# Patient Record
Sex: Male | Born: 1940
Health system: Southern US, Community
[De-identification: ages and names within clinical notes are randomized; demographics above are authoritative.]

## PROBLEM LIST (undated history)

## (undated) DIAGNOSIS — R7989 Other specified abnormal findings of blood chemistry: Secondary | ICD-10-CM

## (undated) DIAGNOSIS — T7840XA Allergy, unspecified, initial encounter: Secondary | ICD-10-CM

## (undated) DIAGNOSIS — E538 Deficiency of other specified B group vitamins: Secondary | ICD-10-CM

## (undated) DIAGNOSIS — B9681 Helicobacter pylori [H. pylori] as the cause of diseases classified elsewhere: Secondary | ICD-10-CM

## (undated) DIAGNOSIS — I482 Chronic atrial fibrillation, unspecified: Secondary | ICD-10-CM

## (undated) DIAGNOSIS — M4800 Spinal stenosis, site unspecified: Secondary | ICD-10-CM

## (undated) DIAGNOSIS — K5790 Diverticulosis of intestine, part unspecified, without perforation or abscess without bleeding: Secondary | ICD-10-CM

## (undated) DIAGNOSIS — I2119 ST elevation (STEMI) myocardial infarction involving other coronary artery of inferior wall: Secondary | ICD-10-CM

## (undated) DIAGNOSIS — R29898 Other symptoms and signs involving the musculoskeletal system: Secondary | ICD-10-CM

## (undated) DIAGNOSIS — N419 Inflammatory disease of prostate, unspecified: Secondary | ICD-10-CM

## (undated) DIAGNOSIS — B029 Zoster without complications: Secondary | ICD-10-CM

## (undated) DIAGNOSIS — K2970 Gastritis, unspecified, without bleeding: Secondary | ICD-10-CM

## (undated) DIAGNOSIS — M109 Gout, unspecified: Secondary | ICD-10-CM

## (undated) DIAGNOSIS — E039 Hypothyroidism, unspecified: Secondary | ICD-10-CM

## (undated) DIAGNOSIS — N529 Male erectile dysfunction, unspecified: Secondary | ICD-10-CM

## (undated) DIAGNOSIS — Z860100 Personal history of colon polyps, unspecified: Secondary | ICD-10-CM

## (undated) DIAGNOSIS — E559 Vitamin D deficiency, unspecified: Secondary | ICD-10-CM

## (undated) DIAGNOSIS — J449 Chronic obstructive pulmonary disease, unspecified: Secondary | ICD-10-CM

## (undated) DIAGNOSIS — H9190 Unspecified hearing loss, unspecified ear: Secondary | ICD-10-CM

## (undated) DIAGNOSIS — I10 Essential (primary) hypertension: Secondary | ICD-10-CM

## (undated) DIAGNOSIS — N200 Calculus of kidney: Secondary | ICD-10-CM

## (undated) DIAGNOSIS — E119 Type 2 diabetes mellitus without complications: Secondary | ICD-10-CM

## (undated) DIAGNOSIS — F419 Anxiety disorder, unspecified: Secondary | ICD-10-CM

## (undated) DIAGNOSIS — K5190 Ulcerative colitis, unspecified, without complications: Secondary | ICD-10-CM

## (undated) DIAGNOSIS — J45909 Unspecified asthma, uncomplicated: Secondary | ICD-10-CM

## (undated) DIAGNOSIS — E785 Hyperlipidemia, unspecified: Secondary | ICD-10-CM

## (undated) DIAGNOSIS — K219 Gastro-esophageal reflux disease without esophagitis: Secondary | ICD-10-CM

## (undated) DIAGNOSIS — Z951 Presence of aortocoronary bypass graft: Secondary | ICD-10-CM

## (undated) DIAGNOSIS — J8410 Pulmonary fibrosis, unspecified: Secondary | ICD-10-CM

## (undated) DIAGNOSIS — M5416 Radiculopathy, lumbar region: Secondary | ICD-10-CM

## (undated) DIAGNOSIS — I2510 Atherosclerotic heart disease of native coronary artery without angina pectoris: Secondary | ICD-10-CM

## (undated) DIAGNOSIS — IMO0002 Reserved for concepts with insufficient information to code with codable children: Secondary | ICD-10-CM

## (undated) DIAGNOSIS — R945 Abnormal results of liver function studies: Secondary | ICD-10-CM

## (undated) DIAGNOSIS — D509 Iron deficiency anemia, unspecified: Secondary | ICD-10-CM

## (undated) DIAGNOSIS — J42 Unspecified chronic bronchitis: Secondary | ICD-10-CM

## (undated) DIAGNOSIS — K2270 Barrett's esophagus without dysplasia: Secondary | ICD-10-CM

## (undated) DIAGNOSIS — Z9861 Coronary angioplasty status: Secondary | ICD-10-CM

## (undated) HISTORY — DX: Unspecified hearing loss, unspecified ear: H91.90

## (undated) HISTORY — DX: Hypothyroidism, unspecified: E03.9

## (undated) HISTORY — DX: Diverticulosis of intestine, part unspecified, without perforation or abscess without bleeding: K57.90

## (undated) HISTORY — DX: Pulmonary fibrosis, unspecified: J84.10

## (undated) HISTORY — DX: Barrett's esophagus without dysplasia: K22.70

## (undated) HISTORY — DX: Hyperlipidemia, unspecified: E78.5

## (undated) HISTORY — DX: Zoster without complications: B02.9

## (undated) HISTORY — DX: Coronary angioplasty status: Z98.61

## (undated) HISTORY — PX: BLEPHAROPLASTY: SUR158

## (undated) HISTORY — DX: Gout, unspecified: M10.9

## (undated) HISTORY — DX: Atherosclerotic heart disease of native coronary artery without angina pectoris: I25.10

## (undated) HISTORY — DX: Calculus of kidney: N20.0

## (undated) HISTORY — DX: Unspecified asthma, uncomplicated: J45.909

## (undated) HISTORY — DX: Other symptoms and signs involving the musculoskeletal system: R29.898

## (undated) HISTORY — DX: Personal history of colon polyps, unspecified: Z86.0100

## (undated) HISTORY — DX: Reserved for concepts with insufficient information to code with codable children: IMO0002

## (undated) HISTORY — DX: Radiculopathy, lumbar region: M54.16

## (undated) HISTORY — DX: Ulcerative colitis, unspecified, without complications: K51.90

## (undated) HISTORY — DX: Essential (primary) hypertension: I10

## (undated) HISTORY — PX: COLONOSCOPY: SHX174

## (undated) HISTORY — DX: Iron deficiency anemia, unspecified: D50.9

## (undated) HISTORY — DX: Other specified abnormal findings of blood chemistry: R79.89

## (undated) HISTORY — DX: Anxiety disorder, unspecified: F41.9

## (undated) HISTORY — DX: ST elevation (STEMI) myocardial infarction involving other coronary artery of inferior wall: I21.19

## (undated) HISTORY — DX: Chronic atrial fibrillation, unspecified: I48.20

## (undated) HISTORY — DX: Abnormal results of liver function studies: R94.5

## (undated) HISTORY — PX: CHOLECYSTECTOMY: SHX55

## (undated) HISTORY — DX: Vitamin D deficiency, unspecified: E55.9

## (undated) HISTORY — PX: FLEXIBLE SIGMOIDOSCOPY: SHX1649

## (undated) HISTORY — DX: Male erectile dysfunction, unspecified: N52.9

## (undated) HISTORY — DX: Spinal stenosis, site unspecified: M48.00

## (undated) HISTORY — PX: ESOPHAGOGASTRODUODENOSCOPY: SHX1529

## (undated) HISTORY — DX: Helicobacter pylori (H. pylori) as the cause of diseases classified elsewhere: B96.81

## (undated) HISTORY — DX: Personal history of colonic polyps: Z86.010

## (undated) HISTORY — DX: Gastro-esophageal reflux disease without esophagitis: K21.9

## (undated) HISTORY — DX: Presence of aortocoronary bypass graft: Z95.1

## (undated) HISTORY — DX: Allergy, unspecified, initial encounter: T78.40XA

## (undated) HISTORY — DX: Type 2 diabetes mellitus without complications: E11.9

## (undated) HISTORY — DX: Deficiency of other specified B group vitamins: E53.8

## (undated) HISTORY — DX: Chronic obstructive pulmonary disease, unspecified: J44.9

## (undated) HISTORY — DX: Inflammatory disease of prostate, unspecified: N41.9

## (undated) HISTORY — DX: Unspecified chronic bronchitis: J42

## (undated) HISTORY — DX: Gastritis, unspecified, without bleeding: K29.70

---

## 1985-07-20 DIAGNOSIS — I2510 Atherosclerotic heart disease of native coronary artery without angina pectoris: Secondary | ICD-10-CM

## 1985-07-20 DIAGNOSIS — Z9861 Coronary angioplasty status: Secondary | ICD-10-CM

## 1985-07-20 HISTORY — DX: Atherosclerotic heart disease of native coronary artery without angina pectoris: Z98.61

## 1985-07-20 HISTORY — DX: Atherosclerotic heart disease of native coronary artery without angina pectoris: I25.10

## 1985-10-30 DIAGNOSIS — I2119 ST elevation (STEMI) myocardial infarction involving other coronary artery of inferior wall: Secondary | ICD-10-CM

## 1995-07-21 DIAGNOSIS — Z951 Presence of aortocoronary bypass graft: Secondary | ICD-10-CM

## 1995-07-21 HISTORY — DX: Presence of aortocoronary bypass graft: Z95.1

## 1995-11-18 HISTORY — PX: CORONARY ARTERY BYPASS GRAFT: SHX141

## 1995-11-18 HISTORY — PX: CARDIAC CATHETERIZATION: SHX172

## 1996-02-16 ENCOUNTER — Encounter: Payer: Self-pay | Admitting: Gastroenterology

## 2000-04-28 ENCOUNTER — Encounter (INDEPENDENT_AMBULATORY_CARE_PROVIDER_SITE_OTHER): Payer: Self-pay | Admitting: Specialist

## 2000-04-28 ENCOUNTER — Other Ambulatory Visit: Admission: RE | Admit: 2000-04-28 | Discharge: 2000-04-28 | Payer: Self-pay | Admitting: Gastroenterology

## 2001-01-12 ENCOUNTER — Encounter: Payer: Self-pay | Admitting: Cardiology

## 2001-01-12 ENCOUNTER — Ambulatory Visit (HOSPITAL_COMMUNITY): Admission: RE | Admit: 2001-01-12 | Discharge: 2001-01-12 | Payer: Self-pay | Admitting: Cardiology

## 2003-06-01 ENCOUNTER — Encounter: Admission: RE | Admit: 2003-06-01 | Discharge: 2003-06-01 | Payer: Self-pay | Admitting: Family Medicine

## 2003-07-03 ENCOUNTER — Encounter: Payer: Self-pay | Admitting: Cardiology

## 2003-07-03 HISTORY — PX: NM MYOCAR PERF EJECTION FRACTION: HXRAD630

## 2003-07-21 HISTORY — PX: NM MYOVIEW LTD: HXRAD82

## 2003-08-13 ENCOUNTER — Encounter: Admission: RE | Admit: 2003-08-13 | Discharge: 2003-08-13 | Payer: Self-pay | Admitting: Cardiology

## 2003-08-17 ENCOUNTER — Encounter: Payer: Self-pay | Admitting: Cardiology

## 2003-08-17 ENCOUNTER — Ambulatory Visit (HOSPITAL_COMMUNITY): Admission: RE | Admit: 2003-08-17 | Discharge: 2003-08-17 | Payer: Self-pay | Admitting: Cardiology

## 2003-08-17 HISTORY — PX: CARDIAC CATHETERIZATION: SHX172

## 2004-06-26 ENCOUNTER — Ambulatory Visit: Payer: Self-pay | Admitting: Gastroenterology

## 2004-10-16 ENCOUNTER — Ambulatory Visit (HOSPITAL_COMMUNITY): Admission: RE | Admit: 2004-10-16 | Discharge: 2004-10-16 | Payer: Self-pay | Admitting: Ophthalmology

## 2004-12-23 ENCOUNTER — Ambulatory Visit (HOSPITAL_COMMUNITY): Admission: RE | Admit: 2004-12-23 | Discharge: 2004-12-23 | Payer: Self-pay | Admitting: Ophthalmology

## 2005-08-04 ENCOUNTER — Ambulatory Visit: Payer: Self-pay | Admitting: Gastroenterology

## 2005-08-12 ENCOUNTER — Ambulatory Visit: Payer: Self-pay | Admitting: Gastroenterology

## 2005-08-12 ENCOUNTER — Encounter (INDEPENDENT_AMBULATORY_CARE_PROVIDER_SITE_OTHER): Payer: Self-pay | Admitting: *Deleted

## 2007-02-24 ENCOUNTER — Ambulatory Visit: Payer: Self-pay | Admitting: Gastroenterology

## 2007-07-21 DIAGNOSIS — B029 Zoster without complications: Secondary | ICD-10-CM

## 2007-07-21 HISTORY — DX: Zoster without complications: B02.9

## 2007-10-31 DIAGNOSIS — D509 Iron deficiency anemia, unspecified: Secondary | ICD-10-CM

## 2007-10-31 DIAGNOSIS — E118 Type 2 diabetes mellitus with unspecified complications: Secondary | ICD-10-CM | POA: Insufficient documentation

## 2007-10-31 DIAGNOSIS — I10 Essential (primary) hypertension: Secondary | ICD-10-CM | POA: Insufficient documentation

## 2007-10-31 DIAGNOSIS — M109 Gout, unspecified: Secondary | ICD-10-CM

## 2007-10-31 DIAGNOSIS — K219 Gastro-esophageal reflux disease without esophagitis: Secondary | ICD-10-CM

## 2007-10-31 DIAGNOSIS — E119 Type 2 diabetes mellitus without complications: Secondary | ICD-10-CM | POA: Insufficient documentation

## 2008-02-28 ENCOUNTER — Emergency Department (HOSPITAL_COMMUNITY): Admission: EM | Admit: 2008-02-28 | Discharge: 2008-02-28 | Payer: Self-pay | Admitting: Emergency Medicine

## 2008-03-09 ENCOUNTER — Ambulatory Visit: Payer: Self-pay | Admitting: Gastroenterology

## 2008-10-23 ENCOUNTER — Ambulatory Visit (HOSPITAL_COMMUNITY): Admission: RE | Admit: 2008-10-23 | Discharge: 2008-10-23 | Payer: Self-pay | Admitting: Family Medicine

## 2008-12-03 ENCOUNTER — Encounter: Payer: Self-pay | Admitting: Cardiology

## 2009-01-25 ENCOUNTER — Ambulatory Visit: Payer: Self-pay | Admitting: Gastroenterology

## 2009-01-29 LAB — CONVERTED CEMR LAB
ALT: 26 units/L (ref 0–53)
AST: 29 units/L (ref 0–37)
Albumin: 4.3 g/dL (ref 3.5–5.2)
Alkaline Phosphatase: 45 units/L (ref 39–117)
Basophils Absolute: 0 10*3/uL (ref 0.0–0.1)
Basophils Relative: 0.6 % (ref 0.0–3.0)
Bilirubin, Direct: 0.1 mg/dL (ref 0.0–0.3)
Eosinophils Absolute: 0.1 10*3/uL (ref 0.0–0.7)
Eosinophils Relative: 0.8 % (ref 0.0–5.0)
Ferritin: 82.5 ng/mL (ref 22.0–322.0)
Folate: 15.5 ng/mL
HCT: 44.3 % (ref 39.0–52.0)
Hemoglobin: 15.4 g/dL (ref 13.0–17.0)
Iron: 154 ug/dL (ref 42–165)
Lymphocytes Relative: 26 % (ref 12.0–46.0)
Lymphs Abs: 2 10*3/uL (ref 0.7–4.0)
MCHC: 34.8 g/dL (ref 30.0–36.0)
MCV: 88.3 fL (ref 78.0–100.0)
Monocytes Absolute: 0.6 10*3/uL (ref 0.1–1.0)
Monocytes Relative: 8.3 % (ref 3.0–12.0)
Neutro Abs: 5 10*3/uL (ref 1.4–7.7)
Neutrophils Relative %: 64.3 % (ref 43.0–77.0)
Platelets: 187 10*3/uL (ref 150.0–400.0)
RBC: 5.02 M/uL (ref 4.22–5.81)
RDW: 14.2 % (ref 11.5–14.6)
Saturation Ratios: 46.1 % (ref 20.0–50.0)
Sed Rate: 5 mm/hr (ref 0–22)
Total Bilirubin: 1.1 mg/dL (ref 0.3–1.2)
Total Protein: 7.3 g/dL (ref 6.0–8.3)
Transferrin: 238.5 mg/dL (ref 212.0–360.0)
Vitamin B-12: 190 pg/mL — ABNORMAL LOW (ref 211–911)
WBC: 7.7 10*3/uL (ref 4.5–10.5)

## 2009-01-30 ENCOUNTER — Ambulatory Visit: Payer: Self-pay | Admitting: Gastroenterology

## 2009-01-30 DIAGNOSIS — E538 Deficiency of other specified B group vitamins: Secondary | ICD-10-CM

## 2009-02-01 ENCOUNTER — Telehealth: Payer: Self-pay | Admitting: Gastroenterology

## 2009-02-04 ENCOUNTER — Telehealth: Payer: Self-pay | Admitting: Gastroenterology

## 2009-02-07 ENCOUNTER — Ambulatory Visit: Payer: Self-pay | Admitting: Gastroenterology

## 2009-02-14 ENCOUNTER — Ambulatory Visit: Payer: Self-pay | Admitting: Gastroenterology

## 2009-02-27 ENCOUNTER — Encounter: Payer: Self-pay | Admitting: Gastroenterology

## 2009-02-27 ENCOUNTER — Ambulatory Visit: Payer: Self-pay | Admitting: Gastroenterology

## 2009-03-01 ENCOUNTER — Encounter: Payer: Self-pay | Admitting: Gastroenterology

## 2009-03-18 ENCOUNTER — Ambulatory Visit: Payer: Self-pay | Admitting: Gastroenterology

## 2009-04-18 ENCOUNTER — Ambulatory Visit: Payer: Self-pay | Admitting: Gastroenterology

## 2009-05-16 ENCOUNTER — Ambulatory Visit: Payer: Self-pay | Admitting: Gastroenterology

## 2009-06-17 ENCOUNTER — Ambulatory Visit: Payer: Self-pay | Admitting: Gastroenterology

## 2009-07-17 ENCOUNTER — Ambulatory Visit: Payer: Self-pay | Admitting: Gastroenterology

## 2009-07-20 DIAGNOSIS — B9681 Helicobacter pylori [H. pylori] as the cause of diseases classified elsewhere: Secondary | ICD-10-CM

## 2009-07-20 HISTORY — DX: Helicobacter pylori (H. pylori) as the cause of diseases classified elsewhere: B96.81

## 2009-08-19 ENCOUNTER — Ambulatory Visit: Payer: Self-pay | Admitting: Gastroenterology

## 2009-09-16 ENCOUNTER — Ambulatory Visit: Payer: Self-pay | Admitting: Gastroenterology

## 2009-10-14 ENCOUNTER — Ambulatory Visit: Payer: Self-pay | Admitting: Gastroenterology

## 2009-11-14 ENCOUNTER — Ambulatory Visit: Payer: Self-pay | Admitting: Gastroenterology

## 2009-12-09 ENCOUNTER — Ambulatory Visit: Payer: Self-pay | Admitting: Gastroenterology

## 2009-12-23 ENCOUNTER — Encounter: Payer: Self-pay | Admitting: Cardiology

## 2009-12-23 HISTORY — PX: OTHER SURGICAL HISTORY: SHX169

## 2009-12-24 ENCOUNTER — Encounter: Payer: Self-pay | Admitting: Gastroenterology

## 2009-12-27 ENCOUNTER — Encounter: Payer: Self-pay | Admitting: Gastroenterology

## 2010-01-09 ENCOUNTER — Ambulatory Visit: Payer: Self-pay | Admitting: Gastroenterology

## 2010-02-06 ENCOUNTER — Ambulatory Visit: Payer: Self-pay | Admitting: Gastroenterology

## 2010-02-17 ENCOUNTER — Telehealth: Payer: Self-pay | Admitting: Gastroenterology

## 2010-02-18 ENCOUNTER — Encounter: Payer: Self-pay | Admitting: Gastroenterology

## 2010-03-07 ENCOUNTER — Ambulatory Visit: Payer: Self-pay | Admitting: Gastroenterology

## 2010-08-07 ENCOUNTER — Encounter: Payer: Self-pay | Admitting: Gastroenterology

## 2010-08-21 NOTE — Assessment & Plan Note (Signed)
Summary: MONTHLY B12 SHOT...LSW.  Nurse Visit   Allergies: No Known Drug Allergies  Medication Administration  Injection # 1:    Medication: Vit B12 1000 mcg    Diagnosis: VITAMIN B12 DEFICIENCY (ICD-266.2)    Route: IM    Site: R deltoid    Exp Date: 12/12    Lot #: [id number]    Mfr: American Regent    Patient tolerated injection without complications    Given by: Hortense Ramal CMA (AAMA) (October 14, 2009 1:32 PM)  Orders Added: 1)  Vit B12 1000 mcg [J3420]

## 2010-08-21 NOTE — Assessment & Plan Note (Signed)
Summary: MONTHLY B12 SHOT...LSW.  Nurse Visit   Allergies: No Known Drug Allergies  Medication Administration  Injection # 1:    Medication: Vit B12 1000 mcg    Diagnosis: VITAMIN B12 DEFICIENCY (ICD-266.2)    Route: IM    Site: R deltoid    Exp Date: 2/13    Lot #: [id number]    Mfr: American Regent    Patient tolerated injection without complications    Given by: Lamona Curl CMA (AAMA) (Dec 09, 2009 1:22 PM)  Orders Added: 1)  Vit B12 1000 mcg [J3420]   Medication Administration  Injection # 1:    Medication: Vit B12 1000 mcg    Diagnosis: VITAMIN B12 DEFICIENCY (ICD-266.2)    Route: IM    Site: R deltoid    Exp Date: 2/13    Lot #: [id number]    Mfr: American Regent    Patient tolerated injection without complications    Given by: Lamona Curl CMA (AAMA) (Dec 09, 2009 1:22 PM)  Orders Added: 1)  Vit B12 1000 mcg [J3420]

## 2010-08-21 NOTE — Assessment & Plan Note (Signed)
Summary: MONTHLY B12 SHOT...LSW.  Nurse Visit   Allergies: No Known Drug Allergies  Medication Administration  Injection # 1:    Medication: Vit B12 1000 mcg    Diagnosis: VITAMIN B12 DEFICIENCY (ICD-266.2)    Route: IM    Site: R deltoid    Exp Date: 11/12    Lot #: [id number]    Mfr: American Regent    Patient tolerated injection without complications    Given by: Hortense Ramal CMA (AAMA) (September 16, 2009 1:25 PM)  Orders Added: 1)  Vit B12 1000 mcg [J3420]

## 2010-08-21 NOTE — Assessment & Plan Note (Signed)
Summary: MONTHLY B12 SHOT...LSW.  Nurse Visit   Allergies: No Known Drug Allergies  Medication Administration  Injection # 1:    Medication: Vit B12 1000 mcg    Diagnosis: VITAMIN B12 DEFICIENCY (ICD-266.2)    Route: IM    Site: R deltoid    Exp Date: 10/12    Lot #: [id number]    Mfr: American Regent    Patient tolerated injection without complications    Given by: Awilda Bill CMA Deborra Medina) (August 19, 2009 1:23 PM)  Orders Added: 1)  Vit B12 1000 mcg [Y7062]

## 2010-08-21 NOTE — Assessment & Plan Note (Signed)
Summary: MONTHLY B12 SHOT...LSW.  Nurse Visit   Allergies: No Known Drug Allergies  Medication Administration  Injection # 1:    Medication: Vit B12 1000 mcg    Diagnosis: VITAMIN B12 DEFICIENCY (ICD-266.2)    Route: IM    Site: L deltoid    Exp Date: 08/2011    Lot #: [id number]    Mfr: American Regent    Patient tolerated injection without complications    Given by: Marlon Pel CMA ([place]) (February 06, 2010 10:01 AM)  Orders Added: 1)  Vit B12 1000 mcg [E7035]

## 2010-08-21 NOTE — Assessment & Plan Note (Signed)
Summary: MONTHLY B12 SHOT...LSW.  Nurse Visit   Allergies: No Known Drug Allergies  Medication Administration  Injection # 1:    Medication: Vit B12 1000 mcg    Diagnosis: VITAMIN B12 DEFICIENCY (ICD-266.2)    Route: IM    Site: L deltoid    Exp Date: 08/2011    Lot #: [id number]    Mfr: American Regent    Patient tolerated injection without complications    Given by: Merri Ray CMA Duncan Dull) (November 14, 2009 1:38 PM)  Orders Added: 1)  Vit B12 1000 mcg [J3420]

## 2010-08-21 NOTE — Letter (Signed)
Summary: Colonoscopy Date Change Letter  [facility]  [address]   [place], [place] [address]   Phone: [phone]  Fax: [phone]      August 07, 2010 MRN: [id number]   [place], [place]  [address]   Dear Mr. CALVEY,   Previously you were recommended to have a repeat colonoscopy around this time. Your chart was recently reviewed by Dr.Patterson of [facility]. Follow up colonoscopy is now recommended in August 2013. This revised recommendation is based on current, nationally recognized guidelines for colorectal cancer screening and polyp surveillance. These guidelines are endorsed by the [place] Task Force on Colorectal Cancer as well as numerous other major medical organizations.  Please understand that our recommendation assumes that you do not have any new symptoms such as bleeding, a change in bowel habits, anemia, or significant abdominal discomfort. If you do have any concerning GI symptoms or want to discuss the guideline recommendations, please call to arrange an office visit at your earliest convenience. Otherwise we will keep you in our reminder system and contact you 1-2 months prior to the date listed above to schedule your next colonoscopy.  Thank you,   [facility] [phone]

## 2010-08-21 NOTE — Assessment & Plan Note (Signed)
Summary: 1 Yr follow up/ B12 shot/ dfs    History of Present Illness Primary GI MD: Verl Blalock MD [address] Primary Cristofher Livecchi: Ledon Snare, MD Chief Complaint: f/u Ulcerative coliis, denies any sx and pt is due for b-12 injection.  History of Present Illness:   70 year old Caucasian male with chronic laboratory values he is in remission on oral Asacol 800 mg b.i.d. He also has been on 6-MP 50 mg alternating with 25 mg every other day, and been in remission for greater than 5 years. He currently denies any gastrointestinal difficulties. Last colonoscopy was in January 2007 was unremarkable including multiple biopsies for dysplasia. His blood counts have been stable and he is followed closely by Dr. Derinda Late. He also takes omeprazole 40 mg a day for chronic GERD. He denies upper gastrointestinal or hepatobiliary symptoms.   GI Review of Systems      Denies abdominal pain, acid reflux, belching, bloating, chest pain, dysphagia with liquids, dysphagia with solids, heartburn, loss of appetite, nausea, vomiting, vomiting blood, weight loss, and  weight gain.        Denies anal fissure, black tarry stools, change in bowel habit, constipation, diarrhea, diverticulosis, fecal incontinence, heme positive stool, hemorrhoids, irritable bowel syndrome, jaundice, light color stool, liver problems, rectal bleeding, and  rectal pain.    Current Medications (verified): 1)  Mercaptopurine 50 Mg  Tabs (Mercaptopurine) .... Take One Tablet By Mouth Every Other Day Alternating With 1/2 Tabs Every Other Day. 2)  Lipitor 10 Mg  Tabs (Atorvastatin Calcium) .... One By Mouth Once Daily 3)  Adult Aspirin Ec Low Strength 81 Mg  Tbec (Aspirin) .... One By Mouth Once Daily 4)  Metformin Hcl 500 Mg  Tabs (Metformin Hcl) .... Four By Mouth Once Daily 5)  Glipizide-Metformin Hcl 2.5-250 Mg  Tabs (Glipizide-Metformin Hcl) .... One By Mouth Once Daily 6)  Lisinopril 20 Mg  Tabs (Lisinopril) .... 1/2 Tablet By  Mouth Once Daily 7)  Amiodarone Hcl 200 Mg  Tabs (Amiodarone Hcl) .... 1/2 Tablet By Mouth Once Daily 8)  Warfarin Sodium 5 Mg  Tabs (Warfarin Sodium) .... One By Mouth Once Daily 9)  Asacol 400 Mg  Tbec (Mesalamine) .... Take Two By Mouth Two Times A Day 10)  Omeprazole 40 Mg Cpdr (Omeprazole) .... Take One By Mouth Once Daily  Allergies (verified): No Known Drug Allergies  Past History:  Past medical, surgical, family and social histories (including risk factors) reviewed for relevance to current acute and chronic problems.  Past Medical History: Reviewed history from 10/31/2007 and no changes required. Current Problems:  GOUT (ICD-274.9) Hx of MYOCARDIAL INFARCTION (ICD-410.90) ANEMIA, IRON DEFICIENCY (ICD-280.9) HYPERLIPIDEMIA (ICD-272.4) ACID REFLUX DISEASE (ICD-530.81) HYPERTENSION (ICD-401.9) DIABETES MELLITUS (ICD-250.00) ULCERATIVE COLITIS (ICD-556.9)  Past Surgical History: Reviewed history from 10/31/2007 and no changes required. CABG 11/1995 Cholecystectomy  Family History: Reviewed history from 03/09/2008 and no changes required. No FH of Colon Cancer:  Social History: Reviewed history from 03/09/2008 and no changes required. Occupation: Retired Patient is a former smoker. -stopped 1986 Alcohol Use - no Illicit Drug Use - no Patient gets regular exercise.  Review of Systems  The patient denies allergy/sinus, anemia, anxiety-new, arthritis/joint pain, back pain, blood in urine, breast changes/lumps, change in vision, confusion, cough, coughing up blood, depression-new, fainting, fatigue, fever, headaches-new, hearing problems, heart murmur, heart rhythm changes, itching, menstrual pain, muscle pains/cramps, night sweats, nosebleeds, pregnancy symptoms, shortness of breath, skin rash, sleeping problems, sore throat, swelling of feet/legs, swollen lymph glands, thirst -  excessive , urination - excessive , urination changes/pain, urine leakage, vision changes, and  voice change.    Vital Signs:  Patient profile:   70 year old male Height:      72 inches Weight:      217.25 pounds BMI:     29.57 Pulse rate:   70 / minute Pulse rhythm:   regular BP sitting:   118 / 66  (right arm) Cuff size:   regular  Vitals Entered By: Marlon Pel CMA Deborra Medina) (March 07, 2010 10:52 AM)  Physical Exam  General:  Well developed, well nourished, no acute distress.healthy appearing.   Head:  Normocephalic and atraumatic. Eyes:  PERRLA, no icterus.exam deferred to patient's ophthalmologist.   Lungs:  Clear throughout to auscultation. Heart:  Regular rate and rhythm; no murmurs, rubs,  or bruits. Abdomen:  Soft, nontender and nondistended. No masses, hepatosplenomegaly or hernias noted. Normal bowel sounds. Neurologic:  Alert and  oriented x4;  grossly normal neurologically. Psych:  Alert and cooperative. Normal mood and affect.   Impression & Recommendations:  Problem # 1:  ULCERATIVE COLITIS (ICD-556.9) Assessment Improved At This Time I will discontinue his 6-MP since she has been in remission for greater than 5 years. We will continue his oral Asacol for prophylactic purposes. His colonoscopy is due in 2 years time. He is to call if he has a relapse of his inflammatory bowel disease. Otherwise he is to continue his excellent medical care with Dr. Derinda Late.  Problem # 2:  ANEMIA, IRON DEFICIENCY (ICD-280.9) Assessment: Improved  Problem # 3:  VITAMIN B12 DEFICIENCY (ICD-266.2) Assessment: Improved  Problem # 4:  ACID REFLUX DISEASE (ICD-530.81) Assessment: Improved Continue omeprazole 40 mg a day for chronic GERD prevention.  Patient Instructions: 1)  Stop 6MP.  Cont other medications. 2)  The medication list was reviewed and reconciled.  All changed / newly prescribed medications were explained.  A complete medication list was provided to the patient / caregiver. 3)  Copy sent to : Dr. Derinda Late.  Appended Document: 1 Yr follow up/  B12 shot/ dfs He needs followup colonoscopy in January of next year.  Appended Document: 1 Yr follow up/ B12 shot/ dfs    Clinical Lists Changes  Medications: Removed medication of OMEPRAZOLE 40 MG CPDR (OMEPRAZOLE) take one by mouth once daily Added new medication of CYANOCOBALAMIN 1000 MCG/ML INJ SOLN (CYANOCOBALAMIN) 1 cc IM monnthly - Signed Added new medication of MONOJECT SYRINGE 21G X 1" 3 ML MISC (SYRINGE/NEEDLE (DISP)) Inject 1cc B12 Im monthly - Signed Rx of CYANOCOBALAMIN 1000 MCG/ML INJ SOLN (CYANOCOBALAMIN) 1 cc IM monnthly;  #12 x 3;  Signed;  Entered by: Alberteen Spindle RN;  Authorized by: Sable Feil MD [facility];  Method used: [facility] [id number] [address], [place], [place], [place]  [address], Ph: [phone], Fax: [phone] Rx of MONOJECT SYRINGE 21G X 1" 3 ML MISC (SYRINGE/NEEDLE (DISP)) Inject 1cc B12 Im monthly;  #12 x 3;  Signed;  Entered by: Alberteen Spindle RN;  Authorized by: Sable Feil MD [facility];  Method used: Electronically to [id number]*, [address] [place], [place], [place]  [address], Ph: [phone], Fax: [phone] Orders: Added new Service order of Vit B12 1000 mcg (J3420) - Signed    Prescriptions: MONOJECT SYRINGE 21G X 1" 3 ML MISC (SYRINGE/NEEDLE (DISP)) Inject 1cc B12 Im monthly  #12 x 3   Entered by:   Alberteen Spindle  RN   Authorized by:   Sable Feil MD [facility]   Signed by:   Alberteen Spindle RN on 03/07/2010   Method used:   [facility]        [facility] [phone][facility] (retail)       [address]       [place], [place]  [address]       Ph: [phone]       Fax: [phone]   RxID:   [id number] CYANOCOBALAMIN 1000 MCG/ML INJ SOLN (CYANOCOBALAMIN) 1 cc IM monnthly  #12 x 3   Entered by:   Alberteen Spindle RN   Authorized by:   Sable Feil MD [facility]   Signed by:   Alberteen Spindle RN on 03/07/2010   Method used:   [facility]        [facility] [phone][facility] (retail)       [address]       [place],   [address]       Ph: [phone]       Fax: [phone]   RxID:   [id number]     Medication Administration  Injection # 1:    Medication: Vit B12 1000 mcg    Diagnosis: VITAMIN B12 DEFICIENCY (ICD-266.2)    Route: IM    Site: L deltoid    Exp Date: 6/13    Lot #: [id number]    Mfr: American Regent    Patient tolerated injection without complications    Given by: Alberteen Spindle RN (March 07, 2010 12:04 PM)  Orders Added: 1)  Vit B12 1000 mcg [B1660]

## 2010-08-21 NOTE — Assessment & Plan Note (Signed)
Summary: MONTHLY B12 SHOT...LSW.  Nurse Visit   Medication Administration  Injection # 1:    Medication: Vit B12 1000 mcg    Diagnosis: VITAMIN B12 DEFICIENCY (ICD-266.2)    Route: IM    Site: L deltoid    Exp Date: 08/2011    Lot #: [id number]    Mfr: American Regent    Comments: pt will return in one month for next injection    Patient tolerated injection without complications    Given by: Abelino Derrick CMA Deborra Medina) (January 09, 2010 1:25 PM)  Orders Added: 1)  Vit B12 1000 mcg [[id number]]

## 2010-08-21 NOTE — Progress Notes (Signed)
Summary: REfill    Phone Note Call from Patient Call back at [facility] Phone [phone]   Call For: Dr Jarold Motto Reason for Call: Refill Medication Summary of Call: Needs a refill on Asacol & Mercapaturine. Has about 10days worht of medicine. Uses mail order pharmacy called - [facility]  Initial call taken by: Leanor Kail [facility],  February 17, 2010 10:06 AM  Follow-up for Phone Call        Refills sent to [facility] as requested.  OV scheduled. Follow-up by: Ashok Cordia RN,  February 17, 2010 10:30 AM    Prescriptions: ASACOL 400 MG  TBEC (MESALAMINE) take two by mouth two times a day  #360 x 3   Entered by:   Ashok Cordia RN   Authorized by:   Mardella Layman MD [facility]   Signed by:   Ashok Cordia RN on 02/17/2010   Method used:   Electronically to        [facility]* (mail-order)       [address]       [place], [place]  [address]       Ph: [phone]       Fax: [phone]   RxID:   [id number] MERCAPTOPURINE 50 MG  TABS (MERCAPTOPURINE) take one tablet by mouth every other day alternating with 1/2 tabs every other day.  #69 x 3   Entered by:   Ashok Cordia RN   Authorized by:   Mardella Layman MD [facility]   Signed by:   Ashok Cordia RN on 02/17/2010   Method used:   Electronically to        [facility]* (mail-order)       [address]       [place], [place]  [address]       Ph: [phone]       Fax: [phone]   RxID:   [id number]

## 2010-10-26 LAB — GLUCOSE, CAPILLARY
Glucose-Capillary: 122 mg/dL — ABNORMAL HIGH (ref 70–99)
Glucose-Capillary: 136 mg/dL — ABNORMAL HIGH (ref 70–99)

## 2010-10-29 LAB — BLOOD GAS, ARTERIAL
Drawn by: 12206
FIO2: 0.21 %
Patient temperature: 98.6
pCO2 arterial: 33.1 mmHg — ABNORMAL LOW (ref 35.0–45.0)
pH, Arterial: 7.415 (ref 7.350–7.450)

## 2010-12-02 NOTE — Assessment & Plan Note (Signed)
[name] NOTE   [name], [name]                  MRN:          [id number]  DATE:02/24/2007                            DOB:          1940/07/23    Timothy Mclean has no complaints and comes in for a check up with his chronic  ulcerative colitis. He is having regular bowel movements without melena  or hematochezia. He denies any significant cardiopulmonary or general  medical problems since he was last seen. He had colonoscopy in January  of 2007 with dysplasia screening all of which were negative.   He is well controlled on 6MP 50 mg alternating with 25 mg every other  day. A variety of other medications for his diabetes and hypertension  which is managed by Dr. Sandi Mariscal. He says Dr. Sandi Mariscal does blood work  every 6 months and I will request this blood work from Dr. Sandi Mariscal. I  am reluctant to prescribe immunosuppressants without regular laboratory  evaluation.   He weighs 233 pounds and blood pressure is 122/82. Pulse was 68 and  regular.  I could not appreciate stigmata of chronic liver disease.  CHEST: Clear and he was in a regular rhythm without murmurs, gallops, or  rubs.  There was no hepatosplenomegaly, abdominal masses, or tenderness. Bowel  sounds were normal.  Peripheral extremities were unremarkable.  Mental status was normal.   As before, Pat seems to be in remission on 6MP and he also takes  Asacol 800 mg twice a day.   RECOMMENDATIONS:  1. We will ask Dr. Sandi Mariscal to send [place] laboratory data, and continuing      doing every 6 months CBC and liver profiles.  2. Drugs refilled today.  3. Colonoscopy follow up in January.     Loralee Pacas. Sharlett Iles, MD, Quentin Ore, FAGA  Electronically Signed    DRP/MedQ  DD: 02/24/2007  DT: 02/24/2007  Job #: [id number]   cc:   Derinda Late, M.D.

## 2010-12-05 NOTE — Cardiovascular Report (Signed)
NAME:  Timothy Mclean, Timothy Mclean                          ACCOUNT NO.:  [id number]   MEDICAL RECORD NO.:  [id number]                   PATIENT TYPE:  OIB   LOCATION:  2899                                 FACILITY:  [facility]   PHYSICIAN:  Jeanella Craze. Little, M.D.              DATE OF BIRTH:  February 09, 1941   DATE OF PROCEDURE:  08/17/2003  DATE OF DISCHARGE:  08/17/2003                              CARDIAC CATHETERIZATION   INDICATIONS FOR PROCEDURE:  Timothy Mclean is a 70 year old male who had bypass  surgery in 1997. As part of a routine screening he had a Cardiolite study  performed on July 03, 2003, which showed a 50% ejection fraction but  mild to moderate lateral  ischemia. Because of this he is brought  in for  outpatient cardiac catheterization with graft  visualization.   DESCRIPTION OF PROCEDURE:  The patient was prepped and draped in the usual  sterile fashion exposing the right groin. Following local anesthetic with 1%  Xylocaine, the Seldinger technique was employed and a 5 [place] introducer  sheath was placed into the right femoral artery. Left and right coronary  arteriography graft  visualization using the right coronary catheter,  ventriculography and a distal aortogram were performed.   COMPLICATIONS:  None   EQUIPMENT:  A 5 French Judkins configuration catheters. A 5 cm curved left  Judkins catheter was used to cannulate the left main. The internal mammary  artery and  the bypass grafts were all cannulated with  a 5 French, 4 cm  curved Judkins catheter.   RESULTS:  1. Hemodynamic monitoring:  Central aortic pressure is 158/85, left     ventricular pressure is 164/11 with no significant gradient noted at the     time of pullback.  2. Ventriculography:  In the RAO projection using 25 mL at 12 mL per second     revealed normal left ventricular systolic function with an ejection     fraction of 50% to 55%. The end diastolic pressure was normal at 13.  3. Distal aortogram:  A distal  aortogram was performed because there was     hangup of the J-wire as it passed through the bifurcation of the aorta.     Then 30 mL of contrast at 15 mL were injected just above the level of the     renal arteries. The renal arteries showed no evidence of renal artery     stenosis. There was mild irregularities of the terminal aorta above the     bifurcation and there was mild calcification at the bifurcation, but no     occlusive plaques were noted. The iliacs were widely patent.  4. Coronary arteriography:     A. Left main normal.     B. Left anterior descending artery:  The LAD was 100% occluded after the        1st diagonal. The 1st diagonal was widely patent and  the proximal        portion of the LAD which led to the 1st diagonal was also widely        patent. There was a septal perforator that came off the proximal        portion of the LAD which had an 80% area of proximal narrowing. This        vessel is not amenable to any type intervention.     C. Circumflex:  The circumflex itself was patent, but all the OM vessels        were occluded.     D. Right coronary artery:  The right coronary artery was 100% occluded        proximally.   GRAFTS:  1. Saphenous vein to the posterior descending artery:  The graft  was widely     patent. The PDA was widely patent and there was retrograde filling of the     PDA into the posterolateral vessel. This was all free of disease.  2. Saphenous vein graft to the obtuse marginal:  The graft was widely     patent. The OM itself was small  and free of disease.  3. Internal mammary artery to the left anterior descending artery:  The     internal mammary artery was widely patent. The LAD from the midportion     down around the apex of the heart  was widely patent.   CONCLUSION:  1. Widely patent bypass grafts.  2. Normal left ventricular systolic function at 72% to 55%.  3. Minimal irregularities in the terminal aorta.   DISCUSSION:  It appears  that the stress test was a false positive study.  This patient clearly does not have significant occlusive disease, and  with  widely patent grafts medical therapy will be continued.                                               Jeanella Craze. Little, M.D.    ABL/MEDQ  D:  08/17/2003  T:  08/18/2003  Job:  [id number]   cc:   Derinda Late, M.D.  [place]  [place] [address]  Fax: [phone]   Cath Lab

## 2010-12-05 NOTE — Op Note (Signed)
NAMEEFFREY, DAVIDOW                ACCOUNT NO.:  [id number]   MEDICAL RECORD NO.:  [id number]          PATIENT TYPE:  OIB   LOCATION:  2873                         FACILITY:  [facility]   PHYSICIAN:  Robert L. Katy Fitch, M.D.  DATE OF BIRTH:  November 04, 1940   DATE OF PROCEDURE:  12/23/2004  DATE OF DISCHARGE:                                 OPERATIVE REPORT   MINOR SURGERY ROOM.   INDICATIONS AND JUSTIFICATIONS FOR PROCEDURE:  Mr. Baranek has been followed  in my office and was seen after having had his right ptosis repaired.  He  was seen through the referral of Dr. Derinda Late.  When seen initially,  he had a moderately severe ptosis bilaterally, with the right worse than the  left, and good levator function.  The margin reflex distance was 0 mm on the  right and about 1 mm or so on the left.  Pressure was 14 in the right eye  and 16 in the left, and his vision was best corrected to 20/25 in the right  and 20/30- in the left.  The pupil's motility, conjunctiva, cornea, anterior  chamber, and dilated fundus exams were negative, and there was no diabetic  retinopathy.  At the slit lamp, he did have some nuclear cataracts.  The  eyelids have been described, and photographs were taken to document this.  Visual fields were performed with the lids were not taped and this shows an  enormous loss of the superior field caused by the ptosis.  He did nicely  with his right ptosis repair and now comes back to have the left ptosis  repaired.  The reason for this is to open up his visual fields so he can see  better.  Medically, he should be stable for this.   JUSTIFICATION FOR PERFORMING PROCEDURE IN OUTPATIENT SETTING:  Routine.   JUSTIFICATION FOR A PROLONGED STAY:  None.   PREOPERATIVE DIAGNOSIS:  Moderately severe ptosis of the left eye.   POSTOPERATIVE DIAGNOSIS:  Moderately severe ptosis of the left eye.   OPERATION PERFORMED:  Left ptosis repair.   SURGEON:  Robert L. Groat, MD.   ANESTHESIA:  1% Xylocaine with epinephrine.   PROCEDURE:  The patient arrived in the minor surgery room and was prepped  and draped.  A frontal nerve block with 1% Xylocaine and epinephrine was  given, and the skin of the upper lid was also infiltrated somewhat.  The  eyelid was then everted and Cook hemostats were used to clamp about 3 mm of  the superior tarsal rim along with some conjunctiva and muscle tissue.  A  stab incision was made temporally and a running 6-0 chromic gut suture was  run from a temporal to a nasal direction.  The clamps were then removed, and  the tissue that had been clamped was excised.  The suture was then run back  to the starting position and was tied so that the knot was buried.  Polysporin ointment was used, and the eye was patched.  The patient then  left the minor surgery room, having done nicely.  FOLLOWUP CARE:  The patient is to come in, if necessary, if the eye is too  scratchy.  He is to be seen otherwise in about 10 days.      RLG/MEDQ  D:  12/23/2004  T:  12/23/2004  Job:  [id number]

## 2010-12-05 NOTE — Op Note (Signed)
Timothy Mclean, Timothy Mclean                ACCOUNT NO.:  [id number]   MEDICAL RECORD NO.:  [id number]          PATIENT TYPE:  OIB   LOCATION:  2899                         FACILITY:  [facility]   PHYSICIAN:  Robert L. Katy Fitch, M.D.  DATE OF BIRTH:  August 22, 1940   DATE OF PROCEDURE:  10/16/2004  DATE OF DISCHARGE:                                 OPERATIVE REPORT   INDICATIONS AND JUSTIFICATIONS FOR THE PROCEDURE:  [name] is a 70-year-  old gentleman seen through the referral of Derinda Late, M.D., with  moderately severe bilateral ptosis.  He has been diabetic since 1996 and had  bypass surgery in 1997.  He reports that he has had eyelids drooping for  many years and that really nothing is different, except the eyelids have  been drooping worse over the last three or four years.  Examination shows  the pressure is 14 in the right and 16 in the left.  Visual field testing  shows the superior field is reduced based on the upper eyelid level.  He can  be refracted to 20/25 in the right eye and 20/30 in the left and has early  cataracts.  Pupils, mobility, conjunctival, cornea, anterior chamber and  dilated fundus exam does show no diabetic retinopathy.  At slit lamp, he has  moderately early cataracts.  His eyelids do show that the margin reflex  distance is basically 0 in the right and 1-0 in the left.  The plan is to  perform a right ptosis repair since he has symptoms and can see better when  he is holding his eyelids up.  This has been confirmed with photographs and  visual field testing.  Actually when the eyelids are taped compared to when  they are not taped, there is approximately 40-50 more degrees of superior  field.   JUSTIFICATION FOR PERFORMING THE PROCEDURE IN AN OUTPATIENT SETTING:  Routine.   JUSTIFICATION FOR OVERNIGHT STAY:  None.   PREOPERATIVE DIAGNOSIS:  Severe ptosis on the right.   POSTOPERATIVE DIAGNOSIS:  Severe ptosis on the right.   OPERATION PERFORMED:  Ptosis  repair of the right upper eyelid.   SURGEON:  Robert L. Katy Fitch, M.D.   ANESTHESIA:  1% Xylocaine with epinephrine.   DESCRIPTION OF PROCEDURE:  The patient arrived in the minor surgery room at  [facility] and his right eye was prepped and draped in the routine  fashion.  A frontal nerve block with 1% Xylocaine was given.  The upper  eyelid was everted and the tarsal margin was clamped with two curved  hemostats.  A stab incision was made temporally and a 6-0 chromic gut suture  was run behind the hemostats nasally.  The hemostats were removed and the  tissue that had been clamped was excised.  The suture was run back to the  starting point and tied so that it was buried.  Polysporin ointment was  used.  The patient then left the minor room with the eye patch, having done  well.   FOLLOWUP CARE:  The patient is to remove the patch today if  the eye becomes  too scratchy.  Otherwise he is to remove the patch tomorrow and will use  Polysporin ointment twice daily and warm compresses twice daily.  He is to  be seen in my office in about two weeks to be sure he is doing okay.      RLG/MEDQ  D:  10/16/2004  T:  10/16/2004  Job:  [id number]

## 2011-01-02 ENCOUNTER — Other Ambulatory Visit: Payer: Self-pay | Admitting: Family Medicine

## 2011-01-09 ENCOUNTER — Ambulatory Visit
Admission: RE | Admit: 2011-01-09 | Discharge: 2011-01-09 | Disposition: A | Discharge status: Home / Self Care | Payer: Medicare Other | Source: Ambulatory Visit | Attending: Cardiology | Admitting: Cardiology

## 2011-01-09 ENCOUNTER — Other Ambulatory Visit: Payer: Self-pay | Admitting: Cardiology

## 2011-01-09 DIAGNOSIS — I4891 Unspecified atrial fibrillation: Secondary | ICD-10-CM

## 2011-01-09 DIAGNOSIS — Z01811 Encounter for preprocedural respiratory examination: Secondary | ICD-10-CM

## 2011-01-15 ENCOUNTER — Ambulatory Visit (HOSPITAL_COMMUNITY)
Admission: RE | Admit: 2011-01-15 | Discharge: 2011-01-15 | Disposition: A | Discharge status: Home / Self Care | Payer: Medicare Other | Source: Ambulatory Visit | Attending: Cardiology | Admitting: Cardiology

## 2011-01-15 DIAGNOSIS — Z01810 Encounter for preprocedural cardiovascular examination: Secondary | ICD-10-CM | POA: Insufficient documentation

## 2011-01-15 DIAGNOSIS — Z5320 Procedure and treatment not carried out because of patient's decision for unspecified reasons: Secondary | ICD-10-CM | POA: Insufficient documentation

## 2011-01-15 DIAGNOSIS — I4891 Unspecified atrial fibrillation: Secondary | ICD-10-CM | POA: Insufficient documentation

## 2011-04-17 LAB — PROTIME-INR
INR: 2.1 — ABNORMAL HIGH
Prothrombin Time: 24.7 — ABNORMAL HIGH

## 2011-06-03 ENCOUNTER — Encounter: Payer: Self-pay | Admitting: Cardiology

## 2011-08-03 ENCOUNTER — Other Ambulatory Visit: Payer: Self-pay | Admitting: Family Medicine

## 2011-08-03 ENCOUNTER — Ambulatory Visit
Admission: RE | Admit: 2011-08-03 | Discharge: 2011-08-03 | Disposition: A | Discharge status: Home / Self Care | Payer: Medicare Other | Source: Ambulatory Visit | Attending: Family Medicine | Admitting: Family Medicine

## 2011-08-03 DIAGNOSIS — M79604 Pain in right leg: Secondary | ICD-10-CM

## 2011-08-03 DIAGNOSIS — M545 Low back pain: Secondary | ICD-10-CM

## 2011-08-03 DIAGNOSIS — R531 Weakness: Secondary | ICD-10-CM

## 2011-08-07 ENCOUNTER — Other Ambulatory Visit: Payer: Self-pay | Admitting: Family Medicine

## 2011-08-07 DIAGNOSIS — M79609 Pain in unspecified limb: Secondary | ICD-10-CM

## 2011-08-13 ENCOUNTER — Ambulatory Visit
Admission: RE | Admit: 2011-08-13 | Discharge: 2011-08-13 | Disposition: A | Discharge status: Home / Self Care | Payer: Medicare Other | Source: Ambulatory Visit | Attending: Family Medicine | Admitting: Family Medicine

## 2011-08-13 DIAGNOSIS — M79609 Pain in unspecified limb: Secondary | ICD-10-CM

## 2011-08-20 ENCOUNTER — Other Ambulatory Visit: Payer: Self-pay | Admitting: Family Medicine

## 2011-08-20 DIAGNOSIS — IMO0002 Reserved for concepts with insufficient information to code with codable children: Secondary | ICD-10-CM

## 2011-08-20 DIAGNOSIS — M4800 Spinal stenosis, site unspecified: Secondary | ICD-10-CM

## 2011-08-24 ENCOUNTER — Other Ambulatory Visit: Payer: Self-pay | Admitting: Gastroenterology

## 2011-08-24 MED ORDER — MESALAMINE 400 MG PO TBEC: 800.0000 mg | DELAYED_RELEASE_TABLET | Freq: Two times a day (BID) | ORAL | Status: DC

## 2011-08-24 NOTE — Telephone Encounter (Signed)
One month supply sent to pts pharmacy until office visit. And ocne he comes to ov we will sent a year supply to mail order, wife advised.

## 2011-08-28 ENCOUNTER — Ambulatory Visit
Admission: RE | Admit: 2011-08-28 | Discharge: 2011-08-28 | Disposition: A | Discharge status: Home / Self Care | Payer: Medicare Other | Source: Ambulatory Visit | Attending: Family Medicine | Admitting: Family Medicine

## 2011-08-28 DIAGNOSIS — M4800 Spinal stenosis, site unspecified: Secondary | ICD-10-CM

## 2011-08-28 DIAGNOSIS — IMO0002 Reserved for concepts with insufficient information to code with codable children: Secondary | ICD-10-CM

## 2011-09-09 ENCOUNTER — Encounter: Payer: Self-pay | Admitting: *Deleted

## 2011-09-10 ENCOUNTER — Ambulatory Visit (INDEPENDENT_AMBULATORY_CARE_PROVIDER_SITE_OTHER): Payer: Medicare Other | Admitting: Gastroenterology

## 2011-09-10 ENCOUNTER — Encounter: Payer: Self-pay | Admitting: Gastroenterology

## 2011-09-10 DIAGNOSIS — K219 Gastro-esophageal reflux disease without esophagitis: Secondary | ICD-10-CM

## 2011-09-10 DIAGNOSIS — Z860100 Personal history of colon polyps, unspecified: Secondary | ICD-10-CM | POA: Insufficient documentation

## 2011-09-10 DIAGNOSIS — K5190 Ulcerative colitis, unspecified, without complications: Secondary | ICD-10-CM | POA: Insufficient documentation

## 2011-09-10 DIAGNOSIS — Z7901 Long term (current) use of anticoagulants: Secondary | ICD-10-CM | POA: Insufficient documentation

## 2011-09-10 MED ORDER — OMEPRAZOLE 40 MG PO CPDR: 40.0000 mg | DELAYED_RELEASE_CAPSULE | Freq: Every day | ORAL | Status: DC

## 2011-09-10 MED ORDER — MESALAMINE 400 MG PO TBEC: 800.0000 mg | DELAYED_RELEASE_TABLET | Freq: Two times a day (BID) | ORAL | Status: DC

## 2011-09-10 MED ORDER — PEG-KCL-NACL-NASULF-NA ASC-C 100 G PO SOLR: 1.0000 | Freq: Once | ORAL | Status: DC

## 2011-09-10 NOTE — Progress Notes (Signed)
This is a very pleasant 71 year old Caucasian male that I have followed for 30 years for his ulcerative colitis. For many years he was treated with 6 mercaptopurine, and he has now been off of this medicine for one year and is on Asacol 800 mg twice a day. Last colonoscopy with dysplasia screening was 2-1/2 years ago. He also has chronic GERD managed with daily Prilosec 40 mg. Last endoscopy was over 10 years ago. He currently is asymptomatic in terms of chest pain, dysphagia, and he denies hepatobiliary or systemic complaints, has regular bowel movements without melena or hematochezia. He does have atherosclerosis and cardiovascular disease, and is on multiple medications including Coumadin 5 mg a day. He also has adult onset diabetes and is on Glucophage 500 mg once a day and Metaglip 2.5-250 daily. Also for his BPH he uses Proscar. He is chronically on Coumadin therapy.  Current Medications, Allergies, Past Medical History, Past Surgical History, Family History and Social History were reviewed in [employer] record.  Pertinent Review of Systems Negative... occasional ectopic beats but no history of atrial fibrillation or CVAs. He is followed closely by Dr. Derinda Late. Patient denies any current angina, atypical chest pain, symptoms of CHF.  No Known Allergies Outpatient Prescriptions Prior to Visit  Medication Sig Dispense Refill  . amiodarone (PACERONE) 200 MG tablet Take 100 mg by mouth daily.      [name] aspirin 81 MG tablet Take 81 mg by mouth daily.      [name] atorvastatin (LIPITOR) 20 MG tablet Take 20 mg by mouth daily.      [name] glipizide-metformin (METAGLIP) 2.5-250 MG per tablet Take 1 tablet by mouth daily.      [name] lisinopril (PRINIVIL,ZESTRIL) 20 MG tablet Take 10 mg by mouth daily.      . mesalamine (ASACOL) 400 MG EC tablet Take 2 tablets (800 mg total) by mouth 2 (two) times daily.  120 tablet  0  . metFORMIN (GLUCOPHAGE) 500 MG tablet Take four tablet by mouth once a  day      . omeprazole (PRILOSEC) 40 MG capsule Take 40 mg by mouth daily.      [name] warfarin (COUMADIN) 5 MG tablet Take 5 mg by mouth daily.       Past Medical History  Diagnosis Date  . Gout, unspecified   . Acute myocardial infarction, unspecified site, episode of care unspecified   . Iron deficiency anemia, unspecified   . Other and unspecified hyperlipidemia   . Esophageal reflux   . Unspecified essential hypertension   . Type II or unspecified type diabetes mellitus without mention of complication, not stated as uncontrolled   . Ulcerative colitis, unspecified   . Other B-complex deficiencies   . Personal history of colonic polyps    Past Surgical History  Procedure Date  . Coronary artery bypass graft 11/1995  . Cholecystectomy    History   Social History  . Marital Status: Married    Spouse Name: N/A    Number of Children: 1  . Years of Education: N/A   Occupational History  . retired    Social History Main Topics  . Smoking status: Former Smoker -- 1.0 packs/day for 32 years    Types: Cigarettes    Quit date: 08/27/1984  . Smokeless tobacco: Never Used  . Alcohol Use: No  . Drug Use: No  . Sexually Active: None   Other Topics Concern  . None   Social History Narrative  . None  No family history on file.     Physical Exam: Healthy-appearing male appearing younger than his stated age. Blood pressure today 116/60 and pulse was 80 and regular. PMI is 28.7 8, I cannot appreciate stigmata of chronic liver disease. Chest is clear and he appears to be in a regular rhythm without murmurs gallops or rubs. There is no organomegaly, abdominal masses or focal tenderness. Bowel sounds are normal. Mental status is normal and peripheral extremities show no edema, phlebitis, or swollen joints.    Assessment and Plan: Chronic ulcerative colitis in remission on oral aminosialyicate  therapy at standard doses. He has been off of 6-MP for one year, and I scheduled a  colonoscopy to reassess his disease, and to do dysplasia screening because of the chronicity of his colitis making him high risk for colon cancer. At the time of his colonoscopy with propofol sedation, I also will perform endoscopy to exclude Barrett's mucosa for his chronic acid reflux symptoms. I have renewed his Prilosec and Asacol today. Discontinue all other medications as per primary care. I will not stop his Coumadin for these procedures which are low risk for bleeding. No diagnosis found.

## 2011-09-10 NOTE — Patient Instructions (Signed)
Your procedure has been scheduled for 10/07/2011, please follow the seperate instructions.  Your Asacol and Prilosec have been sent to your mail order and your Moviprep have been sent to your local pharmacy. We will advise you about holding your Coumadin once we hear back from Dr. Rex Kras.

## 2011-09-14 ENCOUNTER — Encounter: Payer: Self-pay | Admitting: *Deleted

## 2011-09-30 ENCOUNTER — Telehealth: Payer: Self-pay | Admitting: *Deleted

## 2011-09-30 NOTE — Telephone Encounter (Signed)
Pt aware he is to hold coumadin 4 days before and asa 5 days before and restart after procedure. Patient verbalized understanding.

## 2011-09-30 NOTE — Telephone Encounter (Signed)
Message copied by Sheral Flow on Wed Sep 30, 2011  8:23 AM ------      Message from: Bernita Buffy D      Created: Mon Sep 14, 2011  4:04 PM       Check on coag letter to Dr Delrae Alfred little

## 2011-10-07 ENCOUNTER — Encounter: Payer: Self-pay | Admitting: Gastroenterology

## 2011-10-07 ENCOUNTER — Ambulatory Visit (AMBULATORY_SURGERY_CENTER): Payer: Medicare Other | Admitting: Gastroenterology

## 2011-10-07 VITALS — BP 136/66 | HR 87 | Temp 97.7°F | Resp 17 | Ht 72.0 in | Wt 212.0 lb

## 2011-10-07 DIAGNOSIS — K2270 Barrett's esophagus without dysplasia: Secondary | ICD-10-CM

## 2011-10-07 DIAGNOSIS — Z86010 Personal history of colonic polyps: Secondary | ICD-10-CM

## 2011-10-07 DIAGNOSIS — D126 Benign neoplasm of colon, unspecified: Secondary | ICD-10-CM

## 2011-10-07 DIAGNOSIS — K219 Gastro-esophageal reflux disease without esophagitis: Secondary | ICD-10-CM

## 2011-10-07 DIAGNOSIS — D509 Iron deficiency anemia, unspecified: Secondary | ICD-10-CM

## 2011-10-07 DIAGNOSIS — K5190 Ulcerative colitis, unspecified, without complications: Secondary | ICD-10-CM

## 2011-10-07 LAB — GLUCOSE, CAPILLARY: Glucose-Capillary: 139 mg/dL — ABNORMAL HIGH (ref 70–99)

## 2011-10-07 MED ORDER — SODIUM CHLORIDE 0.9 % IV SOLN: 500.0000 mL | INTRAVENOUS | Status: DC

## 2011-10-07 NOTE — Op Note (Signed)
[facility] [address]. [place], [place]  [address]  COLONOSCOPY PROCEDURE REPORT  PATIENT:  Amine, Adelson  MR#:  [id number] BIRTHDATE:  1941/03/25, 70 yrs. old  GENDER:  male ENDOSCOPIST:  Loralee Pacas. Sharlett Iles, MD, [facility] REF. BY: PROCEDURE DATE:  10/07/2011 PROCEDURE:  Colonoscopy with biopsy ASA CLASS:  Class II INDICATIONS:  DYSPLASIA SCREENING FOR CHRONIC IBD. MEDICATIONS:   propofol (Diprivan) 200 mg IV  DESCRIPTION OF PROCEDURE:   After the risks and benefits and of the procedure were explained, informed consent was obtained. Digital rectal exam was performed and revealed no abnormalities. The LB PCF-H180AL [id number] endoscope was introduced through the anus and advanced to the cecum, which was identified by both the appendix and ileocecal valve.  The quality of the prep was good, using MoviPrep.  The instrument was then slowly withdrawn as the colon was fully examined. <<PROCEDUREIMAGES>>  FINDINGS:  A sessile polyp was found at the hepatic flexure. FLAT 5 MM POLYP COLD SNARE REMOVED  This was otherwise a normal examination of the colon. SCARRED SIGMOID COLON MUCOSA BIOPSIED.RIGHT AND LEFT COLON RANDOM BIOPSIES DONE.   Retroflexed views in the rectum revealed no abnormalities.    The scope was then withdrawn from the patient and the procedure completed.  COMPLICATIONS:  None ENDOSCOPIC IMPRESSION: 1) Sessile polyp at the hepatic flexure 2) Otherwise normal examination 1.R/O DYSPLASIA 2.R/O ADENOMA 3.IBD IN REMISSION RECOMMENDATIONS: 1) Await pathology results 2) Repeat Colonoscopy in 3 years. CONTINUE CURRENT MEDS.  REPEAT EXAM:  No  ______________________________ Loralee Pacas. Sharlett Iles, MD, [facility]  CC:  Derinda Late, MD  n. Lorrin MaisMarland Kitchen   Loralee Pacas. Ryon Layton at 10/07/2011 11:35 AM  Mordecai Rasmussen, [phone]

## 2011-10-07 NOTE — Progress Notes (Signed)
Patient did not experience any of the following events: a burn prior to discharge; a fall within the facility; wrong site/side/patient/procedure/implant event; or a hospital transfer or hospital admission upon discharge from the facility. (G8907) Patient did not have preoperative order for IV antibiotic SSI prophylaxis. (G8918)  

## 2011-10-07 NOTE — Patient Instructions (Signed)
YOU HAD AN ENDOSCOPIC PROCEDURE TODAY AT THE [facility]: Refer to the procedure report that was given to you for any specific questions about what was found during the examination.  If the procedure report does not answer your questions, please call your gastroenterologist to clarify.  If you requested that your care partner not be given the details of your procedure findings, then the procedure report has been included in a sealed envelope for you to review at your convenience later.  YOU SHOULD EXPECT: Some feelings of bloating in the abdomen. Passage of more gas than usual.  Walking can help get rid of the air that was put into your GI tract during the procedure and reduce the bloating. If you had a lower endoscopy (such as a colonoscopy or flexible sigmoidoscopy) you may notice spotting of blood in your stool or on the toilet paper. If you underwent a bowel prep for your procedure, then you may not have a normal bowel movement for a few days.  DIET: Your first meal following the procedure should be a light meal and then it is ok to progress to your normal diet.  A half-sandwich or bowl of soup is an example of a good first meal.  Heavy or fried foods are harder to digest and may make you feel nauseous or bloated.  Likewise meals heavy in dairy and vegetables can cause extra gas to form and this can also increase the bloating.  Drink plenty of fluids but you should avoid alcoholic beverages for 24 hours.  ACTIVITY: Your care partner should take you home directly after the procedure.  You should plan to take it easy, moving slowly for the rest of the day.  You can resume normal activity the day after the procedure however you should NOT DRIVE or use heavy machinery for 24 hours (because of the sedation medicines used during the test).    SYMPTOMS TO REPORT IMMEDIATELY: A gastroenterologist can be reached at any hour.  During normal business hours, 8:30 AM to 5:00 PM Monday through Friday,  call [phone].  After hours and on weekends, please call the GI answering service at [phone] who will take a message and have the physician on call contact you.   Following lower endoscopy (colonoscopy or flexible sigmoidoscopy):  Excessive amounts of blood in the stool  Significant tenderness or worsening of abdominal pains  Swelling of the abdomen that is new, acute  Fever of 100F or higher  Following upper endoscopy (EGD)  Vomiting of blood or coffee ground material  New chest pain or pain under the shoulder blades  Painful or persistently difficult swallowing  New shortness of breath  Fever of 100F or higher  Black, tarry-looking stools  FOLLOW UP: If any biopsies were taken you will be contacted by phone or by letter within the next 1-3 weeks.  Call your gastroenterologist if you have not heard about the biopsies in 3 weeks.  Our staff will call the home number listed on your records the next business day following your procedure to check on you and address any questions or concerns that you may have at that time regarding the information given to you following your procedure. This is a courtesy call and so if there is no answer at the home number and we have not heard from you through the emergency physician on call, we will assume that you have returned to your regular daily activities without incident.  SIGNATURES/CONFIDENTIALITY: You and/or your care   partner have signed paperwork which will be entered into your electronic medical record.  These signatures attest to the fact that that the information above on your After Visit Summary has been reviewed and is understood.  Full responsibility of the confidentiality of this discharge information lies with you and/or your care-partner.  

## 2011-10-07 NOTE — Op Note (Signed)
[facility] [address]. [place], [place]  [address]  ENDOSCOPY PROCEDURE REPORT  PATIENT:  Timothy Mclean, Timothy Mclean  MR#:  [id number] BIRTHDATE:  [birth year], 14 yrs. old  GENDER:  male  ENDOSCOPIST:  Loralee Pacas. Sharlett Iles, MD, [facility] Referred by:  PROCEDURE DATE:  10/07/2011 PROCEDURE:  EGD with biopsy, [address] ASA CLASS:  Class II INDICATIONS:  GERD, Screening for Barrett's esopahgus in a GERD patient.  MEDICATIONS:   There was residual sedation effect present from prior procedure., propofol (Diprivan) 100 mg IV TOPICAL ANESTHETIC:  DESCRIPTION OF PROCEDURE:   After the risks and benefits of the procedure were explained, informed consent was obtained.  The LB GIF-H180 [id number] endoscope was introduced through the mouth and advanced to the second portion of the duodenum.  The instrument was slowly withdrawn as the mucosa was fully examined. <<PROCEDUREIMAGES>>  A hiatal hernia was found. 3 CM HH NOTED.  irregular Z-line. DISTAL ESOPHAGUS.?? BARRETT'S MUCOSA,NO EROSIONS,STRICTURE,ETC. NOTED.SEE PICTURES.  Otherwise the examination was normal. Retroflexed views revealed a hiatal hernia.    The scope was then withdrawn from the patient and the procedure completed.  COMPLICATIONS:  None  ENDOSCOPIC IMPRESSION: 1) Hiatal hernia 2) Irregular Z-line 3) Otherwise normal examination CHRONIC GERD ON PPI RX.,R/O BARRETT'S MUCOSA. RECOMMENDATIONS: 1) Await biopsy results 2) continue current medications  ______________________________ Loralee Pacas. Sharlett Iles, MD, [facility]  CC:  Derinda Late, MD  n. Lorrin MaisMarland Kitchen   Loralee Pacas. Patterson at 10/07/2011 11:40 AM  Mordecai Rasmussen, [phone]

## 2011-10-08 ENCOUNTER — Telehealth: Payer: Self-pay | Admitting: *Deleted

## 2011-10-08 NOTE — Telephone Encounter (Signed)
  Follow up Call-  Call back number 10/07/2011  Post procedure Call Back phone  # [phone]  Permission to leave phone message Yes     Patient questions:  Do you have a fever, pain , or abdominal swelling? no Pain Score  0 *  Have you tolerated food without any problems? yes  Have you been able to return to your normal activities? yes  Do you have any questions about your discharge instructions: Diet   yes Medications  no Follow up visit  no  Do you have questions or concerns about your Care? no  Actions: * If pain score is 4 or above: No action needed, pain <4.

## 2011-10-12 ENCOUNTER — Encounter: Payer: Self-pay | Admitting: Gastroenterology

## 2011-12-28 ENCOUNTER — Other Ambulatory Visit: Payer: Self-pay | Admitting: Family Medicine

## 2011-12-28 DIAGNOSIS — M549 Dorsalgia, unspecified: Secondary | ICD-10-CM

## 2012-01-12 ENCOUNTER — Ambulatory Visit
Admission: RE | Admit: 2012-01-12 | Discharge: 2012-01-12 | Disposition: A | Discharge status: Home / Self Care | Payer: Medicare Other | Source: Ambulatory Visit | Attending: Family Medicine | Admitting: Family Medicine

## 2012-01-12 DIAGNOSIS — M549 Dorsalgia, unspecified: Secondary | ICD-10-CM

## 2012-01-12 MED ORDER — METHYLPREDNISOLONE ACETATE 40 MG/ML INJ SUSP (RADIOLOG
120.0000 mg | Freq: Once | INTRAMUSCULAR | Status: AC
  Administered 2012-01-12: 120 mg via EPIDURAL

## 2012-01-12 MED ORDER — IOHEXOL 180 MG/ML  SOLN
1.0000 mL | Freq: Once | INTRAMUSCULAR | Status: AC | PRN
  Administered 2012-01-12: 1 mL via EPIDURAL

## 2012-01-12 NOTE — Discharge Instructions (Signed)
Post Procedure Spinal Discharge Instruction Sheet  1. You may resume a regular diet and any medications that you routinely take (including pain medications).  2. No driving day of procedure.  3. Light activity throughout the rest of the day.  Do not do any strenuous work, exercise, bending or lifting.  The day following the procedure, you can resume normal physical activity but you should refrain from exercising or physical therapy for at least three days thereafter.   Common Side Effects:   Headaches- take your usual medications as directed by your physician.  Increase your fluid intake.  Caffeinated beverages may be helpful.  Lie flat in bed until your headache resolves.   Restlessness or inability to sleep- you may have trouble sleeping for the next few days.  Ask your referring physician if you need any medication for sleep.   Facial flushing or redness- should subside within a few days.   Increased pain- a temporary increase in pain a day or two following your procedure is not unusual.  Take your pain medication as prescribed by your referring physician.   Leg cramps  Please contact our office at [phone] for the following symptoms:  Fever greater than 100 degrees.  Headaches unresolved with medication after 2-3 days.  Increased swelling, pain, or redness at injection site.  Thank you for visiting our office.   May resume coumadin today.

## 2012-02-01 ENCOUNTER — Other Ambulatory Visit: Payer: Self-pay | Admitting: Gastroenterology

## 2012-02-01 NOTE — Telephone Encounter (Signed)
lmom for pt to call back; I need to know what the letter said.

## 2012-02-02 NOTE — Telephone Encounter (Signed)
Pt reports he got a letter from [facility] stating they didn't have any Asacol, but his wife was able to order it today. Pt will call for further questions or problems.

## 2012-02-03 ENCOUNTER — Telehealth: Payer: Self-pay | Admitting: Gastroenterology

## 2012-02-03 NOTE — Telephone Encounter (Signed)
WHAT ??

## 2012-02-03 NOTE — Telephone Encounter (Signed)
Dr Jarold Motto,  What would you like patient to take in place of Asacol rx? He requests colozal.

## 2012-02-04 ENCOUNTER — Other Ambulatory Visit: Payer: Self-pay | Admitting: Gastroenterology

## 2012-02-04 MED ORDER — BALSALAZIDE DISODIUM 750 MG PO CAPS: ORAL_CAPSULE | ORAL | Status: DC

## 2012-02-04 NOTE — Telephone Encounter (Signed)
colazal 4 pills/day.one dose

## 2012-02-04 NOTE — Telephone Encounter (Signed)
Asacol is no longer available. Patient requests colozal. I am not sure that would be an appropriate change since that is a basalizide and Asacol is a mesalamine product. Therefore, I would like to know what medication you would like to switch him to in place of Asacol.

## 2012-02-18 ENCOUNTER — Encounter: Payer: Self-pay | Admitting: Cardiology

## 2012-02-29 ENCOUNTER — Ambulatory Visit
Admission: RE | Admit: 2012-02-29 | Discharge: 2012-02-29 | Disposition: A | Discharge status: Home / Self Care | Payer: Medicare Other | Source: Ambulatory Visit | Attending: Family Medicine | Admitting: Family Medicine

## 2012-02-29 ENCOUNTER — Other Ambulatory Visit: Payer: Self-pay | Admitting: Family Medicine

## 2012-02-29 DIAGNOSIS — M25519 Pain in unspecified shoulder: Secondary | ICD-10-CM

## 2012-07-26 ENCOUNTER — Other Ambulatory Visit: Payer: Self-pay | Admitting: *Deleted

## 2012-07-26 MED ORDER — BALSALAZIDE DISODIUM 750 MG PO CAPS: ORAL_CAPSULE | ORAL | Status: DC

## 2012-08-29 ENCOUNTER — Encounter: Payer: Self-pay | Admitting: Cardiology

## 2012-08-31 ENCOUNTER — Other Ambulatory Visit: Payer: Self-pay | Admitting: Family Medicine

## 2012-08-31 ENCOUNTER — Ambulatory Visit
Admission: RE | Admit: 2012-08-31 | Discharge: 2012-08-31 | Disposition: A | Discharge status: Home / Self Care | Payer: Medicare Other | Source: Ambulatory Visit | Attending: Family Medicine | Admitting: Family Medicine

## 2012-08-31 DIAGNOSIS — R05 Cough: Secondary | ICD-10-CM

## 2012-09-19 ENCOUNTER — Other Ambulatory Visit: Payer: Self-pay | Admitting: Family Medicine

## 2012-09-19 DIAGNOSIS — R05 Cough: Secondary | ICD-10-CM

## 2012-09-21 ENCOUNTER — Ambulatory Visit
Admission: RE | Admit: 2012-09-21 | Discharge: 2012-09-21 | Disposition: A | Discharge status: Home / Self Care | Payer: Medicare Other | Source: Ambulatory Visit | Attending: Family Medicine | Admitting: Family Medicine

## 2012-09-21 MED ORDER — IOHEXOL 300 MG/ML  SOLN
75.0000 mL | Freq: Once | INTRAMUSCULAR | Status: AC | PRN
  Administered 2012-09-21: 75 mL via INTRAVENOUS

## 2012-10-05 ENCOUNTER — Ambulatory Visit: Payer: Self-pay | Admitting: Cardiology

## 2012-10-05 DIAGNOSIS — I4891 Unspecified atrial fibrillation: Secondary | ICD-10-CM

## 2012-10-05 DIAGNOSIS — Z7901 Long term (current) use of anticoagulants: Secondary | ICD-10-CM

## 2012-10-11 ENCOUNTER — Other Ambulatory Visit: Payer: Self-pay | Admitting: Family Medicine

## 2012-10-11 DIAGNOSIS — M4800 Spinal stenosis, site unspecified: Secondary | ICD-10-CM

## 2012-10-11 DIAGNOSIS — M5030 Other cervical disc degeneration, unspecified cervical region: Secondary | ICD-10-CM

## 2012-10-11 DIAGNOSIS — M549 Dorsalgia, unspecified: Secondary | ICD-10-CM

## 2012-10-19 ENCOUNTER — Ambulatory Visit
Admission: RE | Admit: 2012-10-19 | Discharge: 2012-10-19 | Disposition: A | Discharge status: Home / Self Care | Payer: Medicare Other | Source: Ambulatory Visit | Attending: Family Medicine | Admitting: Family Medicine

## 2012-10-19 DIAGNOSIS — M4800 Spinal stenosis, site unspecified: Secondary | ICD-10-CM

## 2012-10-19 DIAGNOSIS — M5030 Other cervical disc degeneration, unspecified cervical region: Secondary | ICD-10-CM

## 2012-10-19 DIAGNOSIS — M549 Dorsalgia, unspecified: Secondary | ICD-10-CM

## 2012-10-19 MED ORDER — METHYLPREDNISOLONE ACETATE 40 MG/ML INJ SUSP (RADIOLOG
120.0000 mg | Freq: Once | INTRAMUSCULAR | Status: AC
  Administered 2012-10-19: 120 mg via EPIDURAL

## 2012-10-19 MED ORDER — IOHEXOL 180 MG/ML  SOLN
1.0000 mL | Freq: Once | INTRAMUSCULAR | Status: AC | PRN
  Administered 2012-10-19: 1 mL via EPIDURAL

## 2012-10-19 NOTE — Discharge Instructions (Signed)
Post Procedure Spinal Discharge Instruction Sheet  1. You may resume a regular diet and any medications that you routinely take (including pain medications).  2. No driving day of procedure.  3. Light activity throughout the rest of the day.  Do not do any strenuous work, exercise, bending or lifting.  The day following the procedure, you can resume normal physical activity but you should refrain from exercising or physical therapy for at least three days thereafter.   Common Side Effects:   Headaches- take your usual medications as directed by your physician.  Increase your fluid intake.  Caffeinated beverages may be helpful.  Lie flat in bed until your headache resolves.   Restlessness or inability to sleep- you may have trouble sleeping for the next few days.  Ask your referring physician if you need any medication for sleep.   Facial flushing or redness- should subside within a few days.   Increased pain- a temporary increase in pain a day or two following your procedure is not unusual.  Take your pain medication as prescribed by your referring physician.   Leg cramps  Please contact our office at [phone] for the following symptoms:  Fever greater than 100 degrees.  Headaches unresolved with medication after 2-3 days.  Increased swelling, pain, or redness at injection site.  Thank you for visiting our office.  MAY RESUME COUMADIN TODAY.

## 2012-11-29 ENCOUNTER — Encounter: Payer: Self-pay | Admitting: Gastroenterology

## 2012-11-29 ENCOUNTER — Ambulatory Visit (INDEPENDENT_AMBULATORY_CARE_PROVIDER_SITE_OTHER): Payer: Medicare Other | Admitting: Gastroenterology

## 2012-11-29 VITALS — BP 124/78 | HR 78 | Ht 72.0 in | Wt 207.6 lb

## 2012-11-29 DIAGNOSIS — K219 Gastro-esophageal reflux disease without esophagitis: Secondary | ICD-10-CM

## 2012-11-29 DIAGNOSIS — K5190 Ulcerative colitis, unspecified, without complications: Secondary | ICD-10-CM

## 2012-11-29 NOTE — Progress Notes (Signed)
This is a 72 year old Caucasian male Coumadin for atrial fibrillation.  He has chronic ulcerative colitis in remission on by mouth amino salicylates.  He apparently had an episode of possible C. difficile diarrhea, and continues with some gas and bloating and mild constipation but no melena or hematochezia.  He denies upper GI or hepatobiliary complaints.  Endoscopy and colonoscopy have been done within the last year.   Current Medications, Allergies, Past Medical History, Past Surgical History, Family History and Social History were reviewed in [employer] record.  ROS: All systems were reviewed and are negative unless otherwise stated in the HPI.          Physical Exam: Blood pressure 124/78, pulse 70 and regular, and weight 207.  He has multiple areas of ecchymosis on his extremities.  He is alert and awake in no distress.  Mental status clear.  Chest is clear he appears to be in a regular rhythm without murmurs gallops or rubs.  Abdominal exam shows no organomegaly, masses or tenderness.  Bowel sounds are normal.    Assessment and Plan: Laboratory bowel disease in remission on amino salicylates.  I've asked him to continue all his medications and have prescribed Florstar probiotic for 2 weeks.  She is on a when necessary basis as needed.  His lab work is all performed by his primary care physician Dr. Mosetta Putt.  He takes Prilosec 40 mg a day for chronic GERD.  This is last visit his statin medication and his metformin had been discontinued by primary care. No diagnosis found.

## 2012-11-29 NOTE — Patient Instructions (Addendum)
Please purchase Florastor over the counter and take one capsule by mouth once daily.  Please follow up in one year or call if not any better.

## 2012-12-16 ENCOUNTER — Other Ambulatory Visit: Payer: Self-pay

## 2012-12-16 MED ORDER — BALSALAZIDE DISODIUM 750 MG PO CAPS: ORAL_CAPSULE | ORAL | Status: DC

## 2012-12-26 ENCOUNTER — Ambulatory Visit (INDEPENDENT_AMBULATORY_CARE_PROVIDER_SITE_OTHER): Payer: Medicare Other | Admitting: Pharmacist Clinician (PhC)/ Clinical Pharmacy Specialist

## 2012-12-26 VITALS — BP 110/62 | HR 90

## 2012-12-26 DIAGNOSIS — Z7901 Long term (current) use of anticoagulants: Secondary | ICD-10-CM

## 2012-12-26 DIAGNOSIS — I4891 Unspecified atrial fibrillation: Secondary | ICD-10-CM

## 2012-12-26 LAB — POCT INR: INR: 2.4

## 2013-01-11 ENCOUNTER — Emergency Department (HOSPITAL_COMMUNITY)
Admission: EM | Admit: 2013-01-11 | Discharge: 2013-01-11 | Disposition: A | Discharge status: Home / Self Care | Payer: Worker's Compensation | Attending: Emergency Medicine | Admitting: Emergency Medicine

## 2013-01-11 ENCOUNTER — Encounter (HOSPITAL_COMMUNITY): Payer: Self-pay | Admitting: Emergency Medicine

## 2013-01-11 ENCOUNTER — Emergency Department (HOSPITAL_COMMUNITY): Payer: Worker's Compensation

## 2013-01-11 DIAGNOSIS — Z862 Personal history of diseases of the blood and blood-forming organs and certain disorders involving the immune mechanism: Secondary | ICD-10-CM | POA: Insufficient documentation

## 2013-01-11 DIAGNOSIS — Y9289 Other specified places as the place of occurrence of the external cause: Secondary | ICD-10-CM | POA: Insufficient documentation

## 2013-01-11 DIAGNOSIS — Z87448 Personal history of other diseases of urinary system: Secondary | ICD-10-CM | POA: Insufficient documentation

## 2013-01-11 DIAGNOSIS — Z7901 Long term (current) use of anticoagulants: Secondary | ICD-10-CM | POA: Insufficient documentation

## 2013-01-11 DIAGNOSIS — S1093XA Contusion of unspecified part of neck, initial encounter: Secondary | ICD-10-CM | POA: Insufficient documentation

## 2013-01-11 DIAGNOSIS — Z860100 Personal history of colon polyps, unspecified: Secondary | ICD-10-CM | POA: Insufficient documentation

## 2013-01-11 DIAGNOSIS — I252 Old myocardial infarction: Secondary | ICD-10-CM | POA: Insufficient documentation

## 2013-01-11 DIAGNOSIS — E119 Type 2 diabetes mellitus without complications: Secondary | ICD-10-CM | POA: Insufficient documentation

## 2013-01-11 DIAGNOSIS — Z951 Presence of aortocoronary bypass graft: Secondary | ICD-10-CM | POA: Insufficient documentation

## 2013-01-11 DIAGNOSIS — Z79899 Other long term (current) drug therapy: Secondary | ICD-10-CM | POA: Insufficient documentation

## 2013-01-11 DIAGNOSIS — E785 Hyperlipidemia, unspecified: Secondary | ICD-10-CM | POA: Insufficient documentation

## 2013-01-11 DIAGNOSIS — IMO0002 Reserved for concepts with insufficient information to code with codable children: Secondary | ICD-10-CM | POA: Insufficient documentation

## 2013-01-11 DIAGNOSIS — Y939 Activity, unspecified: Secondary | ICD-10-CM | POA: Insufficient documentation

## 2013-01-11 DIAGNOSIS — Z8719 Personal history of other diseases of the digestive system: Secondary | ICD-10-CM | POA: Insufficient documentation

## 2013-01-11 DIAGNOSIS — Z87891 Personal history of nicotine dependence: Secondary | ICD-10-CM | POA: Insufficient documentation

## 2013-01-11 DIAGNOSIS — Z7982 Long term (current) use of aspirin: Secondary | ICD-10-CM | POA: Insufficient documentation

## 2013-01-11 DIAGNOSIS — Z8639 Personal history of other endocrine, nutritional and metabolic disease: Secondary | ICD-10-CM | POA: Insufficient documentation

## 2013-01-11 DIAGNOSIS — S0003XA Contusion of scalp, initial encounter: Secondary | ICD-10-CM | POA: Insufficient documentation

## 2013-01-11 DIAGNOSIS — Y990 Civilian activity done for income or pay: Secondary | ICD-10-CM | POA: Insufficient documentation

## 2013-01-11 DIAGNOSIS — I10 Essential (primary) hypertension: Secondary | ICD-10-CM | POA: Insufficient documentation

## 2013-01-11 DIAGNOSIS — S50812A Abrasion of left forearm, initial encounter: Secondary | ICD-10-CM

## 2013-01-11 LAB — GLUCOSE, CAPILLARY: Glucose-Capillary: 134 mg/dL — ABNORMAL HIGH (ref 70–99)

## 2013-01-11 LAB — BASIC METABOLIC PANEL
CO2: 22 mEq/L (ref 19–32)
Calcium: 9 mg/dL (ref 8.4–10.5)
Creatinine, Ser: 1.34 mg/dL (ref 0.50–1.35)
Glucose, Bld: 115 mg/dL — ABNORMAL HIGH (ref 70–99)

## 2013-01-11 LAB — PROTIME-INR: INR: 2.03 — ABNORMAL HIGH (ref 0.00–1.49)

## 2013-01-11 LAB — CBC
HCT: 39.2 % (ref 39.0–52.0)
MCHC: 34.2 g/dL (ref 30.0–36.0)
RDW: 13.5 % (ref 11.5–15.5)

## 2013-01-11 MED ORDER — CEPHALEXIN 500 MG PO CAPS: 500.0000 mg | ORAL_CAPSULE | Freq: Three times a day (TID) | ORAL | Status: DC

## 2013-01-11 MED ORDER — SODIUM CHLORIDE 0.9 % IV BOLUS (SEPSIS)
1000.0000 mL | Freq: Once | INTRAVENOUS | Status: AC
  Administered 2013-01-11: 1000 mL via INTRAVENOUS

## 2013-01-11 NOTE — ED Notes (Signed)
Pt remains alert and oriented x's 3.  C-collar remains in place.

## 2013-01-11 NOTE — ED Notes (Signed)
EMS reports pt had refused long spine board

## 2013-01-11 NOTE — ED Notes (Signed)
Pt to x-ray and CT at this time.

## 2013-01-11 NOTE — ED Notes (Signed)
Pt requesting to remove c-collar, explained importance of c-collar until x-rayed.  Pt voices understanding.  Family remains at bedside.  Warm blanket given

## 2013-01-11 NOTE — Progress Notes (Signed)
Chaplain Note :  Chaplain responded immediately to LV2 trauma page received at 19:02.  Pt arrived via EMS and was taken to trauma bay B. Pt was awake, alert, and in good spirits. EMS reported that family had been contacted and was en route.  When family arrived, chaplain escorted them to conference room A and, when it was appropriate to do so, escorted them to pt's bedside. Chaplain provided spiritual comfort and support for pt and pt's family.  Pt and family expressed appreciation for chaplain support. Chaplain will follow up as needed.  01/11/13 1900  Clinical Encounter Type  Visited With Patient and family together  Visit Type Spiritual support;ED;Trauma  Referral From Other (Comment) (Trauma Page)  Spiritual Encounters  Spiritual Needs Emotional  Stress Factors  Patient Stress Factors Health changes  Family Stress Factors Major life changes  Verdie Shire, Chaplain [phone]

## 2013-01-11 NOTE — ED Notes (Signed)
Family at beside. Family talking to pt.

## 2013-01-11 NOTE — ED Notes (Signed)
Pt returned from CT and x-ray.  Pt remains alert and oriented x's 3.  Family at bedside.

## 2013-01-11 NOTE — ED Provider Notes (Signed)
I saw and evaluated the patient, reviewed the resident's note and I agree with the findings and plan.  Patient with large abrasion on left arm, xray negative. On coumadin, head CT negative.  Orpah Greek, MD 01/11/13 2239
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
## 2013-01-11 NOTE — ED Notes (Signed)
C-Collar removed per Dr. Bing Plume approval. Patient denies any neck pain.

## 2013-01-11 NOTE — ED Notes (Signed)
Wounds cleaned and dressing applied.

## 2013-01-13 ENCOUNTER — Emergency Department (HOSPITAL_COMMUNITY)
Admission: EM | Admit: 2013-01-13 | Discharge: 2013-01-13 | Disposition: A | Discharge status: Home / Self Care | Payer: Worker's Compensation | Attending: Emergency Medicine | Admitting: Emergency Medicine

## 2013-01-13 ENCOUNTER — Encounter (HOSPITAL_COMMUNITY): Payer: Self-pay | Admitting: Emergency Medicine

## 2013-01-13 DIAGNOSIS — Z79899 Other long term (current) drug therapy: Secondary | ICD-10-CM | POA: Insufficient documentation

## 2013-01-13 DIAGNOSIS — Y9389 Activity, other specified: Secondary | ICD-10-CM | POA: Insufficient documentation

## 2013-01-13 DIAGNOSIS — I10 Essential (primary) hypertension: Secondary | ICD-10-CM | POA: Insufficient documentation

## 2013-01-13 DIAGNOSIS — I252 Old myocardial infarction: Secondary | ICD-10-CM | POA: Insufficient documentation

## 2013-01-13 DIAGNOSIS — K219 Gastro-esophageal reflux disease without esophagitis: Secondary | ICD-10-CM | POA: Insufficient documentation

## 2013-01-13 DIAGNOSIS — Z7982 Long term (current) use of aspirin: Secondary | ICD-10-CM | POA: Insufficient documentation

## 2013-01-13 DIAGNOSIS — Z860100 Personal history of colon polyps, unspecified: Secondary | ICD-10-CM | POA: Insufficient documentation

## 2013-01-13 DIAGNOSIS — E119 Type 2 diabetes mellitus without complications: Secondary | ICD-10-CM | POA: Insufficient documentation

## 2013-01-13 DIAGNOSIS — Y9229 Other specified public building as the place of occurrence of the external cause: Secondary | ICD-10-CM | POA: Insufficient documentation

## 2013-01-13 DIAGNOSIS — IMO0002 Reserved for concepts with insufficient information to code with codable children: Secondary | ICD-10-CM | POA: Insufficient documentation

## 2013-01-13 DIAGNOSIS — Z8719 Personal history of other diseases of the digestive system: Secondary | ICD-10-CM | POA: Insufficient documentation

## 2013-01-13 DIAGNOSIS — E785 Hyperlipidemia, unspecified: Secondary | ICD-10-CM | POA: Insufficient documentation

## 2013-01-13 DIAGNOSIS — Z7901 Long term (current) use of anticoagulants: Secondary | ICD-10-CM | POA: Insufficient documentation

## 2013-01-13 DIAGNOSIS — S50812D Abrasion of left forearm, subsequent encounter: Secondary | ICD-10-CM

## 2013-01-13 DIAGNOSIS — Z8639 Personal history of other endocrine, nutritional and metabolic disease: Secondary | ICD-10-CM | POA: Insufficient documentation

## 2013-01-13 DIAGNOSIS — Z87891 Personal history of nicotine dependence: Secondary | ICD-10-CM | POA: Insufficient documentation

## 2013-01-13 DIAGNOSIS — Z951 Presence of aortocoronary bypass graft: Secondary | ICD-10-CM | POA: Insufficient documentation

## 2013-01-13 DIAGNOSIS — Z862 Personal history of diseases of the blood and blood-forming organs and certain disorders involving the immune mechanism: Secondary | ICD-10-CM | POA: Insufficient documentation

## 2013-01-13 LAB — PROTIME-INR
INR: 1.34 (ref 0.00–1.49)
Prothrombin Time: 16.3 seconds — ABNORMAL HIGH (ref 11.6–15.2)
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
## 2013-01-18 NOTE — ED Provider Notes (Signed)
History     72yM presenting for wound check. Sustained multiple wounds after fall on 6/25. Continued bleeding from L forearm abrasions. On coumadin. No numbness, tingling or loss of strength. Imaging from prior ED visit reviewed. Reassuring. Pt is on 59m ASA and also coumadin.   CSN: [id number] date & time 01/13/13  1118  First MD Initiated Contact with Patient 01/13/13 1153     Chief Complaint  Patient presents with  . Laceration   (Consider location/radiation/quality/duration/timing/severity/associated sxs/prior Treatment) HPI Past Medical History  Diagnosis Date  . Gout, unspecified   . Acute myocardial infarction, unspecified site, episode of care unspecified   . Iron deficiency anemia, unspecified   . Other and unspecified hyperlipidemia   . Esophageal reflux   . Unspecified essential hypertension   . Type II or unspecified type diabetes mellitus without mention of complication, not stated as uncontrolled   . Ulcerative colitis, unspecified   . Other B-complex deficiencies   . Personal history of colonic polyps    Past Surgical History  Procedure Laterality Date  . Coronary artery bypass graft  11/1995  . Cholecystectomy     Family History  Problem Relation Age of Onset  . Diabetes Mother    History  Substance Use Topics  . Smoking status: Former Smoker -- 1.00 packs/day for 32 years    Types: Cigarettes    Quit date: 08/27/1984  . Smokeless tobacco: Never Used  . Alcohol Use: No    Review of Systems  All systems reviewed and negative, other than as noted in HPI.   Allergies  Review of patient's allergies indicates no known allergies.  Home Medications   Current Outpatient Rx  Name  Route  Sig  Dispense  Refill  . acetaminophen (TYLENOL) 500 MG tablet   Oral   Take 1,000 mg by mouth every 6 (six) hours as needed for pain.         .Marland Kitchenamiodarone (PACERONE) 200 MG tablet   Oral   Take 200 mg by mouth daily.          . balsalazide  (COLAZAL) 750 MG capsule   Oral   Take 1,500 mg by mouth 2 (two) times daily.         . cephALEXin (KEFLEX) 500 MG capsule   Oral   Take 500 mg by mouth 3 (three) times daily. 7 day therapy course began on 01/11/2013         . finasteride (PROSCAR) 5 MG tablet   Oral   Take 5 mg by mouth daily.         . fluvastatin (LESCOL) 20 MG capsule   Oral   Take 20 mg by mouth at bedtime.         . furosemide (LASIX) 20 MG tablet   Oral   Take 20 mg by mouth daily as needed for fluid.         .Marland Kitchenlevothyroxine (SYNTHROID, LEVOTHROID) 100 MCG tablet   Oral   Take 100 mcg by mouth daily before breakfast.         . losartan (COZAAR) 25 MG tablet   Oral   Take 25 mg by mouth daily.         .Marland Kitchenoxybutynin (DITROPAN) 5 MG tablet   Oral   Take 5 mg by mouth at bedtime.          . sitaGLIPtin (JANUVIA) 50 MG tablet   Oral   Take 50 mg by mouth daily.         .[name]  Tamsulosin HCl (FLOMAX) 0.4 MG CAPS   Oral   Take 0.4 mg by mouth daily.         . verapamil (CALAN-SR) 120 MG CR tablet   Oral   Take 120 mg by mouth daily.          . vitamin D, CHOLECALCIFEROL, 400 UNITS tablet   Oral   Take 400 Units by mouth daily.         [name] aspirin 81 MG tablet   Oral   Take 81 mg by mouth daily.         . pantoprazole (PROTONIX) 40 MG tablet   Oral   Take 40 mg by mouth daily.         [name] warfarin (COUMADIN) 5 MG tablet   Oral   Take 5 mg by mouth at bedtime. Takes 43m on Mon, Wed and Fri's each week All other days takes 2.55m         BP 140/63  Pulse 78  Temp(Src) 97.7 F (36.5 C)  Resp 16  SpO2 98% Physical Exam  Nursing note and vitals reviewed. Constitutional: He appears well-developed and well-nourished. No distress.  HENT:  Head: Normocephalic and atraumatic.  Eyes: Conjunctivae are normal. Right eye exhibits no discharge. Left eye exhibits no discharge.  Neck: Neck supple.  Cardiovascular: Normal rate, regular rhythm and normal heart sounds.  Exam  reveals no gallop and no friction rub.   No murmur heard. Pulmonary/Chest: Effort normal and breath sounds normal. No respiratory distress.  Abdominal: Soft. He exhibits no distension. There is no tenderness.  Musculoskeletal: He exhibits no edema and no tenderness.  Neurological: He is alert.  Skin: Skin is warm and dry.  Extensive abrasion of varying depth to dorsal aspect of L hand extending to proximal forearm. Several areas of venous oozing. NVI. No signs of infection. Large areas of ecchymosis RUE and also face. Scattered scabbed abrasions in multiple areas. No active bleeding aside from l forearm.   Psychiatric: He has a normal mood and affect. His behavior is normal. Thought content normal.    ED Course  Procedures (including critical care time) Labs Reviewed  PROTIME-INR - Abnormal; Notable for the following:    Prothrombin Time 16.3 (*)    All other components within normal limits   No results found. 1. Forearm abrasion, left, subsequent encounter     MDM  72yM with extensive abrasion to L forearm on coumadin. INR actually normal. Pt instructed to continue coumadin as prescribed and need for repeat INR in 3-4 days. Some areas of continued oozing, particularly . Local injection with small amount of lido w/epinephrine to these areas with good hemostasis. Small laceration to proximal aspect of forearm. Closure deferred given age of wound. Should heal fine by secondary intention. Wound copiously irrigated and re-wrapped. No clinical signs of infection at this time. Spent significant time discussing continues wound care. Bandage supplies provided on DC. Pt/wife also stating that can get assistance if needs with bandage changes at fires tation. outpt FU.   StVirgel ManifoldMD 01/18/13 1056

## 2013-02-06 ENCOUNTER — Ambulatory Visit: Payer: Medicare Other | Admitting: Pharmacist Clinician (PhC)/ Clinical Pharmacy Specialist

## 2013-02-06 ENCOUNTER — Ambulatory Visit (INDEPENDENT_AMBULATORY_CARE_PROVIDER_SITE_OTHER): Payer: Medicare Other | Admitting: Pharmacist Clinician (PhC)/ Clinical Pharmacy Specialist

## 2013-02-06 VITALS — BP 98/50 | HR 72

## 2013-02-06 DIAGNOSIS — Z7901 Long term (current) use of anticoagulants: Secondary | ICD-10-CM

## 2013-02-06 DIAGNOSIS — I4891 Unspecified atrial fibrillation: Secondary | ICD-10-CM

## 2013-02-15 ENCOUNTER — Encounter: Payer: Self-pay | Admitting: Cardiology

## 2013-02-15 ENCOUNTER — Ambulatory Visit: Payer: Self-pay | Admitting: Cardiology

## 2013-02-15 ENCOUNTER — Ambulatory Visit (INDEPENDENT_AMBULATORY_CARE_PROVIDER_SITE_OTHER): Payer: Medicare Other | Admitting: Cardiology

## 2013-02-15 VITALS — BP 118/62 | HR 75 | Ht 72.0 in | Wt 207.0 lb

## 2013-02-15 DIAGNOSIS — Z9861 Coronary angioplasty status: Secondary | ICD-10-CM

## 2013-02-15 DIAGNOSIS — I480 Paroxysmal atrial fibrillation: Secondary | ICD-10-CM

## 2013-02-15 DIAGNOSIS — I2510 Atherosclerotic heart disease of native coronary artery without angina pectoris: Secondary | ICD-10-CM

## 2013-02-15 DIAGNOSIS — Z9229 Personal history of other drug therapy: Secondary | ICD-10-CM

## 2013-02-15 DIAGNOSIS — E785 Hyperlipidemia, unspecified: Secondary | ICD-10-CM

## 2013-02-15 DIAGNOSIS — Z951 Presence of aortocoronary bypass graft: Secondary | ICD-10-CM

## 2013-02-15 DIAGNOSIS — I10 Essential (primary) hypertension: Secondary | ICD-10-CM

## 2013-02-15 DIAGNOSIS — I4891 Unspecified atrial fibrillation: Secondary | ICD-10-CM

## 2013-02-15 NOTE — Patient Instructions (Addendum)
Dr Herbie Baltimore would like you to get a Pulmonary Function Test. This will take place in our office, on a Monday Follow up in 6 months with Nada Boozer, NP. Follow up with Dr. Herbie Baltimore in 1 year.

## 2013-02-16 ENCOUNTER — Encounter: Payer: Self-pay | Admitting: Cardiology

## 2013-02-16 DIAGNOSIS — E785 Hyperlipidemia, unspecified: Secondary | ICD-10-CM | POA: Insufficient documentation

## 2013-02-16 DIAGNOSIS — Z951 Presence of aortocoronary bypass graft: Secondary | ICD-10-CM | POA: Insufficient documentation

## 2013-02-16 DIAGNOSIS — E1169 Type 2 diabetes mellitus with other specified complication: Secondary | ICD-10-CM | POA: Insufficient documentation

## 2013-02-16 DIAGNOSIS — I4821 Permanent atrial fibrillation: Secondary | ICD-10-CM | POA: Insufficient documentation

## 2013-02-16 DIAGNOSIS — Z9229 Personal history of other drug therapy: Secondary | ICD-10-CM | POA: Insufficient documentation

## 2013-02-16 DIAGNOSIS — I4811 Longstanding persistent atrial fibrillation: Secondary | ICD-10-CM | POA: Insufficient documentation

## 2013-02-16 NOTE — Assessment & Plan Note (Addendum)
No symptoms of any potential recurrence of atrial fibrillation. He doesn't really feel his PVCs, but he has reportedly in the past none when he is in A. Fib. The last time he was nature fibrillation he noted worsening dyspnea and edema.  Plan: This did not tolerate A. fib in the past, will continue amiodarone since his node adverse effects as yet. On warfarin, followed here by Tommy Medal, Pharm.D.

## 2013-02-16 NOTE — Assessment & Plan Note (Signed)
He gets routine lab work done by the primary physician checking TSH and liver function tests. He also gets routine eye exams and I recommended that he let his ophthalmologist/optometrist now that he is on amiodarone. We will the remaining evaluation would be PFTs to look for any changes in DLCO.  Plan: PFTs recheck here by the [facility] staff.
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
## 2013-02-16 NOTE — Assessment & Plan Note (Signed)
Very stable, no active anginal symptoms. He is very active. On aspirin, ARB, but no beta blocker as he is on amiodarone which provided some beta-blockade effect. He is on Verapamil for additional rate control of his atrial fibrillation, but wouldn't go to normal EF this also is acceptable his antianginal medication.  Plan: Continue current regimen.

## 2013-02-16 NOTE — Assessment & Plan Note (Addendum)
For some reason he was on Crestor for a while but now switched to Lescol. His labs are being followed by his primary physician.

## 2013-02-16 NOTE — Assessment & Plan Note (Signed)
Well-controlled on current medications. No new change.

## 2013-03-13 ENCOUNTER — Encounter (INDEPENDENT_AMBULATORY_CARE_PROVIDER_SITE_OTHER): Payer: Medicare Other

## 2013-03-13 DIAGNOSIS — R062 Wheezing: Secondary | ICD-10-CM

## 2013-03-13 DIAGNOSIS — Z9229 Personal history of other drug therapy: Secondary | ICD-10-CM

## 2013-03-21 ENCOUNTER — Ambulatory Visit (INDEPENDENT_AMBULATORY_CARE_PROVIDER_SITE_OTHER): Payer: Medicare Other | Admitting: Pharmacist Clinician (PhC)/ Clinical Pharmacy Specialist

## 2013-03-21 VITALS — BP 100/56 | HR 92

## 2013-03-21 DIAGNOSIS — Z7901 Long term (current) use of anticoagulants: Secondary | ICD-10-CM

## 2013-03-21 DIAGNOSIS — I4891 Unspecified atrial fibrillation: Secondary | ICD-10-CM

## 2013-03-29 ENCOUNTER — Other Ambulatory Visit: Payer: Self-pay | Admitting: *Deleted

## 2013-03-29 MED ORDER — AMIODARONE HCL 200 MG PO TABS: 200.0000 mg | ORAL_TABLET | Freq: Every day | ORAL | Status: DC

## 2013-03-29 NOTE — Telephone Encounter (Signed)
Rx was sent to pharmacy electronically. 

## 2013-05-01 ENCOUNTER — Other Ambulatory Visit: Payer: Self-pay | Admitting: Pharmacist Clinician (PhC)/ Clinical Pharmacy Specialist

## 2013-05-01 MED ORDER — WARFARIN SODIUM 5 MG PO TABS: ORAL_TABLET | ORAL | Status: DC

## 2013-05-02 ENCOUNTER — Ambulatory Visit: Payer: Self-pay | Admitting: Pharmacist Clinician (PhC)/ Clinical Pharmacy Specialist

## 2013-05-03 ENCOUNTER — Ambulatory Visit: Payer: Self-pay | Admitting: Pharmacist Clinician (PhC)/ Clinical Pharmacy Specialist

## 2013-05-04 ENCOUNTER — Ambulatory Visit (INDEPENDENT_AMBULATORY_CARE_PROVIDER_SITE_OTHER): Payer: Medicare Other | Admitting: Pharmacist Clinician (PhC)/ Clinical Pharmacy Specialist

## 2013-05-04 VITALS — BP 104/60 | HR 72

## 2013-05-04 DIAGNOSIS — Z7901 Long term (current) use of anticoagulants: Secondary | ICD-10-CM

## 2013-05-04 DIAGNOSIS — I4891 Unspecified atrial fibrillation: Secondary | ICD-10-CM

## 2013-06-19 ENCOUNTER — Ambulatory Visit (INDEPENDENT_AMBULATORY_CARE_PROVIDER_SITE_OTHER): Payer: Medicare Other | Admitting: Pharmacist Clinician (PhC)/ Clinical Pharmacy Specialist

## 2013-06-19 VITALS — BP 138/56 | HR 84

## 2013-06-19 DIAGNOSIS — I4891 Unspecified atrial fibrillation: Secondary | ICD-10-CM

## 2013-06-19 DIAGNOSIS — Z7901 Long term (current) use of anticoagulants: Secondary | ICD-10-CM

## 2013-06-19 LAB — POCT INR: INR: 1.9

## 2013-06-26 ENCOUNTER — Other Ambulatory Visit: Payer: Self-pay | Admitting: Family Medicine

## 2013-06-26 DIAGNOSIS — M549 Dorsalgia, unspecified: Secondary | ICD-10-CM

## 2013-06-27 ENCOUNTER — Ambulatory Visit: Payer: Self-pay | Admitting: Pharmacist Clinician (PhC)/ Clinical Pharmacy Specialist

## 2013-07-04 ENCOUNTER — Ambulatory Visit (INDEPENDENT_AMBULATORY_CARE_PROVIDER_SITE_OTHER): Payer: Medicare Other | Admitting: Pharmacist Clinician (PhC)/ Clinical Pharmacy Specialist

## 2013-07-04 VITALS — BP 130/58 | HR 84

## 2013-07-04 DIAGNOSIS — Z7901 Long term (current) use of anticoagulants: Secondary | ICD-10-CM

## 2013-07-04 DIAGNOSIS — I4891 Unspecified atrial fibrillation: Secondary | ICD-10-CM

## 2013-07-04 LAB — POCT INR: INR: 1

## 2013-07-05 ENCOUNTER — Ambulatory Visit
Admission: RE | Admit: 2013-07-05 | Discharge: 2013-07-05 | Disposition: A | Discharge status: Home / Self Care | Payer: Medicare Other | Source: Ambulatory Visit | Attending: Family Medicine | Admitting: Family Medicine

## 2013-07-05 DIAGNOSIS — M549 Dorsalgia, unspecified: Secondary | ICD-10-CM

## 2013-07-05 MED ORDER — METHYLPREDNISOLONE ACETATE 40 MG/ML INJ SUSP (RADIOLOG
120.0000 mg | Freq: Once | INTRAMUSCULAR | Status: AC
  Administered 2013-07-05: 120 mg via EPIDURAL

## 2013-07-05 MED ORDER — IOHEXOL 180 MG/ML  SOLN
1.0000 mL | Freq: Once | INTRAMUSCULAR | Status: AC | PRN
  Administered 2013-07-05: 1 mL via EPIDURAL

## 2013-07-11 ENCOUNTER — Encounter: Payer: Self-pay | Admitting: Gastroenterology

## 2013-07-25 ENCOUNTER — Ambulatory Visit (INDEPENDENT_AMBULATORY_CARE_PROVIDER_SITE_OTHER): Payer: Medicare Other | Admitting: Pharmacist Clinician (PhC)/ Clinical Pharmacy Specialist

## 2013-07-25 VITALS — BP 100/56 | HR 80

## 2013-07-25 DIAGNOSIS — Z7901 Long term (current) use of anticoagulants: Secondary | ICD-10-CM

## 2013-07-25 DIAGNOSIS — I4891 Unspecified atrial fibrillation: Secondary | ICD-10-CM

## 2013-07-25 DIAGNOSIS — Z5181 Encounter for therapeutic drug level monitoring: Secondary | ICD-10-CM

## 2013-07-25 LAB — POCT INR: INR: 1.7

## 2013-07-31 ENCOUNTER — Ambulatory Visit: Payer: Self-pay | Admitting: Pharmacist Clinician (PhC)/ Clinical Pharmacy Specialist

## 2013-08-02 ENCOUNTER — Other Ambulatory Visit: Payer: Self-pay | Admitting: *Deleted

## 2013-08-02 MED ORDER — BALSALAZIDE DISODIUM 750 MG PO CAPS: 1500.0000 mg | ORAL_CAPSULE | Freq: Two times a day (BID) | ORAL | Status: DC

## 2013-08-03 ENCOUNTER — Other Ambulatory Visit: Payer: Self-pay | Admitting: Family Medicine

## 2013-08-03 DIAGNOSIS — M549 Dorsalgia, unspecified: Secondary | ICD-10-CM

## 2013-08-11 ENCOUNTER — Ambulatory Visit (INDEPENDENT_AMBULATORY_CARE_PROVIDER_SITE_OTHER): Payer: Medicare Other | Admitting: Pharmacist Clinician (PhC)/ Clinical Pharmacy Specialist

## 2013-08-11 ENCOUNTER — Ambulatory Visit
Admission: RE | Admit: 2013-08-11 | Discharge: 2013-08-11 | Disposition: A | Discharge status: Home / Self Care | Payer: Medicare Other | Source: Ambulatory Visit | Attending: Family Medicine | Admitting: Family Medicine

## 2013-08-11 VITALS — BP 112/60 | HR 76

## 2013-08-11 DIAGNOSIS — M549 Dorsalgia, unspecified: Secondary | ICD-10-CM

## 2013-08-11 DIAGNOSIS — Z7901 Long term (current) use of anticoagulants: Secondary | ICD-10-CM

## 2013-08-11 DIAGNOSIS — I4891 Unspecified atrial fibrillation: Secondary | ICD-10-CM

## 2013-08-11 LAB — POCT INR: INR: 1

## 2013-08-11 MED ORDER — IOHEXOL 180 MG/ML  SOLN
1.0000 mL | Freq: Once | INTRAMUSCULAR | Status: AC | PRN
  Administered 2013-08-11: 1 mL via EPIDURAL

## 2013-08-11 MED ORDER — METHYLPREDNISOLONE ACETATE 40 MG/ML INJ SUSP (RADIOLOG
120.0000 mg | Freq: Once | INTRAMUSCULAR | Status: AC
  Administered 2013-08-11: 120 mg via EPIDURAL

## 2013-08-22 ENCOUNTER — Ambulatory Visit: Payer: Self-pay | Admitting: Pharmacist Clinician (PhC)/ Clinical Pharmacy Specialist

## 2013-08-25 ENCOUNTER — Ambulatory Visit (INDEPENDENT_AMBULATORY_CARE_PROVIDER_SITE_OTHER): Payer: Medicare Other | Admitting: Pharmacist Clinician (PhC)/ Clinical Pharmacy Specialist

## 2013-08-25 VITALS — BP 128/60 | HR 72

## 2013-08-25 DIAGNOSIS — I4891 Unspecified atrial fibrillation: Secondary | ICD-10-CM

## 2013-08-25 DIAGNOSIS — Z7901 Long term (current) use of anticoagulants: Secondary | ICD-10-CM

## 2013-08-25 LAB — POCT INR: INR: 1.9

## 2013-09-14 ENCOUNTER — Ambulatory Visit: Payer: Medicare Other | Admitting: Pharmacist Clinician (PhC)/ Clinical Pharmacy Specialist

## 2013-09-21 ENCOUNTER — Ambulatory Visit (INDEPENDENT_AMBULATORY_CARE_PROVIDER_SITE_OTHER): Payer: Medicare Other | Admitting: Pharmacist Clinician (PhC)/ Clinical Pharmacy Specialist

## 2013-09-21 VITALS — BP 120/56 | HR 80

## 2013-09-21 DIAGNOSIS — I4891 Unspecified atrial fibrillation: Secondary | ICD-10-CM

## 2013-09-21 DIAGNOSIS — Z7901 Long term (current) use of anticoagulants: Secondary | ICD-10-CM

## 2013-09-21 LAB — POCT INR: INR: 2.1

## 2013-10-20 ENCOUNTER — Ambulatory Visit: Payer: Medicare Other | Admitting: Pharmacist Clinician (PhC)/ Clinical Pharmacy Specialist

## 2013-10-23 ENCOUNTER — Ambulatory Visit (INDEPENDENT_AMBULATORY_CARE_PROVIDER_SITE_OTHER): Payer: Medicare Other | Admitting: Pharmacist Clinician (PhC)/ Clinical Pharmacy Specialist

## 2013-10-23 DIAGNOSIS — I4891 Unspecified atrial fibrillation: Secondary | ICD-10-CM

## 2013-10-23 DIAGNOSIS — Z7901 Long term (current) use of anticoagulants: Secondary | ICD-10-CM

## 2013-10-23 LAB — POCT INR: INR: 3.7

## 2013-10-30 ENCOUNTER — Other Ambulatory Visit: Payer: Self-pay | Admitting: Family Medicine

## 2013-10-30 DIAGNOSIS — M79606 Pain in leg, unspecified: Secondary | ICD-10-CM

## 2013-10-30 DIAGNOSIS — M549 Dorsalgia, unspecified: Secondary | ICD-10-CM

## 2013-11-13 ENCOUNTER — Ambulatory Visit
Admission: RE | Admit: 2013-11-13 | Discharge: 2013-11-13 | Disposition: A | Discharge status: Home / Self Care | Payer: Medicare Other | Source: Ambulatory Visit | Attending: Family Medicine | Admitting: Family Medicine

## 2013-11-13 ENCOUNTER — Other Ambulatory Visit: Payer: Medicare Other

## 2013-11-13 ENCOUNTER — Ambulatory Visit (INDEPENDENT_AMBULATORY_CARE_PROVIDER_SITE_OTHER): Payer: Medicare Other | Admitting: Pharmacist Clinician (PhC)/ Clinical Pharmacy Specialist

## 2013-11-13 DIAGNOSIS — M79606 Pain in leg, unspecified: Secondary | ICD-10-CM

## 2013-11-13 DIAGNOSIS — Z7901 Long term (current) use of anticoagulants: Secondary | ICD-10-CM

## 2013-11-13 DIAGNOSIS — I4891 Unspecified atrial fibrillation: Secondary | ICD-10-CM

## 2013-11-13 DIAGNOSIS — M549 Dorsalgia, unspecified: Secondary | ICD-10-CM

## 2013-11-13 LAB — POCT INR: INR: 1.1

## 2013-11-13 MED ORDER — METHYLPREDNISOLONE ACETATE 40 MG/ML INJ SUSP (RADIOLOG
120.0000 mg | Freq: Once | INTRAMUSCULAR | Status: AC
  Administered 2013-11-13: 120 mg via EPIDURAL

## 2013-11-13 MED ORDER — IOHEXOL 180 MG/ML  SOLN
1.0000 mL | Freq: Once | INTRAMUSCULAR | Status: AC | PRN
  Administered 2013-11-13: 1 mL via EPIDURAL

## 2013-11-27 ENCOUNTER — Ambulatory Visit (INDEPENDENT_AMBULATORY_CARE_PROVIDER_SITE_OTHER): Payer: Medicare Other | Admitting: Pharmacist Clinician (PhC)/ Clinical Pharmacy Specialist

## 2013-11-27 DIAGNOSIS — I4891 Unspecified atrial fibrillation: Secondary | ICD-10-CM

## 2013-11-27 DIAGNOSIS — Z7901 Long term (current) use of anticoagulants: Secondary | ICD-10-CM

## 2013-11-27 LAB — POCT INR: INR: 3.7

## 2013-11-28 ENCOUNTER — Telehealth: Payer: Self-pay | Admitting: Gastroenterology

## 2013-11-28 MED ORDER — BALSALAZIDE DISODIUM 750 MG PO CAPS: 1500.0000 mg | ORAL_CAPSULE | Freq: Two times a day (BID) | ORAL | Status: DC

## 2013-11-28 NOTE — Telephone Encounter (Signed)
Patient made a follow up visit. Patient is requesting refill on Colazal. Is it okay to refill until appointment?

## 2013-11-28 NOTE — Telephone Encounter (Signed)
ok 

## 2013-11-30 ENCOUNTER — Ambulatory Visit
Admission: RE | Admit: 2013-11-30 | Discharge: 2013-11-30 | Disposition: A | Discharge status: Home / Self Care | Payer: Medicare Other | Source: Ambulatory Visit | Attending: Family Medicine | Admitting: Family Medicine

## 2013-11-30 ENCOUNTER — Other Ambulatory Visit: Payer: Self-pay | Admitting: Family Medicine

## 2013-11-30 DIAGNOSIS — R0989 Other specified symptoms and signs involving the circulatory and respiratory systems: Secondary | ICD-10-CM

## 2013-11-30 DIAGNOSIS — R05 Cough: Secondary | ICD-10-CM

## 2013-11-30 DIAGNOSIS — R053 Chronic cough: Secondary | ICD-10-CM

## 2013-12-13 ENCOUNTER — Ambulatory Visit (INDEPENDENT_AMBULATORY_CARE_PROVIDER_SITE_OTHER): Payer: Medicare Other | Admitting: Pharmacist Clinician (PhC)/ Clinical Pharmacy Specialist

## 2013-12-13 DIAGNOSIS — I4891 Unspecified atrial fibrillation: Secondary | ICD-10-CM

## 2013-12-13 DIAGNOSIS — Z7901 Long term (current) use of anticoagulants: Secondary | ICD-10-CM

## 2013-12-13 LAB — POCT INR: INR: 3

## 2013-12-20 ENCOUNTER — Ambulatory Visit: Payer: Medicare Other | Admitting: Pharmacist Clinician (PhC)/ Clinical Pharmacy Specialist

## 2013-12-26 ENCOUNTER — Ambulatory Visit (INDEPENDENT_AMBULATORY_CARE_PROVIDER_SITE_OTHER): Payer: Medicare Other | Admitting: Neurology

## 2013-12-26 ENCOUNTER — Encounter (INDEPENDENT_AMBULATORY_CARE_PROVIDER_SITE_OTHER): Payer: Self-pay

## 2013-12-26 ENCOUNTER — Encounter: Payer: Self-pay | Admitting: Neurology

## 2013-12-26 VITALS — BP 112/58 | HR 92 | Temp 97.7°F | Ht 72.0 in | Wt 209.0 lb

## 2013-12-26 DIAGNOSIS — M5136 Other intervertebral disc degeneration, lumbar region: Secondary | ICD-10-CM

## 2013-12-26 DIAGNOSIS — E559 Vitamin D deficiency, unspecified: Secondary | ICD-10-CM

## 2013-12-26 DIAGNOSIS — R279 Unspecified lack of coordination: Secondary | ICD-10-CM

## 2013-12-26 DIAGNOSIS — M6281 Muscle weakness (generalized): Secondary | ICD-10-CM

## 2013-12-26 DIAGNOSIS — M5137 Other intervertebral disc degeneration, lumbosacral region: Secondary | ICD-10-CM

## 2013-12-26 DIAGNOSIS — R269 Unspecified abnormalities of gait and mobility: Secondary | ICD-10-CM

## 2013-12-26 DIAGNOSIS — R278 Other lack of coordination: Secondary | ICD-10-CM

## 2013-12-26 DIAGNOSIS — E538 Deficiency of other specified B group vitamins: Secondary | ICD-10-CM

## 2013-12-26 DIAGNOSIS — G609 Hereditary and idiopathic neuropathy, unspecified: Secondary | ICD-10-CM

## 2013-12-26 NOTE — Patient Instructions (Signed)
Follow up with Dr. Sandi Mariscal regarding your lovastatin and crestor, your b12 supplementation and perhaps need for b12 injections. We will do blood work today and an Geophysicist/field seismologist and nerve.  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   Remember to drink plenty of fluid, eat healthy meals and do not skip any meals. Try to eat protein with a every meal and eat a healthy snack such as fruit or nuts in between meals. Try to keep a regular sleep-wake schedule and try to exercise daily, particularly in the form of walking, 20-30 minutes a day, if you can. Change positions slowly and you should start using a cane.   As far as your medications are concerned, I would like to suggest no new medications. We will call you with the test results.

## 2013-12-26 NOTE — Progress Notes (Signed)
Subjective:    Patient ID: Timothy Mclean is a 73 y.o. male.  HPI    Star Age, MD, PhD [facility] [address] [place], [place] [address]  Dear Dr. Sandi Mariscal,   I saw your patient, Timothy Mclean, upon your kind request in my neurologic clinic today for initial consultation of his lower extremity pain and numbness. The patient is accompanied by his wife today. As you know, Timothy Mclean is a 73 year old right-handed gentleman with an underlying complex medical history of reflux disease, hypertension, chronic lung disease, vitamin D deficiency, vitamin B12 deficiency, thyroid disease, type 2 diabetes, heart disease, status post MI and PTCA in 1987, status post CABG in 1997, paroxysmal atrial fibrillation on Coumadin, gout, BPH, kidney stone, diverticulosis, shingles in 2009, who has had lower extremity pain (mild), weakness and numbness associated with low back pain since 2012. He was found to be intolerant to Lipitor generic and was taken off of it. He was switched to pravastatin but this was also subsequently discontinued with some subjective improvement of his symptoms. He had a mild increase in CK at 258 which subsequently returned to normal after discontinuation of his Zocor. He has known degenerative lower back disease with an MRI showing impingement at L4-5 and L5-S1 level, he has had multiple steroid epidural injections. Because of his vascular risk factors he was started on lovastatin. His last epidural injection was in January 2015 which was not helpful. Lovastatin was discontinued in February 2015. CK level was mildly elevated at 338. His B12 supplement dose was increased recently. He had a CT head without contrast on 01/11/2013 after a fall. This showed generalized atrophy, mild chronic microvascular ischemic changes in the white matter, no acute infarct. He also had a CT cervical spine without contrast at the time which showed moderate  degenerative changes, no fracture. MRI lumbar spine without contrast on 08/13/2011 showed lumbar spondylosis and degenerative disease, causing marked impingement at L4-5 and moderate impingement at L5-S1. He was started on Crestor last week, but is still taking the Lovastatin 40 mg daily as well. Your notes indicate in 5/15, that he is on B12 2000 micrograms daily, but the patient is not taking it and indicates, that after the B12 injections were stopped some 6-8 months ago, he has not been taking B12 by mouth.  He has some tingling in the feet. He has been diabetic for about 5 years. He has no significant foot pain. He denies fasciculations and atrophy, and myoclonus. His legs gives out some and he fatigues easily. He has not had recurrent falls, but did take a fall outside at his niece's ballgame, it was raining and the terrain was hilly and slick and rocky. 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 He volunteers at the firestation.  He quit smoking in '86 and does not drink alcohol. There is no FHx muscle or nerve disease.   His Past Medical History Is Significant For: Past Medical History  Diagnosis Date  . Gout, unspecified   . History of: ST elevation myocardial infarction (STEMI) of inferior wall 1987, and 97    PTCA of circumflex  . Iron deficiency anemia, unspecified   . Other and unspecified hyperlipidemia   . Esophageal reflux   . Unspecified essential hypertension   . Type II or unspecified type diabetes mellitus without mention of complication, not stated as uncontrolled   . Ulcerative colitis, unspecified   . Other  B-complex deficiencies   . Personal history of colonic polyps   . CAD S/P percutaneous coronary angioplasty 1987    Following angioplasty of circumflex and 87 and apparently stent to the circumflex  . S/P CABG x 4 1997    LIMA-LAD, SVG to OM, SVG-dRCA-RPL  . PAF (paroxysmal atrial fibrillation)     Last reported episode 2012; on  amiodarone and anticoagulated on warfarin  . Dyslipidemia, goal LDL below 70     Doing better off of Zocor, currently on Lescol     His Past Surgical History Is Significant For: Past Surgical History  Procedure Laterality Date  . Coronary artery bypass graft  11/1995    LIMA-LAD, SVG to OM, SVG-dRCA-RPL  . Cholecystectomy    . Cardiac catheterization  January 2005    "False positive stress test " grafts patent; RCA proximal 100% LAD 100% occlusion after normal D1 with 80% ostial as SP1; circumflex patent but OM1 on to all occluded; EF 50-55%  . Cardiac catheterization  11/1995    Preop CABG: LAD 90% at D1, circumflex 100 and OM a 90% proximal, 80% distal  . Nm myoview ltd  2005    Questionable ischemia that is likely either infarct versus artifact; normal  . Doppler echocardiography  2010    EF greater than 55%, mild aortic insufficiency, mild LA dilation    His Family History Is Significant For: Family History  Problem Relation Age of Onset  . Diabetes Mother     His Social History Is Significant For: History   Social History  . Marital Status: Married    Spouse Name: N/A    Number of Children: 1  . Years of Education: N/A   Occupational History  . retired    Social History Main Topics  . Smoking status: Former Smoker -- 1.00 packs/day for 32 years    Types: Cigarettes    Quit date: 08/27/1984  . Smokeless tobacco: Never Used  . Alcohol Use: No  . Drug Use: No  . Sexual Activity: None   Other Topics Concern  . None   Social History Narrative   He is a married father of one. Does not smoke and does not drink. He walks routinely and also does stationary bike. 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 Currently retired.    His Allergies Are:  No Known Allergies:   His Current Medications Are:  Outpatient Encounter Prescriptions as of 12/26/2013  Medication Sig  . amiodarone (PACERONE) 200 MG tablet Take 1  tablet (200 mg total) by mouth daily.  [name] aspirin 81 MG tablet Take 81 mg by mouth daily.  . balsalazide (COLAZAL) 750 MG capsule Take 2 capsules (1,500 mg total) by mouth 2 (two) times daily.  . CRESTOR 20 MG tablet Take 1 tablet by mouth daily.  . finasteride (PROSCAR) 5 MG tablet Take 5 mg by mouth daily.  . furosemide (LASIX) 20 MG tablet Take 20 mg by mouth daily as needed for fluid.  [name] glipiZIDE (GLUCOTROL XL) 5 MG 24 hr tablet Take 1 tablet by mouth daily.  [name] levothyroxine (SYNTHROID, LEVOTHROID) 100 MCG tablet Take 100 mcg by mouth daily before breakfast.  . losartan (COZAAR) 25 MG tablet Take 25 mg by mouth daily.  [name] lovastatin (MEVACOR) 40 MG tablet Take 40 mg by mouth daily.  . metFORMIN (GLUCOPHAGE) 500 MG tablet Take 1,000 mg by mouth 2 (two) times daily.  [name] oxybutynin (DITROPAN) 5 MG tablet  Take 5 mg by mouth at bedtime.   . pantoprazole (PROTONIX) 40 MG tablet Take 40 mg by mouth daily.  . sitaGLIPtin (JANUVIA) 50 MG tablet Take 50 mg by mouth daily.  . SYMBICORT 160-4.5 MCG/ACT inhaler Inhale 2 puffs into the lungs daily.  . Tamsulosin HCl (FLOMAX) 0.4 MG CAPS Take 0.4 mg by mouth daily.  . verapamil (CALAN-SR) 120 MG CR tablet Take 120 mg by mouth daily.   . vitamin D, CHOLECALCIFEROL, 400 UNITS tablet Take 400 Units by mouth daily.  [name] warfarin (COUMADIN) 5 MG tablet Take 1 tablet by mouth daily or as directed  . [DISCONTINUED] ciprofloxacin (CIPRO) 500 MG tablet Take 1 tablet by mouth 2 (two) times daily.  :   Review of Systems:  Out of a complete 14 point review of systems, all are reviewed and negative with the exception of these symptoms as listed below:   Review of Systems  Constitutional: Positive for activity change.  HENT: Negative.   Eyes: Negative.   Respiratory: Positive for cough and wheezing.   Cardiovascular: Negative.   Gastrointestinal: Negative.   Endocrine: Negative.   Genitourinary: Negative.   Musculoskeletal: Positive for back pain and gait  problem.  Skin: Negative.   Allergic/Immunologic: Negative.   Neurological: Positive for weakness.  Hematological: Bruises/bleeds easily.  Psychiatric/Behavioral: Negative.     Objective:  Neurologic Exam  Physical Exam Physical Examination:   Filed Vitals:   12/26/13 1403  BP: 112/58  Pulse: 92  Temp: 97.7 F (36.5 C)    General Examination: The patient is a very pleasant 73 y.o. male in no acute distress. He appears well-developed and well-nourished and well groomed. He is overweight.   HEENT: Normocephalic, atraumatic, pupils are equal, round and reactive to light and accommodation. Funduscopic exam is normal with sharp disc margins noted. Extraocular tracking is good without limitation to gaze excursion or nystagmus noted. Normal smooth pursuit is noted. Hearing is grossly intact. Tympanic membranes are clear bilaterally. Face is symmetric with normal facial animation and normal facial sensation. Speech is clear with no dysarthria noted. There is no hypophonia. There is no lip, neck/head, jaw or voice tremor. Neck is supple with full range of passive and active motion. There are no carotid bruits on auscultation. Oropharynx exam reveals: mild mouth dryness, adequate dental hygiene and moderate airway crowding, due to large tongue narrow appearing airway. Mallampati is class III. Tongue protrudes centrally and palate elevates symmetrically. .   Chest: Clear to auscultation without wheezing, rhonchi or crackles noted.  Heart: S1+S2+0, regular and normal without murmurs, rubs or gallops noted.   Abdomen: Soft, non-tender and non-distended with normal bowel sounds appreciated on auscultation.  Extremities: There is trace pitting edema in the distal lower extremities bilaterally. Pedal pulses are intact.  Skin: Warm and dry without trophic changes noted. There are no varicose veins.  Musculoskeletal: exam reveals no obvious joint deformities, tenderness or joint swelling or  erythema.   Neurologically:  Mental status: The patient is awake, alert and oriented in all 4 spheres. His immediate and remote memory, attention, language skills and fund of knowledge are appropriate. There is no evidence of aphasia, agnosia, apraxia or anomia. Speech is clear with normal prosody and enunciation. Thought process is linear. Mood is normal and affect is normal.  Cranial nerves II - XII are as described above under HEENT exam. In addition: shoulder shrug is normal with equal shoulder height noted. Motor exam: Normal bulk, strength and tone is noted in the upper  extremities. He has mildly decreased bulk in the lower extremities with no fasciculation or focal atrophy noted no trophic changes were noted. He has mild hip flexor weakness right more than left and mild knee flexor weakness on the left. There is no drift, tremor or rebound, with the exception of possible slight left upper extremity rebound. Romberg is notably positive. Reflexes are 2+ throughout with absent ankle jerks noted. Babinski: Toes are flexor bilaterally. Fine motor skills and coordination: intact with normal finger taps, normal hand movements, normal rapid alternating patting, normal foot taps and normal foot agility.  Cerebellar testing: No dysmetria or intention tremor on finger to nose testing. Heel to shin is unremarkable bilaterally. There is no truncal or gait ataxia.  Sensory exam: intact to light touch, pinprick, vibration, temperature sense proprioception in the upper extremities but decrease to pinprick and vibration in the lower extremities distally up to the ankles bilaterally and symmetrical. extremities.  Gait, station and balance: He stands with mild difficulty. He is not able to stand narrow based and stands wide-based and slightly bowlegged. No veering to one side is noted. No leaning to one side is noted. Posture is age-appropriate. He walks slightly wide-based. He is not able to perform tandem  walk.  Assessment and Plan:   In summary, Timothy Mclean is a very pleasant 73 y.o.-year old male with an underlying complex medical history of reflux disease, hypertension, chronic lung disease, vitamin D deficiency, vitamin B12 deficiency, thyroid disease, type 2 diabetes, heart disease, status post MI and PTCA in 1987, status post CABG in 1997, paroxysmal atrial fibrillation on Coumadin, gout, BPH, kidney stone, diverticulosis, shingles in 2009, who has a three-year history of progressive weakness and gait disorder. On examination, he has evidence of peripheral neuropathy, most likely diabetic in etiology given his history. He also has mild weakness in bilateral hip flexors. He has mild decrease in bulk generally speaking in the lower extremities as compared to the upper extremities with no focal atrophy or fasciculation noted. He has absence of ankle jerks which in and of itself at this age is not unusual. I think there is a combination of things that may be contributing to his symptomatology and clinical picture. He is diabetic which can cause a myopathy as well as a neuropathy which typically is painful and symmetrical and most typically axonal. He has had some intolerance to Lipitor and is currently on lovastatin as well as Crestor. He is advised to get clarification as to whether to stop his lovastatin. He is furthermore not on a B12 supplement and indicates that since his injections were stopped some 6-8 months ago he has not actually been taking an oral supplement for B12. He is advised to get clarification on this. He may need to go back to B12 injections. Contributing issues may be B12 deficiency, and the use of amiodarone which can cause some muscle weakness and patients. At this juncture I would like to proceed with blood work to include inflammatory markers, repeat CK and aldolase, and do EMG nerve conduction studies to the lower extremities. As a last resort we may entertain the possibility of a  muscle biopsy. I had a long chat with the patient and his wife regarding his symptomatology and his clinical presentation. We will do workup and call him with the test results. I answered all her questions today and the patient and his wife were in agreement. I will see him back routinely in a couple months, sooner if the need  arises. He will get in touch with your office regarding clarification regarding the lovastatin and the B12 supplementation. I've advised him to start using a cane as he was not able to walk narrow based and had trouble with his balance. Thank you very much for allowing me to participate in the care of this nice patient. If I can be of any further assistance to you please do not hesitate to call me at [phone].  Sincerely,   Star Age, MD, PhD

## 2013-12-28 NOTE — Progress Notes (Signed)
Quick Note:  Please advise patient or his wife that his diabetes marker was mildly elevated indicating suboptimal blood sugar control. This was not critically elevated but could be improved. His hemoglobin A1c was 7.5. His muscle enzymes were mildly elevated. This could be from his cholesterol medication. Please advise him again to discuss with his primary care physician his cholesterol medication and get clarification. He may have to discontinue his cholesterol medication altogether. Star Age, MD, PhD [facility]  ______

## 2013-12-29 LAB — TSH: TSH: 1.57 u[IU]/mL (ref 0.450–4.500)

## 2013-12-29 LAB — ALDOLASE: Aldolase: 10.4 U/L — ABNORMAL HIGH (ref 3.3–10.3)

## 2013-12-29 LAB — CK: CK TOTAL: 451 U/L — AB (ref 24–204)

## 2013-12-29 LAB — HGB A1C W/O EAG: Hgb A1c MFr Bld: 7.5 % — ABNORMAL HIGH (ref 4.8–5.6)

## 2013-12-29 LAB — METHYLMALONIC ACID, SERUM: Methylmalonic Acid: 469 nmol/L — ABNORMAL HIGH (ref 0–378)

## 2013-12-29 LAB — ANA W/REFLEX: ANA: NEGATIVE

## 2013-12-29 LAB — SEDIMENTATION RATE: Sed Rate: 11 mm/hr (ref 0–30)

## 2013-12-29 LAB — RPR: SYPHILIS RPR SCR: NONREACTIVE

## 2013-12-29 LAB — C-REACTIVE PROTEIN: CRP: 3 mg/L (ref 0.0–4.9)

## 2014-01-10 ENCOUNTER — Ambulatory Visit (INDEPENDENT_AMBULATORY_CARE_PROVIDER_SITE_OTHER): Payer: Medicare Other | Admitting: Pharmacist Clinician (PhC)/ Clinical Pharmacy Specialist

## 2014-01-10 DIAGNOSIS — I4891 Unspecified atrial fibrillation: Secondary | ICD-10-CM

## 2014-01-10 DIAGNOSIS — Z7901 Long term (current) use of anticoagulants: Secondary | ICD-10-CM

## 2014-01-10 LAB — POCT INR: INR: 3.7

## 2014-01-11 ENCOUNTER — Other Ambulatory Visit: Payer: Self-pay | Admitting: Family Medicine

## 2014-01-11 DIAGNOSIS — R053 Chronic cough: Secondary | ICD-10-CM

## 2014-01-11 DIAGNOSIS — R0600 Dyspnea, unspecified: Secondary | ICD-10-CM

## 2014-01-11 DIAGNOSIS — R05 Cough: Secondary | ICD-10-CM

## 2014-01-16 ENCOUNTER — Ambulatory Visit
Admission: RE | Admit: 2014-01-16 | Discharge: 2014-01-16 | Disposition: A | Discharge status: Home / Self Care | Payer: Medicare Other | Source: Ambulatory Visit | Attending: Family Medicine | Admitting: Family Medicine

## 2014-01-16 DIAGNOSIS — R053 Chronic cough: Secondary | ICD-10-CM

## 2014-01-16 DIAGNOSIS — R0600 Dyspnea, unspecified: Secondary | ICD-10-CM

## 2014-01-16 DIAGNOSIS — R05 Cough: Secondary | ICD-10-CM

## 2014-01-16 MED ORDER — IOHEXOL 300 MG/ML  SOLN
75.0000 mL | Freq: Once | INTRAMUSCULAR | Status: AC | PRN
  Administered 2014-01-16: 75 mL via INTRAVENOUS

## 2014-01-17 ENCOUNTER — Ambulatory Visit: Payer: Medicare Other | Admitting: Internal Medicine

## 2014-01-17 ENCOUNTER — Encounter (INDEPENDENT_AMBULATORY_CARE_PROVIDER_SITE_OTHER): Payer: Self-pay

## 2014-01-17 ENCOUNTER — Ambulatory Visit (INDEPENDENT_AMBULATORY_CARE_PROVIDER_SITE_OTHER): Payer: Medicare Other | Admitting: Neurology

## 2014-01-17 DIAGNOSIS — M5136 Other intervertebral disc degeneration, lumbar region: Secondary | ICD-10-CM

## 2014-01-17 DIAGNOSIS — IMO0002 Reserved for concepts with insufficient information to code with codable children: Secondary | ICD-10-CM

## 2014-01-17 DIAGNOSIS — M6281 Muscle weakness (generalized): Secondary | ICD-10-CM

## 2014-01-17 DIAGNOSIS — R278 Other lack of coordination: Secondary | ICD-10-CM

## 2014-01-17 DIAGNOSIS — G609 Hereditary and idiopathic neuropathy, unspecified: Secondary | ICD-10-CM

## 2014-01-17 DIAGNOSIS — M5416 Radiculopathy, lumbar region: Secondary | ICD-10-CM

## 2014-01-17 DIAGNOSIS — Z0289 Encounter for other administrative examinations: Secondary | ICD-10-CM

## 2014-01-17 DIAGNOSIS — E538 Deficiency of other specified B group vitamins: Secondary | ICD-10-CM

## 2014-01-17 DIAGNOSIS — E559 Vitamin D deficiency, unspecified: Secondary | ICD-10-CM

## 2014-01-17 DIAGNOSIS — R269 Unspecified abnormalities of gait and mobility: Secondary | ICD-10-CM

## 2014-01-18 NOTE — Procedures (Signed)
   NCS (NERVE CONDUCTION STUDY) WITH EMG (ELECTROMYOGRAPHY) REPORT   STUDY DATE: July 2nd 2015 PATIENT NAME: Timothy Mclean DOB: 16-Nov-1940 MRN: [id number]    TECHNOLOGIST: Laretta Alstrom ELECTROMYOGRAPHER: Marcial Pacas M.D.  CLINICAL INFORMATION:  73 years old male, with past medical history of obesity, hypertension, diabetes, coronary artery disease, atrial fibrillation, on chronic Coumadin treatment, presenting with low back pain since 2012, right onset gait difficulty  On examination: He has mild bilateral toe extension weakness, length dependent sensory changes, mild bilateral lower extremity spasticity, hyperreflexia of both upper and lower extremity.  Left Babinski sign    FINDINGS: NERVE CONDUCTION STUDY: Bilateral peroneal sensory responses were absent.  Bilateral tibial motor responses showed mildly decreased C. map amplitude, with mild central conduction velocity, mildly prolonged F-wave latency.   Left peroneal EDB motor responses was normal. Right peroneal EDB motor response showed mildly decreased C. map amplitude, normal distal latency, conduction velocity.  Right median, ulnar sensory and motor responses were normal   NEEDLE ELECTROMYOGRAPHY:  Selected needle examination was performed at bilateral lower extremity muscles, right upper extremity muscles, I did not perform needle examination of his paraspinal muscles, because he is taking Coumadin  Bilateral tibialis anterior: Increased insertion activity, no spontaneous activity, enlarged motor unit potential, with mildly decreased recruitment patterns  Bilateral tibialis posterior, medial gastrocnemius: Increased insertion activity, 1-2 plus spontaneous activity, enlarged complex motor unit potential, with decreased recruitment patterns.  Bilateral biceps femoris longus, right biceps femoris short head, gluteus medius: Increased insertion activity, no spontaneous activity, enlarged complex motor unit potential, with mildly  decreased recruitment patterns.  Right biceps, deltoid, Normally insertion activity, no spontaneous activity, mild enlarged motor unit potential, with mildly decreased recruitment patterns.     IMPRESSION:    this was an abnormal study. There was electrodiagnostic evidence of bilateral lumbar radiculopathies, involving bilateral L4, L5, S1 myotomes. In addition, there is evidence of chronic right cervical radiculopathy, involving right C5 nerve roots. There was also evidence of length dependent mild axonal peripheral neuropathy   INTERPRETING PHYSICIAN:   Marcial Pacas M.D. Ph.D. [facility] [address], [place], [place] [address] [phone]

## 2014-01-24 ENCOUNTER — Ambulatory Visit (INDEPENDENT_AMBULATORY_CARE_PROVIDER_SITE_OTHER): Payer: Medicare Other | Admitting: Pharmacist

## 2014-01-24 DIAGNOSIS — Z7901 Long term (current) use of anticoagulants: Secondary | ICD-10-CM

## 2014-01-24 DIAGNOSIS — I4891 Unspecified atrial fibrillation: Secondary | ICD-10-CM

## 2014-01-24 LAB — POCT INR: INR: 1.9

## 2014-01-29 ENCOUNTER — Telehealth: Payer: Self-pay | Admitting: Neurology

## 2014-01-29 NOTE — Telephone Encounter (Signed)
Patient's wife calling to state that they have not received a call discussing patient's nerve conduction and EMG test results. Please return call and advise.

## 2014-01-29 NOTE — Telephone Encounter (Signed)
Pt requesting EMG and nerve conduction results. Please advise

## 2014-01-30 ENCOUNTER — Telehealth: Payer: Self-pay | Admitting: Neurology

## 2014-01-30 NOTE — Telephone Encounter (Signed)
Dr. Rexene Alberts has already contacted the pt's wife and took care of this matter.

## 2014-01-30 NOTE — Telephone Encounter (Signed)
I called and talked to his wife at length today. I apologized about the delay in getting back to them. He has evidence of axonal neuropathy which is mild. This could be diabetes in etiology. Furthermore, he has evidence of lumbar radiculopathy starting at L4. He does have a history of degenerative back disease. He had an MRI in January 2013 of his lumbar spine which showed moderate degenerative changes. Furthermore there may be evidence of cervical radiculopathy and cervical degenerative spine disease. He did have a CT of the cervical spine last year after a fall which showed degenerative changes. At this juncture, I would like for him to see a spine specialist. I can make a referral to [facility] [facility] but the patient's wife also reported that the patient has an appointment for his regular followup with his primary care physician and may want to discuss referral with him as well. I think it's a good idea to get Dr. Deboraha Sprang input and we can wait till after 02/12/2014 to make a referral. They will call back to discuss. We will fax reports to the PCP's office today.

## 2014-01-30 NOTE — Telephone Encounter (Signed)
Patient's wife calling wanting results of her husbands recent testing. Please call with results.

## 2014-02-05 ENCOUNTER — Other Ambulatory Visit: Payer: Self-pay | Admitting: Internal Medicine

## 2014-02-06 ENCOUNTER — Other Ambulatory Visit: Payer: Self-pay | Admitting: Pharmacist Clinician (PhC)/ Clinical Pharmacy Specialist

## 2014-02-06 MED ORDER — WARFARIN SODIUM 5 MG PO TABS: ORAL_TABLET | ORAL | Status: DC

## 2014-02-14 ENCOUNTER — Ambulatory Visit (INDEPENDENT_AMBULATORY_CARE_PROVIDER_SITE_OTHER): Payer: Medicare Other | Admitting: Pharmacist Clinician (PhC)/ Clinical Pharmacy Specialist

## 2014-02-14 DIAGNOSIS — Z7901 Long term (current) use of anticoagulants: Secondary | ICD-10-CM

## 2014-02-14 DIAGNOSIS — I4891 Unspecified atrial fibrillation: Secondary | ICD-10-CM

## 2014-02-14 LAB — POCT INR: INR: 2.1

## 2014-02-19 ENCOUNTER — Encounter: Payer: Self-pay | Admitting: Pulmonary Disease

## 2014-02-19 ENCOUNTER — Ambulatory Visit (INDEPENDENT_AMBULATORY_CARE_PROVIDER_SITE_OTHER): Payer: Medicare Other | Admitting: Pulmonary Disease

## 2014-02-19 VITALS — BP 92/62 | HR 64 | Temp 98.4°F | Ht 71.0 in | Wt 205.4 lb

## 2014-02-19 DIAGNOSIS — R059 Cough, unspecified: Secondary | ICD-10-CM | POA: Insufficient documentation

## 2014-02-19 DIAGNOSIS — R05 Cough: Secondary | ICD-10-CM

## 2014-02-19 DIAGNOSIS — J439 Emphysema, unspecified: Secondary | ICD-10-CM | POA: Insufficient documentation

## 2014-02-19 DIAGNOSIS — J438 Other emphysema: Secondary | ICD-10-CM

## 2014-02-19 NOTE — Assessment & Plan Note (Signed)
I suspect his persistent cough was related to to a recent acute bronchitis/acute exacerbation, as well as a cyclical cough mechanism. He tells me that his cough is 80% better, and his wife states it has almost resolved. He may have a small chronic bronchitic component to his underlying lung disease. Will try him on Spiriva to see if things improve further.

## 2014-02-19 NOTE — Progress Notes (Signed)
   Subjective:    Patient ID: CALIBER LANDESS, male    DOB: 21-Aug-1940, 73 y.o.   MRN: [id number]  HPI Patient is a 73 year old male who I've been asked to see for management of COPD and cough. He has a long history of smoking, but has not done so since 1986. He carries the diagnosis of COPD, but tells me that he has not had pulmonary function studies. He has had a recent CT chest that shows emphysema, minimal basilar interstitial disease, and minimal lower lobe bronchiectasis. The patient tells me that he was in his usual state of health with some degree of chronic dyspnea on exertion and told March of this year. At that time, he started having increasing cough with congestion as well as increased shortness of breath. He was felt to have acute bronchitis and was treated with an antibiotic as well as prednisone. Since that time, he has had a cough which has slowly improved over the last 2-3 weeks. He currently has very little mucus production, and when he does it is white and foamy. He feels that his cough is 80% better over the last few weeks. He is denying postnasal drip or reflux disease. He does note dyspnea on exertion bringing groceries in from the car or going up one flight of stairs. It is hard for him to characterize distance, because he feels that his leg issues are more limiting to him than his actual breathing. He is currently on Symbicort with as needed albuterol, and to his knowledge has not tried Spiriva. It should be noted that he is on amiodarone, and the question has been raised whether this may be affecting his pulmonary status.   Review of Systems  Constitutional: Negative for fever and unexpected weight change.  HENT: Positive for sneezing. Negative for congestion, dental problem, ear pain, nosebleeds, postnasal drip, rhinorrhea, sinus pressure, sore throat and trouble swallowing.   Eyes: Negative for redness and itching.  Respiratory: Positive for cough. Negative for chest tightness,  shortness of breath and wheezing.   Cardiovascular: Negative for palpitations and leg swelling.  Gastrointestinal: Negative for nausea and vomiting.  Genitourinary: Negative for dysuria.  Musculoskeletal: Negative for joint swelling.  Skin: Negative for rash.  Neurological: Negative for headaches.  Hematological: Does not bruise/bleed easily.  Psychiatric/Behavioral: Negative for dysphoric mood. The patient is not nervous/anxious.        Objective:   Physical Exam Constitutional:  Overweight male, no acute distress  HENT:  Nares patent without discharge  Oropharynx without exudate, palate and uvula are normal  Eyes:  Perrla, eomi, no scleral icterus  Neck:  No JVD, no TMG  Cardiovascular:  Normal rate, regular rhythm, no rubs or gallops.  No murmurs        Intact distal pulses  Pulmonary :  Normal airflow, no stridor or respiratory distress   No rhonchi, or wheezing, +basilar crackles.   Abdominal:  Soft, nondistended, bowel sounds present.  No tenderness noted.   Musculoskeletal:  Minimal lower extremity edema noted.  Lymph Nodes:  No cervical lymphadenopathy noted  Skin:  No cyanosis noted  Neurologic:  Alert, appropriate, moves all 4 extremities without obvious deficit.         Assessment & Plan:

## 2014-02-19 NOTE — Patient Instructions (Signed)
Stay on symbicort twice a day as you are doing.  Will add spiriva respimat 2 inhalations each am as a trial. Can use albuterol for rescue if needed.   Will schedule for breathing tests in 3-4 weeks, and will see you back in the office on the same day.

## 2014-02-19 NOTE — Assessment & Plan Note (Signed)
The patient has a long history of smoking with chronic symptoms, and is felt to have COPD. He tells me that he has not had pulmonary function studies recently, and I will do these in order to assess his degree of airflow obstruction and also to look at his diffusion capacity. If his diffusion capacity is normal, this has a very high negative predictive value for amiodarone toxicity. However, I suspect his diffusion capacity will not be normal because of his underlying lung disease. It is almost impossible to diagnose acute amiodarone pulmonary issues unless there is acute alveolitis on the CT chest/chest x-ray. 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 The patient has not seen a significant decline in his breathing over a long period of time, but rather was an acute presentation related to his episode of bronchitis in March. I suspect the amiodarone is not a significant issue for him currently, but would like to see his pulmonary function studies before making this determination.

## 2014-03-02 ENCOUNTER — Encounter: Payer: Self-pay | Admitting: *Deleted

## 2014-03-05 ENCOUNTER — Ambulatory Visit (INDEPENDENT_AMBULATORY_CARE_PROVIDER_SITE_OTHER): Payer: Medicare Other | Admitting: Pharmacist Clinician (PhC)/ Clinical Pharmacy Specialist

## 2014-03-05 ENCOUNTER — Encounter: Payer: Self-pay | Admitting: Cardiology

## 2014-03-05 ENCOUNTER — Ambulatory Visit (INDEPENDENT_AMBULATORY_CARE_PROVIDER_SITE_OTHER): Payer: Medicare Other | Admitting: Cardiology

## 2014-03-05 VITALS — BP 92/56 | HR 112 | Ht 72.0 in | Wt 203.9 lb

## 2014-03-05 DIAGNOSIS — I4891 Unspecified atrial fibrillation: Secondary | ICD-10-CM

## 2014-03-05 DIAGNOSIS — Z9861 Coronary angioplasty status: Secondary | ICD-10-CM

## 2014-03-05 DIAGNOSIS — I4819 Other persistent atrial fibrillation: Secondary | ICD-10-CM

## 2014-03-05 DIAGNOSIS — E118 Type 2 diabetes mellitus with unspecified complications: Secondary | ICD-10-CM

## 2014-03-05 DIAGNOSIS — E785 Hyperlipidemia, unspecified: Secondary | ICD-10-CM

## 2014-03-05 DIAGNOSIS — Z79899 Other long term (current) drug therapy: Secondary | ICD-10-CM

## 2014-03-05 DIAGNOSIS — T50904A Poisoning by unspecified drugs, medicaments and biological substances, undetermined, initial encounter: Secondary | ICD-10-CM

## 2014-03-05 DIAGNOSIS — Z7901 Long term (current) use of anticoagulants: Secondary | ICD-10-CM

## 2014-03-05 DIAGNOSIS — I9589 Other hypotension: Secondary | ICD-10-CM

## 2014-03-05 DIAGNOSIS — I219 Acute myocardial infarction, unspecified: Secondary | ICD-10-CM

## 2014-03-05 DIAGNOSIS — I480 Paroxysmal atrial fibrillation: Secondary | ICD-10-CM

## 2014-03-05 DIAGNOSIS — I10 Essential (primary) hypertension: Secondary | ICD-10-CM

## 2014-03-05 DIAGNOSIS — I952 Hypotension due to drugs: Secondary | ICD-10-CM

## 2014-03-05 DIAGNOSIS — I2510 Atherosclerotic heart disease of native coronary artery without angina pectoris: Secondary | ICD-10-CM

## 2014-03-05 DIAGNOSIS — Z9229 Personal history of other drug therapy: Secondary | ICD-10-CM

## 2014-03-05 LAB — POCT INR: INR: 1.7

## 2014-03-05 NOTE — Patient Instructions (Signed)
Stop LOSARTAN  MAY USE LASIX ( FUROSEMIDE ) AS NEEDED FOR SWELLING. KEEP HYDRATED.  Your physician wants you to follow-up in [place]. You will receive a reminder letter in the mail two months in advance. If you don't receive a letter, please call our office to schedule the follow-up appointment.
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
## 2014-03-06 DIAGNOSIS — I952 Hypotension due to drugs: Secondary | ICD-10-CM | POA: Insufficient documentation

## 2014-03-06 NOTE — Assessment & Plan Note (Signed)
Probably a compibnation of recent illness & what sounds like dehydration, he is orthostatic with rapid HR in Afib & has BP in 90s -- will stop losartan to allow BP to increase given his fatigue & dizziness. Continue Verapamil for rate control.  Recommended adequate hydration & eating -- hopefully will improve as he recovers from recent illness.

## 2014-03-06 NOTE — Assessment & Plan Note (Addendum)
F/u TFTs & LFTs. PFTs last year with reduced DLCO due to poor expiration effort. - PFTs are actually being checked by pulmonary medicine. He did have a mildly reduced DLCO last year,  That was felt to be in part related to the patient's effort based on the technologists report.

## 2014-03-06 NOTE — Assessment & Plan Note (Signed)
See above with active hypotension now.

## 2014-03-06 NOTE — Assessment & Plan Note (Signed)
History of MI with no recurrent symptoms or CHF.

## 2014-03-06 NOTE — Assessment & Plan Note (Signed)
Now back on Crestor. Monitored by PCP

## 2014-03-06 NOTE — Assessment & Plan Note (Signed)
On more medications. Monitored by PCP.

## 2014-03-06 NOTE — Assessment & Plan Note (Signed)
He does not note being in Afib now with no notable dyspnea.   On Amiodarone & Verapamil -- warfarin for anticoagulation.  F/u in 3 months or sooner if he becomes symptomatic.  INR is 1.7, so, would not be able to pursue DCCV without TEE.  Encourage adequate hydration & eating to avoid tachycardia.

## 2014-03-06 NOTE — Assessment & Plan Note (Signed)
No active CAD symptoms - normally active.  Does not note dyspnea or angina with Afib & HR of 112. Not on BB with Amiodarone, but on ARB + Verapamil for additional rate control (acceptable with normal EF). On ASA & Statin.  He has not wanted further surveillance ST evaluation due to False + result in 2005.  Will use for diagnostic purposes in setting of possible angina.

## 2014-03-08 ENCOUNTER — Telehealth: Payer: Self-pay | Admitting: Cardiology

## 2014-03-08 MED ORDER — AMIODARONE HCL 200 MG PO TABS: 200.0000 mg | ORAL_TABLET | Freq: Every day | ORAL | Status: DC

## 2014-03-08 NOTE — Telephone Encounter (Signed)
Calling to find out what prescription you were suppose to have called in for him yesterday. Pt did not know the name of the medicine.

## 2014-03-08 NOTE — Telephone Encounter (Signed)
Spoke with wife who said he just needs refills for Amiodarone. Rx refill was sent into patient pharmacy

## 2014-03-08 NOTE — Telephone Encounter (Signed)
Wife called and said the pharmacist told her to call and find out if the doctor called in the medicine to the wrong pharmacy.The name of the medicine was Amiodarone.

## 2014-03-09 ENCOUNTER — Encounter: Payer: Self-pay | Admitting: Internal Medicine

## 2014-03-09 ENCOUNTER — Ambulatory Visit (INDEPENDENT_AMBULATORY_CARE_PROVIDER_SITE_OTHER): Payer: Medicare Other | Admitting: Internal Medicine

## 2014-03-09 VITALS — BP 122/60 | HR 84 | Ht 69.75 in | Wt 200.2 lb

## 2014-03-09 DIAGNOSIS — Z9861 Coronary angioplasty status: Secondary | ICD-10-CM

## 2014-03-09 DIAGNOSIS — K5190 Ulcerative colitis, unspecified, without complications: Secondary | ICD-10-CM

## 2014-03-09 DIAGNOSIS — K2270 Barrett's esophagus without dysplasia: Secondary | ICD-10-CM

## 2014-03-09 DIAGNOSIS — I2510 Atherosclerotic heart disease of native coronary artery without angina pectoris: Secondary | ICD-10-CM

## 2014-03-09 HISTORY — DX: Barrett's esophagus without dysplasia: K22.70

## 2014-03-09 MED ORDER — PANTOPRAZOLE SODIUM 40 MG PO TBEC: 40.0000 mg | DELAYED_RELEASE_TABLET | Freq: Every day | ORAL | Status: DC

## 2014-03-09 MED ORDER — BALSALAZIDE DISODIUM 750 MG PO CAPS: ORAL_CAPSULE | ORAL | Status: DC

## 2014-03-09 NOTE — Assessment & Plan Note (Addendum)
Doing well - continue meds Routine colonoscopy 2016

## 2014-03-09 NOTE — Patient Instructions (Signed)
Please pick up your prescriptions for the colitis and reflux at the pharmacy.  We will send a letter next year about checking up on the esophagus and colon with endoscopy and colonoscopy.  I appreciate the opportunity to care for you. Gatha Mayer, MD, [facility]

## 2014-03-09 NOTE — Assessment & Plan Note (Signed)
egd next yr Stay on ppi

## 2014-03-11 ENCOUNTER — Encounter: Payer: Self-pay | Admitting: Internal Medicine

## 2014-03-11 NOTE — Progress Notes (Signed)
Subjective:    Patient ID: Timothy Mclean, male    DOB: 12/27/1940, 73 y.o.   MRN: [id number]  HPI Very nice man with hx universal UC in remission on Colazal and also has Barrett's esophagus on PPI. No heartburn and no colitis sxs. Needs refills of Colazal - was not taking PPI.  No Known Allergies Outpatient Prescriptions Prior to Visit  Medication Sig Dispense Refill  . amiodarone (PACERONE) 200 MG tablet Take 1 tablet (200 mg total) by mouth daily.  30 tablet  11  . aspirin 81 MG tablet Take 81 mg by mouth daily.      . CRESTOR 20 MG tablet Take 1 tablet by mouth daily.      . cyanocobalamin 2000 MCG tablet Take 2,000 mcg by mouth daily.      . finasteride (PROSCAR) 5 MG tablet Take 5 mg by mouth daily.      . furosemide (LASIX) 20 MG tablet Take 20 mg by mouth daily as needed for fluid.      [name] glipiZIDE (GLUCOTROL XL) 5 MG 24 hr tablet Take 1 tablet by mouth daily.      [name] levothyroxine (SYNTHROID, LEVOTHROID) 100 MCG tablet Take 100 mcg by mouth daily before breakfast.      . metFORMIN (GLUCOPHAGE) 500 MG tablet Take 1,000 mg by mouth 2 (two) times daily.      [name] oxybutynin (DITROPAN) 5 MG tablet Take 5 mg by mouth at bedtime.       . SYMBICORT 160-4.5 MCG/ACT inhaler Inhale 2 puffs into the lungs daily.      . Tamsulosin HCl (FLOMAX) 0.4 MG CAPS Take 0.4 mg by mouth daily.      . vitamin D, CHOLECALCIFEROL, 400 UNITS tablet Take 400 Units by mouth daily.      [name] warfarin (COUMADIN) 5 MG tablet Take 1 tablet by mouth daily or as directed  90 tablet  1  . balsalazide (COLAZAL) 750 MG capsule TAKE 2 CAPSULES BY MOUTH TWICE A DAY  120 capsule  1  . pantoprazole (PROTONIX) 40 MG tablet Take 40 mg by mouth daily.      . verapamil (CALAN-SR) 120 MG CR tablet Take 120 mg by mouth daily.        No facility-administered medications prior to visit.   Past Medical History  Diagnosis Date  . Gout, unspecified   . History of: ST elevation myocardial infarction (STEMI) of inferior wall 1987,  and 97    PTCA of circumflex  . Iron deficiency anemia, unspecified   . Other and unspecified hyperlipidemia   . Esophageal reflux   . Unspecified essential hypertension   . Type II or unspecified type diabetes mellitus without mention of complication, not stated as uncontrolled   . Ulcerative colitis, unspecified   . Other B-complex deficiencies   . Personal history of colonic polyps   . CAD S/P percutaneous coronary angioplasty 1987    Following angioplasty of circumflex and 87 and apparently stent to the circumflex  . S/P CABG x 4 1997    LIMA-LAD, SVG to OM, SVG-dRCA-RPL  . PAF (paroxysmal atrial fibrillation)     Last reported episode 2012; on amiodarone and anticoagulated on warfarin  . Dyslipidemia, goal LDL below 70     Doing better off of Zocor, currently on Lescol   . GERD (gastroesophageal reflux disease)   . DDD (degenerative disc disease)   . Spinal stenosis    Past Surgical History  Procedure Laterality Date  .  Coronary artery bypass graft  11/1995    LIMA-LAD, SVG to OM, SVG-dRCA-RPL  . Cholecystectomy    . Cardiac catheterization  08/17/2003    "False positive stress test " grafts patent; RCA proximal 100% LAD 100% occlusion after normal D1 with 80% ostial as SP1; circumflex patent but OM1 on to all occluded; EF 50-55%  . Cardiac catheterization  11/1995    Preop CABG: LAD 90% at D1, circumflex 100 and OM a 90% proximal, 80% distal  . Nm myoview ltd  2005    Questionable ischemia that is likely either infarct versus artifact; normal  . Colonoscopy    . Flexible sigmoidoscopy    . Esophagogastroduodenoscopy    . Transthoracic echocardiogram  12/03/2008    LVEF =>55% normal study  . Nm myocar perf ejection fraction  07/03/2003    Protocol:Bruce, negative test with scintigraphic evidence of inferoapical scar, diaphragmatic attenuation, moderate ischemia.  . Carotid duplex doppler  12/23/2009    Right&Left ICAs 0-49%, mildly abnormal study,    History   Social  History  . Marital Status: Married    Spouse Name: N/A    Number of Children: 1   Occupational History  . retired    Social History Main Topics  . Smoking status: Former Smoker -- 1.00 packs/day for 32 years    Types: Cigarettes    Quit date: 08/27/1984  . Smokeless tobacco: Never Used  . Alcohol Use: No  . Drug Use: No   Social History Narrative   He is a married father of one. Does not smoke and does not drink. He walks routinely and also does stationary bike. 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 Diabetes Mother     Review of Systems As above    Objective:   Physical Exam WDWN elserly NAD Abd soft and nt Lungs cta Cor S1S2    Assessment & Plan:  UC (ulcerative colitis) Doing well - continue meds Routine colonoscopy 2016  Barrett's esophagus egd next yr Stay on ppi   [id number],BCWUG F, MD

## 2014-03-19 ENCOUNTER — Ambulatory Visit (INDEPENDENT_AMBULATORY_CARE_PROVIDER_SITE_OTHER): Payer: Medicare Other | Admitting: Pulmonary Disease

## 2014-03-19 ENCOUNTER — Encounter: Payer: Self-pay | Admitting: Pulmonary Disease

## 2014-03-19 VITALS — BP 126/60 | HR 82 | Temp 98.2°F | Ht 72.0 in | Wt 201.8 lb

## 2014-03-19 DIAGNOSIS — J438 Other emphysema: Secondary | ICD-10-CM

## 2014-03-19 DIAGNOSIS — R059 Cough, unspecified: Secondary | ICD-10-CM

## 2014-03-19 DIAGNOSIS — Z9861 Coronary angioplasty status: Secondary | ICD-10-CM

## 2014-03-19 DIAGNOSIS — I2510 Atherosclerotic heart disease of native coronary artery without angina pectoris: Secondary | ICD-10-CM

## 2014-03-19 DIAGNOSIS — J439 Emphysema, unspecified: Secondary | ICD-10-CM

## 2014-03-19 DIAGNOSIS — R05 Cough: Secondary | ICD-10-CM

## 2014-03-19 NOTE — Assessment & Plan Note (Signed)
The patient's cough has totally resolved since last visit.

## 2014-03-19 NOTE — Patient Instructions (Signed)
Stop spiriva Try to stay on symbicort 2 puffs am and pm everyday leading up to your breathing studies. Will see you back same day as your breathing tests to discuss results.

## 2014-03-19 NOTE — Progress Notes (Signed)
   Subjective:    Patient ID: Timothy Mclean, male    DOB: [birth year], 73 y.o.   MRN: [id number]  HPI The patient comes in today for followup of his underlying breathing issues. He was scheduled for PFTs with a followup visit the same day, however there was a malfunction of our PFT machine. He wanted to keep his appointment today to followup his breathing. His cough has totally resolved since the last visit, but he has been unable to tolerate Spiriva because of nausea. He has not been taking the Symbicort on a regular basis.   Review of Systems  Constitutional: Negative for fever and unexpected weight change.  HENT: Negative for congestion, dental problem, ear pain, nosebleeds, postnasal drip, rhinorrhea, sinus pressure, sneezing, sore throat and trouble swallowing.   Eyes: Negative for redness and itching.  Respiratory: Negative for cough, chest tightness, shortness of breath and wheezing.   Cardiovascular: Negative for palpitations and leg swelling.  Gastrointestinal: Negative for nausea and vomiting.  Genitourinary: Negative for dysuria.  Musculoskeletal: Negative for joint swelling.  Skin: Negative for rash.  Neurological: Negative for headaches.  Hematological: Does not bruise/bleed easily.  Psychiatric/Behavioral: Negative for dysphoric mood. The patient is not nervous/anxious.        Objective:   Physical Exam Overweight male in no acute distress Nose without purulence or discharge noted Neck without lymphadenopathy or thyromegaly Chest totally clear to auscultation, no wheezing Cardiac exam with regular rate and rhythm Lower extremities without edema, no cyanosis Alert and oriented, moves all 4 extremities.       Assessment & Plan:

## 2014-03-19 NOTE — Assessment & Plan Note (Signed)
The patient has not had his PFTs since the last visit, and has not been able to tolerate Spiriva. I have asked him to discontinue the medication. I've also asked him to stay on the Symbicort compliantly until after his PFTs so we can make the proper of evaluation. I will see him back after his breathing studies, and we can discuss the results and potential therapies.

## 2014-04-04 ENCOUNTER — Encounter: Payer: Self-pay | Admitting: Pulmonary Disease

## 2014-04-04 ENCOUNTER — Ambulatory Visit (INDEPENDENT_AMBULATORY_CARE_PROVIDER_SITE_OTHER): Payer: Medicare Other | Admitting: Pulmonary Disease

## 2014-04-04 VITALS — BP 122/64 | HR 118 | Temp 97.4°F | Ht 69.0 in | Wt 205.0 lb

## 2014-04-04 DIAGNOSIS — J438 Other emphysema: Secondary | ICD-10-CM

## 2014-04-04 DIAGNOSIS — I2510 Atherosclerotic heart disease of native coronary artery without angina pectoris: Secondary | ICD-10-CM

## 2014-04-04 DIAGNOSIS — Z9861 Coronary angioplasty status: Secondary | ICD-10-CM

## 2014-04-04 DIAGNOSIS — J439 Emphysema, unspecified: Secondary | ICD-10-CM

## 2014-04-04 LAB — PULMONARY FUNCTION TEST
DL/VA % pred: 59 %
DL/VA: 2.68 ml/min/mmHg/L
DLCO unc % pred: 55 %
DLCO unc: 17.17 ml/min/mmHg
FEF 25-75 PRE: 2.34 L/s
FEF 25-75 Post: 2.17 L/sec
FEF2575-%Change-Post: -7 %
FEF2575-%PRED-POST: 102 %
FEF2575-%PRED-PRE: 111 %
FEV1-%CHANGE-POST: -1 %
FEV1-%PRED-PRE: 107 %
FEV1-%Pred-Post: 106 %
FEV1-Post: 3.11 L
FEV1-Pre: 3.16 L
FEV1FVC-%Change-Post: 10 %
FEV1FVC-%PRED-PRE: 103 %
FEV6-%CHANGE-POST: -12 %
FEV6-%PRED-POST: 96 %
FEV6-%Pred-Pre: 110 %
FEV6-PRE: 4.19 L
FEV6-Post: 3.68 L
FEV6FVC-%Change-Post: -1 %
FEV6FVC-%PRED-PRE: 105 %
FEV6FVC-%Pred-Post: 103 %
FVC-%Change-Post: -10 %
FVC-%Pred-Post: 93 %
FVC-%Pred-Pre: 104 %
FVC-POST: 3.79 L
FVC-Pre: 4.24 L
POST FEV1/FVC RATIO: 82 %
Post FEV6/FVC ratio: 97 %
Pre FEV1/FVC ratio: 75 %
Pre FEV6/FVC Ratio: 99 %
RV % pred: 106 %
RV: 2.67 L
TLC % pred: 103 %
TLC: 7.05 L

## 2014-04-04 NOTE — Assessment & Plan Note (Signed)
The patient has definite emphysema on his CT chest, but does not have airflow obstruction on his PFTs. Therefore, he does not have COPD. It is unclear to me whether he needs Symbicort has a maintenance inhaler, or whether he can get by with as needed albuterol. I have asked him to discontinue his Symbicort for a few weeks to see if he notices a difference. If he does see a significant change in his breathing, it is okay to keep him on this indefinitely. If he does not see a change, I would keep him off the medication and use albuterol as needed. The patient's cough has essentially resolved, but I am happy to see him again if his cough returns or if his breathing worsens. I have stressed to him the importance of aggressive weight loss and conditioning. Unfortunately the patient could not do the DLCO maneuver, and therefore we cannot exclude the possibility of amiodarone affecting his breathing. However, his chest CT did not show anything to suggest amiodarone pulmonary toxicity.

## 2014-04-04 NOTE — Progress Notes (Signed)
PFT done today. 

## 2014-04-04 NOTE — Patient Instructions (Signed)
Try coming off symbicort for the next 2-3 weeks and see if you see any difference in your breathing.  If you see no difference, stay off the medication.  If you see a difference, get back on the symbicort. Use albuterol as a rescue inhaler only.  Can take 2 puffs up to every 6 hrs if having a hard time breathing. Work on getting your weight down, and getting some kind of exercise. Will be happy to see you again if cough returns, or if you feel your breathing is not doing well.

## 2014-04-04 NOTE — Progress Notes (Signed)
   Subjective:    Patient ID: Timothy Mclean, male    DOB: 10/13/1940, 73 y.o.   MRN: [id number]  HPI The patient comes in today for followup after his recent pulmonary function studies. His cough has essentially resolved, and he feels that his breathing has been stable. He has known emphysema based on his CT chest, but his PFTs today did not show any airflow obstruction. His lung volumes showed no restriction, and the patient could not participate in the DLCO maneuver consistently to get accurate results. I have reviewed all of the test results with he and his wife, and answered all of their questions.   Review of Systems  Constitutional: Negative for fever and unexpected weight change.  HENT: Negative for congestion, dental problem, ear pain, nosebleeds, postnasal drip, rhinorrhea, sinus pressure, sneezing, sore throat and trouble swallowing.   Eyes: Negative for redness and itching.  Respiratory: Positive for cough and shortness of breath. Negative for chest tightness and wheezing.   Cardiovascular: Negative for palpitations and leg swelling.  Gastrointestinal: Negative for nausea and vomiting.  Genitourinary: Negative for dysuria.  Musculoskeletal: Negative for joint swelling.  Skin: Negative for rash.  Neurological: Negative for headaches.  Hematological: Does not bruise/bleed easily.  Psychiatric/Behavioral: Negative for dysphoric mood. The patient is not nervous/anxious.        Objective:   Physical Exam Overweight male in no acute distress Nose without purulence or discharge noted Neck without lymphadenopathy or thyromegaly Lower extremities with minimal edema, no cyanosis Alert and oriented, moves all 4 extremities.       Assessment & Plan:

## 2014-04-18 ENCOUNTER — Encounter: Payer: Self-pay | Admitting: Cardiology

## 2014-05-29 ENCOUNTER — Ambulatory Visit (INDEPENDENT_AMBULATORY_CARE_PROVIDER_SITE_OTHER): Payer: Medicare Other | Admitting: Neurology

## 2014-05-29 ENCOUNTER — Encounter: Payer: Self-pay | Admitting: Neurology

## 2014-05-29 VITALS — BP 142/69 | HR 50 | Temp 97.1°F | Ht 72.0 in | Wt 203.0 lb

## 2014-05-29 DIAGNOSIS — Z9861 Coronary angioplasty status: Secondary | ICD-10-CM

## 2014-05-29 DIAGNOSIS — R278 Other lack of coordination: Secondary | ICD-10-CM

## 2014-05-29 DIAGNOSIS — M6281 Muscle weakness (generalized): Secondary | ICD-10-CM

## 2014-05-29 DIAGNOSIS — E538 Deficiency of other specified B group vitamins: Secondary | ICD-10-CM

## 2014-05-29 DIAGNOSIS — I2510 Atherosclerotic heart disease of native coronary artery without angina pectoris: Secondary | ICD-10-CM

## 2014-05-29 DIAGNOSIS — R269 Unspecified abnormalities of gait and mobility: Secondary | ICD-10-CM

## 2014-05-29 DIAGNOSIS — M5416 Radiculopathy, lumbar region: Secondary | ICD-10-CM

## 2014-05-29 NOTE — Patient Instructions (Addendum)
When you see Dr. Sandi Mariscal next month, please have him recheck your B12 level, your A1c, for diabetes control, and we will recheck your CK level, which was elevated in June. This could be from your cholesterol medication. You have mild neuropathy, most likely from your diabetes.  If your CK level is worse or still elevated as compared to last time, you may have to come off of Crestor. We will call you with the test results and let you know my recommendation.   Continue using your cane for safety.

## 2014-05-29 NOTE — Progress Notes (Signed)
Subjective:    Patient ID: Timothy Mclean is a 73 y.o. male.  HPI     Interim history:  Timothy Mclean is a 73 year old right-handed gentleman with an underlying complex medical history of reflux disease, hypertension, chronic lung disease, vitamin D deficiency, vitamin B12 deficiency, thyroid disease, type 2 diabetes, heart disease, status post MI and PTCA in 1987, status post CABG in 1997, paroxysmal atrial fibrillation on Coumadin, gout, BPH, kidney stone, diverticulosis, shingles in 2009, who presents for follow-up consultation of his lower extremity weakness and numbness. He is accompanied by his wife again today. I first met him on 12/26/2013 at the request of his primary care physician, at which time the patient reported lower extremity pain, weakness and numbness associated with a history of low back pain since 2012. I suggested further workup in the form of EMG nerve conduction studies to the lower extremities, blood work.  Blood work from 12/26/2013 showed a hemoglobin A1c of 7.5, methylmalonic acid was elevated at 469, ANA was negative, CRP was normal, aldolase was borderline at 10.4, CK was 451. EMG and nerve conduction testing from 01/18/2014 showed: bilateral lumbar radiculopathies, involving bilateral L4, L5, S1 myotomes. In addition, there is evidence of chronic right cervical radiculopathy, involving right C5 nerve roots. There was also evidence of length dependent mild axonal peripheral neuropathy. I called his wife with the test results. I suggested evaluation with a spine specialist.  Today, he reports taking B12 orally. He is still on Crestor. He saw a spine specialist at [facility], who did a lower spine injection, which he felt helped. He is not on Lescol which is listed on his medication list. He is no longer on lovastatin either. He feels unchanged but more safer with his walking as he has been using a cane. He has not fallen. He uses stationary bike a little bit more regularly.  He  was found to be intolerant to Lipitor generic and was taken off of it. He was switched to pravastatin but this was also subsequently discontinued with some subjective improvement of his symptoms. He had a mild increase in CK at 258 which subsequently returned to normal after discontinuation of his Zocor. He has known degenerative lower back disease with an MRI showing impingement at L4-5 and L5-S1 level, he has had multiple steroid epidural injections. Because of his vascular risk factors he was started on lovastatin. His last epidural injection was in January 2015 which was not helpful. Lovastatin was discontinued in February 2015. CK level was mildly elevated at 338. His B12 supplement dose was increased recently. He had a CT head without contrast on 01/11/2013 after a fall. This showed generalized atrophy, mild chronic microvascular ischemic changes in the white matter, no acute infarct. He also had a CT cervical spine without contrast at the time which showed moderate degenerative changes, no fracture. MRI lumbar spine without contrast on 08/13/2011 showed lumbar spondylosis and degenerative disease, causing marked impingement at L4-5 and moderate impingement at L5-S1. He was started on Crestor last week, but is still taking the Lovastatin 40 mg daily as well. Your notes indicate in 5/15, that he is on B12 2000 micrograms daily, but the patient is not taking it and indicates, that after the B12 injections were stopped some 6-8 months ago, he has not been taking B12 by mouth.   He has some tingling in the feet. He has been diabetic for about 5 years. He has no significant foot pain. He denies fasciculations and atrophy, and  myoclonus. His legs gives out some and he fatigues easily. He has not had recurrent falls, but did take a fall outside at his niece's ballgame, it was raining and the terrain was hilly and slick and rocky. 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 He  volunteers at the firestation.   He quit smoking in '86 and does not drink alcohol. There is no FHx muscle or nerve disease.    His Past Medical History Is Significant For: Past Medical History  Diagnosis Date  . Gout, unspecified   . Iron deficiency anemia, unspecified   . Other and unspecified hyperlipidemia   . Esophageal reflux   . Unspecified essential hypertension   . Type II or unspecified type diabetes mellitus without mention of complication, not stated as uncontrolled   . Ulcerative colitis, unspecified   . Other B-complex deficiencies   . ST elevation myocardial infarction (STEMI) of inferior wall, subsequent episode of care [facility]    a) PTCA of Cx; b) CABG   . CAD S/P percutaneous coronary angioplasty 1987    angioplasty and apparently stent to the circumflex  . S/P CABG x 4 1997    LIMA-LAD, SVG to OM, SVG-dRCA-RPL; b) false positive stress test --> CATH 1/'05: 100% LAD after D1 that is widely patent, all OM branches of the Circumflex were occluded, pRCA 100% occluded, SVG-PDA patent with retrograde filling of RPL, SVG-OM widely patent to relatively normal OM, LIMA-LAD widely patent.  [name] PAF (paroxysmal atrial fibrillation)     Last reported episode 2012; on amiodarone and anticoagulated on warfarin  . Dyslipidemia, goal LDL below 70     Doing better off of Zocor, currently on Lescol   . GERD (gastroesophageal reflux disease)   . DDD (degenerative disc disease)   . Spinal stenosis   . Barrett's esophagus 03/09/2014  . Personal history of colonic polyps     His Past Surgical History Is Significant For: Past Surgical History  Procedure Laterality Date  . Coronary artery bypass graft  11/1995    LIMA-LAD, SVG to OM, SVG-dRCA-RPL  . Cholecystectomy    . Cardiac catheterization  08/17/2003    "False positive stress test " grafts patent; RCA proximal 100% LAD 100% occlusion after normal D1 with 80% ostial as SP1; circumflex patent but OM1 on to all occluded; EF 50-55%   . Cardiac catheterization  11/1995    Preop CABG: LAD 90% at D1, circumflex 100 and OM a 90% proximal, 80% distal  . Nm myoview ltd  2005    Questionable ischemia that is likely either infarct versus artifact; normal  . Colonoscopy    . Flexible sigmoidoscopy    . Esophagogastroduodenoscopy    . Transthoracic echocardiogram  12/03/2008    LVEF =>55% normal study  . Nm myocar perf ejection fraction  07/03/2003    FALSE POSITIVE TEST; Bruce protocol, negative test with scintigraphic evidence of inferoapical scar, diaphragmatic attenuation, moderate ischemia.  . Carotid duplex doppler  12/23/2009    Right&Left ICAs 0-49%, mildly abnormal study,     His Family History Is Significant For: Family History  Problem Relation Age of Onset  . Diabetes Mother     His Social History Is Significant For: History   Social History  . Marital Status: Married    Spouse Name: N/A    Number of Children: 1  . Years of Education: N/A   Occupational History  . retired    Social History Main  Topics  . Smoking status: Former Smoker -- 1.00 packs/day for 32 years    Types: Cigarettes    Quit date: 08/27/1984  . Smokeless tobacco: Never Used  . Alcohol Use: No  . Drug Use: No  . Sexual Activity: None   Other Topics Concern  . None   Social History Narrative   He is a married father of one. Does not smoke and does not drink. He walks routinely and also does stationary bike. 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 amiodarone (PACERONE) 200 MG tablet Take 1 tablet (200 mg total) by mouth daily.  [name] aspirin 81 MG tablet Take 81 mg by mouth daily.  . balsalazide (COLAZAL) 750 MG capsule TAKE 2 CAPSULES BY MOUTH TWICE A DAY (Patient taking  differently: TAKE 4 CAPSULES BY MOUTH TWICE A DAY)  . BAYER CONTOUR TEST test strip   . CRESTOR 40 MG tablet daily.   . cyanocobalamin 2000 MCG tablet Take 2,000 mcg by mouth daily.  . finasteride (PROSCAR) 5 MG tablet Take 5 mg by mouth daily.  . furosemide (LASIX) 20 MG tablet Take 20 mg by mouth daily as needed for fluid.  [name] glipiZIDE (GLUCOTROL XL) 5 MG 24 hr tablet Take 1 tablet by mouth daily.  [name] levothyroxine (SYNTHROID, LEVOTHROID) 100 MCG tablet Take 100 mcg by mouth daily before breakfast.  . metFORMIN (GLUCOPHAGE) 500 MG tablet Take 1,000 mg by mouth 2 (two) times daily.  [name] oxybutynin (DITROPAN) 5 MG tablet Take 5 mg by mouth 3 (three) times daily.   . pantoprazole (PROTONIX) 40 MG tablet Take 1 tablet (40 mg total) by mouth daily.  . vitamin D, CHOLECALCIFEROL, 400 UNITS tablet Take 400 Units by mouth daily.  [name] warfarin (COUMADIN) 5 MG tablet Take 1 tablet by mouth daily or as directed  . [DISCONTINUED] ciprofloxacin (CIPRO) 500 MG tablet   . [DISCONTINUED] CRESTOR 20 MG tablet Take 1 tablet by mouth daily.  . [DISCONTINUED] SYMBICORT 160-4.5 MCG/ACT inhaler Inhale 2 puffs into the lungs daily.  . [DISCONTINUED] Tamsulosin HCl (FLOMAX) 0.4 MG CAPS Take 0.4 mg by mouth daily.  . [DISCONTINUED] Tiotropium Bromide Monohydrate (SPIRIVA RESPIMAT) 2.5 MCG/ACT AERS Inhale 1 puff into the lungs every other day.  :  Review of Systems:  Out of a complete 14 point review of systems, all are reviewed and negative with the exception of these symptoms as listed below:   Review of Systems  Musculoskeletal: Positive for gait problem.    Objective:  Neurologic Exam  Physical Exam Physical Examination:   Filed Vitals:   05/29/14 1436  BP: 142/69  Pulse: 50  Temp: 97.1 F (36.2 C)     General Examination: The patient is a very pleasant 73 y.o. male in no acute distress. He appears well-developed and well-nourished and well groomed. He is overweight.   HEENT: Normocephalic,  atraumatic, pupils are equal, round and reactive to light and accommodation. Funduscopic exam is normal with sharp disc margins noted. Extraocular tracking is good without limitation to gaze excursion or nystagmus noted. Normal smooth pursuit is noted. Hearing seems mildly impaired. Face is symmetric with normal facial animation and normal facial sensation. Speech is clear with no dysarthria noted. There is no hypophonia. There is no lip, neck/head, jaw or  voice tremor. Neck is supple with full range of passive and active motion. There are no carotid bruits on auscultation. Oropharynx exam reveals: mild mouth dryness, adequate dental hygiene and moderate airway crowding, due to large tongue and narrow appearing airway. Mallampati is class III. Tongue protrudes centrally and palate elevates symmetrically.   Chest: Clear to auscultation without wheezing, rhonchi or crackles noted.  Heart: S1+S2+0, regular and normal without murmurs, rubs or gallops noted.   Abdomen: Soft, non-tender and non-distended with normal bowel sounds appreciated on auscultation.  Extremities: There is trace pitting edema in the distal lower extremities bilaterally. Pedal pulses are intact.  Skin: Warm and dry without trophic changes noted. There are no varicose veins.  Musculoskeletal: exam reveals no obvious joint deformities, tenderness or joint swelling or erythema.   Neurologically:  Mental status: The patient is awake, alert and oriented in all 4 spheres. His immediate and remote memory, attention, language skills and fund of knowledge are appropriate. There is no evidence of aphasia, agnosia, apraxia or anomia. Speech is clear with normal prosody and enunciation. Thought process is linear. Mood is normal and affect is normal.  Cranial nerves II - XII are as described above under HEENT exam. In addition: shoulder shrug is normal with equal shoulder height noted. Motor exam: Normal bulk, strength and tone is noted in the  upper extremities. He has mildly decreased bulk in the lower extremities with no fasciculation or focal atrophy noted no trophic changes were noted. He has mild hip flexor weakness right more than left and mild knee flexor weakness on the left. There is no drift, tremor or rebound, with the exception of possible slight left upper extremity rebound. Romberg is positive. Reflexes are 2+ throughout with absent ankle jerks noted. Babinski: Toes are flexor bilaterally. Fine motor skills and coordination: intact with normal finger taps, normal hand movements, normal rapid alternating patting, normal foot taps and normal foot agility.  Cerebellar testing: No dysmetria or intention tremor on finger to nose testing. Heel to shin is unremarkable bilaterally. There is no truncal or gait ataxia.  Sensory exam: intact to light touch, pinprick, vibration, temperature sense in the upper extremities but decrease to pinprick and vibration in the lower extremities distally up to the ankles bilaterally and symmetrical, unchanged.  Gait, station and balance: He stands with mild difficulty. He is not able to stand narrow based and stands wide-based and slightly bowlegged. No veering to one side is noted. No leaning to one side is noted. Posture is age-appropriate. He walks slightly wide-based. He is not able to perform tandem walk. He uses a cane and appears to be more stable with a cane.  Assessment and Plan:   In summary, Timothy Mclean is a very pleasant 73 year old male with an underlying complex medical history of reflux disease, hypertension, chronic lung disease, vitamin D deficiency, vitamin B12 deficiency, thyroid disease, type 2 diabetes, heart disease, status post MI and PTCA in 1987, status post CABG in 1997, paroxysmal atrial fibrillation on Coumadin, gout, BPH, kidney stone, diverticulosis, shingles in 2009, who has a three-year history of progressive muscle weakness and gait disorder. He has a gait disorder, most  likely secondary to peripheral neuropathy, degenerative lower spine disease, mild muscle weakness, and workup has shown mild axonal neuropathy, most likely diabetic in etiology, and CK level in June was elevated in the 400s. He was on Crestor and as I understand also on lovastatin at the time which since then has been discontinued. In the  past he had to discontinue Lipitor and Zocor as well. We will recheck CK level today. His hemoglobin A1c was 7.5 in June. He has an appointment with his primary care physician next month and is encouraged to have his A1c and B12 levels rechecked at the time. He has been taking oral vitamin B12. We will call him with his CK level from today. He has started using a cane which does appear to have helped him for gait safety. He has seen a spine specialist at [facility] and had a injection in his lower back which he felt has helped. He has also been using the stationary bike more consistently. He is encouraged to walk within his limitation regularly and use his cane for safety. He is encouraged to continue using the stationary bike as well. If his CK level is still elevated a worse from last time he may have to come off of Crestor. We will let him know. I will see him back routinely in about 3 or 4 months, sooner if the need arises. I answered all his questions today and the patient and his wife were in agreement.

## 2014-05-30 ENCOUNTER — Encounter: Payer: Self-pay | Admitting: *Deleted

## 2014-05-30 LAB — CK: Total CK: 145 U/L (ref 24–204)

## 2014-05-30 NOTE — Progress Notes (Signed)
Quick Note:  Please advise patient or his wife that his muscle enzyme called CK was normal which is much better than when we last checked it 5 months ago. It looks like he can continue with Crestor without problems at this time. Star Age, MD, PhD [facility] ([facility]  ______

## 2014-06-11 ENCOUNTER — Encounter: Payer: Self-pay | Admitting: Cardiology

## 2014-06-11 ENCOUNTER — Ambulatory Visit (INDEPENDENT_AMBULATORY_CARE_PROVIDER_SITE_OTHER): Payer: Medicare Other | Admitting: Pharmacist Clinician (PhC)/ Clinical Pharmacy Specialist

## 2014-06-11 ENCOUNTER — Ambulatory Visit (INDEPENDENT_AMBULATORY_CARE_PROVIDER_SITE_OTHER): Payer: Medicare Other | Admitting: Cardiology

## 2014-06-11 VITALS — BP 142/88 | HR 90 | Ht 72.0 in | Wt 206.0 lb

## 2014-06-11 DIAGNOSIS — E785 Hyperlipidemia, unspecified: Secondary | ICD-10-CM

## 2014-06-11 DIAGNOSIS — Z951 Presence of aortocoronary bypass graft: Secondary | ICD-10-CM

## 2014-06-11 DIAGNOSIS — E118 Type 2 diabetes mellitus with unspecified complications: Secondary | ICD-10-CM

## 2014-06-11 DIAGNOSIS — I4819 Other persistent atrial fibrillation: Secondary | ICD-10-CM

## 2014-06-11 DIAGNOSIS — I481 Persistent atrial fibrillation: Secondary | ICD-10-CM

## 2014-06-11 DIAGNOSIS — I2510 Atherosclerotic heart disease of native coronary artery without angina pectoris: Secondary | ICD-10-CM

## 2014-06-11 DIAGNOSIS — Z9861 Coronary angioplasty status: Secondary | ICD-10-CM

## 2014-06-11 DIAGNOSIS — Z9229 Personal history of other drug therapy: Secondary | ICD-10-CM

## 2014-06-11 DIAGNOSIS — I10 Essential (primary) hypertension: Secondary | ICD-10-CM

## 2014-06-11 DIAGNOSIS — I4891 Unspecified atrial fibrillation: Secondary | ICD-10-CM

## 2014-06-11 DIAGNOSIS — Z7901 Long term (current) use of anticoagulants: Secondary | ICD-10-CM

## 2014-06-11 DIAGNOSIS — Z09 Encounter for follow-up examination after completed treatment for conditions other than malignant neoplasm: Secondary | ICD-10-CM

## 2014-06-11 LAB — POCT INR: INR: 1.2

## 2014-06-11 MED ORDER — VERAPAMIL HCL ER 120 MG PO TBCR: 120.0000 mg | EXTENDED_RELEASE_TABLET | Freq: Every day | ORAL | Status: DC

## 2014-06-11 NOTE — Patient Instructions (Signed)
Your physician recommends that you schedule a follow-up appointment in: 6 Months   Your physician has recommended you make the following change in your medication: Start Verapamil 120 mg daily

## 2014-06-13 ENCOUNTER — Encounter: Payer: Self-pay | Admitting: Cardiology

## 2014-06-13 ENCOUNTER — Other Ambulatory Visit: Payer: Self-pay | Admitting: Internal Medicine

## 2014-06-13 NOTE — Progress Notes (Signed)
PCP: Marylene Land, MD  Clinic Note: Chief Complaint  Patient presents with  . Follow-up    6 month f/u; no cardiac complaints  . Leg Pain    fell about 9 months-1 year ago-legs still receovering  . Coronary Artery Disease    MV CAD -- CABG X 4   . Atrial Fibrillation    Paroxysmal - on Amiodarone    HPI: Timothy Mclean is a 73 y.o. male with a PMH below who presents today for 3 month f/u CAD-CABG & PAF. Last visit, he was noted to be in A. Fib with portal and RPR. He was on verapamil, but for some reason that was stopped. She is now returning to reassess. At that time were unable to cardiovert because he was subtherapeutic with INR.  Past Medical History  Diagnosis Date  . Gout, unspecified   . Iron deficiency anemia, unspecified   . Other and unspecified hyperlipidemia   . Esophageal reflux   . Unspecified essential hypertension   . Type II or unspecified type diabetes mellitus without mention of complication, not stated as uncontrolled   . Ulcerative colitis, unspecified   . Other B-complex deficiencies   . ST elevation myocardial infarction (STEMI) of inferior wall, subsequent episode of care [place]    a) PTCA of Cx; b) CABG   . CAD S/P percutaneous coronary angioplasty 1987    angioplasty and  STENT- Cx; MYOVIEW 1/'05: ? Ischemia vs. artifact --> sent for cath  . S/P CABG x 4 1997    a) LIMA-LAD, SVG to OM, SVG-dRCA-RPL; b) FALSE + MYOVIEW --> CATH 1/'05: 100% LAD after widelly patent D1, all Cx OM branches 100%, pRCA 100%, SVG-PDA patent w/ retrograde filling of RPL, SVG-OM widely patent ~ normal OM, LIMA-LAD patent.  [name] PAF (paroxysmal atrial fibrillation)     Last reported episode 2012; on amiodarone and anticoagulated on warfarin  . Dyslipidemia, goal LDL below 70     Doing better off of Zocor, currently on Lescol   . GERD (gastroesophageal reflux disease)   . DDD (degenerative disc disease)   . Spinal stenosis   . Barrett's esophagus 03/09/2014  . Personal  history of colonic polyps     Prior Cardiac Evaluation and Past Surgical History: Reviewed.  Interval History: Today, he feels much better, partially because he is recovered from his flu-like illness.  Is back to walking every day and using a stationary bicycle 1-2 days per week. He is mostly Limited by knee pain and pain from his fall earlier this year.  He really doesn't notice it he is in atrial fibrillation unless his heart rate goes faster. No real sense of irregular beats.  No resting or exertional dyspnea or chest tightness/pressure.  No heart failure symptoms of PND, orthopnea or edema. No lightheadedness, dizziness, weakness, syncope/near syncope, or TIA/amaurosis fugax symptoms  ROS: A comprehensive was performed. Review of Systems  Constitutional: Negative for weight loss and malaise/fatigue.       No longer feeling fatigued, because he has gotten over his illness.; Back to walking almost every day and using a stationary bike 1-2 days a week.  HENT: Positive for congestion. Negative for nosebleeds.   Respiratory: Negative for cough, shortness of breath and wheezing.        His cough has resolved  Cardiovascular: Negative for claudication.  Gastrointestinal: Negative for blood in stool and melena.  Genitourinary: Negative for hematuria.  Musculoskeletal: Positive for joint pain.  Bilateral knee pains. Still has pain from his fall ~9 months ago  Neurological: Negative for dizziness, sensory change, speech change, focal weakness, seizures, loss of consciousness and headaches.  Endo/Heme/Allergies: Does not bruise/bleed easily.  Psychiatric/Behavioral: Negative for depression and memory loss. The patient is not nervous/anxious and does not have insomnia.   All other systems reviewed and are negative.   Current Outpatient Prescriptions on File Prior to Visit  Medication Sig Dispense Refill  . amiodarone (PACERONE) 200 MG tablet Take 1 tablet (200 mg total) by mouth daily. 30  tablet 11  . aspirin 81 MG tablet Take 81 mg by mouth daily.    . balsalazide (COLAZAL) 750 MG capsule TAKE 2 CAPSULES BY MOUTH TWICE A DAY (Patient taking differently: TAKE 4 CAPSULES BY MOUTH TWICE A DAY) 120 capsule 1  . BAYER CONTOUR TEST test strip   2  . CRESTOR 40 MG tablet daily.     . cyanocobalamin 2000 MCG tablet Take 2,000 mcg by mouth daily.    [name] glipiZIDE (GLUCOTROL XL) 5 MG 24 hr tablet Take 1 tablet by mouth daily.    . metFORMIN (GLUCOPHAGE) 500 MG tablet Take 1,000 mg by mouth 2 (two) times daily.    [name] oxybutynin (DITROPAN) 5 MG tablet Take 5 mg by mouth 3 (three) times daily.     . vitamin D, CHOLECALCIFEROL, 400 UNITS tablet Take 400 Units by mouth daily.    [name] warfarin (COUMADIN) 5 MG tablet Take 1 tablet by mouth daily or as directed 90 tablet 1   No current facility-administered medications on file prior to visit.   ALLERGIES REVIEWED IN EPIC -- No change SOCIAL AND FAMILY HISTORY REVIEWED IN EPIC -- No change  Wt Readings from Last 3 Encounters:  06/11/14 206 lb (93.441 kg)  05/29/14 203 lb (92.08 kg)  04/04/14 205 lb (92.987 kg)    PHYSICAL EXAM BP 142/88 mmHg  Pulse 90  Ht 6' (1.829 m)  Wt 206 lb (93.441 kg)  BMI 27.93 kg/m2 General appearance: alert - but spent most of the visit lying down with eyes closed; looks tired, but non-toxic. Cooperative, appears stated age, no distress Neck: no adenopathy, no carotid bruit and no JVD Lungs: clear to auscultation bilaterally with minimal interstitial sounds & expiratory rhonchi; normal percussion bilaterally and non-labored Heart: Irregularly Irregular with regular late; S1& S2 normal, 1-2/6 SEM @ RUSB--> carotids; No click, rub or gallop with non-displaced PMI Abdomen: soft, non-tender; bowel sounds normal; no masses, no organomegaly; Extremities: extremities normal, atraumatic, no cyanosis, mild LE edema Pulses: 2+ and symmetric;  Neurologic: Mental status: Alert, oriented, thought content appropriate;  Grossly normal   Adult ECG Report  Rate: 100 ;  Rhythm: atrial fibrillation, premature ventricular contractions (PVC) and vs. aberrantly conducted complexes; NS ST-T changes.  Narrative Interpretation: Rate has decreased since last EKG  Recent Labs:    He did not get the labs that were supposed to be checked at last visit checked. Lab Results  Component Value Date   TSH 1.570 12/26/2013   Lab Results  Component Value Date   INR 1.2 06/11/2014   INR 1.7 03/05/2014   INR 2.1 02/14/2014    ASSESSMENT / PLAN: CAD S/P PTCA-PCI then CABG x4 Thankfully, he has no active symptoms of angina or heart failure. He is routinely active.  Not on a BB because he is on amiodarone. He is had been on an ARB - that was stopped for hypotension. Pending BP effect of restarting Verapamil, may  want to restart ARB.  Verapamil was stopped, but I will restart today --> Acceptable with normal EF.  On aspirin statin.  Per his request, no further surveillance stress tests based on false-positive test in 2005. We will only do diagnostic stress test for possible angina symptoms..  Persistent atrial fibrillation Unfortunately, he is back in A. Fib. I'm not sure if he has remained in A. Fib since his last visit.  Subtherapeutic INR -- will need dose adjustment.  Continue amiodarone for rate/rhythm control for now. - TSH normal. He was supposed to have LFTs and chemistry panel checked at his last visit, this was not done.   We don't have a baseline PFT for him and when he started the amiodarone therefore checking it now in the absence of pulmonary symptoms would not be prudent.  I will restart verapamil for additional rate control.   Essential hypertension His blood pressure slightly elevated today. I will restart verapamil. I stopped his ARB last visit due to hypotension. If BP still elevated - may want to consider restarting ARB in a patient with DM-2 & CAD.  Dyslipidemia, goal LDL below 70 On Crestor.  Labs being followed by PCP. He should be having his labs checked soon.  Type 2 diabetes mellitus with complication - CAD On oral medications. Monitored by PCP.    Orders Placed This Encounter  Procedures  . EKG 12-Lead   Meds ordered this encounter  Medications  . verapamil (CALAN-SR) 120 MG CR tablet    Sig: Take 1 tablet (120 mg total) by mouth daily.    Dispense:  30 tablet    Refill:  9    Followup: 6 months   Amazing Cowman W, M.D., M.S. Interventional Cardiologist   Pager # [phone]

## 2014-06-13 NOTE — Assessment & Plan Note (Addendum)
His blood pressure slightly elevated today. I will restart verapamil. I stopped his ARB last visit due to hypotension. If BP still elevated - may want to consider restarting ARB in a patient with DM-2 & CAD.

## 2014-06-13 NOTE — Assessment & Plan Note (Signed)
On Crestor. Labs being followed by PCP. He should be having his labs checked soon.

## 2014-06-13 NOTE — Assessment & Plan Note (Addendum)
Thankfully, he has no active symptoms of angina or heart failure. He is routinely active.  Not on a BB because he is on amiodarone. He is had been on an ARB - that was stopped for hypotension. Pending BP effect of restarting Verapamil, may want to restart ARB.  Verapamil was stopped, but I will restart today --> Acceptable with normal EF.  On aspirin statin.  Per his request, no further surveillance stress tests based on false-positive test in 2005. We will only do diagnostic stress test for possible angina symptoms.[name]

## 2014-06-13 NOTE — Assessment & Plan Note (Signed)
On oral medications. Monitored by PCP.

## 2014-06-13 NOTE — Assessment & Plan Note (Signed)
Unfortunately, he is back in A. Fib. I'm not sure if he has remained in A. Fib since his last visit.  Subtherapeutic INR -- will need dose adjustment.  Continue amiodarone for rate/rhythm control for now. - TSH normal. He was supposed to have LFTs and chemistry panel checked at his last visit, this was not done.   We don't have a baseline PFT for him and when he started the amiodarone therefore checking it now in the absence of pulmonary symptoms would not be prudent.  I will restart verapamil for additional rate control.

## 2014-06-25 ENCOUNTER — Ambulatory Visit (INDEPENDENT_AMBULATORY_CARE_PROVIDER_SITE_OTHER): Payer: Medicare Other | Admitting: Pharmacist Clinician (PhC)/ Clinical Pharmacy Specialist

## 2014-06-25 DIAGNOSIS — Z7901 Long term (current) use of anticoagulants: Secondary | ICD-10-CM

## 2014-06-25 DIAGNOSIS — I4891 Unspecified atrial fibrillation: Secondary | ICD-10-CM

## 2014-06-25 LAB — POCT INR: INR: 3.3

## 2014-07-02 ENCOUNTER — Encounter: Payer: Self-pay | Admitting: Cardiology

## 2014-07-04 ENCOUNTER — Telehealth: Payer: Self-pay | Admitting: *Deleted

## 2014-07-04 NOTE — Telephone Encounter (Signed)
Fax letter to Dr Sandi Mariscal ,per Dr Ellyn Hack Letter was in response to a letter from Dr Sandi Mariscal- concerning medication

## 2014-07-16 ENCOUNTER — Encounter: Payer: Self-pay | Admitting: Cardiology

## 2014-07-16 ENCOUNTER — Ambulatory Visit (INDEPENDENT_AMBULATORY_CARE_PROVIDER_SITE_OTHER): Payer: Medicare Other | Admitting: Pharmacist Clinician (PhC)/ Clinical Pharmacy Specialist

## 2014-07-16 DIAGNOSIS — I4891 Unspecified atrial fibrillation: Secondary | ICD-10-CM

## 2014-07-16 DIAGNOSIS — Z7901 Long term (current) use of anticoagulants: Secondary | ICD-10-CM

## 2014-07-16 LAB — POCT INR: INR: 2.7

## 2014-07-23 ENCOUNTER — Telehealth: Payer: Self-pay | Admitting: Cardiology

## 2014-07-23 ENCOUNTER — Telehealth: Payer: Self-pay | Admitting: Pharmacist Clinician (PhC)/ Clinical Pharmacy Specialist

## 2014-07-23 NOTE — Telephone Encounter (Signed)
Pt wife called, he has dental appt for extraction of 4 teeth on 1/14.  Told DDS that he was on warfarin, was told not to worry, did not need to stop medication.    Advised wife that he can hold night before procedure, then night of, if bleeding a problem.  Otherwise stay on normal dose.

## 2014-07-23 NOTE — Telephone Encounter (Signed)
Received records from Dr Derinda Late for appointment with Dr Ellyn Hack on 09/18/14.  Records given to [facility] (medical records) for Dr Allison Quarry schedule on 09/18/14. lp

## 2014-08-13 ENCOUNTER — Ambulatory Visit: Payer: Medicare Other | Admitting: Pharmacist Clinician (PhC)/ Clinical Pharmacy Specialist

## 2014-08-14 ENCOUNTER — Ambulatory Visit (INDEPENDENT_AMBULATORY_CARE_PROVIDER_SITE_OTHER): Payer: Medicare Other | Admitting: Pharmacist Clinician (PhC)/ Clinical Pharmacy Specialist

## 2014-08-14 DIAGNOSIS — Z7901 Long term (current) use of anticoagulants: Secondary | ICD-10-CM

## 2014-08-14 LAB — POCT INR: INR: 2.4

## 2014-08-22 ENCOUNTER — Other Ambulatory Visit: Payer: Self-pay | Admitting: Internal Medicine

## 2014-08-25 ENCOUNTER — Other Ambulatory Visit: Payer: Self-pay | Admitting: Pharmacist Clinician (PhC)/ Clinical Pharmacy Specialist

## 2014-08-25 MED ORDER — WARFARIN SODIUM 5 MG PO TABS: ORAL_TABLET | ORAL | Status: DC

## 2014-09-18 ENCOUNTER — Ambulatory Visit (INDEPENDENT_AMBULATORY_CARE_PROVIDER_SITE_OTHER): Payer: Medicare Other | Admitting: Cardiology

## 2014-09-18 ENCOUNTER — Ambulatory Visit (INDEPENDENT_AMBULATORY_CARE_PROVIDER_SITE_OTHER): Payer: Medicare Other

## 2014-09-18 VITALS — BP 144/80 | HR 79 | Ht 72.0 in | Wt 196.0 lb

## 2014-09-18 DIAGNOSIS — Z7901 Long term (current) use of anticoagulants: Secondary | ICD-10-CM

## 2014-09-18 DIAGNOSIS — I481 Persistent atrial fibrillation: Secondary | ICD-10-CM

## 2014-09-18 DIAGNOSIS — Z951 Presence of aortocoronary bypass graft: Secondary | ICD-10-CM

## 2014-09-18 DIAGNOSIS — Z9229 Personal history of other drug therapy: Secondary | ICD-10-CM

## 2014-09-18 DIAGNOSIS — I4819 Other persistent atrial fibrillation: Secondary | ICD-10-CM

## 2014-09-18 DIAGNOSIS — I2119 ST elevation (STEMI) myocardial infarction involving other coronary artery of inferior wall: Secondary | ICD-10-CM

## 2014-09-18 DIAGNOSIS — E785 Hyperlipidemia, unspecified: Secondary | ICD-10-CM

## 2014-09-18 DIAGNOSIS — I10 Essential (primary) hypertension: Secondary | ICD-10-CM

## 2014-09-18 DIAGNOSIS — Z09 Encounter for follow-up examination after completed treatment for conditions other than malignant neoplasm: Secondary | ICD-10-CM

## 2014-09-18 DIAGNOSIS — I4891 Unspecified atrial fibrillation: Secondary | ICD-10-CM

## 2014-09-18 DIAGNOSIS — I2510 Atherosclerotic heart disease of native coronary artery without angina pectoris: Secondary | ICD-10-CM

## 2014-09-18 LAB — POCT INR: INR: 4.4

## 2014-09-18 MED ORDER — VERAPAMIL HCL ER 120 MG PO TBCR: 120.0000 mg | EXTENDED_RELEASE_TABLET | Freq: Every day | ORAL | Status: DC

## 2014-09-18 MED ORDER — AMIODARONE HCL 200 MG PO TABS
200.0000 mg | ORAL_TABLET | Freq: Every day | ORAL | Status: DC
Start: 2014-09-18 — End: 2015-10-11

## 2014-09-18 MED ORDER — WARFARIN SODIUM 5 MG PO TABS: ORAL_TABLET | ORAL | Status: DC

## 2014-09-18 NOTE — Patient Instructions (Addendum)
NO CHANGE MEDICATION  PROTIME 4.4  TODAY  Your physician wants you to follow-up in 6 MONTH Timothy Mclean.  You will receive a reminder letter in the mail two months in advance. If you don't receive a letter, please call our office to schedule the follow-up appointment.

## 2014-09-19 ENCOUNTER — Encounter: Payer: Self-pay | Admitting: Cardiology

## 2014-09-19 NOTE — Assessment & Plan Note (Signed)
Stable blood pressure. Verapamil doesn't seem to be doing much for his pressures it is helping his rate. He has had some hypotension in the past and therefore I will hold off on restarting ARB.

## 2014-09-19 NOTE — Assessment & Plan Note (Signed)
No anginal symptoms. No heart failure symptoms. Remains quite active as much as his back would tolerate.  Not on beta blocker while on amiodarone 10 verapamil. Acceptable with normal EF.  ARB was held due to hypotension. We may go to restart if his pressures remain stable   On aspirin statin  Per previous discussion. No routine screening tests for ischemia in the absence of symptoms based on prior false positive test.

## 2014-09-19 NOTE — Assessment & Plan Note (Signed)
Thankfully no active symptoms of angina or heart failure. His last MI was in 1997 when he underwent CABG. All branches of the circumflex the native aspect are occluded. Appropriate grafts are patent however. Some of the smaller branch vessels may not have been the vascularized.

## 2014-09-19 NOTE — Assessment & Plan Note (Signed)
On warfarin. Stable

## 2014-09-19 NOTE — Assessment & Plan Note (Signed)
He should have LFTs checked with his lipid panel. PFTs were last checked last fall. I will reorder in six-month followup. We may need to consider alternative therapy if the DLCO continues to decrease. Unfortunately I really do about baseline for him.

## 2014-09-19 NOTE — Assessment & Plan Note (Signed)
His last nuclear stress test was thought to be false positive.  He does not want another stress test unless his symptoms, or would prefer simply going to the Cath Lab If he has symptoms.

## 2014-09-19 NOTE — Assessment & Plan Note (Signed)
Now on Crestor. Labs are monitored by PCP. I have not seen recent labs.
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
## 2014-09-19 NOTE — Assessment & Plan Note (Signed)
Now back in sinus rhythm. Continues to be on amiodarone PFTs are quite concerning with the DLCO levels, we'll continue to monitor him for next year. We may end up needing to switch to a different medication if the levels continued to worsen. I will need to reorder PFTs after a six-month followup. He is anticoagulated and brought her in. Rate is much improved with verapamil back on board.

## 2014-09-20 ENCOUNTER — Ambulatory Visit (INDEPENDENT_AMBULATORY_CARE_PROVIDER_SITE_OTHER): Payer: Medicare Other | Admitting: Pharmacist Clinician (PhC)/ Clinical Pharmacy Specialist

## 2014-09-20 DIAGNOSIS — Z7901 Long term (current) use of anticoagulants: Secondary | ICD-10-CM

## 2014-09-21 NOTE — Addendum Note (Signed)
Addended by: Vennie Homans on: 09/21/2014 08:00 AM   Modules accepted: Orders

## 2014-09-27 ENCOUNTER — Ambulatory Visit (INDEPENDENT_AMBULATORY_CARE_PROVIDER_SITE_OTHER): Payer: Medicare Other | Admitting: Neurology

## 2014-09-27 ENCOUNTER — Encounter: Payer: Self-pay | Admitting: Neurology

## 2014-09-27 VITALS — BP 132/78 | HR 65 | Temp 97.5°F | Resp 18 | Ht 72.0 in | Wt 191.0 lb

## 2014-09-27 DIAGNOSIS — I480 Paroxysmal atrial fibrillation: Secondary | ICD-10-CM

## 2014-09-27 DIAGNOSIS — R269 Unspecified abnormalities of gait and mobility: Secondary | ICD-10-CM | POA: Diagnosis not present

## 2014-09-27 DIAGNOSIS — R634 Abnormal weight loss: Secondary | ICD-10-CM

## 2014-09-27 DIAGNOSIS — R278 Other lack of coordination: Secondary | ICD-10-CM | POA: Diagnosis not present

## 2014-09-27 DIAGNOSIS — E538 Deficiency of other specified B group vitamins: Secondary | ICD-10-CM

## 2014-09-27 DIAGNOSIS — Z9181 History of falling: Secondary | ICD-10-CM

## 2014-09-27 DIAGNOSIS — R413 Other amnesia: Secondary | ICD-10-CM

## 2014-09-27 DIAGNOSIS — I2510 Atherosclerotic heart disease of native coronary artery without angina pectoris: Secondary | ICD-10-CM | POA: Diagnosis not present

## 2014-09-27 NOTE — Patient Instructions (Addendum)
We will request Physical therapy outpatient.   Drink more water, reduce soda intake, use your cane at all times. Try protein milkshakes as a supplement.   Follow up in 4 months.

## 2014-09-27 NOTE — Progress Notes (Signed)
Subjective:    Patient ID: Timothy Mclean is a 74 y.o. male.  HPI     Interim history:   Timothy Mclean is a 74 year old right-handed gentleman with an underlying complex medical history of reflux disease, hypertension, chronic lung disease, vitamin D deficiency, vitamin B12 deficiency, thyroid disease, type 2 diabetes, heart disease, status post MI and PTCA in 1987, status post CABG in 1997, paroxysmal atrial fibrillation on Coumadin, gout, BPH, kidney stone, diverticulosis, shingles in 2009, who presents for follow-up consultation of his lower extremity weakness and numbness. He is accompanied by his wife again today. I last saw him on 05/29/14, at which time he reported taking B12 orally. He was on Crestor. He saw a spine specialist at [facility], who did a lower spine injection, which he felt helped. He was not on Lescol or lovastatin. He felt a little better with his walking as he was using a cane. He had not fallen. He was using his stationary bike a little bit more regularly. I felt that he had a gait disorder secondary to peripheral neuropathy, degenerative lower spine disease, possibly medication side effects, his hemoglobin A1c was suboptimal in June 2015 at 7.5. He was advised to see his primary care physician for recheck on his A1c and B12 levels. He was taking his B12. I suggested we recheck his CK levels which were done on 05/29/2014. This came back as normal and we advised him of the test results in writing.    Today, 09/27/2014: He is able to give his history but additional information is provided by his wife. He reports, doing fairly well. He had developed a fungal infection of his foot and was treated for this past primary care physician. His wife reports that she has noted that in the last few months he has had some memory loss and occasional confusion. This she feels may be in keeping with his age but she wants to make sure. She also reports that his appetite may not be as good. He has lost a  little bit of weight. He had his checkup with Dr. Sandi Mariscal on 07/13/2014. We asked them to fax blood test results and I was able to review this during this visit. CK was 154, vitamin B12 was 1392, methylmalonic acid was 90, CBC with differential was unremarkable, CMP showed a glucose of 114 and otherwise unremarkable, uric acid normal, CRP normal, TSH elevated at 11, triglycerides 118, LDL within normal. He does not drink enough water he admits. He has been using his cane for outside but not inside the house. He denies any recent falls. He likes to drink diet [facility] per wife. He had full dentures in the upper but now also has full dentures in the lower jaw. He had 4 teeth pulled recently.   Previously:  I first met him on 12/26/2013 at the request of his primary care physician, at which time the patient reported lower extremity pain, weakness and numbness associated with a history of low back pain since 2012. I suggested further workup in the form of EMG nerve conduction studies to the lower extremities, blood work.  Blood work from 12/26/2013 showed a hemoglobin A1c of 7.5, methylmalonic acid was elevated at 469, ANA was negative, CRP was normal, aldolase was borderline at 10.4, CK was 451. EMG and nerve conduction testing from 01/18/2014 showed: bilateral lumbar radiculopathies, involving bilateral L4, L5, S1 myotomes. In addition, there is evidence of chronic right cervical radiculopathy, involving right C5 nerve roots. There was  also evidence of length dependent mild axonal peripheral neuropathy. I called his wife with the test results. I suggested evaluation with a spine specialist.  He was found to be intolerant to Lipitor generic and was taken off of it. He was switched to pravastatin but this was also subsequently discontinued with some subjective improvement of his symptoms. He had a mild increase in CK at 258 which subsequently returned to normal after discontinuation of his Zocor. He has  known degenerative lower back disease with an MRI showing impingement at L4-5 and L5-S1 level, he has had multiple steroid epidural injections. Because of his vascular risk factors he was started on lovastatin. His last epidural injection was in January 2015 which was not helpful. Lovastatin was discontinued in February 2015. CK level was mildly elevated at 338. His B12 supplement dose was increased recently. He had a CT head without contrast on 01/11/2013 after a fall. This showed generalized atrophy, mild chronic microvascular ischemic changes in the white matter, no acute infarct. He also had a CT cervical spine without contrast at the time which showed moderate degenerative changes, no fracture. MRI lumbar spine without contrast on 08/13/2011 showed lumbar spondylosis and degenerative disease, causing marked impingement at L4-5 and moderate impingement at L5-S1. He was started on Crestor but was initially also taking the Lovastatin 40 mg daily as well. Patient was not taking B12 injections or oral B12.  He has some tingling in the feet. He has been diabetic for about 5 years. He has no significant foot pain. He denies fasciculations and atrophy, and myoclonus. His legs gives out some and he fatigues easily. He has not had recurrent falls, but did take a fall outside at his niece's ballgame, it was raining and the terrain was hilly and slick and rocky. 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 He volunteers at the firestation.   He quit smoking in '86 and does not drink alcohol. There is no FHx muscle or nerve disease.    His Past Medical History Is Significant For: Past Medical History  Diagnosis Date  . Gout, unspecified   . Iron deficiency anemia, unspecified   . Other and unspecified hyperlipidemia   . Esophageal reflux   . Unspecified essential hypertension   . Type II or unspecified type diabetes mellitus without mention of complication, not stated as  uncontrolled   . Ulcerative colitis, unspecified   . Other B-complex deficiencies   . ST elevation myocardial infarction (STEMI) of inferior wall, subsequent episode of care [place]    a) PTCA of Cx; b) CABG   . CAD S/P percutaneous coronary angioplasty 1987    angioplasty and  STENT- Cx; MYOVIEW 1/'05: ? Ischemia vs. artifact --> sent for cath  . S/P CABG x 4 1997    a) LIMA-LAD, SVG to OM, SVG-dRCA-RPL; b) FALSE + MYOVIEW --> CATH 1/'05: 100% LAD after widelly patent D1, all Cx OM branches 100%, pRCA 100%, SVG-PDA patent w/ retrograde filling of RPL, SVG-OM widely patent ~ normal OM, LIMA-LAD patent.  [name] PAF (paroxysmal atrial fibrillation)     Last reported episode 2012; on amiodarone and anticoagulated on warfarin  . Dyslipidemia, goal LDL below 70     Doing better off of Zocor, currently on Lescol   . GERD (gastroesophageal reflux disease)   . DDD (degenerative disc disease)   . Spinal stenosis   . Barrett's esophagus 03/09/2014  . Personal history of colonic polyps  His Past Surgical History Is Significant For: Past Surgical History  Procedure Laterality Date  . Coronary artery bypass graft  11/1995    LIMA-LAD, SVG to OM, SVG-dRCA-RPL  . Cholecystectomy    . Cardiac catheterization  08/17/2003    "False positive stress test " grafts patent; RCA proximal 100% LAD 100% occlusion after normal D1 with 80% ostial as SP1; circumflex patent but OM1 on to all occluded; EF 50-55%  . Cardiac catheterization  11/1995    Preop CABG: LAD 90% at D1, circumflex 100 and OM a 90% proximal, 80% distal  . Nm myoview ltd  2005    Questionable ischemia that is likely either infarct versus artifact; normal  . Colonoscopy    . Flexible sigmoidoscopy    . Esophagogastroduodenoscopy    . Transthoracic echocardiogram  12/03/2008    LVEF =>55% normal study  . Nm myocar perf ejection fraction  07/03/2003    FALSE POSITIVE TEST; Bruce protocol, negative test with scintigraphic evidence of  inferoapical scar, diaphragmatic attenuation, moderate ischemia.  . Carotid duplex doppler  12/23/2009    Right&Left ICAs 0-49%, mildly abnormal study,     His Family History Is Significant For: Family History  Problem Relation Age of Onset  . Diabetes Mother     His Social History Is Significant For: History   Social History  . Marital Status: Married    Spouse Name: N/A  . Number of Children: 1  . Years of Education: N/A   Occupational History  . retired    Social History Main Topics  . Smoking status: Former Smoker -- 1.00 packs/day for 32 years    Types: Cigarettes    Quit date: 08/27/1984  . Smokeless tobacco: Never Used  . Alcohol Use: No  . Drug Use: No  . Sexual Activity: Not on file   Other Topics Concern  . Not on file   Social History Narrative   He is a married father of one. Does not smoke and does not drink. He walks routinely and also does stationary bike. 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 amiodarone (PACERONE) 200 MG tablet Take 1 tablet (200 mg total) by mouth daily.  [name] aspirin 81 MG tablet Take 81 mg by mouth daily.  . balsalazide (COLAZAL) 750 MG capsule TAKE 2 CAPSULES BY MOUTH TWICE DAILY  . BAYER CONTOUR TEST test strip   . CRESTOR 20 MG tablet Take 1 tablet by mouth daily.  . cyanocobalamin 2000 MCG tablet Take 2,000 mcg by mouth daily.  [name] glipiZIDE (GLUCOTROL XL) 5 MG 24 hr tablet Take 1 tablet by mouth daily.  [name] levothyroxine (SYNTHROID, LEVOTHROID) 88 MCG tablet Take 1 tablet by mouth daily.  . metFORMIN (GLUCOPHAGE) 500 MG tablet Take 1,000 mg by mouth 2 (two) times daily.  [name] oxybutynin (DITROPAN) 5 MG tablet Take 5 mg by mouth 3 (three) times daily.   . pantoprazole (PROTONIX) 40 MG  tablet Take 1 tablet by mouth daily.  [name] triamcinolone cream (KENALOG) 0.1 % Apply 1 application topically daily.  . verapamil (CALAN-SR) 120 MG CR tablet Take 1 tablet (120 mg total) by mouth daily.  . vitamin D, CHOLECALCIFEROL, 400 UNITS tablet Take 400 Units by mouth daily.  [name] warfarin (COUMADIN) 5 MG tablet Take  1 tablet by mouth daily or as directed  :  Review of Systems:  Out of a complete 14 point review of systems, all are reviewed and negative with the exception of these symptoms as listed below:   Review of Systems  Neurological: Positive for weakness.   Feels stable with balance, has lost weight. Taste has changed.   Objective:  Neurologic Exam  Physical Exam Physical Examination:   Filed Vitals:   09/27/14 1253  BP: 132/78  Pulse: 65  Temp: 97.5 F (36.4 C)  Resp: 18     General Examination: The patient is a very pleasant 74 y.o. male in no acute distress. He appears well-developed and well-nourished and well groomed. He is overweight. He is in good spirits today. He is situated in his chair.  HEENT: Normocephalic, atraumatic, pupils are equal, round and reactive to light and accommodation. Funduscopic exam is normal with sharp disc margins noted. Extraocular tracking is good without limitation to gaze excursion or nystagmus noted. Normal smooth pursuit is noted. Hearing seems mildly impaired. Face is symmetric with normal facial animation and normal facial sensation. Speech is clear with no dysarthria noted. There is no hypophonia. There is no lip, neck/head, jaw or voice tremor. Neck is supple with full range of passive and active motion. There are no carotid bruits on auscultation. Oropharynx exam reveals: mild to moderate mouth dryness, adequate dental hygiene with complete dentures in place, and moderate airway crowding, due to large tongue and narrow appearing airway. Mallampati is class III. Tongue protrudes centrally and palate elevates symmetrically.   Chest:  Clear to auscultation without wheezing, rhonchi or crackles noted.  Heart: S1+S2+0, regular and normal without murmurs, rubs or gallops noted.   Abdomen: Soft, non-tender and non-distended with normal bowel sounds appreciated on auscultation.  Extremities: There is trace pitting edema in the distal lower extremities bilaterally. Pedal pulses are intact.  Skin: Warm and dry without trophic changes noted. There are no varicose veins.  Musculoskeletal: exam reveals no obvious joint deformities, tenderness or joint swelling or erythema.   Neurologically:  Mental status: The patient is awake, alert and oriented in all 4 spheres. His immediate and remote memory, attention, language skills and fund of knowledge are appropriate. There is no evidence of aphasia, agnosia, apraxia or anomia. Speech is clear with normal prosody and enunciation. Thought process is linear. Mood is normal and affect is normal.  Cranial nerves II - XII are as described above under HEENT exam. In addition: shoulder shrug is normal with equal shoulder height noted. Motor exam: Normal bulk, strength and tone is noted in the upper extremities. He has mildly decreased bulk in the lower extremities with no fasciculation or focal atrophy noted no trophic changes were noted. He has mild hip flexor weakness right more than left and mild knee flexor weakness on the left. There is no drift, tremor or rebound, with the exception of possible slight left upper extremity rebound. Romberg is positive. Reflexes are 2+ throughout with absent ankle jerks noted, unchanged. Babinski: Toes are flexor bilaterally. Fine motor skills and coordination: intact with normal finger taps, normal hand movements, normal rapid alternating patting, normal foot taps and normal foot agility.  Cerebellar testing: No dysmetria or intention tremor on finger to nose testing. Heel to shin is unremarkable, but slightly slow bilaterally. There is no truncal ataxia.  Sensory  exam: intact to light touch, pinprick, vibration, temperature sense in the upper extremities but decrease to pinprick and vibration in the lower extremities  distally up to the ankles bilaterally and symmetrical, unchanged.  Gait, station and balance: He stands with mild difficulty. He is not able to stand narrow based and stands wide-based and slightly bowlegged. No veering to one side is noted. No leaning to one side is noted. Posture is age-appropriate. He walks slightly wide-based. He is not able to perform tandem walk. He uses a cane and appears to be more stable with a cane than without.  Assessment and Plan:   In summary, Timothy Mclean is a very pleasant 74 year old male with an underlying complex medical history of reflux disease, hypertension, chronic lung disease, vitamin D deficiency, vitamin B12 deficiency, thyroid disease, type 2 diabetes, heart disease, status post MI and PTCA in 1987, status post CABG in 1997, paroxysmal atrial fibrillation on Coumadin, gout, BPH, kidney stone, diverticulosis, shingles in 2009, who has a three-year history of progressive muscle weakness and gait disorder. He has a gait disorder, most likely secondary to peripheral neuropathy, degenerative lower spine disease, mild muscle weakness, and workup has shown mild axonal neuropathy, most likely diabetic in etiology, and CK level in June was elevated in the 400s. He was on Crestor and there was some confusion with his medications in the past. He is now maintained on Crestor, CK levels have improved. Today we reviewed his recent office visit with cardiology as well. This was this month. He sees Dr. Ellyn Hack. I also reviewed blood work from December. We talked about his appetite today. I encouraged him to monitor his weight. Appetite and weight loss can be due to several reasons, one of which could be medication causing taste changes, he also has recent new dentures which need getting used to. He is also advised to reduce  his soda intake and increase his water intake. He is advised to continue using his cane but I think he would benefit from a round of physical therapy outpatient. He is agreeable. To that end, I would like to refer him to neuro rehabilitation next aura. He is also advised to consider protein milkshakes as a supplement, something that would be agreeable with his diabetes though. He is advised to continue to stay active mentally and physically. As far as his memory loss, we will continue to monitor. He is able to give a reasonably good history. Memory loss can be secondary to dehydration, medication side effects, aging, and of course also secondary to dementia. He is certainly at risk for vascular dementia but at this point we will just monitor his symptoms. Again, hydration, staying physically and mentally active will help. Good nutrition will help. At this juncture, I would like to see him back in 4 months, sooner if the need arises. I answered all her questions today and the patient and his wife were in agreement. Most of my 25 minute visit today was spent in counseling, coordination of care, reviewing of test results from his primary care physician and cardiology note, as well as our previous workup  And discussion of gait disorder, memory loss, fall risk, and weight loss. as I understand also on lovastatin at the time which since then has been discontinued. In the past he had to

## 2014-10-05 ENCOUNTER — Ambulatory Visit (INDEPENDENT_AMBULATORY_CARE_PROVIDER_SITE_OTHER): Payer: Medicare Other | Admitting: Pharmacist Clinician (PhC)/ Clinical Pharmacy Specialist

## 2014-10-05 DIAGNOSIS — Z7901 Long term (current) use of anticoagulants: Secondary | ICD-10-CM

## 2014-10-05 DIAGNOSIS — I4891 Unspecified atrial fibrillation: Secondary | ICD-10-CM

## 2014-10-05 LAB — POCT INR: INR: 2.3

## 2014-10-11 ENCOUNTER — Telehealth: Payer: Self-pay

## 2014-10-11 NOTE — Telephone Encounter (Signed)
I received a paper message that [facility] left a vm for Dr. Rexene Alberts. No reason was given for call. I left a vm stating that we had a vm from him and if he needs anything to call us back.

## 2014-10-18 ENCOUNTER — Encounter: Payer: Self-pay | Admitting: Physical Therapy

## 2014-10-18 ENCOUNTER — Ambulatory Visit: Payer: Medicare Other | Attending: Neurology | Admitting: Physical Therapy

## 2014-10-18 DIAGNOSIS — R270 Ataxia, unspecified: Secondary | ICD-10-CM

## 2014-10-18 DIAGNOSIS — R269 Unspecified abnormalities of gait and mobility: Secondary | ICD-10-CM | POA: Insufficient documentation

## 2014-10-18 NOTE — Therapy (Signed)
[facility] [address] [place], [place], [address] Phone: [phone]   Fax:  [phone]  Physical Therapy Evaluation  Patient Details  Name: Timothy Mclean MRN: [id number] Date of Birth: 1941/01/20 Referring Provider:  Star Age, MD  Encounter Date: 10/18/2014      PT End of Session - 10/18/14 1114    Visit Number 1   Number of Visits 9   Date for PT Re-Evaluation 11/23/14   Authorization Type medicare g-code needed   PT Start Time 0258   PT Stop Time 1100   PT Time Calculation (min) 45 min   Equipment Utilized During Treatment Other (comment)   Activity Tolerance Patient tolerated treatment well      Past Medical History  Diagnosis Date  . Gout, unspecified   . Iron deficiency anemia, unspecified   . Other and unspecified hyperlipidemia   . Esophageal reflux   . Unspecified essential hypertension   . Type II or unspecified type diabetes mellitus without mention of complication, not stated as uncontrolled   . Ulcerative colitis, unspecified   . Other B-complex deficiencies   . ST elevation myocardial infarction (STEMI) of inferior wall, subsequent episode of care [place]    a) PTCA of Cx; b) CABG   . CAD S/P percutaneous coronary angioplasty 1987    angioplasty and  STENT- Cx; MYOVIEW 1/'05: ? Ischemia vs. artifact --> sent for cath  . S/P CABG x 4 1997    a) LIMA-LAD, SVG to OM, SVG-dRCA-RPL; b) FALSE + MYOVIEW --> CATH 1/'05: 100% LAD after widelly patent D1, all Cx OM branches 100%, pRCA 100%, SVG-PDA patent w/ retrograde filling of RPL, SVG-OM widely patent ~ normal OM, LIMA-LAD patent.  [name] PAF (paroxysmal atrial fibrillation)     Last reported episode 2012; on amiodarone and anticoagulated on warfarin  . Dyslipidemia, goal LDL below 70     Doing better off of Zocor, currently on Lescol   . GERD (gastroesophageal reflux disease)   . DDD (degenerative disc disease)   . Spinal stenosis   .  Barrett's esophagus 03/09/2014  . Personal history of colonic polyps     Past Surgical History  Procedure Laterality Date  . Coronary artery bypass graft  11/1995    LIMA-LAD, SVG to OM, SVG-dRCA-RPL  . Cholecystectomy    . Cardiac catheterization  08/17/2003    "False positive stress test " grafts patent; RCA proximal 100% LAD 100% occlusion after normal D1 with 80% ostial as SP1; circumflex patent but OM1 on to all occluded; EF 50-55%  . Cardiac catheterization  11/1995    Preop CABG: LAD 90% at D1, circumflex 100 and OM a 90% proximal, 80% distal  . Nm myoview ltd  2005    Questionable ischemia that is likely either infarct versus artifact; normal  . Colonoscopy    . Flexible sigmoidoscopy    . Esophagogastroduodenoscopy    . Transthoracic echocardiogram  12/03/2008    LVEF =>55% normal study  . Nm myocar perf ejection fraction  07/03/2003    FALSE POSITIVE TEST; Bruce protocol, negative test with scintigraphic evidence of inferoapical scar, diaphragmatic attenuation, moderate ischemia.  . Carotid duplex doppler  12/23/2009    Right&Left ICAs 0-49%, mildly abnormal study,     There were no vitals filed for this visit.  Visit Diagnosis:  Abnormality of gait  Ataxia      Subjective Assessment - 10/18/14 1019    Symptoms Main complaint is my legs; I  walk in home w/o cane but i grab the furniture and walls; out I always use the cane; I have fallen a few times; most recent about a year ago; He does not know why he's unbalanced;  current exericse is bike riding and walking in yard   How long can you sit comfortably? no problems   How long can you stand comfortably? can't stand too long; limited by fatigue; denies pain   How long can you walk comfortably? feels he can walk 1/2 mile maybe a mile   Patient Stated Goals want better balance and leg strength   Currently in Pain? No/denies            [facility] PT Assessment - 10/18/14 0001    Balance Screen   Has the patient fallen in  the past 6 months No   Is the patient reluctant to leave their home because of a fear of falling?  No   Sensation   Light Touch Appears Intact   Proprioception Appears Intact   Functional Tests   Functional tests Single leg stance;Sit to Stand   Single Leg Stance   Comments unalbe to hold >2seconds able to lift w/o LOB   Sit to Stand   Comments 15 second 5 x sit to stand minimal UE /wide base/no adverse s/s   ROM / Strength   AROM / PROM / Strength Strength   Strength   Overall Strength Deficits   hip flex at 4/5 otherwise LE 5/5   Ambulation/Gait   Ambulation/Gait Yes   Ambulation/Gait Assistance 5: Supervision   Ambulation/Gait Assistance Details --  4 laps(460')  in gym in 3:05  2.48 ft/sec   Ambulation Distance (Feet) 460 Feet   Gait Pattern Decreased trunk rotation;Wide base of support;Trunk flexed   Gait velocity 2.48 ft/sec   Stairs Yes   Stairs Assistance 7: Independent   Standardized Balance Assessment   Standardized Balance Assessment Berg Balance Test   Berg Balance Test   Sit to Stand Able to stand without using hands and stabilize independently   Standing Unsupported Able to stand safely 2 minutes   Sitting with Back Unsupported but Feet Supported on Floor or Stool Able to sit safely and securely 2 minutes   Stand to Sit Sits safely with minimal use of hands   Transfers Able to transfer safely, minor use of hands   Standing Unsupported with Eyes Closed Able to stand 10 seconds safely   Standing Ubsupported with Feet Together Able to place feet together independently and stand 1 minute safely   From Standing, Reach Forward with Outstretched Arm Can reach forward >12 cm safely (5")   From Standing Position, Pick up Object from Floor Able to pick up shoe safely and easily   From Standing Position, Turn to Look Behind Over each Shoulder Turn sideways only but maintains balance   Turn 360 Degrees Able to turn 360 degrees safely but slowly   Standing Unsupported,  Alternately Place Feet on Step/Stool Able to stand independently and complete 8 steps >20 seconds   Standing Unsupported, One Foot in [employer] balance while stepping or standing   Standing on One Leg Tries to lift leg/unable to hold 3 seconds but remains standing independently   Total Score 43                           PT Education - 10/18/14 1112    Education provided Yes   Education Details  initial HEP set up (berg components and LE stretching); encouraged head/eye dissassoication with walking   Person(s) Educated Patient   Methods Demonstration   Comprehension Returned demonstration             PT Long Term Goals - 2014/10/30 1129    PT LONG TERM GOAL #1   Title Pt will demonstrate a lowered fall risk evident by scoring >50/56 on BERG balance test   Baseline 43/56   Time 4   Period Weeks   Status New   PT LONG TERM GOAL #2   Title Pt will demonstrate compentency with comprehensive HEP to address balance reaction and flexibility deficits   Time 4   Period Weeks   Status New   PT LONG TERM GOAL #3   Title Patient will demonstrate an improved gait pattern evident by narrowed base of support and disassociation of head/eye/body mvt while walking using LRAD   Time 4   Period Weeks   Status New        There ex;  Standing calf stretch (on bottom step)  2 x 30 seconds; bilateral; hamstring stretch 2 x 30 sec; bilateral;  Stair training pre and post stretches using 4 steps and hand rail; step to prior to stretch able to do step through post stretching       Plan - 2014/10/30 1117    Clinical Impression Statement Mr Malter is a well developed and pleasant 74 y/o male; with  hx of DMii; CABG, DDD/spinal stenosis and dx of ataxia; he is unclear of why he has a balance/gait deficit; denies vertigo or hx of inner ear pathology; His LE strenght is very good; he does have some flexibility limitations; intact to light touch in feet; hip;ankle and step stratagies  are present but diminished; with 460' walk no s/s of dypnea no report of pain wiht any of the activites today;  he does have difficulty disassociating his head and eye mvt from body when walking;  narrow base of support and single leg support activities are severely limited;  He will benefit from  skilled PT to address the gait deficits and lower his fall risk    Pt will benefit from skilled therapeutic intervention in order to improve on the following deficits Abnormal gait;Decreased balance;Decreased range of motion;Impaired flexibility   Rehab Potential Good   PT Frequency 2x / week   PT Duration 4 weeks   PT Treatment/Interventions Therapeutic activities;Therapeutic exercise;Balance training;Gait training;Stair training;Neuromuscular re-education   PT Next Visit Plan Review HEP; advance single limb support activies and head/eye/body disassociation ex's   Consulted and Agree with Plan of Care Patient          G-Codes - 10-30-14 1133    Functional Assessment Tool Used BERG   Functional Limitation Mobility: Walking and moving around   Mobility: Walking and Moving Around Current Status [phone]) At least 40 percent but less than 60 percent impaired, limited or restricted   Mobility: Walking and Moving Around Goal Status [phone]) At least 1 percent but less than 20 percent impaired, limited or restricted       Problem List Patient Active Problem List   Diagnosis Date Noted  . Barrett's esophagus 03/09/2014  . Hypotension due to drugs 03/06/2014  . Emphysema of lung 02/19/2014  . Cough 02/19/2014  . H/O amiodarone therapy 02/16/2013  . S/P CABG x 4   . Persistent atrial fibrillation   . Dyslipidemia, goal LDL below 70   . Personal history of colonic polyps  09/10/2011  . UC (ulcerative colitis) 09/10/2011  . Anticoagulant long-term use 09/10/2011  . VITAMIN B12 DEFICIENCY 01/30/2009  . Type 2 diabetes mellitus with complication - CAD 78/47/8412  . GOUT 10/31/2007  . Essential  hypertension 10/31/2007  . ACID REFLUX DISEASE 10/31/2007  . CAD S/P PTCA-PCI then CABG x4 12/01/1995  . ST elevation myocardial infarction (STEMI) of inferior wall, subsequent episode of care 10/30/1985    Jana Hakim PT DPT 10/18/2014, 11:37 AM  [facility] [address] [place], [place], [address] Phone: [phone]   Fax:  [phone]

## 2014-10-18 NOTE — Patient Instructions (Signed)
Gave pt a copy of the BERG with today's scores; instructed him to work on the narrow base of support standing and single leg stance; and do calf and hamstring stretches 3x30 seconds each side 3 x day for the next 2 weeks

## 2014-10-19 NOTE — Therapy (Signed)
[facility] [address] [place], [place], [address] Phone: [phone]   Fax:  [phone]  Physical Therapy Evaluation  Patient Details  Name: Timothy Mclean MRN: [id number] Date of Birth: [birth year] Referring Provider:  Star Age, MD  Encounter Date: 10/18/2014      PT End of Session - 10/18/14 1114    Visit Number 1   Number of Visits 9   Date for PT Re-Evaluation 11/23/14   Authorization Type medicare g-code needed   PT Start Time 5456   PT Stop Time 1100   PT Time Calculation (min) 45 min   Equipment Utilized During Treatment Other (comment)   Activity Tolerance Patient tolerated treatment well      Past Medical History  Diagnosis Date  . Gout, unspecified   . Iron deficiency anemia, unspecified   . Other and unspecified hyperlipidemia   . Esophageal reflux   . Unspecified essential hypertension   . Type II or unspecified type diabetes mellitus without mention of complication, not stated as uncontrolled   . Ulcerative colitis, unspecified   . Other B-complex deficiencies   . ST elevation myocardial infarction (STEMI) of inferior wall, subsequent episode of care [place]    a) PTCA of Cx; b) CABG   . CAD S/P percutaneous coronary angioplasty 1987    angioplasty and  STENT- Cx; MYOVIEW 1/'05: ? Ischemia vs. artifact --> sent for cath  . S/P CABG x 4 1997    a) LIMA-LAD, SVG to OM, SVG-dRCA-RPL; b) FALSE + MYOVIEW --> CATH 1/'05: 100% LAD after widelly patent D1, all Cx OM branches 100%, pRCA 100%, SVG-PDA patent w/ retrograde filling of RPL, SVG-OM widely patent ~ normal OM, LIMA-LAD patent.  [name] PAF (paroxysmal atrial fibrillation)     Last reported episode 2012; on amiodarone and anticoagulated on warfarin  . Dyslipidemia, goal LDL below 70     Doing better off of Zocor, currently on Lescol   . GERD (gastroesophageal reflux disease)   . DDD (degenerative disc disease)   . Spinal stenosis   .  Barrett's esophagus 03/09/2014  . Personal history of colonic polyps     Past Surgical History  Procedure Laterality Date  . Coronary artery bypass graft  11/1995    LIMA-LAD, SVG to OM, SVG-dRCA-RPL  . Cholecystectomy    . Cardiac catheterization  08/17/2003    "False positive stress test " grafts patent; RCA proximal 100% LAD 100% occlusion after normal D1 with 80% ostial as SP1; circumflex patent but OM1 on to all occluded; EF 50-55%  . Cardiac catheterization  11/1995    Preop CABG: LAD 90% at D1, circumflex 100 and OM a 90% proximal, 80% distal  . Nm myoview ltd  2005    Questionable ischemia that is likely either infarct versus artifact; normal  . Colonoscopy    . Flexible sigmoidoscopy    . Esophagogastroduodenoscopy    . Transthoracic echocardiogram  12/03/2008    LVEF =>55% normal study  . Nm myocar perf ejection fraction  07/03/2003    FALSE POSITIVE TEST; Bruce protocol, negative test with scintigraphic evidence of inferoapical scar, diaphragmatic attenuation, moderate ischemia.  . Carotid duplex doppler  12/23/2009    Right&Left ICAs 0-49%, mildly abnormal study,     There were no vitals filed for this visit.  Visit Diagnosis:  Abnormality of gait - Plan: PT plan of care cert/re-cert  Ataxia - Plan: PT plan of care cert/re-cert      Subjective  Assessment - 10/18/14 1019    Symptoms Main complaint is my legs; I walk in home w/o cane but i grab the furniture and walls; out I always use the cane; I have fallen a few times; most recent about a year ago; He does not know why he's unbalanced;  current exericse is bike riding and walking in yard   How long can you sit comfortably? no problems   How long can you stand comfortably? can't stand too long; limited by fatigue; denies pain   How long can you walk comfortably? feels he can walk 1/2 mile maybe a mile   Patient Stated Goals want better balance and leg strength   Currently in Pain? No/denies            [facility] PT  Assessment - 10/19/14 0001    Assessment   Medical Diagnosis gait disorder; sensory ataxia, risk for falls   Onset Date 09/27/14   Prior Therapy none   Precautions   Precautions Fall   Restrictions   Weight Bearing Restrictions No   Balance Screen   Has the patient fallen in the past 6 months Yes   How many times? 3   Has the patient had a decrease in activity level because of a fear of falling?  Yes   Is the patient reluctant to leave their home because of a fear of falling?  No                           PT Education - 10/18/14 1112    Education provided Yes   Education Details initial HEP set up (berg components and LE stretching); encouraged head/eye dissassoication with walking   Person(s) Educated Patient   Methods Demonstration   Comprehension Returned demonstration          PT Short Term Goals - 10/19/14 1534    PT SHORT TERM GOAL #1   Title see LTG's  only 4 week POC           PT Long Term Goals - 10/18/14 1129    PT LONG TERM GOAL #1   Title Pt will demonstrate a lowered fall risk evident by scoring >50/56 on BERG balance test   Baseline 43/56   Time 4   Period Weeks   Status New   PT LONG TERM GOAL #2   Title Pt will demonstrate compentency with comprehensive HEP to address balance reaction and flexibility deficits   Time 4   Period Weeks   Status New   PT LONG TERM GOAL #3   Title Patient will demonstrate an improved gait pattern evident by narrowed base of support and disassociation of head/eye/body mvt while walking using LRAD   Time 4   Period Weeks   Status New               Plan - 10/18/14 1117    Clinical Impression Statement Timothy Mclean is a well developed and pleasant 74 y/o male; with  hx of DMii; CABG, DDD/spinal stenosis and dx of ataxia; he is unclear of why he has a balance/gait deficit; denies vertigo or hx of inner ear pathology; His LE strenght is very good; he does have some flexibility limitations; intact  to light touch in feet; hip;ankle and step stratagies are present but diminished; with 460' walk no s/s of dypnea no report of pain wiht any of the activites today;  he does have difficulty disassociating his head and  eye mvt from body when walking;  narrow base of support and single leg support activities are severely limited;  He will benefit from  skilled PT to address the gait deficits and lower his fall risk    Pt will benefit from skilled therapeutic intervention in order to improve on the following deficits Abnormal gait;Decreased balance;Decreased range of motion;Impaired flexibility   Rehab Potential Good   PT Frequency 2x / week   PT Duration 4 weeks   PT Treatment/Interventions Therapeutic activities;Therapeutic exercise;Balance training;Gait training;Stair training;Neuromuscular re-education   PT Next Visit Plan Review HEP; advance single limb support activies and head/eye/body disassociation ex's   Consulted and Agree with Plan of Care Patient          G-Codes - 17-Nov-2014 1133    Functional Assessment Tool Used BERG   Functional Limitation Mobility: Walking and moving around   Mobility: Walking and Moving Around Current Status [phone]) At least 40 percent but less than 60 percent impaired, limited or restricted   Mobility: Walking and Moving Around Goal Status [phone]) At least 1 percent but less than 20 percent impaired, limited or restricted       Problem List Patient Active Problem List   Diagnosis Date Noted  . Barrett's esophagus 03/09/2014  . Hypotension due to drugs 03/06/2014  . Emphysema of lung 02/19/2014  . Cough 02/19/2014  . H/O amiodarone therapy 02/16/2013  . S/P CABG x 4   . Persistent atrial fibrillation   . Dyslipidemia, goal LDL below 70   . Personal history of colonic polyps 09/10/2011  . UC (ulcerative colitis) 09/10/2011  . Anticoagulant long-term use 09/10/2011  . VITAMIN B12 DEFICIENCY 01/30/2009  . Type 2 diabetes mellitus with complication -  CAD 78/97/8478  . GOUT 10/31/2007  . Essential hypertension 10/31/2007  . ACID REFLUX DISEASE 10/31/2007  . CAD S/P PTCA-PCI then CABG x4 12/01/1995  . ST elevation myocardial infarction (STEMI) of inferior wall, subsequent episode of care 10/30/1985    Jana Hakim PT DPT 10/19/2014, 3:43 PM  [facility] [address] [place], [place], [address] Phone: [phone]   Fax:  [phone]

## 2014-10-19 NOTE — Therapy (Addendum)
[facility] [address] [place], [place], [address] Phone: [phone]   Fax:  [phone]  Physical Therapy Evaluation  Patient Details  Name: Timothy Mclean MRN: [id number] Date of Birth: [birth year] Referring Provider:  Star Age, MD  Encounter Date: 10/18/2014      PT End of Session - 10/18/14 1114    Visit Number 1   Number of Visits 9   Date for PT Re-Evaluation 11/23/14   Authorization Type medicare g-code needed   PT Start Time 7322   PT Stop Time 1100   PT Time Calculation (min) 45 min   Equipment Utilized During Treatment Other (comment)   Activity Tolerance Patient tolerated treatment well      Past Medical History  Diagnosis Date  . Gout, unspecified   . Iron deficiency anemia, unspecified   . Other and unspecified hyperlipidemia   . Esophageal reflux   . Unspecified essential hypertension   . Type II or unspecified type diabetes mellitus without mention of complication, not stated as uncontrolled   . Ulcerative colitis, unspecified   . Other B-complex deficiencies   . ST elevation myocardial infarction (STEMI) of inferior wall, subsequent episode of care [place]    a) PTCA of Cx; b) CABG   . CAD S/P percutaneous coronary angioplasty 1987    angioplasty and  STENT- Cx; MYOVIEW 1/'05: ? Ischemia vs. artifact --> sent for cath  . S/P CABG x 4 1997    a) LIMA-LAD, SVG to OM, SVG-dRCA-RPL; b) FALSE + MYOVIEW --> CATH 1/'05: 100% LAD after widelly patent D1, all Cx OM branches 100%, pRCA 100%, SVG-PDA patent w/ retrograde filling of RPL, SVG-OM widely patent ~ normal OM, LIMA-LAD patent.  [name] PAF (paroxysmal atrial fibrillation)     Last reported episode 2012; on amiodarone and anticoagulated on warfarin  . Dyslipidemia, goal LDL below 70     Doing better off of Zocor, currently on Lescol   . GERD (gastroesophageal reflux disease)   . DDD (degenerative disc disease)   . Spinal stenosis   .  Barrett's esophagus 03/09/2014  . Personal history of colonic polyps     Past Surgical History  Procedure Laterality Date  . Coronary artery bypass graft  11/1995    LIMA-LAD, SVG to OM, SVG-dRCA-RPL  . Cholecystectomy    . Cardiac catheterization  08/17/2003    "False positive stress test " grafts patent; RCA proximal 100% LAD 100% occlusion after normal D1 with 80% ostial as SP1; circumflex patent but OM1 on to all occluded; EF 50-55%  . Cardiac catheterization  11/1995    Preop CABG: LAD 90% at D1, circumflex 100 and OM a 90% proximal, 80% distal  . Nm myoview ltd  2005    Questionable ischemia that is likely either infarct versus artifact; normal  . Colonoscopy    . Flexible sigmoidoscopy    . Esophagogastroduodenoscopy    . Transthoracic echocardiogram  12/03/2008    LVEF =>55% normal study  . Nm myocar perf ejection fraction  07/03/2003    FALSE POSITIVE TEST; Bruce protocol, negative test with scintigraphic evidence of inferoapical scar, diaphragmatic attenuation, moderate ischemia.  . Carotid duplex doppler  12/23/2009    Right&Left ICAs 0-49%, mildly abnormal study,     There were no vitals filed for this visit.  Visit Diagnosis:  Abnormality of gait - Plan: PT plan of care cert/re-cert  Ataxia - Plan: PT plan of care cert/re-cert      Subjective  Assessment - 10/18/14 1019    Symptoms Main complaint is my legs; I walk in home w/o cane but i grab the furniture and walls; out I always use the cane; I have fallen a few times; most recent about a year ago; He does not know why he's unbalanced;  current exericse is bike riding and walking in yard   How long can you sit comfortably? no problems   How long can you stand comfortably? can't stand too long; limited by fatigue; denies pain   How long can you walk comfortably? feels he can walk 1/2 mile maybe a mile   Patient Stated Goals want better balance and leg strength   Currently in Pain? No/denies            [facility] PT  Assessment - 10/19/14 1546    Sensation   Light Touch Appears Intact   Proprioception Appears Intact   Functional Tests   Functional tests Single leg stance;Sit to Stand   Single Leg Stance   Comments unalbe to hold >2seconds able to lift w/o LOB   Sit to Stand   Comments 15 second 5 x sit to stand minimal UE /wide base/no adverse s/s   Strength   Overall Strength Deficits   hip flex at 4/5 otherwise LE 5/5   Ambulation/Gait   Ambulation/Gait Yes   Ambulation/Gait Assistance 5: Supervision   Ambulation Distance (Feet) 460 Feet   Gait Pattern Decreased trunk rotation;Wide base of support;Trunk flexed   Gait velocity 2.48 ft/sec   Stairs Yes   Stairs Assistance 7: Independent   Standardized Balance Assessment   Standardized Balance Assessment Berg Balance Test   Berg Balance Test   Sit to Stand Able to stand without using hands and stabilize independently   Standing Unsupported Able to stand safely 2 minutes   Sitting with Back Unsupported but Feet Supported on Floor or Stool Able to sit safely and securely 2 minutes   Stand to Sit Sits safely with minimal use of hands   Transfers Able to transfer safely, minor use of hands   Standing Unsupported with Eyes Closed Able to stand 10 seconds safely   Standing Ubsupported with Feet Together Able to place feet together independently and stand 1 minute safely   From Standing, Reach Forward with Outstretched Arm Can reach forward >12 cm safely (5")   From Standing Position, Pick up Object from Floor Able to pick up shoe safely and easily   From Standing Position, Turn to Look Behind Over each Shoulder Turn sideways only but maintains balance   Turn 360 Degrees Able to turn 360 degrees safely but slowly   Standing Unsupported, Alternately Place Feet on Step/Stool Able to stand independently and complete 8 steps >20 seconds   Standing Unsupported, One Foot in [employer] balance while stepping or standing   Standing on One Leg Tries to lift  leg/unable to hold 3 seconds but remains standing independently   Total Score 43        10/19/14 0001  Assessment  Medical Diagnosis gait disorder; sensory ataxia, risk for falls  Onset Date 09/27/14  Prior Therapy none  Precautions  Precautions Fall  Restrictions  Weight Bearing Restrictions No  Balance Screen  Has the patient fallen in the past 6 months Yes  How many times? 3  Has the patient had a decrease in activity level because of a fear of falling?  Yes  Is the patient reluctant to leave their home because of a fear  of falling?  No                        PT Education - 11-07-2014 1112    Education provided Yes   Education Details initial HEP set up (berg components and LE stretching); encouraged head/eye dissassoication with walking   Person(s) Educated Patient   Methods Demonstration   Comprehension Returned demonstration          PT Short Term Goals - 10/19/14 1534    PT SHORT TERM GOAL #1   Title see LTG's  only 4 week POC           PT Long Term Goals - 11-07-14 1129    PT LONG TERM GOAL #1   Title Pt will demonstrate a lowered fall risk evident by scoring >50/56 on BERG balance test   Baseline 43/56   Time 4   Period Weeks   Status New   PT LONG TERM GOAL #2   Title Pt will demonstrate compentency with comprehensive HEP to address balance reaction and flexibility deficits   Time 4   Period Weeks   Status New   PT LONG TERM GOAL #3   Title Patient will demonstrate an improved gait pattern evident by narrowed base of support and disassociation of head/eye/body mvt while walking using LRAD   Time 4   Period Weeks   Status New               Plan - 07-Nov-2014 1117    Clinical Impression Statement Mr Daubert is a well developed and pleasant 74 y/o male; with  hx of DMii; CABG, DDD/spinal stenosis and dx of ataxia; he is unclear of why he has a balance/gait deficit; denies vertigo or hx of inner ear pathology; His LE strenght is  very good; he does have some flexibility limitations; intact to light touch in feet; hip;ankle and step stratagies are present but diminished; with 460' walk no s/s of dypnea no report of pain wiht any of the activites today;  he does have difficulty disassociating his head and eye mvt from body when walking;  narrow base of support and single leg support activities are severely limited;  He will benefit from  skilled PT to address the gait deficits and lower his fall risk    Pt will benefit from skilled therapeutic intervention in order to improve on the following deficits Abnormal gait;Decreased balance;Decreased range of motion;Impaired flexibility   Rehab Potential Good   PT Frequency 2x / week   PT Duration 4 weeks   PT Treatment/Interventions Therapeutic activities;Therapeutic exercise;Balance training;Gait training;Stair training;Neuromuscular re-education   PT Next Visit Plan Review HEP; advance single limb support activies and head/eye/body disassociation ex's   Consulted and Agree with Plan of Care Patient          G-Codes - 11/07/14 1133    Functional Assessment Tool Used BERG   Functional Limitation Mobility: Walking and moving around   Mobility: Walking and Moving Around Current Status [phone]) At least 40 percent but less than 60 percent impaired, limited or restricted   Mobility: Walking and Moving Around Goal Status [phone]) At least 1 percent but less than 20 percent impaired, limited or restricted       Problem List Patient Active Problem List   Diagnosis Date Noted  . Barrett's esophagus 03/09/2014  . Hypotension due to drugs 03/06/2014  . Emphysema of lung 02/19/2014  . Cough 02/19/2014  . H/O amiodarone therapy 02/16/2013  .  S/P CABG x 4   . Persistent atrial fibrillation   . Dyslipidemia, goal LDL below 70   . Personal history of colonic polyps 09/10/2011  . UC (ulcerative colitis) 09/10/2011  . Anticoagulant long-term use 09/10/2011  . VITAMIN B12 DEFICIENCY  01/30/2009  . Type 2 diabetes mellitus with complication - CAD 19/62/2297  . GOUT 10/31/2007  . Essential hypertension 10/31/2007  . ACID REFLUX DISEASE 10/31/2007  . CAD S/P PTCA-PCI then CABG x4 12/01/1995  . ST elevation myocardial infarction (STEMI) of inferior wall, subsequent episode of care 10/30/1985    Jana Hakim PT DPT 10/19/2014, 3:47 PM  [facility] [address] [place], [place], [address] Phone: [phone]   Fax:  [phone]

## 2014-10-23 ENCOUNTER — Ambulatory Visit: Payer: Medicare Other | Attending: Neurology

## 2014-10-23 DIAGNOSIS — R269 Unspecified abnormalities of gait and mobility: Secondary | ICD-10-CM | POA: Diagnosis present

## 2014-10-23 DIAGNOSIS — R270 Ataxia, unspecified: Secondary | ICD-10-CM | POA: Insufficient documentation

## 2014-10-23 NOTE — Therapy (Signed)
[facility] [address] [place], [place], [address] Phone: [phone]   Fax:  [phone]  Physical Therapy Treatment  Patient Details  Name: Timothy Mclean MRN: [id number] Date of Birth: 11-22-1940 Referring Provider:  Derinda Late, MD  Encounter Date: 10/23/2014      PT End of Session - 10/23/14 1328    Visit Number 2   Number of Visits 9   Date for PT Re-Evaluation 11/23/14   Authorization Type medicare g-code needed   PT Start Time 1105   PT Stop Time 1144   PT Time Calculation (min) 39 min   Equipment Utilized During Treatment Gait belt   Activity Tolerance Patient tolerated treatment well   Behavior During Therapy [facility] for tasks assessed/performed      Past Medical History  Diagnosis Date  . Gout, unspecified   . Iron deficiency anemia, unspecified   . Other and unspecified hyperlipidemia   . Esophageal reflux   . Unspecified essential hypertension   . Type II or unspecified type diabetes mellitus without mention of complication, not stated as uncontrolled   . Ulcerative colitis, unspecified   . Other B-complex deficiencies   . ST elevation myocardial infarction (STEMI) of inferior wall, subsequent episode of care [place]    a) PTCA of Cx; b) CABG   . CAD S/P percutaneous coronary angioplasty 1987    angioplasty and  STENT- Cx; MYOVIEW 1/'05: ? Ischemia vs. artifact --> sent for cath  . S/P CABG x 4 1997    a) LIMA-LAD, SVG to OM, SVG-dRCA-RPL; b) FALSE + MYOVIEW --> CATH 1/'05: 100% LAD after widelly patent D1, all Cx OM branches 100%, pRCA 100%, SVG-PDA patent w/ retrograde filling of RPL, SVG-OM widely patent ~ normal OM, LIMA-LAD patent.  [name] PAF (paroxysmal atrial fibrillation)     Last reported episode 2012; on amiodarone and anticoagulated on warfarin  . Dyslipidemia, goal LDL below 70     Doing better off of Zocor, currently on Lescol   . GERD (gastroesophageal reflux disease)   . DDD  (degenerative disc disease)   . Spinal stenosis   . Barrett's esophagus 03/09/2014  . Personal history of colonic polyps     Past Surgical History  Procedure Laterality Date  . Coronary artery bypass graft  11/1995    LIMA-LAD, SVG to OM, SVG-dRCA-RPL  . Cholecystectomy    . Cardiac catheterization  08/17/2003    "False positive stress test " grafts patent; RCA proximal 100% LAD 100% occlusion after normal D1 with 80% ostial as SP1; circumflex patent but OM1 on to all occluded; EF 50-55%  . Cardiac catheterization  11/1995    Preop CABG: LAD 90% at D1, circumflex 100 and OM a 90% proximal, 80% distal  . Nm myoview ltd  2005    Questionable ischemia that is likely either infarct versus artifact; normal  . Colonoscopy    . Flexible sigmoidoscopy    . Esophagogastroduodenoscopy    . Transthoracic echocardiogram  12/03/2008    LVEF =>55% normal study  . Nm myocar perf ejection fraction  07/03/2003    FALSE POSITIVE TEST; Bruce protocol, negative test with scintigraphic evidence of inferoapical scar, diaphragmatic attenuation, moderate ischemia.  . Carotid duplex doppler  12/23/2009    Right&Left ICAs 0-49%, mildly abnormal study,     There were no vitals filed for this visit.  Visit Diagnosis:  Abnormality of gait  Ataxia      Subjective Assessment - 10/23/14 1107  Subjective Pt denied falls or changes since last visit.        Therex: -Standing B calf stretch 3x30 second hold. VC's and demonstration for technique. -Seated B hamstring stretch 3x30 second hold. VC's and demonstration for technique. -Sit<>stand x10 without UE assist. VC's for technique.  Neuro re-ed: -Performed in corner with chair in front of pt for safety; B LEs with 10-30 second holds (2-3 sets) on compliant and non-compliant surfaces: 0-1 UE support. Feet apart/together with and without head turns, modified tandem stance, and single leg stance with UE support. Cues for  technique.                        PT Education - 10/23/14 1328    Education provided Yes   Education Details Stretching and balance HEP   Person(s) Educated Patient   Methods Explanation;Demonstration;Verbal cues;Handout   Comprehension Verbalized understanding;Returned demonstration          PT Short Term Goals - 10/19/14 1534    PT SHORT TERM GOAL #1   Title see LTG's  only 4 week POC           PT Long Term Goals - 10/23/14 1331    PT LONG TERM GOAL #1   Title Pt will demonstrate a lowered fall risk evident by scoring >50/56 on BERG balance test   Baseline 43/56   Time 4   Period Weeks   Status On-going   PT LONG TERM GOAL #2   Title Pt will demonstrate compentency with comprehensive HEP to address balance reaction and flexibility deficits   Time 4   Period Weeks   Status On-going   PT LONG TERM GOAL #3   Title Patient will demonstrate an improved gait pattern evident by narrowed base of support and disassociation of head/eye/body mvt while walking using LRAD   Time 4   Period Weeks   Status On-going               Plan - 10/23/14 1328    Clinical Impression Statement Pt demonstrated progress towards goals, as he tolerated stretch/balance HEP well. Pt experienced increased postural sway during narrow BOS activities and while standing on compliant surfaces. Continue with POC.   Pt will benefit from skilled therapeutic intervention in order to improve on the following deficits Abnormal gait;Decreased balance;Decreased range of motion;Impaired flexibility   Rehab Potential Good   PT Frequency 2x / week   PT Duration 4 weeks   PT Treatment/Interventions Therapeutic activities;Therapeutic exercise;Balance training;Gait training;Stair training;Neuromuscular re-education   PT Next Visit Plan Dynamic gait and balance training   Consulted and Agree with Plan of Care Patient        Problem List Patient Active Problem List   Diagnosis Date  Noted  . Barrett's esophagus 03/09/2014  . Hypotension due to drugs 03/06/2014  . Emphysema of lung 02/19/2014  . Cough 02/19/2014  . H/O amiodarone therapy 02/16/2013  . S/P CABG x 4   . Persistent atrial fibrillation   . Dyslipidemia, goal LDL below 70   . Personal history of colonic polyps 09/10/2011  . UC (ulcerative colitis) 09/10/2011  . Anticoagulant long-term use 09/10/2011  . VITAMIN B12 DEFICIENCY 01/30/2009  . Type 2 diabetes mellitus with complication - CAD 92/42/6834  . GOUT 10/31/2007  . Essential hypertension 10/31/2007  . ACID REFLUX DISEASE 10/31/2007  . CAD S/P PTCA-PCI then CABG x4 12/01/1995  . ST elevation myocardial infarction (STEMI) of inferior wall, subsequent episode of  care 10/30/1985    Miller,Jennifer L 10/23/2014, 1:32 PM  [facility] [address] [place], [place], [address] Phone: [phone]   Fax:  [phone]     Geoffry Paradise, PT,DPT 10/23/2014 1:32 PM Phone: [phone] Fax: [phone]

## 2014-10-23 NOTE — Patient Instructions (Addendum)
HIP: Hamstrings - Short Sitting   Rest leg on raised surface or on floor. Keep knee straight. Lift chest. Hold _30__ seconds. Repeat with other leg. _3__ reps per set, _2-3__ sets per day, _7__ days per week  Copyright  VHI. All rights reserved.    Achilles Tendon Stretch   Stand with hands supported on counter, elbows slightly bent, feet parallel and both heels on floor, front knee bent, back knee straight. Slowly relax back knee until a stretch is felt in achilles tendon. Hold __30__ seconds. Repeat with leg positions switched. Repeat 3 times per session, 2-3 times a day. 7 days a week.  Copyright  VHI. All rights reserved.    Functional Quadriceps: Sit to Stand   Sit on edge of chair, feet flat on floor. Stand upright, extending knees fully. Repeat _10___ times per set. Do __1__ sets per session. Do __2__ sessions per day.  http://orth.exer.us/734   Copyright  VHI. All rights reserved.   Feet Apart (Compliant Surface) Head Motion - Eyes Open   With eyes open, standing on compliant surface: ___pillow_____, feet shoulder width apart, move head slowly: up and down and side to side for 30 seconds. Repeat __3__ times per session. Do __1__ sessions per day.  Copyright  VHI. All rights reserved.  Feet Together, Varied Arm Positions - Eyes Open   With eyes open, feet together, arms at your side, look straight ahead at a stationary object. Hold _30___ seconds. Repeat __3__ times per session. Do __1__ sessions per day.  Copyright  VHI. All rights reserved.  Feet Partial Heel-Toe, Varied Arm Positions - Eyes Open   With eyes open, right foot partially in front of the other, arms at your side, look straight ahead at a stationary object. Hold __30__ seconds. Repeat with left foot partially in front. Repeat __3__ times per session. Do __1__ sessions per day.  Copyright  VHI. All rights reserved.  Single Leg - Eyes Open   Holding support, lift right leg while maintaining  balance over other leg. Progress to removing hands from support surface for longer periods of time. Repeat with left leg lifted. Hold_10-30___ seconds. Repeat __3__ times per session. Do __1__ sessions per day.  Copyright  VHI. All rights reserved.    **Perform all balance exercises in corner with a chair in front of you for safety.

## 2014-10-24 ENCOUNTER — Other Ambulatory Visit: Payer: Self-pay | Admitting: Internal Medicine

## 2014-10-26 ENCOUNTER — Ambulatory Visit: Payer: Medicare Other

## 2014-10-26 ENCOUNTER — Ambulatory Visit (INDEPENDENT_AMBULATORY_CARE_PROVIDER_SITE_OTHER): Payer: Medicare Other | Admitting: Pharmacist Clinician (PhC)/ Clinical Pharmacy Specialist

## 2014-10-26 DIAGNOSIS — I4891 Unspecified atrial fibrillation: Secondary | ICD-10-CM

## 2014-10-26 DIAGNOSIS — R269 Unspecified abnormalities of gait and mobility: Secondary | ICD-10-CM | POA: Diagnosis not present

## 2014-10-26 DIAGNOSIS — Z7901 Long term (current) use of anticoagulants: Secondary | ICD-10-CM | POA: Diagnosis not present

## 2014-10-26 DIAGNOSIS — R270 Ataxia, unspecified: Secondary | ICD-10-CM

## 2014-10-26 LAB — POCT INR: INR: 5.1

## 2014-10-26 NOTE — Patient Instructions (Addendum)
Toe / Heel Raise (Standing)   Standing with support, raise heels, then rock back on heels and raise toes. Be careful not to bend at the hips. Repeat __20__ times. Perform 4 times/week.   Copyright  VHI. All rights reserved.

## 2014-10-27 NOTE — Therapy (Signed)
[facility] [address] [place], [place], [address] Phone: [phone]   Fax:  [phone]  Physical Therapy Treatment  Patient Details  Name: Timothy Mclean MRN: [id number] Date of Birth: December 20, 1940 Referring Provider:  Derinda Late, MD  Encounter Date: 10/26/2014      PT End of Session - 10/27/14 1058    Visit Number 3   Number of Visits 9   Date for PT Re-Evaluation 11/23/14   Authorization Type medicare g-code needed   PT Start Time 7253   PT Stop Time 6644   PT Time Calculation (min) 41 min   Equipment Utilized During Treatment Gait belt   Activity Tolerance Patient tolerated treatment well   Behavior During Therapy [facility] for tasks assessed/performed      Past Medical History  Diagnosis Date  . Gout, unspecified   . Iron deficiency anemia, unspecified   . Other and unspecified hyperlipidemia   . Esophageal reflux   . Unspecified essential hypertension   . Type II or unspecified type diabetes mellitus without mention of complication, not stated as uncontrolled   . Ulcerative colitis, unspecified   . Other B-complex deficiencies   . ST elevation myocardial infarction (STEMI) of inferior wall, subsequent episode of care [place]    a) PTCA of Cx; b) CABG   . CAD S/P percutaneous coronary angioplasty 1987    angioplasty and  STENT- Cx; MYOVIEW 1/'05: ? Ischemia vs. artifact --> sent for cath  . S/P CABG x 4 1997    a) LIMA-LAD, SVG to OM, SVG-dRCA-RPL; b) FALSE + MYOVIEW --> CATH 1/'05: 100% LAD after widelly patent D1, all Cx OM branches 100%, pRCA 100%, SVG-PDA patent w/ retrograde filling of RPL, SVG-OM widely patent ~ normal OM, LIMA-LAD patent.  [name] PAF (paroxysmal atrial fibrillation)     Last reported episode 2012; on amiodarone and anticoagulated on warfarin  . Dyslipidemia, goal LDL below 70     Doing better off of Zocor, currently on Lescol   . GERD (gastroesophageal reflux disease)   . DDD  (degenerative disc disease)   . Spinal stenosis   . Barrett's esophagus 03/09/2014  . Personal history of colonic polyps     Past Surgical History  Procedure Laterality Date  . Coronary artery bypass graft  11/1995    LIMA-LAD, SVG to OM, SVG-dRCA-RPL  . Cholecystectomy    . Cardiac catheterization  08/17/2003    "False positive stress test " grafts patent; RCA proximal 100% LAD 100% occlusion after normal D1 with 80% ostial as SP1; circumflex patent but OM1 on to all occluded; EF 50-55%  . Cardiac catheterization  11/1995    Preop CABG: LAD 90% at D1, circumflex 100 and OM a 90% proximal, 80% distal  . Nm myoview ltd  2005    Questionable ischemia that is likely either infarct versus artifact; normal  . Colonoscopy    . Flexible sigmoidoscopy    . Esophagogastroduodenoscopy    . Transthoracic echocardiogram  12/03/2008    LVEF =>55% normal study  . Nm myocar perf ejection fraction  07/03/2003    FALSE POSITIVE TEST; Bruce protocol, negative test with scintigraphic evidence of inferoapical scar, diaphragmatic attenuation, moderate ischemia.  . Carotid duplex doppler  12/23/2009    Right&Left ICAs 0-49%, mildly abnormal study,     There were no vitals filed for this visit.  Visit Diagnosis:  Abnormality of gait - Plan: PT plan of care cert/re-cert  Ataxia  Subjective Assessment - 10/26/14 1535    Subjective Pt denied falls or changes since last visit. Pt has been performing HEP as prescribed.   How long can you sit comfortably? no problems   How long can you stand comfortably? can't stand too long; limited by fatigue; denies pain   How long can you walk comfortably? feels he can walk 1/2 mile maybe a mile   Patient Stated Goals want better balance and leg strength   Currently in Pain? No/denies                       [facility] Adult PT Treatment/Exercise - 10/26/14 1536    Ambulation/Gait   Ambulation/Gait Yes   Ambulation/Gait Assistance 5: Supervision    Ambulation/Gait Assistance Details Pt ambulated over even terrain while performing head turns. VC's to improve upright posture, stride length, and to improve eccentric control of B anterior tibialis. Pt required one seated rest break.   Ambulation Distance (Feet) --  460', 75', 50'x2   Assistive device Straight cane   Gait Pattern Decreased trunk rotation;Wide base of support;Trunk flexed;Decreased stride length;Step-through pattern   Ambulation Surface Level;Indoor   High Level Balance   High Level Balance Activities Side stepping;Backward walking;Head turns;Tandem walking;Marching forwards;Other (comment)  Tandem amb, marching with head turns, toe/heel gait   High Level Balance Comments 4x7'/activity. Cues for upright posture, improve eccentric control, improved stride length. Pt noted to experience difficulty with toe/heel ambulation 2/2 impaired strength. Pt performed B heel/toe raises x10/activity.  All performed with supervision except min A required during 1 LOB episode during backwards amb.                PT Education - 10/27/14 1058    Education provided Yes   Education Details Heel/toe raises HEP   Person(s) Educated Patient   Methods Explanation;Demonstration;Verbal cues;Handout   Comprehension Verbalized understanding;Returned demonstration          PT Short Term Goals - 10/19/14 1534    PT SHORT TERM GOAL #1   Title see LTG's  only 4 week POC           PT Long Term Goals - 10/27/14 1102    PT LONG TERM GOAL #1   Title Pt will demonstrate a lowered fall risk evident by scoring >50/56 on BERG balance test. Target date: 11/15/14.   Baseline 43/56   Time 4   Period Weeks   Status On-going   PT LONG TERM GOAL #2   Title Pt will demonstrate compentency with comprehensive HEP to address balance reaction and flexibility deficits. Target date: 11/15/14.   Time 4   Period Weeks   Status On-going   PT LONG TERM GOAL #3   Title Patient will demonstrate an improved  gait pattern evident by narrowed base of support and disassociation of head/eye/body mvt while walking using LRAD at MOD I level. Target date: 11/15/14.   Time 4   Period Weeks   Status On-going   PT LONG TERM GOAL #4   Title Pt will ambulate 600' over uneven terrain with LRAD, without LOB, at MOD I level to improve functional mobillity and decrease falls risk. Target date: 11/15/14.   Status New   PT LONG TERM GOAL #5   Title Perform TUG and write goal if appropriate. Target date: 11/15/14.   Status New   Additional Long Term Goals   Additional Long Term Goals Yes   PT LONG TERM GOAL #6   Title  Pt will improve gait speed with LRAD to >/=2.61ft/sec. to ambulate safely in the community. Target date: 11/15/14.   Status New               Plan - 10/27/14 1058    Clinical Impression Statement Pt experienced difficulty during heel/toe raises 2/2 impaired balance and anterior tibialis/gastroc strength. Pt's B LE impaired eccentric control of dorsiflexion during gait would improve with strengthening of ant. tib/gastrocs. PT will send a re-cert to MD to include all applicable pt deficits and appopropriate interventions.   Pt will benefit from skilled therapeutic intervention in order to improve on the following deficits Abnormal gait;Decreased balance;Decreased range of motion;Impaired flexibility;Decreased strength;Decreased mobility   Rehab Potential Good   PT Frequency 2x / week   PT Duration 4 weeks   PT Treatment/Interventions Therapeutic activities;Therapeutic exercise;Balance training;Gait training;Stair training;Neuromuscular re-education;Functional mobility training;Patient/family education;ADLs/Self Care Home Management;Biofeedback;Manual techniques   PT Next Visit Plan Dynamic gait and balance training: trial on compliant surfaces   Consulted and Agree with Plan of Care Patient        Problem List Patient Active Problem List   Diagnosis Date Noted  . Barrett's esophagus  03/09/2014  . Hypotension due to drugs 03/06/2014  . Emphysema of lung 02/19/2014  . Cough 02/19/2014  . H/O amiodarone therapy 02/16/2013  . S/P CABG x 4   . Persistent atrial fibrillation   . Dyslipidemia, goal LDL below 70   . Personal history of colonic polyps 09/10/2011  . UC (ulcerative colitis) 09/10/2011  . Anticoagulant long-term use 09/10/2011  . VITAMIN B12 DEFICIENCY 01/30/2009  . Type 2 diabetes mellitus with complication - CAD 64/15/8309  . GOUT 10/31/2007  . Essential hypertension 10/31/2007  . ACID REFLUX DISEASE 10/31/2007  . CAD S/P PTCA-PCI then CABG x4 12/01/1995  . ST elevation myocardial infarction (STEMI) of inferior wall, subsequent episode of care 10/30/1985    Gaylen Pereira L 10/27/2014, 11:11 AM  [facility] [address] [place], [place], [address] Phone: [phone]   Fax:  [phone]     Geoffry Paradise, PT,DPT 10/27/2014 11:11 AM Phone: [phone] Fax: [phone]

## 2014-10-29 ENCOUNTER — Ambulatory Visit: Payer: Medicare Other | Admitting: Physical Therapy

## 2014-10-29 ENCOUNTER — Encounter: Payer: Self-pay | Admitting: Physical Therapy

## 2014-10-29 DIAGNOSIS — R269 Unspecified abnormalities of gait and mobility: Secondary | ICD-10-CM

## 2014-10-29 DIAGNOSIS — R270 Ataxia, unspecified: Secondary | ICD-10-CM

## 2014-10-29 NOTE — Therapy (Signed)
[facility] [address] [place], [place], [address] Phone: [phone]   Fax:  [phone]  Physical Therapy Treatment  Patient Details  Name: Timothy Mclean MRN: [id number] Date of Birth: [birth year] Referring Provider:  Derinda Late, MD  Encounter Date: 10/29/2014      PT End of Session - 10/29/14 1451    Visit Number 4   Number of Visits 9   Date for PT Re-Evaluation 11/23/14   Authorization Type medicare g-code needed   PT Start Time 1448   PT Stop Time 1530   PT Time Calculation (min) 42 min   Equipment Utilized During Treatment Gait belt   Activity Tolerance Patient tolerated treatment well   Behavior During Therapy [facility] for tasks assessed/performed      Past Medical History  Diagnosis Date  . Gout, unspecified   . Iron deficiency anemia, unspecified   . Other and unspecified hyperlipidemia   . Esophageal reflux   . Unspecified essential hypertension   . Type II or unspecified type diabetes mellitus without mention of complication, not stated as uncontrolled   . Ulcerative colitis, unspecified   . Other B-complex deficiencies   . ST elevation myocardial infarction (STEMI) of inferior wall, subsequent episode of care [place]    a) PTCA of Cx; b) CABG   . CAD S/P percutaneous coronary angioplasty 1987    angioplasty and  STENT- Cx; MYOVIEW 1/'05: ? Ischemia vs. artifact --> sent for cath  . S/P CABG x 4 1997    a) LIMA-LAD, SVG to OM, SVG-dRCA-RPL; b) FALSE + MYOVIEW --> CATH 1/'05: 100% LAD after widelly patent D1, all Cx OM branches 100%, pRCA 100%, SVG-PDA patent w/ retrograde filling of RPL, SVG-OM widely patent ~ normal OM, LIMA-LAD patent.  [name] PAF (paroxysmal atrial fibrillation)     Last reported episode 2012; on amiodarone and anticoagulated on warfarin  . Dyslipidemia, goal LDL below 70     Doing better off of Zocor, currently on Lescol   . GERD (gastroesophageal reflux disease)   . DDD  (degenerative disc disease)   . Spinal stenosis   . Barrett's esophagus 03/09/2014  . Personal history of colonic polyps     Past Surgical History  Procedure Laterality Date  . Coronary artery bypass graft  11/1995    LIMA-LAD, SVG to OM, SVG-dRCA-RPL  . Cholecystectomy    . Cardiac catheterization  08/17/2003    "False positive stress test " grafts patent; RCA proximal 100% LAD 100% occlusion after normal D1 with 80% ostial as SP1; circumflex patent but OM1 on to all occluded; EF 50-55%  . Cardiac catheterization  11/1995    Preop CABG: LAD 90% at D1, circumflex 100 and OM a 90% proximal, 80% distal  . Nm myoview ltd  2005    Questionable ischemia that is likely either infarct versus artifact; normal  . Colonoscopy    . Flexible sigmoidoscopy    . Esophagogastroduodenoscopy    . Transthoracic echocardiogram  12/03/2008    LVEF =>55% normal study  . Nm myocar perf ejection fraction  07/03/2003    FALSE POSITIVE TEST; Bruce protocol, negative test with scintigraphic evidence of inferoapical scar, diaphragmatic attenuation, moderate ischemia.  . Carotid duplex doppler  12/23/2009    Right&Left ICAs 0-49%, mildly abnormal study,     There were no vitals filed for this visit.  Visit Diagnosis:  Abnormality of gait  Ataxia      Subjective Assessment - 10/29/14 1450  Subjective No new complaints. No falls or pain to report. Does feel the home exercise of heel/toe raises is working his legs.   Currently in Pain? No/denies            [facility] Adult PT Treatment/Exercise - 10/29/14 1452    Ambulation/Gait   Ambulation/Gait Yes   Ambulation/Gait Assistance 5: Supervision   Ambulation/Gait Assistance Details cues on posture and equal step length with gait   Ambulation Distance (Feet) 80 Feet  x1, plus in gym for activites   Assistive device Straight cane   Gait Pattern Decreased trunk rotation;Wide base of support;Trunk flexed;Decreased stride length;Step-through pattern    Ambulation Surface Level;Indoor   Dynamic Standing Balance   Dynamic Standing - Balance Support No upper extremity supported;During functional activity   Dynamic Standing - Level of Assistance 4: Min assist  and cane support   Dynamic Standing - Balance Activities Alternating  foot traps;Compliant surfaces   Dynamic Standing - Comments on both red mats with 6 cones in row at edge: alternating toe taps to each cone with side stepping x 1 lap each way, alternating double toe taps to each cone with side stepping x 1 lap each way.;Balance board (both ways on board): hold steady with eyes closed, with eyes open rocking board, with eyes open head turns and nods. mod cues (verbal and tactile) on posture and ex form/technique.                  High Level Balance   High Level Balance Activities Side stepping;Marching forwards;Marching backwards;Tandem walking   High Level Balance Comments on red/blue mats by counter: 3 laps each/each way with up to min assist for balance and mod cues on posture.                  Knee/Hip Exercises: Aerobic   Stationary Bike Scifit x 4 extremities level 3.0 x 8 minutes with goal rpm >/= 70 for stengthening and activity tolerance.                  PT Short Term Goals - 10/19/14 1534    PT SHORT TERM GOAL #1   Title see LTG's  only 4 week POC           PT Long Term Goals - 10/27/14 1102    PT LONG TERM GOAL #1   Title Pt will demonstrate a lowered fall risk evident by scoring >50/56 on BERG balance test. Target date: 11/15/14.   Baseline 43/56   Time 4   Period Weeks   Status On-going   PT LONG TERM GOAL #2   Title Pt will demonstrate compentency with comprehensive HEP to address balance reaction and flexibility deficits. Target date: 11/15/14.   Time 4   Period Weeks   Status On-going   PT LONG TERM GOAL #3   Title Patient will demonstrate an improved gait pattern evident by narrowed base of support and disassociation of head/eye/body mvt while  walking using LRAD at MOD I level. Target date: 11/15/14.   Time 4   Period Weeks   Status On-going   PT LONG TERM GOAL #4   Title Pt will ambulate 600' over uneven terrain with LRAD, without LOB, at MOD I level to improve functional mobillity and decrease falls risk. Target date: 11/15/14.   Status New   PT LONG TERM GOAL #5   Title Perform TUG and write goal if appropriate. Target date: 11/15/14.   Status New   Additional  Long Term Goals   Additional Long Term Goals Yes   PT LONG TERM GOAL #6   Title Pt will improve gait speed with LRAD to >/=2.80ft/sec. to ambulate safely in the community. Target date: 11/15/14.   Status New               Plan - 10/29/14 1451    Clinical Impression Statement Pt with difficulting with heel and toe walking, especially on complaint surfaces. Needs continued lower extremitiy strengthening. Progressing toward goals.   Pt will benefit from skilled therapeutic intervention in order to improve on the following deficits Abnormal gait;Decreased balance;Decreased range of motion;Impaired flexibility;Decreased strength;Decreased mobility   Rehab Potential Good   PT Frequency 2x / week   PT Duration 4 weeks   PT Treatment/Interventions Therapeutic activities;Therapeutic exercise;Balance training;Gait training;Stair training;Neuromuscular re-education;Functional mobility training;Patient/family education;ADLs/Self Care Home Management;Biofeedback;Manual techniques   PT Next Visit Plan Dynamic gait and balance training on compliant surfaces;strengthening of anterior tib's/ankles   Consulted and Agree with Plan of Care Patient        Problem List Patient Active Problem List   Diagnosis Date Noted  . Barrett's esophagus 03/09/2014  . Hypotension due to drugs 03/06/2014  . Emphysema of lung 02/19/2014  . Cough 02/19/2014  . H/O amiodarone therapy 02/16/2013  . S/P CABG x 4   . Persistent atrial fibrillation   . Dyslipidemia, goal LDL below 70   .  Personal history of colonic polyps 09/10/2011  . UC (ulcerative colitis) 09/10/2011  . Anticoagulant long-term use 09/10/2011  . VITAMIN B12 DEFICIENCY 01/30/2009  . Type 2 diabetes mellitus with complication - CAD 46/50/3546  . GOUT 10/31/2007  . Essential hypertension 10/31/2007  . ACID REFLUX DISEASE 10/31/2007  . CAD S/P PTCA-PCI then CABG x4 12/01/1995  . ST elevation myocardial infarction (STEMI) of inferior wall, subsequent episode of care 10/30/1985    Willow Ora 10/29/2014, 5:46 PM  Willow Ora, PTA, [facility] [address] [place], [place] [address] [phone] 10/29/2014, 5:47 PM

## 2014-11-01 ENCOUNTER — Ambulatory Visit (INDEPENDENT_AMBULATORY_CARE_PROVIDER_SITE_OTHER): Payer: Medicare Other | Admitting: Pharmacist Clinician (PhC)/ Clinical Pharmacy Specialist

## 2014-11-01 DIAGNOSIS — I4891 Unspecified atrial fibrillation: Secondary | ICD-10-CM

## 2014-11-01 DIAGNOSIS — Z7901 Long term (current) use of anticoagulants: Secondary | ICD-10-CM | POA: Diagnosis not present

## 2014-11-01 LAB — POCT INR: INR: 2.1

## 2014-11-02 ENCOUNTER — Ambulatory Visit: Payer: Medicare Other

## 2014-11-02 DIAGNOSIS — R269 Unspecified abnormalities of gait and mobility: Secondary | ICD-10-CM

## 2014-11-02 DIAGNOSIS — R270 Ataxia, unspecified: Secondary | ICD-10-CM

## 2014-11-02 NOTE — Therapy (Signed)
[facility] [address] [place], [place], [address] Phone: [phone]   Fax:  [phone]  Physical Therapy Treatment  Patient Details  Name: Timothy Mclean MRN: [id number] Date of Birth: 1940-11-09 Referring Provider:  Derinda Late, MD  Encounter Date: 11/02/2014      PT End of Session - 11/02/14 1248    Visit Number 5   Number of Visits 9   Date for PT Re-Evaluation 11/23/14   Authorization Type medicare g-code needed   PT Start Time 0845   PT Stop Time 0928   PT Time Calculation (min) 43 min   Equipment Utilized During Treatment Gait belt   Activity Tolerance Patient tolerated treatment well   Behavior During Therapy [facility] for tasks assessed/performed      Past Medical History  Diagnosis Date  . Gout, unspecified   . Iron deficiency anemia, unspecified   . Other and unspecified hyperlipidemia   . Esophageal reflux   . Unspecified essential hypertension   . Type II or unspecified type diabetes mellitus without mention of complication, not stated as uncontrolled   . Ulcerative colitis, unspecified   . Other B-complex deficiencies   . ST elevation myocardial infarction (STEMI) of inferior wall, subsequent episode of care [place]    a) PTCA of Cx; b) CABG   . CAD S/P percutaneous coronary angioplasty 1987    angioplasty and  STENT- Cx; MYOVIEW 1/'05: ? Ischemia vs. artifact --> sent for cath  . S/P CABG x 4 1997    a) LIMA-LAD, SVG to OM, SVG-dRCA-RPL; b) FALSE + MYOVIEW --> CATH 1/'05: 100% LAD after widelly patent D1, all Cx OM branches 100%, pRCA 100%, SVG-PDA patent w/ retrograde filling of RPL, SVG-OM widely patent ~ normal OM, LIMA-LAD patent.  [name] PAF (paroxysmal atrial fibrillation)     Last reported episode 2012; on amiodarone and anticoagulated on warfarin  . Dyslipidemia, goal LDL below 70     Doing better off of Zocor, currently on Lescol   . GERD (gastroesophageal reflux disease)   . DDD  (degenerative disc disease)   . Spinal stenosis   . Barrett's esophagus 03/09/2014  . Personal history of colonic polyps     Past Surgical History  Procedure Laterality Date  . Coronary artery bypass graft  11/1995    LIMA-LAD, SVG to OM, SVG-dRCA-RPL  . Cholecystectomy    . Cardiac catheterization  08/17/2003    "False positive stress test " grafts patent; RCA proximal 100% LAD 100% occlusion after normal D1 with 80% ostial as SP1; circumflex patent but OM1 on to all occluded; EF 50-55%  . Cardiac catheterization  11/1995    Preop CABG: LAD 90% at D1, circumflex 100 and OM a 90% proximal, 80% distal  . Nm myoview ltd  2005    Questionable ischemia that is likely either infarct versus artifact; normal  . Colonoscopy    . Flexible sigmoidoscopy    . Esophagogastroduodenoscopy    . Transthoracic echocardiogram  12/03/2008    LVEF =>55% normal study  . Nm myocar perf ejection fraction  07/03/2003    FALSE POSITIVE TEST; Bruce protocol, negative test with scintigraphic evidence of inferoapical scar, diaphragmatic attenuation, moderate ischemia.  . Carotid duplex doppler  12/23/2009    Right&Left ICAs 0-49%, mildly abnormal study,     There were no vitals filed for this visit.  Visit Diagnosis:  Abnormality of gait  Ataxia      Subjective Assessment - 11/02/14 0847  Subjective Pt denied falls or changes since last visit.    How long can you sit comfortably? no problems   How long can you stand comfortably? can't stand too long; limited by fatigue; denies pain   How long can you walk comfortably? feels he can walk 1/2 mile maybe a mile   Patient Stated Goals want better balance and leg strength   Currently in Pain? No/denies                       [facility] Adult PT Treatment/Exercise - 11/02/14 0848    Ambulation/Gait   Ambulation/Gait Yes   Ambulation/Gait Assistance 5: Supervision;4: Min guard   Ambulation/Gait Assistance Details Pt ambulated while performing  head turns and 180 degree turns. Cues to look straight ahead and to improve heel strike. No overt LOB episodes.   Ambulation Distance (Feet) 340 Feet  plus 117' after balance foam stepping exercise   Assistive device Straight cane;None   Gait Pattern Decreased trunk rotation;Wide base of support;Trunk flexed;Decreased stride length;Step-through pattern   Ambulation Surface Level;Indoor   Stairs Yes   Stairs Assistance 5: Supervision;4: Min guard   Stairs Assistance Details (indicate cue type and reason) VC's to improve ant weight shifting and to keep stance LE extended while descending steps for safety and to decrease risk of knee buckling. Performed x2.   Stair Management Technique One rail Right;Alternating pattern   Number of Stairs 8   Height of Stairs 6   Ramp 5: Supervision   Ramp Details (indicate cue type and reason) VC's to improve weight shifting.   Balance   Balance Assessed Yes   Dynamic Standing Balance   Dynamic Standing - Balance Support Right upper extremity supported   Dynamic Standing - Level of Assistance 4: Min assist;Other (comment)  min guard   Dynamic Standing - Balance Activities Other (comment);Alternating  foot traps  single taps on cones while standing on foam,no UE support.   Dynamic Standing - Comments With one UE support on counter: stepping with B LEs and weigth shifting onto front LE to improve toe off (pre-swing LE) and knee ext on stance leg x20/LE and over 1" beam x20/LE. VC's for technique, looking straight ahead and for sequencing.   Standardized Balance Assessment   Standardized Balance Assessment Timed Up and Go Test   Timed Up and Go Test   TUG Normal TUG   Normal TUG (seconds) 11.27  without AD   High Level Balance   High Level Balance Activities Side stepping;Backward walking;Head turns;Marching forwards;Other (comment)   High Level Balance Comments On blue mats with 1 UE support and min gaurd-min A to maintain balance: forward amb with and  without head turns with eyes open and closed, marches with and without head turns, sidestepping, backwards amb. 3 LOB episodes (min A required). 6x7'/activity. VC's for technique and to improve lateral weight shifting, stride length and toe off.                  PT Short Term Goals - 10/19/14 1534    PT SHORT TERM GOAL #1   Title see LTG's  only 4 week POC           PT Long Term Goals - 10/27/14 1102    PT LONG TERM GOAL #1   Title Pt will demonstrate a lowered fall risk evident by scoring >50/56 on BERG balance test. Target date: 11/15/14.   Baseline 43/56   Time 4   Period  Weeks   Status On-going   PT LONG TERM GOAL #2   Title Pt will demonstrate compentency with comprehensive HEP to address balance reaction and flexibility deficits. Target date: 11/15/14.   Time 4   Period Weeks   Status On-going   PT LONG TERM GOAL #3   Title Patient will demonstrate an improved gait pattern evident by narrowed base of support and disassociation of head/eye/body mvt while walking using LRAD at MOD I level. Target date: 11/15/14.   Time 4   Period Weeks   Status On-going   PT LONG TERM GOAL #4   Title Pt will ambulate 600' over uneven terrain with LRAD, without LOB, at MOD I level to improve functional mobillity and decrease falls risk. Target date: 11/15/14.   Status New   PT LONG TERM GOAL #5   Title Perform TUG and write goal if appropriate. Target date: 11/15/14.   Status New   Additional Long Term Goals   Additional Long Term Goals Yes   PT LONG TERM GOAL #6   Title Pt will improve gait speed with LRAD to >/=2.10ft/sec. to ambulate safely in the community. Target date: 11/15/14.   Status New               Plan - 11/02/14 1248    Clinical Impression Statement Pt continues to experience difficulty with B toe off during preswing due to decrease B gastroc strength. Pt required min A to maintain balance during LOB episodes due to ant weight shifting forward too far and due  to occasional B knee flexion in stance phase. Continue with POC.   Pt will benefit from skilled therapeutic intervention in order to improve on the following deficits Abnormal gait;Decreased balance;Decreased range of motion;Impaired flexibility;Decreased strength;Decreased mobility   Rehab Potential Good   PT Frequency 2x / week   PT Duration 4 weeks   PT Treatment/Interventions Therapeutic activities;Therapeutic exercise;Balance training;Gait training;Stair training;Neuromuscular re-education;Functional mobility training;Patient/family education;ADLs/Self Care Home Management;Biofeedback;Manual techniques   PT Next Visit Plan Dynamic gait and balance training on compliant surfaces;strengthening of B anterior tib/gastroc   PT Home Exercise Plan Balance and strengthening HEP   Consulted and Agree with Plan of Care Patient        Problem List Patient Active Problem List   Diagnosis Date Noted  . Barrett's esophagus 03/09/2014  . Hypotension due to drugs 03/06/2014  . Emphysema of lung 02/19/2014  . Cough 02/19/2014  . H/O amiodarone therapy 02/16/2013  . S/P CABG x 4   . Persistent atrial fibrillation   . Dyslipidemia, goal LDL below 70   . Personal history of colonic polyps 09/10/2011  . UC (ulcerative colitis) 09/10/2011  . Anticoagulant long-term use 09/10/2011  . VITAMIN B12 DEFICIENCY 01/30/2009  . Type 2 diabetes mellitus with complication - CAD 38/46/6599  . GOUT 10/31/2007  . Essential hypertension 10/31/2007  . ACID REFLUX DISEASE 10/31/2007  . CAD S/P PTCA-PCI then CABG x4 12/01/1995  . ST elevation myocardial infarction (STEMI) of inferior wall, subsequent episode of care 10/30/1985    Pranavi Aure L 11/02/2014, 12:51 PM  [facility] [address] [place], [place], [address] Phone: [phone]   Fax:  [phone]     Geoffry Paradise, PT,DPT 11/02/2014 12:51 PM Phone: [phone] Fax:  [phone]

## 2014-11-07 ENCOUNTER — Encounter: Payer: Self-pay | Admitting: Physical Therapy

## 2014-11-07 ENCOUNTER — Ambulatory Visit: Payer: Medicare Other | Admitting: Physical Therapy

## 2014-11-07 DIAGNOSIS — R269 Unspecified abnormalities of gait and mobility: Secondary | ICD-10-CM

## 2014-11-07 DIAGNOSIS — R270 Ataxia, unspecified: Secondary | ICD-10-CM

## 2014-11-07 NOTE — Therapy (Signed)
[facility] [address] [place], [place], [address] Phone: [phone]   Fax:  [phone]  Physical Therapy Treatment  Patient Details  Name: Timothy Mclean MRN: [id number] Date of Birth: 1941/05/08 Referring Provider:  Derinda Late, MD  Encounter Date: 11/07/2014      PT End of Session - 11/07/14 0853    Visit Number 6   Number of Visits 9   Date for PT Re-Evaluation 11/23/14   Authorization Type medicare g-code needed   PT Start Time 0850   PT Stop Time 0930   PT Time Calculation (min) 40 min   Equipment Utilized During Treatment Gait belt   Activity Tolerance Patient tolerated treatment well   Behavior During Therapy [facility] for tasks assessed/performed      Past Medical History  Diagnosis Date  . Gout, unspecified   . Iron deficiency anemia, unspecified   . Other and unspecified hyperlipidemia   . Esophageal reflux   . Unspecified essential hypertension   . Type II or unspecified type diabetes mellitus without mention of complication, not stated as uncontrolled   . Ulcerative colitis, unspecified   . Other B-complex deficiencies   . ST elevation myocardial infarction (STEMI) of inferior wall, subsequent episode of care [place]    a) PTCA of Cx; b) CABG   . CAD S/P percutaneous coronary angioplasty 1987    angioplasty and  STENT- Cx; MYOVIEW 1/'05: ? Ischemia vs. artifact --> sent for cath  . S/P CABG x 4 1997    a) LIMA-LAD, SVG to OM, SVG-dRCA-RPL; b) FALSE + MYOVIEW --> CATH 1/'05: 100% LAD after widelly patent D1, all Cx OM branches 100%, pRCA 100%, SVG-PDA patent w/ retrograde filling of RPL, SVG-OM widely patent ~ normal OM, LIMA-LAD patent.  [name] PAF (paroxysmal atrial fibrillation)     Last reported episode 2012; on amiodarone and anticoagulated on warfarin  . Dyslipidemia, goal LDL below 70     Doing better off of Zocor, currently on Lescol   . GERD (gastroesophageal reflux disease)   . DDD  (degenerative disc disease)   . Spinal stenosis   . Barrett's esophagus 03/09/2014  . Personal history of colonic polyps     Past Surgical History  Procedure Laterality Date  . Coronary artery bypass graft  11/1995    LIMA-LAD, SVG to OM, SVG-dRCA-RPL  . Cholecystectomy    . Cardiac catheterization  08/17/2003    "False positive stress test " grafts patent; RCA proximal 100% LAD 100% occlusion after normal D1 with 80% ostial as SP1; circumflex patent but OM1 on to all occluded; EF 50-55%  . Cardiac catheterization  11/1995    Preop CABG: LAD 90% at D1, circumflex 100 and OM a 90% proximal, 80% distal  . Nm myoview ltd  2005    Questionable ischemia that is likely either infarct versus artifact; normal  . Colonoscopy    . Flexible sigmoidoscopy    . Esophagogastroduodenoscopy    . Transthoracic echocardiogram  12/03/2008    LVEF =>55% normal study  . Nm myocar perf ejection fraction  07/03/2003    FALSE POSITIVE TEST; Bruce protocol, negative test with scintigraphic evidence of inferoapical scar, diaphragmatic attenuation, moderate ischemia.  . Carotid duplex doppler  12/23/2009    Right&Left ICAs 0-49%, mildly abnormal study,     There were no vitals filed for this visit.  Visit Diagnosis:  Abnormality of gait  Ataxia      Subjective Assessment - 11/07/14 6226  Subjective No new complaints. No falls or pain to report.   Currently in Pain? No/denies           [facility] Adult PT Treatment/Exercise - 11/07/14 0856    Ambulation/Gait   Ambulation/Gait Yes   Ambulation/Gait Assistance 5: Supervision   Ambulation/Gait Assistance Details cues on posture at times and to increase foot clearance                       Ambulation Distance (Feet) 500 Feet   Assistive device Straight cane   Ambulation Surface Level;Indoor;Outdoor;Paved;Gravel;Grass;Unlevel   High Level Balance   High Level Balance Activities Negotiating over obstacles;Side stepping;Marching forwards;Marching  backwards;Tandem walking   High Level Balance Comments stepping over 4 bolsters of varied heights x 6 laps with cane support and min guard assist/cues. On red mats: high knee marching forward/backward and tandem gait forward/backward x 3 laps each with cane and min guard assist; with 6 cones in a row along edge of mat: alternating toe taps to each cone with side stepping x 2 laps each way, alternating double toe taps with side stepping x 2 laps each way with cane. Up to min assist for balance with single leg stance activites.                                        PT Short Term Goals - 10/19/14 1534    PT SHORT TERM GOAL #1   Title see LTG's  only 4 week POC           PT Long Term Goals - 10/27/14 1102    PT LONG TERM GOAL #1   Title Pt will demonstrate a lowered fall risk evident by scoring >50/56 on BERG balance test. Target date: 11/15/14.   Baseline 43/56   Time 4   Period Weeks   Status On-going   PT LONG TERM GOAL #2   Title Pt will demonstrate compentency with comprehensive HEP to address balance reaction and flexibility deficits. Target date: 11/15/14.   Time 4   Period Weeks   Status On-going   PT LONG TERM GOAL #3   Title Patient will demonstrate an improved gait pattern evident by narrowed base of support and disassociation of head/eye/body mvt while walking using LRAD at MOD I level. Target date: 11/15/14.   Time 4   Period Weeks   Status On-going   PT LONG TERM GOAL #4   Title Pt will ambulate 600' over uneven terrain with LRAD, without LOB, at MOD I level to improve functional mobillity and decrease falls risk. Target date: 11/15/14.   Status New   PT LONG TERM GOAL #5   Title Perform TUG and write goal if appropriate. Target date: 11/15/14.   Status New   Additional Long Term Goals   Additional Long Term Goals Yes   PT LONG TERM GOAL #6   Title Pt will improve gait speed with LRAD to >/=2.32ft/sec. to ambulate safely in the community. Target date: 11/15/14.    Status New           Plan - 11/07/14 2952    Clinical Impression Statement Pt making steady progress toward goals. No balance issues noted with gait on outdoors uneven surfaces.   Pt will benefit from skilled therapeutic intervention in order to improve on the following deficits Abnormal gait;Decreased balance;Decreased range of motion;Impaired flexibility;Decreased strength;Decreased mobility  Rehab Potential Good   PT Frequency 2x / week   PT Duration 4 weeks   PT Treatment/Interventions Therapeutic activities;Therapeutic exercise;Balance training;Gait training;Stair training;Neuromuscular re-education;Functional mobility training;Patient/family education;ADLs/Self Care Home Management;Biofeedback;Manual techniques   PT Next Visit Plan Dynamic gait and balance training on compliant surfaces;strengthening of B anterior tib/gastroc   PT Home Exercise Plan Balance and strengthening HEP   Consulted and Agree with Plan of Care Patient        Problem List Patient Active Problem List   Diagnosis Date Noted  . Barrett's esophagus 03/09/2014  . Hypotension due to drugs 03/06/2014  . Emphysema of lung 02/19/2014  . Cough 02/19/2014  . H/O amiodarone therapy 02/16/2013  . S/P CABG x 4   . Persistent atrial fibrillation   . Dyslipidemia, goal LDL below 70   . Personal history of colonic polyps 09/10/2011  . UC (ulcerative colitis) 09/10/2011  . Anticoagulant long-term use 09/10/2011  . VITAMIN B12 DEFICIENCY 01/30/2009  . Type 2 diabetes mellitus with complication - CAD 83/38/2505  . GOUT 10/31/2007  . Essential hypertension 10/31/2007  . ACID REFLUX DISEASE 10/31/2007  . CAD S/P PTCA-PCI then CABG x4 12/01/1995  . ST elevation myocardial infarction (STEMI) of inferior wall, subsequent episode of care 10/30/1985    Willow Ora 11/07/2014, 8:13 PM  Willow Ora, PTA, [facility] [address], [place], [place] [address] [phone] 11/07/2014,  8:13 PM

## 2014-11-09 ENCOUNTER — Ambulatory Visit: Payer: Medicare Other | Admitting: Physical Therapy

## 2014-11-09 ENCOUNTER — Encounter: Payer: Self-pay | Admitting: Physical Therapy

## 2014-11-09 DIAGNOSIS — R270 Ataxia, unspecified: Secondary | ICD-10-CM

## 2014-11-09 DIAGNOSIS — R269 Unspecified abnormalities of gait and mobility: Secondary | ICD-10-CM

## 2014-11-09 NOTE — Therapy (Signed)
[facility] [address] [place], [place], [address] Phone: [phone]   Fax:  [phone]  Physical Therapy Treatment  Patient Details  Name: Timothy Mclean MRN: [id number] Date of Birth: Dec 24, 1940 Referring Provider:  Derinda Late, MD  Encounter Date: 11/09/2014      PT End of Session - 11/09/14 0937    Visit Number 6   Number of Visits 9   Date for PT Re-Evaluation 11/23/14   Authorization Type medicare g-code needed   PT Start Time 0934   Equipment Utilized During Treatment Gait belt   Activity Tolerance Patient tolerated treatment well   Behavior During Therapy [facility] for tasks assessed/performed      Past Medical History  Diagnosis Date  . Gout, unspecified   . Iron deficiency anemia, unspecified   . Other and unspecified hyperlipidemia   . Esophageal reflux   . Unspecified essential hypertension   . Type II or unspecified type diabetes mellitus without mention of complication, not stated as uncontrolled   . Ulcerative colitis, unspecified   . Other B-complex deficiencies   . ST elevation myocardial infarction (STEMI) of inferior wall, subsequent episode of care [place]    a) PTCA of Cx; b) CABG   . CAD S/P percutaneous coronary angioplasty 1987    angioplasty and  STENT- Cx; MYOVIEW 1/'05: ? Ischemia vs. artifact --> sent for cath  . S/P CABG x 4 1997    a) LIMA-LAD, SVG to OM, SVG-dRCA-RPL; b) FALSE + MYOVIEW --> CATH 1/'05: 100% LAD after widelly patent D1, all Cx OM branches 100%, pRCA 100%, SVG-PDA patent w/ retrograde filling of RPL, SVG-OM widely patent ~ normal OM, LIMA-LAD patent.  [name] PAF (paroxysmal atrial fibrillation)     Last reported episode 2012; on amiodarone and anticoagulated on warfarin  . Dyslipidemia, goal LDL below 70     Doing better off of Zocor, currently on Lescol   . GERD (gastroesophageal reflux disease)   . DDD (degenerative disc disease)   . Spinal stenosis   .  Barrett's esophagus 03/09/2014  . Personal history of colonic polyps     Past Surgical History  Procedure Laterality Date  . Coronary artery bypass graft  11/1995    LIMA-LAD, SVG to OM, SVG-dRCA-RPL  . Cholecystectomy    . Cardiac catheterization  08/17/2003    "False positive stress test " grafts patent; RCA proximal 100% LAD 100% occlusion after normal D1 with 80% ostial as SP1; circumflex patent but OM1 on to all occluded; EF 50-55%  . Cardiac catheterization  11/1995    Preop CABG: LAD 90% at D1, circumflex 100 and OM a 90% proximal, 80% distal  . Nm myoview ltd  2005    Questionable ischemia that is likely either infarct versus artifact; normal  . Colonoscopy    . Flexible sigmoidoscopy    . Esophagogastroduodenoscopy    . Transthoracic echocardiogram  12/03/2008    LVEF =>55% normal study  . Nm myocar perf ejection fraction  07/03/2003    FALSE POSITIVE TEST; Bruce protocol, negative test with scintigraphic evidence of inferoapical scar, diaphragmatic attenuation, moderate ischemia.  . Carotid duplex doppler  12/23/2009    Right&Left ICAs 0-49%, mildly abnormal study,     There were no vitals filed for this visit.  Visit Diagnosis:  No diagnosis found.      Subjective Assessment - 11/09/14 0937    Subjective No new complaints. No falls or pain to report.   Currently in  Pain? No/denies     Diagnosis codes: Abnormality of gait   .  Ataxia        [facility] Adult PT Treatment/Exercise - 11/09/14 0939    Ambulation/Gait   Ambulation/Gait Yes   Ambulation/Gait Assistance 4: Min guard;5: Supervision   Ambulation Distance (Feet) 660 Feet   Gait details Cues on increase step length, on posture and knee extension with stance.   Assistive device None   Gait Pattern Decreased trunk rotation;Wide base of support;Trunk flexed;Decreased stride length;Step-through pattern;Right flexed knee in stance;Left flexed knee in stance   Ambulation Surface Level;Indoor   Knee/Hip  Exercises: Standing   Heel Raises 1 set;10 reps;5 seconds   Heel Raises Limitations cues to keep knees straight during the hold times with heels lifted   Knee Flexion AAROM;Strengthening;Both;2 sets;10 reps  2# ankle weights   Knee Flexion Limitations cues on posture/hip extension durin gthe hamstring curls   Hip ADduction AAROM;Strengthening;Both;2 sets;10 reps  2# ankle weights   Hip ADduction Limitations cues on posture and ex form/techique   Other Standing Knee Exercises toe raises 5 sec hold x 10 reps with cues on posture and ex form   Other Standing Knee Exercises 2# ankle weights bil legs: alternating hip flexion with knee extension, 2 sets of 10 reps;alternating hip extension with knee extension 2 sets of 10 reps. cues on ex form/technique with both of these.                       Knee/Hip Exercises: Seated   Long Arc Quad Both;Strengthening;2 sets;10 reps;AAROM  5 sec hold   Long Arc Quad Weight 2 lbs.            PT Short Term Goals - 10/19/14 1534    PT SHORT TERM GOAL #1   Title see LTG's  only 4 week POC           PT Long Term Goals - 10/27/14 1102    PT LONG TERM GOAL #1   Title Pt will demonstrate a lowered fall risk evident by scoring >50/56 on BERG balance test. Target date: 11/15/14.   Baseline 43/56   Time 4   Period Weeks   Status On-going   PT LONG TERM GOAL #2   Title Pt will demonstrate compentency with comprehensive HEP to address balance reaction and flexibility deficits. Target date: 11/15/14.   Time 4   Period Weeks   Status On-going   PT LONG TERM GOAL #3   Title Patient will demonstrate an improved gait pattern evident by narrowed base of support and disassociation of head/eye/body mvt while walking using LRAD at MOD I level. Target date: 11/15/14.   Time 4   Period Weeks   Status On-going   PT LONG TERM GOAL #4   Title Pt will ambulate 600' over uneven terrain with LRAD, without LOB, at MOD I level to improve functional mobillity and decrease  falls risk. Target date: 11/15/14.   Status New   PT LONG TERM GOAL #5   Title Perform TUG and write goal if appropriate. Target date: 11/15/14.   Status New   Additional Long Term Goals   Additional Long Term Goals Yes   PT LONG TERM GOAL #6   Title Pt will improve gait speed with LRAD to >/=2.30ft/sec. to ambulate safely in the community. Target date: 11/15/14.   Status New         11/11/14 2117  Plan  Clinical Impression Statement Pt continues  to progress toward goals. Still with weakness in ankles, ant tib and hip muscles affecting gait and balance.  Pt will benefit from skilled therapeutic intervention in order to improve on the following deficits Abnormal gait;Decreased balance;Decreased range of motion;Impaired flexibility;Decreased strength;Decreased mobility  Rehab Potential Good  PT Frequency 2x / week  PT Duration 4 weeks  PT Treatment/Interventions Therapeutic activities;Therapeutic exercise;Balance training;Gait training;Stair training;Neuromuscular re-education;Functional mobility training;Patient/family education;ADLs/Self Care Home Management;Biofeedback;Manual techniques  PT Next Visit Plan Dynamic gait and balance training on compliant surfaces;strengthening of B anterior tib/gastroc  PT Home Exercise Plan Balance and strengthening HEP  Consulted and Agree with Plan of Care Patient    Problem List Patient Active Problem List   Diagnosis Date Noted  . Barrett's esophagus 03/09/2014  . Hypotension due to drugs 03/06/2014  . Emphysema of lung 02/19/2014  . Cough 02/19/2014  . H/O amiodarone therapy 02/16/2013  . S/P CABG x 4   . Persistent atrial fibrillation   . Dyslipidemia, goal LDL below 70   . Personal history of colonic polyps 09/10/2011  . UC (ulcerative colitis) 09/10/2011  . Anticoagulant long-term use 09/10/2011  . VITAMIN B12 DEFICIENCY 01/30/2009  . Type 2 diabetes mellitus with complication - CAD 78/29/5621  . GOUT 10/31/2007  . Essential  hypertension 10/31/2007  . ACID REFLUX DISEASE 10/31/2007  . CAD S/P PTCA-PCI then CABG x4 12/01/1995  . ST elevation myocardial infarction (STEMI) of inferior wall, subsequent episode of care 10/30/1985    Willow Ora 11/09/2014, 10:12 AM  Willow Ora, PTA, [facility] [address], [place], [place] [address] [phone] 11/09/2014, 10:12 AM

## 2014-11-12 ENCOUNTER — Ambulatory Visit: Payer: Medicare Other | Admitting: Physical Therapy

## 2014-11-12 ENCOUNTER — Encounter: Payer: Self-pay | Admitting: Physical Therapy

## 2014-11-12 DIAGNOSIS — R269 Unspecified abnormalities of gait and mobility: Secondary | ICD-10-CM

## 2014-11-12 DIAGNOSIS — R270 Ataxia, unspecified: Secondary | ICD-10-CM

## 2014-11-12 NOTE — Therapy (Signed)
[facility] [address] [place], [place], [address] Phone: [phone]   Fax:  [phone]  Physical Therapy Treatment  Patient Details  Name: Timothy Mclean MRN: [id number] Date of Birth: 1941/07/15 Referring Provider:  Derinda Late, MD  Encounter Date: 11/12/2014      PT End of Session - 11/12/14 0935    Visit Number 8  last visit should have been number 30, corrected today   Number of Visits 9   Date for PT Re-Evaluation 11/23/14   Authorization Type medicare g-code needed   PT Start Time 0933   PT Stop Time 1015   PT Time Calculation (min) 42 min   Equipment Utilized During Treatment Gait belt   Activity Tolerance Patient tolerated treatment well   Behavior During Therapy [facility] for tasks assessed/performed      Past Medical History  Diagnosis Date  . Gout, unspecified   . Iron deficiency anemia, unspecified   . Other and unspecified hyperlipidemia   . Esophageal reflux   . Unspecified essential hypertension   . Type II or unspecified type diabetes mellitus without mention of complication, not stated as uncontrolled   . Ulcerative colitis, unspecified   . Other B-complex deficiencies   . ST elevation myocardial infarction (STEMI) of inferior wall, subsequent episode of care [place]    a) PTCA of Cx; b) CABG   . CAD S/P percutaneous coronary angioplasty 1987    angioplasty and  STENT- Cx; MYOVIEW 1/'05: ? Ischemia vs. artifact --> sent for cath  . S/P CABG x 4 1997    a) LIMA-LAD, SVG to OM, SVG-dRCA-RPL; b) FALSE + MYOVIEW --> CATH 1/'05: 100% LAD after widelly patent D1, all Cx OM branches 100%, pRCA 100%, SVG-PDA patent w/ retrograde filling of RPL, SVG-OM widely patent ~ normal OM, LIMA-LAD patent.  [name] PAF (paroxysmal atrial fibrillation)     Last reported episode 2012; on amiodarone and anticoagulated on warfarin  . Dyslipidemia, goal LDL below 70     Doing better off of Zocor, currently on Lescol    . GERD (gastroesophageal reflux disease)   . DDD (degenerative disc disease)   . Spinal stenosis   . Barrett's esophagus 03/09/2014  . Personal history of colonic polyps     Past Surgical History  Procedure Laterality Date  . Coronary artery bypass graft  11/1995    LIMA-LAD, SVG to OM, SVG-dRCA-RPL  . Cholecystectomy    . Cardiac catheterization  08/17/2003    "False positive stress test " grafts patent; RCA proximal 100% LAD 100% occlusion after normal D1 with 80% ostial as SP1; circumflex patent but OM1 on to all occluded; EF 50-55%  . Cardiac catheterization  11/1995    Preop CABG: LAD 90% at D1, circumflex 100 and OM a 90% proximal, 80% distal  . Nm myoview ltd  2005    Questionable ischemia that is likely either infarct versus artifact; normal  . Colonoscopy    . Flexible sigmoidoscopy    . Esophagogastroduodenoscopy    . Transthoracic echocardiogram  12/03/2008    LVEF =>55% normal study  . Nm myocar perf ejection fraction  07/03/2003    FALSE POSITIVE TEST; Bruce protocol, negative test with scintigraphic evidence of inferoapical scar, diaphragmatic attenuation, moderate ischemia.  . Carotid duplex doppler  12/23/2009    Right&Left ICAs 0-49%, mildly abnormal study,     There were no vitals filed for this visit.  Visit Diagnosis:  Abnormality of gait  Ataxia  Subjective Assessment - 11/12/14 0934    Subjective No new complaints. No falls or pain to report.   Currently in Pain? No/denies   Multiple Pain Sites No           [facility] Adult PT Treatment/Exercise - 11/12/14 0936    Ambulation/Gait   Ambulation/Gait Yes   Ambulation/Gait Assistance 5: Supervision   Ambulation/Gait Assistance Details cues on posture and for heel strike with gait   Ambulation Distance (Feet) 450 Feet   Assistive device None   Gait Pattern Decreased trunk rotation;Wide base of support;Trunk flexed;Decreased stride length;Step-through pattern;Right flexed knee in stance;Left flexed  knee in stance;Right foot flat;Left foot flat;Decreased dorsiflexion - right;Decreased dorsiflexion - left   Ambulation Surface Level;Indoor   High Level Balance   High Level Balance Activities Negotiating over obstacles;Tandem walking;Marching forwards;Marching backwards  tandem/heel/toe walk forward/backward   High Level Balance Comments stepping over 4 bolsters of vaired heights without a device, with min guard assist and cues on technique. on red mats: high knee marching forward/backwards, tandem gait forward/backward and heel walk/toe walk forward backward. all 3 laps each/each way with intermittent UE support on counter top. min guard assist to min assist for balance with these activities.                                      Knee/Hip Exercises: Aerobic   Stationary Bike Scifit x 4 extremities level 4.0 x 8 minutes with goal rpm >/= 70 for stengthening and activity tolerance.   Knee/Hip Exercises: Standing   Heel Raises 1 set;10 reps;5 seconds  2# ankle weights on   Heel Raises Limitations cues to keep knees straight during the hold times with heels lifted   Knee Flexion AROM;Strengthening;Both;1 set;10 reps  2# ankle weights   Knee Flexion Limitations cues on posture/hip extension during the hamstring curls   Hip ADduction AROM;Strengthening;Both;1 set;10 reps  2# ankle weights   Hip ADduction Limitations cues on posture and ex form/techique   Other Standing Knee Exercises toe raises with 2# ankle weights 5 sec hold x 10 reps with cues on posture and ex form   Other Standing Knee Exercises alternating hip extension with 2# ankle weights x 10 each side. cues on ex form and technique.                                      PT Short Term Goals - 10/19/14 1534    PT SHORT TERM GOAL #1   Title see LTG's  only 4 week POC           PT Long Term Goals - 10/27/14 1102    PT LONG TERM GOAL #1   Title Pt will demonstrate a lowered fall risk evident by scoring >50/56 on BERG balance  test. Target date: 11/15/14.   Baseline 43/56   Time 4   Period Weeks   Status On-going   PT LONG TERM GOAL #2   Title Pt will demonstrate compentency with comprehensive HEP to address balance reaction and flexibility deficits. Target date: 11/15/14.   Time 4   Period Weeks   Status On-going   PT LONG TERM GOAL #3   Title Patient will demonstrate an improved gait pattern evident by narrowed base of support and disassociation of head/eye/body mvt while walking using LRAD at MOD I  level. Target date: 11/15/14.   Time 4   Period Weeks   Status On-going   PT LONG TERM GOAL #4   Title Pt will ambulate 600' over uneven terrain with LRAD, without LOB, at MOD I level to improve functional mobillity and decrease falls risk. Target date: 11/15/14.   Status New   PT LONG TERM GOAL #5   Title Perform TUG and write goal if appropriate. Target date: 11/15/14.   Status New   Additional Long Term Goals   Additional Long Term Goals Yes   PT LONG TERM GOAL #6   Title Pt will improve gait speed with LRAD to >/=2.86f/sec. to ambulate safely in the community. Target date: 11/15/14.   Status New           Plan - 11/12/14 0935    Clinical Impression Statement Pt making steady progress toward goals. Continues to be challenged with high level balance activities and strengthening exericses in therapy.   Pt will benefit from skilled therapeutic intervention in order to improve on the following deficits Abnormal gait;Decreased balance;Decreased range of motion;Impaired flexibility;Decreased strength;Decreased mobility   Rehab Potential Good   PT Frequency 2x / week   PT Duration 4 weeks   PT Treatment/Interventions Therapeutic activities;Therapeutic exercise;Balance training;Gait training;Stair training;Neuromuscular re-education;Functional mobility training;Patient/family education;ADLs/Self Care Home Management;Biofeedback;Manual techniques   PT Next Visit Plan Dynamic gait and balance training on compliant  surfaces;strengthening of B anterior tib/gastroc   PT Home Exercise Plan Balance and strengthening HEP   Consulted and Agree with Plan of Care Patient        Problem List Patient Active Problem List   Diagnosis Date Noted  . Barrett's esophagus 03/09/2014  . Hypotension due to drugs 03/06/2014  . Emphysema of lung 02/19/2014  . Cough 02/19/2014  . H/O amiodarone therapy 02/16/2013  . S/P CABG x 4   . Persistent atrial fibrillation   . Dyslipidemia, goal LDL below 70   . Personal history of colonic polyps 09/10/2011  . UC (ulcerative colitis) 09/10/2011  . Anticoagulant long-term use 09/10/2011  . VITAMIN B12 DEFICIENCY 01/30/2009  . Type 2 diabetes mellitus with complication - CAD 001/58/6825 . GOUT 10/31/2007  . Essential hypertension 10/31/2007  . ACID REFLUX DISEASE 10/31/2007  . CAD S/P PTCA-PCI then CABG x4 12/01/1995  . ST elevation myocardial infarction (STEMI) of inferior wall, subsequent episode of care 10/30/1985    BWillow Ora4/25/2016, 11:50 AM  KWillow Ora PTA, [place] [place]red[place]GCam[place] [phone]/25/2016, 11:50 AM

## 2014-11-15 ENCOUNTER — Ambulatory Visit: Payer: Medicare Other | Admitting: Physical Therapy

## 2014-11-15 ENCOUNTER — Ambulatory Visit (INDEPENDENT_AMBULATORY_CARE_PROVIDER_SITE_OTHER): Payer: Medicare Other | Admitting: Pharmacist Clinician (PhC)/ Clinical Pharmacy Specialist

## 2014-11-15 ENCOUNTER — Encounter: Payer: Self-pay | Admitting: Physical Therapy

## 2014-11-15 DIAGNOSIS — R269 Unspecified abnormalities of gait and mobility: Secondary | ICD-10-CM

## 2014-11-15 DIAGNOSIS — Z7901 Long term (current) use of anticoagulants: Secondary | ICD-10-CM

## 2014-11-15 DIAGNOSIS — R270 Ataxia, unspecified: Secondary | ICD-10-CM

## 2014-11-15 LAB — POCT INR: INR: 1.2

## 2014-11-15 NOTE — Therapy (Signed)
[facility] [address] [place], [place], [address] Phone: [phone]   Fax:  [phone]  Physical Therapy Treatment  Patient Details  Name: TYRIE PORZIO MRN: [id number] Date of Birth: Jul 21, 1940 Referring Provider:  Derinda Late, MD  Encounter Date: 11/15/2014      PT End of Session - 11/15/14 0936    Visit Number 9  l   Number of Visits 9   Date for PT Re-Evaluation 12/15/14   Authorization Type medicare g-code needed   PT Start Time 0932   PT Stop Time 1011   PT Time Calculation (min) 39 min   Equipment Utilized During Treatment Gait belt   Activity Tolerance Patient tolerated treatment well   Behavior During Therapy [facility] [place] [facility] for tasks assessed/performed      Past Medical History  Diagnosis Date  . Gout, unspecified   . Iron deficiency anemia, unspecified   . Other and unspecified hyperlipidemia   . Esophageal reflux   . Unspecified essential hypertension   . Type II or unspecified type diabetes mellitus without mention of complication, not stated as uncontrolled   . Ulcerative colitis, unspecified   . Other B-complex deficiencies   . ST elevation myocardial infarction (STEMI) of inferior wall, subsequent episode of care [place]    a) PTCA of Cx; b) CABG   . CAD S/P percutaneous coronary angioplasty 1987    angioplasty and  STENT- Cx; MYOVIEW 1/'05: ? Ischemia vs. artifact --> sent for cath  . S/P CABG x 4 1997    a) LIMA-LAD, SVG to OM, SVG-dRCA-RPL; b) FALSE + MYOVIEW --> CATH 1/'05: 100% LAD after widelly patent D1, all Cx OM branches 100%, pRCA 100%, SVG-PDA patent w/ retrograde filling of RPL, SVG-OM widely patent ~ normal OM, LIMA-LAD patent.  [name] PAF (paroxysmal atrial fibrillation)     Last reported episode 2012; on amiodarone and anticoagulated on warfarin  . Dyslipidemia, goal LDL below 70     Doing better off of Zocor, currently on Lescol   . GERD (gastroesophageal reflux disease)   . DDD  (degenerative disc disease)   . Spinal stenosis   . Barrett's esophagus 03/09/2014  . Personal history of colonic polyps     Past Surgical History  Procedure Laterality Date  . Coronary artery bypass graft  11/1995    LIMA-LAD, SVG to OM, SVG-dRCA-RPL  . Cholecystectomy    . Cardiac catheterization  08/17/2003    "False positive stress test " grafts patent; RCA proximal 100% LAD 100% occlusion after normal D1 with 80% ostial as SP1; circumflex patent but OM1 on to all occluded; EF 50-55%  . Cardiac catheterization  11/1995    Preop CABG: LAD 90% at D1, circumflex 100 and OM a 90% proximal, 80% distal  . Nm myoview ltd  2005    Questionable ischemia that is likely either infarct versus artifact; normal  . Colonoscopy    . Flexible sigmoidoscopy    . Esophagogastroduodenoscopy    . Transthoracic echocardiogram  12/03/2008    LVEF =>55% normal study  . Nm myocar perf ejection fraction  07/03/2003    FALSE POSITIVE TEST; Bruce protocol, negative test with scintigraphic evidence of inferoapical scar, diaphragmatic attenuation, moderate ischemia.  . Carotid duplex doppler  12/23/2009    Right&Left ICAs 0-49%, mildly abnormal study,     There were no vitals filed for this visit.  Visit Diagnosis:  Abnormality of gait - Plan: PT plan of care cert/re-cert  Ataxia - Plan:  PT plan of care cert/re-cert      Subjective Assessment - 11/15/14 0936    Subjective No new complaints. No falls or pain to report.   Currently in Pain? No/denies            [facility] Adult PT Treatment/Exercise - 11/15/14 0938    Ambulation/Gait   Ambulation/Gait Yes   Ambulation/Gait Assistance 4: Min guard;5: Supervision   Ambulation/Gait Assistance Details decreased right knee stability noted on uneven outdoor surfaces compared to level indoor surfaces.                     Ambulation Distance (Feet) 720 Feet   Assistive device Straight cane   Gait Pattern Step-through pattern;Decreased stride length;Right  flexed knee in stance;Trunk flexed;Poor foot clearance - left;Poor foot clearance - right   Ambulation Surface Level;Unlevel;Indoor;Outdoor;Paved   Gait velocity 13.53 sec's= 2.43 ft/sec with cane   Berg Balance Test   Sit to Stand Able to stand without using hands and stabilize independently   Standing Unsupported Able to stand safely 2 minutes   Sitting with Back Unsupported but Feet Supported on Floor or Stool Able to sit safely and securely 2 minutes   Stand to Sit Sits safely with minimal use of hands   Transfers Able to transfer safely, definite need of hands   Standing Unsupported with Eyes Closed Able to stand 10 seconds with supervision   Standing Ubsupported with Feet Together Able to place feet together independently and stand for 1 minute with supervision   From Standing, Reach Forward with Outstretched Arm Can reach confidently >25 cm (10")   From Standing Position, Pick up Object from [place] to pick up shoe safely and easily   From Standing Position, Turn to Look Behind Over each Shoulder Looks behind one side only/other side shows less weight shift   Turn 360 Degrees Able to turn 360 degrees safely but slowly  4.94 to left, 4.38 to right   Standing Unsupported, Alternately Place Feet on Step/Stool Able to stand independently and safely and complete 8 steps in 20 seconds  17.28 sec's   Standing Unsupported, One Foot in Front Able to take small step independently and hold 30 seconds   Standing on One Leg Tries to lift leg/unable to hold 3 seconds but remains standing independently   Total Score 45   Timed Up and Go Test   TUG Normal TUG   Normal TUG (seconds) 10.19  with cane; 11.30 without device           PT Short Term Goals - 10/19/14 1534    PT SHORT TERM GOAL #1   Title see LTG's  only 4 week POC           PT Long Term Goals - 11/15/14 1954    PT LONG TERM GOAL #1   Title Pt will demonstrate a lowered fall risk evident by scoring >50/56 on BERG balance  test. Target date: 11/15/14. New target date: 12/14/14.   Baseline 11/15/14: scored 45/56 today. All unmet goals will be carried over to new POC: 12/15/14.   Status Not Met   PT LONG TERM GOAL #2   Title Pt will demonstrate compentency with comprehensive HEP to address balance reaction and flexibility deficits. Target date: 11/15/14.   Baseline 11/15/14: pt independent with current HEP.   Status Achieved   PT LONG TERM GOAL #3   Title Patient will demonstrate an improved gait pattern evident by narrowed base of support and  disassociation of head/eye/body mvt while walking using LRAD at MOD I level. Target date: 11/15/14. New target date: 12/14/14.   Baseline 11/15/14: pt with more narrow base of support with use of cane for gait, still with some lower extremity weakness/instability noted with gait causing him to be supervison to min guard with gait   Status Partially Met   PT LONG TERM GOAL #4   Title Pt will ambulate 600' over uneven terrain with LRAD, without LOB, at MOD I level to improve functional mobillity and decrease falls risk. Target date: 11/15/14. New target date: 12/14/14.   Baseline 11/15/14: pt able to ambulate this distance, however still needs occasional assistance for balance.   Status Partially Met   PT LONG TERM GOAL #5   Title Pt will perform TUG in <13.5sec. to decrease falls risk. Target date: 12/15/14.   Baseline 11/15/14: TUG assessed previously, however no goal set. Ressessed today with minimal progress made with cane support and increased time without AD compared to previous assessment.   Status Revised   PT LONG TERM GOAL #6   Title Pt will improve gait speed with LRAD to >/=2.23f/sec. to ambulate safely in the community. Target date: 11/15/14. New target date: 12/14/14.   Baseline 11/15/14: pt ambulating 2.43 ft/sec with cane with supervision.   Status Not Met           Plan - 11/15/14 0936    Clinical Impression Statement Pt is making progress toward goals, however has not  met all goals. Test scores improved today. Goal status discussed with JDelrae Sawyers, PT as primary PT not in clinic today. Will renew for 4 weeks to work toward LTG's.   Pt will benefit from skilled therapeutic intervention in order to improve on the following deficits Abnormal gait;Decreased balance;Decreased range of motion;Impaired flexibility;Decreased strength;Decreased mobility   Rehab Potential Good   PT Frequency 2x / week   PT Duration 4 weeks   PT Treatment/Interventions Therapeutic activities;Therapeutic exercise;Balance training;Gait training;Stair training;Neuromuscular re-education;Functional mobility training;Patient/family education;ADLs/Self Care Home Management;Biofeedback;Manual techniques   PT Next Visit Plan PT to renew and continue toward goals. Dynamic gait and balance training on compliant surfaces; strengthening of bil anteriror tib/gastroc.   PT Home Exercise Plan Balance and strengthening HEP   Consulted and Agree with Plan of Care Patient        Problem List Patient Active Problem List   Diagnosis Date Noted  . Barrett's esophagus 03/09/2014  . Hypotension due to drugs 03/06/2014  . Emphysema of lung 02/19/2014  . Cough 02/19/2014  . H/O amiodarone therapy 02/16/2013  . S/P CABG x 4   . Persistent atrial fibrillation   . Dyslipidemia, goal LDL below 70   . Personal history of colonic polyps 09/10/2011  . UC (ulcerative colitis) 09/10/2011  . Anticoagulant long-term use 09/10/2011  . VITAMIN B12 DEFICIENCY 01/30/2009  . Type 2 diabetes mellitus with complication - CAD 002/63/7858 . GOUT 10/31/2007  . Essential hypertension 10/31/2007  . ACID REFLUX DISEASE 10/31/2007  . CAD S/P PTCA-PCI then CABG x4 12/01/1995  . ST elevation myocardial infarction (STEMI) of inferior wall, subsequent episode of care 10/30/1985    Miller,Jennifer L 11/16/2014, 3:42 PM  KWillow Ora PTA, CSurgery Center At Liberty [facility] LLCOutpatient Neuro RCharl[facility] Surgery [facility] Limited Partnership[address] SB[address] RiverGTrin[address] [place]  [phone]/29/2016, 3:42 PM    Renewal completed by PT. JGeoffry Paradise PT,DPT 11/16/2014 3:43 PM Phone: [phone]: [phone]

## 2014-11-16 NOTE — Addendum Note (Signed)
Addended by: Elza Rafter on: 11/16/2014 03:43 PM   Modules accepted: Orders

## 2014-11-28 ENCOUNTER — Encounter: Payer: Self-pay | Admitting: Physical Therapy

## 2014-11-28 ENCOUNTER — Ambulatory Visit: Payer: Medicare Other | Attending: Neurology | Admitting: Physical Therapy

## 2014-11-28 DIAGNOSIS — R269 Unspecified abnormalities of gait and mobility: Secondary | ICD-10-CM | POA: Insufficient documentation

## 2014-11-28 DIAGNOSIS — R270 Ataxia, unspecified: Secondary | ICD-10-CM

## 2014-11-28 NOTE — Therapy (Signed)
[facility] [address] [place], [place], [address] Phone: [phone]   Fax:  [phone]  Physical Therapy Treatment  Patient Details  Name: Timothy Mclean MRN: [id number] Date of Birth: [birth year] Referring Provider:  Derinda Late, MD  Encounter Date: 11/28/2014      PT End of Session - 11/28/14 0939    Visit Number 10  [id number]   Number of Visits 9   Date for PT Re-Evaluation 11/23/14   Authorization Type medicare g-code needed   PT Start Time 0936   PT Stop Time 1015   PT Time Calculation (min) 39 min   Equipment Utilized During Treatment Gait belt   Activity Tolerance Patient tolerated treatment well   Behavior During Therapy [facility] for tasks assessed/performed      Past Medical History  Diagnosis Date  . Gout, unspecified   . Iron deficiency anemia, unspecified   . Other and unspecified hyperlipidemia   . Esophageal reflux   . Unspecified essential hypertension   . Type II or unspecified type diabetes mellitus without mention of complication, not stated as uncontrolled   . Ulcerative colitis, unspecified   . Other B-complex deficiencies   . ST elevation myocardial infarction (STEMI) of inferior wall, subsequent episode of care [place]    a) PTCA of Cx; b) CABG   . CAD S/P percutaneous coronary angioplasty 1987    angioplasty and  STENT- Cx; MYOVIEW 1/'05: ? Ischemia vs. artifact --> sent for cath  . S/P CABG x 4 1997    a) LIMA-LAD, SVG to OM, SVG-dRCA-RPL; b) FALSE + MYOVIEW --> CATH 1/'05: 100% LAD after widelly patent D1, all Cx OM branches 100%, pRCA 100%, SVG-PDA patent w/ retrograde filling of RPL, SVG-OM widely patent ~ normal OM, LIMA-LAD patent.  [name] PAF (paroxysmal atrial fibrillation)     Last reported episode 2012; on amiodarone and anticoagulated on warfarin  . Dyslipidemia, goal LDL below 70     Doing better off of Zocor, currently on Lescol   . GERD (gastroesophageal reflux disease)   .  DDD (degenerative disc disease)   . Spinal stenosis   . Barrett's esophagus 03/09/2014  . Personal history of colonic polyps     Past Surgical History  Procedure Laterality Date  . Coronary artery bypass graft  11/1995    LIMA-LAD, SVG to OM, SVG-dRCA-RPL  . Cholecystectomy    . Cardiac catheterization  08/17/2003    "False positive stress test " grafts patent; RCA proximal 100% LAD 100% occlusion after normal D1 with 80% ostial as SP1; circumflex patent but OM1 on to all occluded; EF 50-55%  . Cardiac catheterization  11/1995    Preop CABG: LAD 90% at D1, circumflex 100 and OM a 90% proximal, 80% distal  . Nm myoview ltd  2005    Questionable ischemia that is likely either infarct versus artifact; normal  . Colonoscopy    . Flexible sigmoidoscopy    . Esophagogastroduodenoscopy    . Transthoracic echocardiogram  12/03/2008    LVEF =>55% normal study  . Nm myocar perf ejection fraction  07/03/2003    FALSE POSITIVE TEST; Bruce protocol, negative test with scintigraphic evidence of inferoapical scar, diaphragmatic attenuation, moderate ischemia.  . Carotid duplex doppler  12/23/2009    Right&Left ICAs 0-49%, mildly abnormal study,     There were no vitals filed for this visit.  Visit Diagnosis:  Abnormality of gait  Ataxia      Subjective Assessment - 11/28/14  5284    Subjective No new complaints. No falls or pain to report.   Currently in Pain? No/denies          [facility] Adult PT Treatment/Exercise - 11/28/14 0948    Ambulation/Gait   Ambulation/Gait Yes   Ambulation/Gait Assistance 4: Min guard;5: Supervision   Ambulation Distance (Feet) 660 Feet   Assistive device None   Gait Pattern Step-through pattern;Decreased stride length;Right flexed knee in stance;Trunk flexed;Poor foot clearance - left;Poor foot clearance - right   Ambulation Surface Level;Indoor   Berg Balance Test   Sit to Stand Able to stand without using hands and stabilize independently   Standing  Unsupported Able to stand safely 2 minutes   Sitting with Back Unsupported but Feet Supported on Floor or Stool Able to sit safely and securely 2 minutes   Stand to Sit Sits safely with minimal use of hands   Transfers Able to transfer safely, minor use of hands   Standing Unsupported with Eyes Closed Able to stand 10 seconds safely   Standing Ubsupported with Feet Together Able to place feet together independently and stand for 1 minute with supervision   From Standing, Reach Forward with Outstretched Arm Can reach confidently >25 cm (10")   From Standing Position, Pick up Object from Floor Able to pick up shoe safely and easily   From Standing Position, Turn to Look Behind Over each Shoulder Looks behind one side only/other side shows less weight shift   Turn 360 Degrees Able to turn 360 degrees safely but slowly  > 5 sec's each way   Standing Unsupported, Alternately Place Feet on Step/Stool Able to stand independently and safely and complete 8 steps in 20 seconds   Standing Unsupported, One Foot in Front Able to plae foot ahead of the other independently and hold 30 seconds   Standing on One Leg Tries to lift leg/unable to hold 3 seconds but remains standing independently   Total Score 48   Knee/Hip Exercises: Aerobic   Stationary Bike Scifit x 4 extremities level 4.0 x 8 minutes with goal rpm >/= 70 for stengthening and activity tolerance.   Knee/Hip Exercises: Standing   Heel Raises 10 reps;5 seconds;2 sets   Heel Raises Limitations cues to keep knees straight during the hold times with heels lifted   Knee Flexion AROM;Strengthening;Both;2 sets;10 reps  2# ankle weights   Knee Flexion Limitations cues on posture/hip extension during the hamstring curls   Hip ADduction AROM;Strengthening;Both;2 sets;10 reps  2# ankle weights   Hip ADduction Limitations cues on posture and ex form/techique   Other Standing Knee Exercises toe raises with 2# ankle weights 5 sec hold 2 sets  x 10 reps with  cues on posture and ex form   Other Standing Knee Exercises alternating hip extension with 2# ankle weights 2 sets x 10 each side. cues on ex form and technique.                                      PT Short Term Goals - 10/19/14 1534    PT SHORT TERM GOAL #1   Title see LTG's  only 4 week POC           PT Long Term Goals - 11/28/14 2007    PT LONG TERM GOAL #1   Title Pt will demonstrate a lowered fall risk evident by scoring >50/56 on BERG balance test.  Target date: 11/15/14. New target date: 12/14/14.   Baseline 11/15/14: scored 45/56 today. All unmet goals will be carried over to new POC: 12/15/14.   Status On-going   PT LONG TERM GOAL #2   Title Pt will demonstrate compentency with comprehensive HEP to address balance reaction and flexibility deficits. Target date: 11/15/14.   Baseline 11/15/14: pt independent with current HEP.   Status Achieved   PT LONG TERM GOAL #3   Title Patient will demonstrate an improved gait pattern evident by narrowed base of support and disassociation of head/eye/body mvt while walking using LRAD at MOD I level. Target date: 11/15/14. New target date: 12/14/14.   Status On-going   PT LONG TERM GOAL #4   Title Pt will ambulate 600' over uneven terrain with LRAD, without LOB, at MOD I level to improve functional mobillity and decrease falls risk. Target date: 11/15/14. New target date: 12/14/14.   Status On-going   PT LONG TERM GOAL #5   Title Pt will perform TUG in <13.5sec. to decrease falls risk. Target date: 12/15/14.   Status On-going   PT LONG TERM GOAL #6   Title Pt will improve gait speed with LRAD to >/=2.89ft/sec. to ambulate safely in the community. Target date: 11/15/14. New target date: 12/14/14.   Status On-going           Plan - 11-Dec-2014 0939    Clinical Impression Statement Emphasized strengthening today with no issues noted or reported by patient. Pt progressing toward goals.   Pt will benefit from skilled therapeutic intervention in  order to improve on the following deficits Abnormal gait;Decreased balance;Decreased range of motion;Impaired flexibility;Decreased strength;Decreased mobility   Rehab Potential Good   PT Frequency 2x / week   PT Duration 4 weeks   PT Treatment/Interventions Therapeutic activities;Therapeutic exercise;Balance training;Gait training;Stair training;Neuromuscular re-education;Functional mobility training;Patient/family education;ADLs/Self Care Home Management;Biofeedback;Manual techniques   PT Next Visit Plan  Dynamic gait and balance training on compliant surfaces; strengthening of bil anteriror tib/gastroc.   PT Home Exercise Plan Balance and strengthening HEP   Consulted and Agree with Plan of Care Patient          G-Codes - 12-11-14 10-31-23    Functional Assessment Tool Used BERG: 48/56   Functional Limitation Mobility: Walking and moving around   Mobility: Walking and Moving Around Current Status [phone]) At least 20 percent but less than 40 percent impaired, limited or restricted   Mobility: Walking and Moving Around Goal Status [phone]) At least 1 percent but less than 20 percent impaired, limited or restricted      Problem List Patient Active Problem List   Diagnosis Date Noted  . Barrett's esophagus 03/09/2014  . Hypotension due to drugs 03/06/2014  . Emphysema of lung 02/19/2014  . Cough 02/19/2014  . H/O amiodarone therapy 02/16/2013  . S/P CABG x 4   . Persistent atrial fibrillation   . Dyslipidemia, goal LDL below 70   . Personal history of colonic polyps 09/10/2011  . UC (ulcerative colitis) 09/10/2011  . Anticoagulant long-term use 09/10/2011  . VITAMIN B12 DEFICIENCY 01/30/2009  . Type 2 diabetes mellitus with complication - CAD 73/41/9379  . GOUT 10/31/2007  . Essential hypertension 10/31/2007  . ACID REFLUX DISEASE 10/31/2007  . CAD S/P PTCA-PCI then CABG x4 12/01/1995  . ST elevation myocardial infarction (STEMI) of inferior wall, subsequent episode of care  10/30/1985   G-code completed by PT. Geoffry Paradise, PT,DPT 12/11/14 8:26 PM Phone: [phone] Fax: [place]  11/28/2014, 8:26 PM  Willow Ora, PTA, [facility] [address] [place], [place] [address] [phone] 11/28/2014, 8:26 PM

## 2014-11-30 ENCOUNTER — Ambulatory Visit: Payer: Medicare Other | Admitting: Physical Therapy

## 2014-11-30 ENCOUNTER — Encounter: Payer: Self-pay | Admitting: Physical Therapy

## 2014-11-30 ENCOUNTER — Ambulatory Visit (INDEPENDENT_AMBULATORY_CARE_PROVIDER_SITE_OTHER): Payer: Medicare Other | Admitting: Pharmacist Clinician (PhC)/ Clinical Pharmacy Specialist

## 2014-11-30 DIAGNOSIS — Z7901 Long term (current) use of anticoagulants: Secondary | ICD-10-CM | POA: Diagnosis not present

## 2014-11-30 DIAGNOSIS — R269 Unspecified abnormalities of gait and mobility: Secondary | ICD-10-CM | POA: Diagnosis not present

## 2014-11-30 DIAGNOSIS — R270 Ataxia, unspecified: Secondary | ICD-10-CM

## 2014-11-30 DIAGNOSIS — I4891 Unspecified atrial fibrillation: Secondary | ICD-10-CM | POA: Diagnosis not present

## 2014-11-30 LAB — POCT INR: INR: 2.2

## 2014-11-30 NOTE — Therapy (Signed)
[facility] [address] [place], [place], [address] Phone: [phone]   Fax:  [phone]  Physical Therapy Treatment  Patient Details  Name: Timothy Mclean MRN: [id number] Date of Birth: 1941-06-03 Referring Provider:  Derinda Late, MD  Encounter Date: 11/30/2014      PT End of Session - 11/30/14 0936    Visit Number 11  g11   Number of Visits 9   Date for PT Re-Evaluation 11/23/14   Authorization Type medicare g-code needed   PT Start Time 0933   PT Stop Time 1015   PT Time Calculation (min) 42 min   Equipment Utilized During Treatment Gait belt   Activity Tolerance Patient tolerated treatment well   Behavior During Therapy [facility] for tasks assessed/performed      Past Medical History  Diagnosis Date  . Gout, unspecified   . Iron deficiency anemia, unspecified   . Other and unspecified hyperlipidemia   . Esophageal reflux   . Unspecified essential hypertension   . Type II or unspecified type diabetes mellitus without mention of complication, not stated as uncontrolled   . Ulcerative colitis, unspecified   . Other B-complex deficiencies   . ST elevation myocardial infarction (STEMI) of inferior wall, subsequent episode of care [place]    a) PTCA of Cx; b) CABG   . CAD S/P percutaneous coronary angioplasty 1987    angioplasty and  STENT- Cx; MYOVIEW 1/'05: ? Ischemia vs. artifact --> sent for cath  . S/P CABG x 4 1997    a) LIMA-LAD, SVG to OM, SVG-dRCA-RPL; b) FALSE + MYOVIEW --> CATH 1/'05: 100% LAD after widelly patent D1, all Cx OM branches 100%, pRCA 100%, SVG-PDA patent w/ retrograde filling of RPL, SVG-OM widely patent ~ normal OM, LIMA-LAD patent.  [name] PAF (paroxysmal atrial fibrillation)     Last reported episode 2012; on amiodarone and anticoagulated on warfarin  . Dyslipidemia, goal LDL below 70     Doing better off of Zocor, currently on Lescol   . GERD (gastroesophageal reflux disease)   .  DDD (degenerative disc disease)   . Spinal stenosis   . Barrett's esophagus 03/09/2014  . Personal history of colonic polyps     Past Surgical History  Procedure Laterality Date  . Coronary artery bypass graft  11/1995    LIMA-LAD, SVG to OM, SVG-dRCA-RPL  . Cholecystectomy    . Cardiac catheterization  08/17/2003    "False positive stress test " grafts patent; RCA proximal 100% LAD 100% occlusion after normal D1 with 80% ostial as SP1; circumflex patent but OM1 on to all occluded; EF 50-55%  . Cardiac catheterization  11/1995    Preop CABG: LAD 90% at D1, circumflex 100 and OM a 90% proximal, 80% distal  . Nm myoview ltd  2005    Questionable ischemia that is likely either infarct versus artifact; normal  . Colonoscopy    . Flexible sigmoidoscopy    . Esophagogastroduodenoscopy    . Transthoracic echocardiogram  12/03/2008    LVEF =>55% normal study  . Nm myocar perf ejection fraction  07/03/2003    FALSE POSITIVE TEST; Bruce protocol, negative test with scintigraphic evidence of inferoapical scar, diaphragmatic attenuation, moderate ischemia.  . Carotid duplex doppler  12/23/2009    Right&Left ICAs 0-49%, mildly abnormal study,     There were no vitals filed for this visit.  Visit Diagnosis:  Abnormality of gait  Ataxia      Subjective Assessment - 11/30/14  0935    Subjective No new complaints. No falls or pain to report.   Currently in Pain? No/denies          [facility] Adult PT Treatment/Exercise - 11/30/14 0936    Ambulation/Gait   Ambulation/Gait Yes   Ambulation/Gait Assistance 4: Min guard   Ambulation/Gait Assistance Details cues on posture and for equal step length. walked a faster pace today (therapist setting pace). Pt reported increased fatigue with walking faster pace.   Ambulation Distance (Feet) 660 Feet   Assistive device None   Gait Pattern Step-through pattern;Decreased stride length;Right flexed knee in stance;Trunk flexed;Poor foot clearance - left;Poor  foot clearance - right   Ambulation Surface Level;Indoor   Dynamic Standing Balance   Dynamic Standing - Balance Support During functional activity;Bilateral upper extremity supported  emphasized light UE support on bars   Dynamic Standing - Level of Assistance 5: Stand by assistance   Dynamic Standing - Balance Activities Foam balance beam   Dynamic Standing - Comments on blue foam beam: side stepping x 3 laps each way, tandem walking fwd/bwd x 3 laps each way; standing with feet across foam beam: alternating forward heel taps and backward toe taps x 10 reps each bil legs.cues on posture and ex form.                         High Level Balance   High Level Balance Activities Tandem walking;Marching backwards;Marching forwards  tandem fwd/bwd, heel/toe walk fwd/bwd   High Level Balance Comments on red mats without UE support x 3 laps each with min to mod assist for balance and cues on correct ex form/technique.                          Knee/Hip Exercises: Aerobic   Stationary Bike Scifit x 4 extremities level 4.0 x 8 minutes with goal rpm >/= 55 for stengthening and activity tolerance.            PT Short Term Goals - 10/19/14 1534    PT SHORT TERM GOAL #1   Title see LTG's  only 4 week POC           PT Long Term Goals - 11/28/14 2007    PT LONG TERM GOAL #1   Title Pt will demonstrate a lowered fall risk evident by scoring >50/56 on BERG balance test. Target date: 11/15/14. New target date: 12/14/14.   Baseline 11/15/14: scored 45/56 today. All unmet goals will be carried over to new POC: 12/15/14.   Status On-going   PT LONG TERM GOAL #2   Title Pt will demonstrate compentency with comprehensive HEP to address balance reaction and flexibility deficits. Target date: 11/15/14.   Baseline 11/15/14: pt independent with current HEP.   Status Achieved   PT LONG TERM GOAL #3   Title Patient will demonstrate an improved gait pattern evident by narrowed base of support and disassociation of  head/eye/body mvt while walking using LRAD at MOD I level. Target date: 11/15/14. New target date: 12/14/14.   Status On-going   PT LONG TERM GOAL #4   Title Pt will ambulate 600' over uneven terrain with LRAD, without LOB, at MOD I level to improve functional mobillity and decrease falls risk. Target date: 11/15/14. New target date: 12/14/14.   Status On-going   PT LONG TERM GOAL #5   Title Pt will perform TUG in <13.5sec. to decrease falls risk. Target  date: 12/15/14.   Status On-going   PT LONG TERM GOAL #6   Title Pt will improve gait speed with LRAD to >/=2.64ft/sec. to ambulate safely in the community. Target date: 11/15/14. New target date: 12/14/14.   Status On-going           Plan - 11/30/14 0936    Clinical Impression Statement Pt continues to make progress toward goals. Challenged with high level balance activites on complaint surfaces today with decreased ankle stabiltiy noted.   Pt will benefit from skilled therapeutic intervention in order to improve on the following deficits Abnormal gait;Decreased balance;Decreased range of motion;Impaired flexibility;Decreased strength;Decreased mobility   Rehab Potential Good   PT Frequency 2x / week   PT Duration 4 weeks   PT Treatment/Interventions Therapeutic activities;Therapeutic exercise;Balance training;Gait training;Stair training;Neuromuscular re-education;Functional mobility training;Patient/family education;ADLs/Self Care Home Management;Biofeedback;Manual techniques   PT Next Visit Plan  Dynamic gait and balance training on compliant surfaces; strengthening of bil anteriror tib/gastroc.   PT Home Exercise Plan Balance and strengthening HEP   Consulted and Agree with Plan of Care Patient        Problem List Patient Active Problem List   Diagnosis Date Noted  . Barrett's esophagus 03/09/2014  . Hypotension due to drugs 03/06/2014  . Emphysema of lung 02/19/2014  . Cough 02/19/2014  . H/O amiodarone therapy 02/16/2013  .  S/P CABG x 4   . Persistent atrial fibrillation   . Dyslipidemia, goal LDL below 70   . Personal history of colonic polyps 09/10/2011  . UC (ulcerative colitis) 09/10/2011  . Anticoagulant long-term use 09/10/2011  . VITAMIN B12 DEFICIENCY 01/30/2009  . Type 2 diabetes mellitus with complication - CAD 05/05/5101  . GOUT 10/31/2007  . Essential hypertension 10/31/2007  . ACID REFLUX DISEASE 10/31/2007  . CAD S/P PTCA-PCI then CABG x4 12/01/1995  . ST elevation myocardial infarction (STEMI) of inferior wall, subsequent episode of care 10/30/1985    Willow Ora 11/30/2014, 7:39 PM  Willow Ora, PTA, [facility] [address], [place], [place] [address] [phone] 11/30/2014, 7:39 PM

## 2014-12-05 ENCOUNTER — Encounter: Payer: Self-pay | Admitting: Physical Therapy

## 2014-12-05 ENCOUNTER — Ambulatory Visit: Payer: Medicare Other | Admitting: Physical Therapy

## 2014-12-05 DIAGNOSIS — R269 Unspecified abnormalities of gait and mobility: Secondary | ICD-10-CM

## 2014-12-05 DIAGNOSIS — R270 Ataxia, unspecified: Secondary | ICD-10-CM

## 2014-12-05 NOTE — Therapy (Signed)
[facility] [address] [place], [place], [address] Phone: [phone]   Fax:  [phone]  Physical Therapy Treatment  Patient Details  Name: Timothy Mclean MRN: [id number] Date of Birth: 08/14/1940 Referring Provider:  Derinda Late, MD  Encounter Date: 12/05/2014      PT End of Session - 12/05/14 0935    Visit Number 12  g12   Number of Visits 9   Date for PT Re-Evaluation 11/23/14   Authorization Type medicare g-code needed   PT Start Time 0932   PT Stop Time 1015   PT Time Calculation (min) 43 min   Equipment Utilized During Treatment Gait belt   Activity Tolerance Patient tolerated treatment well   Behavior During Therapy [facility] for tasks assessed/performed      Past Medical History  Diagnosis Date  . Gout, unspecified   . Iron deficiency anemia, unspecified   . Other and unspecified hyperlipidemia   . Esophageal reflux   . Unspecified essential hypertension   . Type II or unspecified type diabetes mellitus without mention of complication, not stated as uncontrolled   . Ulcerative colitis, unspecified   . Other B-complex deficiencies   . ST elevation myocardial infarction (STEMI) of inferior wall, subsequent episode of care [place]    a) PTCA of Cx; b) CABG   . CAD S/P percutaneous coronary angioplasty 1987    angioplasty and  STENT- Cx; MYOVIEW 1/'05: ? Ischemia vs. artifact --> sent for cath  . S/P CABG x 4 1997    a) LIMA-LAD, SVG to OM, SVG-dRCA-RPL; b) FALSE + MYOVIEW --> CATH 1/'05: 100% LAD after widelly patent D1, all Cx OM branches 100%, pRCA 100%, SVG-PDA patent w/ retrograde filling of RPL, SVG-OM widely patent ~ normal OM, LIMA-LAD patent.  [name] PAF (paroxysmal atrial fibrillation)     Last reported episode 2012; on amiodarone and anticoagulated on warfarin  . Dyslipidemia, goal LDL below 70     Doing better off of Zocor, currently on Lescol   . GERD (gastroesophageal reflux disease)   .  DDD (degenerative disc disease)   . Spinal stenosis   . Barrett's esophagus 03/09/2014  . Personal history of colonic polyps     Past Surgical History  Procedure Laterality Date  . Coronary artery bypass graft  11/1995    LIMA-LAD, SVG to OM, SVG-dRCA-RPL  . Cholecystectomy    . Cardiac catheterization  08/17/2003    "False positive stress test " grafts patent; RCA proximal 100% LAD 100% occlusion after normal D1 with 80% ostial as SP1; circumflex patent but OM1 on to all occluded; EF 50-55%  . Cardiac catheterization  11/1995    Preop CABG: LAD 90% at D1, circumflex 100 and OM a 90% proximal, 80% distal  . Nm myoview ltd  2005    Questionable ischemia that is likely either infarct versus artifact; normal  . Colonoscopy    . Flexible sigmoidoscopy    . Esophagogastroduodenoscopy    . Transthoracic echocardiogram  12/03/2008    LVEF =>55% normal study  . Nm myocar perf ejection fraction  07/03/2003    FALSE POSITIVE TEST; Bruce protocol, negative test with scintigraphic evidence of inferoapical scar, diaphragmatic attenuation, moderate ischemia.  . Carotid duplex doppler  12/23/2009    Right&Left ICAs 0-49%, mildly abnormal study,     There were no vitals filed for this visit.  Visit Diagnosis:  Abnormality of gait  Ataxia      Subjective Assessment - 12/05/14  0935    Subjective No new complaints. No falls or pain to report.   Currently in Pain? No/denies          [facility] Adult PT Treatment/Exercise - 12/05/14 0937    Ambulation/Gait   Ambulation/Gait Yes   Ambulation/Gait Assistance 4: Min guard   Ambulation Distance (Feet) 550 Feet   Assistive device None   Gait Pattern Step-through pattern;Decreased stride length;Right flexed knee in stance;Trunk flexed;Poor foot clearance - left;Poor foot clearance - right   Ambulation Surface Level;Indoor   Ramp Other (comment)  min guard assist with no AD x 3 reps   Curb 4: Min assist  no AD x 3 reps   High Level Balance   High  Level Balance Activities Marching forwards;Marching backwards;Tandem walking  tandem fwd/bwd. heel/toe walking fwd/bwd   High Level Balance Comments on red mats with light UE support on counter  x 3 laps each with min assist for balance and cues on correct ex form/technique; 6 cones in a row along edge of red mats: alternating toe taps to each with side stepping x 1 lap each way, alternating double toe taps to each with side stepping x 1 lap each with min to mod HHA/support for balance. cues on weight shifting and posture.                                             Knee/Hip Exercises: Aerobic   Stationary Bike Nustep x 4 extremities level 4.0 x 8 minutes with goal >/= 50 steps per minute for strengthening and activity tolerance                            PT Short Term Goals - 10/19/14 1534    PT SHORT TERM GOAL #1   Title see LTG's  only 4 week POC           PT Long Term Goals - 11/28/14 2007    PT LONG TERM GOAL #1   Title Pt will demonstrate a lowered fall risk evident by scoring >50/56 on BERG balance test. Target date: 11/15/14. New target date: 12/14/14.   Baseline 11/15/14: scored 45/56 today. All unmet goals will be carried over to new POC: 12/15/14.   Status On-going   PT LONG TERM GOAL #2   Title Pt will demonstrate compentency with comprehensive HEP to address balance reaction and flexibility deficits. Target date: 11/15/14.   Baseline 11/15/14: pt independent with current HEP.   Status Achieved   PT LONG TERM GOAL #3   Title Patient will demonstrate an improved gait pattern evident by narrowed base of support and disassociation of head/eye/body mvt while walking using LRAD at MOD I level. Target date: 11/15/14. New target date: 12/14/14.   Status On-going   PT LONG TERM GOAL #4   Title Pt will ambulate 600' over uneven terrain with LRAD, without LOB, at MOD I level to improve functional mobillity and decrease falls risk. Target date: 11/15/14. New target date: 12/14/14.   Status  On-going   PT LONG TERM GOAL #5   Title Pt will perform TUG in <13.5sec. to decrease falls risk. Target date: 12/15/14.   Status On-going   PT LONG TERM GOAL #6   Title Pt will improve gait speed with LRAD to >/=2.60f/sec. to ambulate safely in the community. Target date:  11/15/14. New target date: 12/14/14.   Status On-going           Plan - 12/05/14 0936    Clinical Impression Statement Continues to be challlenged with balance activites on complaint surfaces and involving single leg stance. Progressing toward goals with less cues/assist needed today.   Pt will benefit from skilled therapeutic intervention in order to improve on the following deficits Abnormal gait;Decreased balance;Decreased range of motion;Impaired flexibility;Decreased strength;Decreased mobility   Rehab Potential Good   PT Frequency 2x / week   PT Duration 4 weeks   PT Treatment/Interventions Therapeutic activities;Therapeutic exercise;Balance training;Gait training;Stair training;Neuromuscular re-education;Functional mobility training;Patient/family education;ADLs/Self Care Home Management;Biofeedback;Manual techniques   PT Next Visit Plan  Dynamic gait and balance training on compliant surfaces; strengthening of bil anteriror tib/gastroc.   PT Home Exercise Plan Balance and strengthening HEP   Consulted and Agree with Plan of Care Patient        Problem List Patient Active Problem List   Diagnosis Date Noted  . Barrett's esophagus 03/09/2014  . Hypotension due to drugs 03/06/2014  . Emphysema of lung 02/19/2014  . Cough 02/19/2014  . H/O amiodarone therapy 02/16/2013  . S/P CABG x 4   . Persistent atrial fibrillation   . Dyslipidemia, goal LDL below 70   . Personal history of colonic polyps 09/10/2011  . UC (ulcerative colitis) 09/10/2011  . Anticoagulant long-term use 09/10/2011  . VITAMIN B12 DEFICIENCY 01/30/2009  . Type 2 diabetes mellitus with complication - CAD 84/21/0312  . GOUT 10/31/2007  .  Essential hypertension 10/31/2007  . ACID REFLUX DISEASE 10/31/2007  . CAD S/P PTCA-PCI then CABG x4 12/01/1995  . ST elevation myocardial infarction (STEMI) of inferior wall, subsequent episode of care 10/30/1985    Willow Ora 12/06/2014, 10:15 AM  Willow Ora, PTA, [facility] [address] [place], [place] [address] [phone] 12/06/2014, 10:15 AM

## 2014-12-07 ENCOUNTER — Ambulatory Visit: Payer: Medicare Other | Admitting: Physical Therapy

## 2014-12-07 ENCOUNTER — Encounter: Payer: Self-pay | Admitting: Physical Therapy

## 2014-12-07 DIAGNOSIS — R270 Ataxia, unspecified: Secondary | ICD-10-CM

## 2014-12-07 DIAGNOSIS — R269 Unspecified abnormalities of gait and mobility: Secondary | ICD-10-CM | POA: Diagnosis not present

## 2014-12-07 NOTE — Therapy (Signed)
[facility] [address] [place], [place], [address] Phone: [phone]   Fax:  [phone]  Physical Therapy Treatment  Patient Details  Name: Timothy Mclean MRN: [id number] Date of Birth: April 18, 1941 Referring Provider:  Derinda Late, MD  Encounter Date: 12/07/2014      PT End of Session - 12/07/14 1105    Visit Number 82  g12   Number of Visits 18  adjusted number to reflect renewal visits   Date for PT Re-Evaluation 11/23/14   Authorization Type medicare g-code needed   PT Start Time 1102   PT Stop Time 1143   PT Time Calculation (min) 41 min   Equipment Utilized During Treatment Gait belt   Activity Tolerance Patient tolerated treatment well   Behavior During Therapy [facility] for tasks assessed/performed      Past Medical History  Diagnosis Date  . Gout, unspecified   . Iron deficiency anemia, unspecified   . Other and unspecified hyperlipidemia   . Esophageal reflux   . Unspecified essential hypertension   . Type II or unspecified type diabetes mellitus without mention of complication, not stated as uncontrolled   . Ulcerative colitis, unspecified   . Other B-complex deficiencies   . ST elevation myocardial infarction (STEMI) of inferior wall, subsequent episode of care [place]    a) PTCA of Cx; b) CABG   . CAD S/P percutaneous coronary angioplasty 1987    angioplasty and  STENT- Cx; MYOVIEW 1/'05: ? Ischemia vs. artifact --> sent for cath  . S/P CABG x 4 1997    a) LIMA-LAD, SVG to OM, SVG-dRCA-RPL; b) FALSE + MYOVIEW --> CATH 1/'05: 100% LAD after widelly patent D1, all Cx OM branches 100%, pRCA 100%, SVG-PDA patent w/ retrograde filling of RPL, SVG-OM widely patent ~ normal OM, LIMA-LAD patent.  [name] PAF (paroxysmal atrial fibrillation)     Last reported episode 2012; on amiodarone and anticoagulated on warfarin  . Dyslipidemia, goal LDL below 70     Doing better off of Zocor, currently on Lescol   .  GERD (gastroesophageal reflux disease)   . DDD (degenerative disc disease)   . Spinal stenosis   . Barrett's esophagus 03/09/2014  . Personal history of colonic polyps     Past Surgical History  Procedure Laterality Date  . Coronary artery bypass graft  11/1995    LIMA-LAD, SVG to OM, SVG-dRCA-RPL  . Cholecystectomy    . Cardiac catheterization  08/17/2003    "False positive stress test " grafts patent; RCA proximal 100% LAD 100% occlusion after normal D1 with 80% ostial as SP1; circumflex patent but OM1 on to all occluded; EF 50-55%  . Cardiac catheterization  11/1995    Preop CABG: LAD 90% at D1, circumflex 100 and OM a 90% proximal, 80% distal  . Nm myoview ltd  2005    Questionable ischemia that is likely either infarct versus artifact; normal  . Colonoscopy    . Flexible sigmoidoscopy    . Esophagogastroduodenoscopy    . Transthoracic echocardiogram  12/03/2008    LVEF =>55% normal study  . Nm myocar perf ejection fraction  07/03/2003    FALSE POSITIVE TEST; Bruce protocol, negative test with scintigraphic evidence of inferoapical scar, diaphragmatic attenuation, moderate ischemia.  . Carotid duplex doppler  12/23/2009    Right&Left ICAs 0-49%, mildly abnormal study,     There were no vitals filed for this visit.  Visit Diagnosis:  Abnormality of gait  Ataxia  Subjective Assessment - 12/07/14 1105    Subjective No new complaints. No falls or pain to report.   Currently in Pain? No/denies           [facility] Adult PT Treatment/Exercise - 12/07/14 1106    Ambulation/Gait   Ambulation/Gait Yes   Ambulation/Gait Assistance 4: Min guard   Ambulation/Gait Assistance Details cues on posture, pt with good bil foot clearance today   Ambulation Distance (Feet) 630 Feet   Assistive device None   Gait Pattern Step-through pattern;Decreased stride length;Right flexed knee in stance;Trunk flexed   Ambulation Surface Level;Indoor   Knee/Hip Exercises: Aerobic   Stationary  Bike Scifit x 4 extremities level 4.3 x 8 minutes with goal >/= 70 rpm for strengthening and activity tolerance           Knee/Hip Exercises: Standing   Heel Raises 10 reps;5 seconds;2 sets  2# ankle weights on    Heel Raises Limitations cues to keep knees straight during the hold times with heels lifted   Knee Flexion AROM;Strengthening;Both;2 sets;10 reps  2# ankle weights   Knee Flexion Limitations cues on posture/hip extension during the hamstring curls   Hip ADduction AROM;Strengthening;Both;2 sets;10 reps  2# ankle weights   Hip ADduction Limitations cues on posture and ex form/techique   Other Standing Knee Exercises toe raises with 2# ankle weights 5 sec hold 2 sets  x 10 reps with cues on posture and ex form   Other Standing Knee Exercises alternating hip extension with 2# ankle weights 2 sets x 10 each side. cues on ex form and technique.                              Knee/Hip Exercises: Seated   Long Arc Quad Strengthening;Both;1 set;10 reps;Weights   Long Arc Quad Weight 2 lbs.   Long Arc Quad Limitations 5 sec hold each time with cues on posture            PT Short Term Goals - 10/19/14 1534    PT SHORT TERM GOAL #1   Title see LTG's  only 4 week POC           PT Long Term Goals - 11/28/14 2007    PT LONG TERM GOAL #1   Title Pt will demonstrate a lowered fall risk evident by scoring >50/56 on BERG balance test. Target date: 11/15/14. New target date: 12/14/14.   Baseline 11/15/14: scored 45/56 today. All unmet goals will be carried over to new POC: 12/15/14.   Status On-going   PT LONG TERM GOAL #2   Title Pt will demonstrate compentency with comprehensive HEP to address balance reaction and flexibility deficits. Target date: 11/15/14.   Baseline 11/15/14: pt independent with current HEP.   Status Achieved   PT LONG TERM GOAL #3   Title Patient will demonstrate an improved gait pattern evident by narrowed base of support and disassociation of head/eye/body mvt while  walking using LRAD at MOD I level. Target date: 11/15/14. New target date: 12/14/14.   Status On-going   PT LONG TERM GOAL #4   Title Pt will ambulate 600' over uneven terrain with LRAD, without LOB, at MOD I level to improve functional mobillity and decrease falls risk. Target date: 11/15/14. New target date: 12/14/14.   Status On-going   PT LONG TERM GOAL #5   Title Pt will perform TUG in <13.5sec. to decrease falls risk. Target date: 12/15/14.  Status On-going   PT LONG TERM GOAL #6   Title Pt will improve gait speed with LRAD to >/=2.44f/sec. to ambulate safely in the community. Target date: 11/15/14. New target date: 12/14/14.   Status On-going           Plan - 12/07/14 1106    Clinical Impression Statement Pt reported feeling less tight after exercises performed today. Making steady progress toward goals.   Pt will benefit from skilled therapeutic intervention in order to improve on the following deficits Abnormal gait;Decreased balance;Decreased range of motion;Impaired flexibility;Decreased strength;Decreased mobility   Rehab Potential Good   PT Frequency 2x / week   PT Duration 4 weeks   PT Treatment/Interventions Therapeutic activities;Therapeutic exercise;Balance training;Gait training;Stair training;Neuromuscular re-education;Functional mobility training;Patient/family education;ADLs/Self Care Home Management;Biofeedback;Manual techniques   PT Next Visit Plan  Dynamic gait and balance training on compliant surfaces; strengthening of bil anteriror tib/gastroc.   PT Home Exercise Plan Balance and strengthening HEP   Consulted and Agree with Plan of Care Patient        Problem List Patient Active Problem List   Diagnosis Date Noted  . Barrett's esophagus 03/09/2014  . Hypotension due to drugs 03/06/2014  . Emphysema of lung 02/19/2014  . Cough 02/19/2014  . H/O amiodarone therapy 02/16/2013  . S/P CABG x 4   . Persistent atrial fibrillation   . Dyslipidemia, goal LDL  below 70   . Personal history of colonic polyps 09/10/2011  . UC (ulcerative colitis) 09/10/2011  . Anticoagulant long-term use 09/10/2011  . VITAMIN B12 DEFICIENCY 01/30/2009  . Type 2 diabetes mellitus with complication - CAD 030/14/9969 . GOUT 10/31/2007  . Essential hypertension 10/31/2007  . ACID REFLUX DISEASE 10/31/2007  . CAD S/P PTCA-PCI then CABG x4 12/01/1995  . ST elevation myocardial infarction (STEMI) of inferior wall, subsequent episode of care 10/30/1985    BWillow Ora5/20/2016, 4:29 PM  KWillow Ora PTA, [address] [place] [phone]/20/2016, 4:29 PM

## 2014-12-14 ENCOUNTER — Encounter: Payer: Self-pay | Admitting: Physical Therapy

## 2014-12-14 ENCOUNTER — Ambulatory Visit: Payer: Medicare Other | Admitting: Physical Therapy

## 2014-12-14 DIAGNOSIS — R270 Ataxia, unspecified: Secondary | ICD-10-CM

## 2014-12-14 DIAGNOSIS — R269 Unspecified abnormalities of gait and mobility: Secondary | ICD-10-CM | POA: Diagnosis not present

## 2014-12-14 NOTE — Therapy (Signed)
[facility] [address] [place], [place], [address] Phone: [phone]   Fax:  [phone]  Physical Therapy Treatment  Patient Details  Name: Timothy Mclean MRN: [id number] Date of Birth: 11-18-1940 Referring Provider:  Derinda Late, MD  Encounter Date: 12/14/2014      PT End of Session - 12/14/14 0934    Visit Number 14  g14   Number of Visits 18  adjusted number to reflect renewal visits   Date for PT Re-Evaluation 12/15/14   Authorization Type medicare g-code needed   PT Start Time 0931   PT Stop Time 1013   PT Time Calculation (min) 42 min   Equipment Utilized During Treatment Gait belt   Activity Tolerance Patient tolerated treatment well   Behavior During Therapy [facility] for tasks assessed/performed      Past Medical History  Diagnosis Date  . Gout, unspecified   . Iron deficiency anemia, unspecified   . Other and unspecified hyperlipidemia   . Esophageal reflux   . Unspecified essential hypertension   . Type II or unspecified type diabetes mellitus without mention of complication, not stated as uncontrolled   . Ulcerative colitis, unspecified   . Other B-complex deficiencies   . ST elevation myocardial infarction (STEMI) of inferior wall, subsequent episode of care [place]    a) PTCA of Cx; b) CABG   . CAD S/P percutaneous coronary angioplasty 1987    angioplasty and  STENT- Cx; MYOVIEW 1/'05: ? Ischemia vs. artifact --> sent for cath  . S/P CABG x 4 1997    a) LIMA-LAD, SVG to OM, SVG-dRCA-RPL; b) FALSE + MYOVIEW --> CATH 1/'05: 100% LAD after widelly patent D1, all Cx OM branches 100%, pRCA 100%, SVG-PDA patent w/ retrograde filling of RPL, SVG-OM widely patent ~ normal OM, LIMA-LAD patent.  [name] PAF (paroxysmal atrial fibrillation)     Last reported episode 2012; on amiodarone and anticoagulated on warfarin  . Dyslipidemia, goal LDL below 70     Doing better off of Zocor, currently on Lescol   .  GERD (gastroesophageal reflux disease)   . DDD (degenerative disc disease)   . Spinal stenosis   . Barrett's esophagus 03/09/2014  . Personal history of colonic polyps     Past Surgical History  Procedure Laterality Date  . Coronary artery bypass graft  11/1995    LIMA-LAD, SVG to OM, SVG-dRCA-RPL  . Cholecystectomy    . Cardiac catheterization  08/17/2003    "False positive stress test " grafts patent; RCA proximal 100% LAD 100% occlusion after normal D1 with 80% ostial as SP1; circumflex patent but OM1 on to all occluded; EF 50-55%  . Cardiac catheterization  11/1995    Preop CABG: LAD 90% at D1, circumflex 100 and OM a 90% proximal, 80% distal  . Nm myoview ltd  2005    Questionable ischemia that is likely either infarct versus artifact; normal  . Colonoscopy    . Flexible sigmoidoscopy    . Esophagogastroduodenoscopy    . Transthoracic echocardiogram  12/03/2008    LVEF =>55% normal study  . Nm myocar perf ejection fraction  07/03/2003    FALSE POSITIVE TEST; Bruce protocol, negative test with scintigraphic evidence of inferoapical scar, diaphragmatic attenuation, moderate ischemia.  . Carotid duplex doppler  12/23/2009    Right&Left ICAs 0-49%, mildly abnormal study,     There were no vitals filed for this visit.  Visit Diagnosis:  Abnormality of gait  Ataxia  Subjective Assessment - 12/14/14 0934    Subjective No new complaints. No falls or pain to report.   Currently in Pain? No/denies            [facility] Adult PT Treatment/Exercise - 12/14/14 0936    Ambulation/Gait   Ambulation/Gait Yes   Ambulation/Gait Assistance 4: Min guard;5: Supervision   Ambulation/Gait Assistance Details cues on posture and foot clearance on compliant surfaces. no loss of balance noted during gait, some veering/staggering on uneven surfaces noted.                     Ambulation Distance (Feet) 1000 Feet  or more   Assistive device None;Straight cane  ntermittent use of cane on  inclines/declines and with grass   Gait Pattern Step-through pattern;Decreased stride length;Right flexed knee in stance;Trunk flexed   Ambulation Surface Level;Unlevel;Indoor;Outdoor;Paved;Gravel;Grass   Curb 5: Supervision  with cane   Curb Details (indicate cue type and reason) stepping over 2 concrete barrier curbs outside   Dynamic Standing Balance   Dynamic Standing - Balance Support During functional activity;Bilateral upper extremity supported   Dynamic Standing - Level of Assistance 4: Min assist   Dynamic Standing - Balance Activities Foam balance beam   Dynamic Standing - Comments on blue foam beam: side stepping left/right x 3 laps each way, tandem gait fwd/bwd x 3 laps with light UE assist and cues on posture/ex form. standing with feet across beam: alternating fwd heel taps x 10 reps each side, alternating backward toe taps x 10 reps each side with cues for light UE support and posture/ex form.                     High Level Balance   High Level Balance Activities Marching forwards;Marching backwards;Tandem walking;Side stepping  tandem fwd/bwd, toe walk fwd/bwd, heel walk fwd/bwd   High Level Balance Comments on both red mats, no UE support, min assit for balance. 3 laps each/each way.   Knee/Hip Exercises: Aerobic   Stationary Bike Scifit x 4 extremities level 4.5 x 8 minutes with goal >/= 70 rpm for strengthening and activity tolerance                    PT Short Term Goals - 10/19/14 1534    PT SHORT TERM GOAL #1   Title see LTG's  only 4 week POC           PT Long Term Goals - 11/28/14 2007    PT LONG TERM GOAL #1   Title Pt will demonstrate a lowered fall risk evident by scoring >50/56 on BERG balance test. Target date: 11/15/14. New target date: 12/14/14.   Baseline 11/15/14: scored 45/56 today. All unmet goals will be carried over to new POC: 12/15/14.   Status On-going   PT LONG TERM GOAL #2   Title Pt will demonstrate compentency with comprehensive HEP to  address balance reaction and flexibility deficits. Target date: 11/15/14.   Baseline 11/15/14: pt independent with current HEP.   Status Achieved   PT LONG TERM GOAL #3   Title Patient will demonstrate an improved gait pattern evident by narrowed base of support and disassociation of head/eye/body mvt while walking using LRAD at MOD I level. Target date: 11/15/14. New target date: 12/14/14.   Status On-going   PT LONG TERM GOAL #4   Title Pt will ambulate 600' over uneven terrain with LRAD, without LOB, at MOD I level to improve functional  mobillity and decrease falls risk. Target date: 11/15/14. New target date: 12/14/14.   Status On-going   PT LONG TERM GOAL #5   Title Pt will perform TUG in <13.5sec. to decrease falls risk. Target date: 12/15/14.   Status On-going   PT LONG TERM GOAL #6   Title Pt will improve gait speed with LRAD to >/=2.67ft/sec. to ambulate safely in the community. Target date: 11/15/14. New target date: 12/14/14.   Status On-going           Plan - 12/14/14 0934    Clinical Impression Statement Pt with increased gait distance today with no issues noted. Progressing well towards goals.   Pt will benefit from skilled therapeutic intervention in order to improve on the following deficits Abnormal gait;Decreased balance;Decreased range of motion;Impaired flexibility;Decreased strength;Decreased mobility   Rehab Potential Good   PT Frequency 2x / week   PT Duration 4 weeks   PT Treatment/Interventions Therapeutic activities;Therapeutic exercise;Balance training;Gait training;Stair training;Neuromuscular re-education;Functional mobility training;Patient/family education;ADLs/Self Care Home Management;Biofeedback;Manual techniques   PT Next Visit Plan Assess LTG's next visit   PT Home Exercise Plan Balance and strengthening HEP   Consulted and Agree with Plan of Care Patient        Problem List Patient Active Problem List   Diagnosis Date Noted  . Barrett's esophagus  03/09/2014  . Hypotension due to drugs 03/06/2014  . Emphysema of lung 02/19/2014  . Cough 02/19/2014  . H/O amiodarone therapy 02/16/2013  . S/P CABG x 4   . Persistent atrial fibrillation   . Dyslipidemia, goal LDL below 70   . Personal history of colonic polyps 09/10/2011  . UC (ulcerative colitis) 09/10/2011  . Anticoagulant long-term use 09/10/2011  . VITAMIN B12 DEFICIENCY 01/30/2009  . Type 2 diabetes mellitus with complication - CAD 21/22/4825  . GOUT 10/31/2007  . Essential hypertension 10/31/2007  . ACID REFLUX DISEASE 10/31/2007  . CAD S/P PTCA-PCI then CABG x4 12/01/1995  . ST elevation myocardial infarction (STEMI) of inferior wall, subsequent episode of care 10/30/1985    Willow Ora 12/14/2014, 1:12 PM  Willow Ora, PTA, [place] [address], [place], [place] [address] [phone] 12/14/2014, 1:12 PM

## 2014-12-18 ENCOUNTER — Encounter: Payer: Self-pay | Admitting: Physical Therapy

## 2014-12-18 ENCOUNTER — Ambulatory Visit: Payer: Medicare Other | Admitting: Physical Therapy

## 2014-12-18 DIAGNOSIS — R269 Unspecified abnormalities of gait and mobility: Secondary | ICD-10-CM | POA: Diagnosis not present

## 2014-12-18 DIAGNOSIS — R270 Ataxia, unspecified: Secondary | ICD-10-CM

## 2014-12-18 NOTE — Therapy (Addendum)
[facility] [address] [place], [place], [address] Phone: [phone]   Fax:  [phone]  Physical Therapy Treatment  Patient Details  Name: Timothy Mclean MRN: [id number] Date of Birth: Feb 26, 1941 Referring Provider:  Derinda Late, MD  Encounter Date: 12/18/2014      PT End of Session - 12/18/14 1019    Visit Number 15  g15   Number of Visits 18  adjusted number to reflect renewal visits   Date for PT Re-Evaluation 12/15/14   Authorization Type medicare g-code needed   PT Start Time 1572   PT Stop Time 1053   PT Time Calculation (min) 38 min   Equipment Utilized During Treatment Gait belt   Activity Tolerance Patient tolerated treatment well   Behavior During Therapy [facility] for tasks assessed/performed      Past Medical History  Diagnosis Date  . Gout, unspecified   . Iron deficiency anemia, unspecified   . Other and unspecified hyperlipidemia   . Esophageal reflux   . Unspecified essential hypertension   . Type II or unspecified type diabetes mellitus without mention of complication, not stated as uncontrolled   . Ulcerative colitis, unspecified   . Other B-complex deficiencies   . ST elevation myocardial infarction (STEMI) of inferior wall, subsequent episode of care [place]    a) PTCA of Cx; b) CABG   . CAD S/P percutaneous coronary angioplasty 1987    angioplasty and  STENT- Cx; MYOVIEW 1/'05: ? Ischemia vs. artifact --> sent for cath  . S/P CABG x 4 1997    a) LIMA-LAD, SVG to OM, SVG-dRCA-RPL; b) FALSE + MYOVIEW --> CATH 1/'05: 100% LAD after widelly patent D1, all Cx OM branches 100%, pRCA 100%, SVG-PDA patent w/ retrograde filling of RPL, SVG-OM widely patent ~ normal OM, LIMA-LAD patent.  [name] PAF (paroxysmal atrial fibrillation)     Last reported episode 2012; on amiodarone and anticoagulated on warfarin  . Dyslipidemia, goal LDL below 70     Doing better off of Zocor, currently on Lescol   .  GERD (gastroesophageal reflux disease)   . DDD (degenerative disc disease)   . Spinal stenosis   . Barrett's esophagus 03/09/2014  . Personal history of colonic polyps     Past Surgical History  Procedure Laterality Date  . Coronary artery bypass graft  11/1995    LIMA-LAD, SVG to OM, SVG-dRCA-RPL  . Cholecystectomy    . Cardiac catheterization  08/17/2003    "False positive stress test " grafts patent; RCA proximal 100% LAD 100% occlusion after normal D1 with 80% ostial as SP1; circumflex patent but OM1 on to all occluded; EF 50-55%  . Cardiac catheterization  11/1995    Preop CABG: LAD 90% at D1, circumflex 100 and OM a 90% proximal, 80% distal  . Nm myoview ltd  2005    Questionable ischemia that is likely either infarct versus artifact; normal  . Colonoscopy    . Flexible sigmoidoscopy    . Esophagogastroduodenoscopy    . Transthoracic echocardiogram  12/03/2008    LVEF =>55% normal study  . Nm myocar perf ejection fraction  07/03/2003    FALSE POSITIVE TEST; Bruce protocol, negative test with scintigraphic evidence of inferoapical scar, diaphragmatic attenuation, moderate ischemia.  . Carotid duplex doppler  12/23/2009    Right&Left ICAs 0-49%, mildly abnormal study,     There were no vitals filed for this visit.  Visit Diagnosis:  Abnormality of gait  Ataxia  Subjective Assessment - 12/18/14 1019    Subjective No new complaints. No falls or pain to report.   Currently in Pain? No/denies           [facility] Adult PT Treatment/Exercise - 12/18/14 1021    Ambulation/Gait   Ambulation/Gait Yes   Ambulation/Gait Assistance 6: Modified independent (Device/Increase time)   Ambulation/Gait Assistance Details occasional cues on posture with gait.   Ambulation Distance (Feet) 600 Feet  or more   Assistive device None   Gait Pattern Step-through pattern;Decreased stride length;Right flexed knee in stance;Trunk flexed   Ambulation Surface  Level;Unlevel;Indoor;Outdoor;Paved;Gravel;Grass  pine needles   Gait velocity 11.15 sec's= 2.94 ft/sec without AD   Door Management 6: Modified independent (Device/Increase time)   Curb 6: Modified independent (Device/increase time)  out door curb without AD   Berg Balance Test   Sit to Stand Able to stand without using hands and stabilize independently   Standing Unsupported Able to stand safely 2 minutes   Sitting with Back Unsupported but Feet Supported on Floor or Stool Able to sit safely and securely 2 minutes   Stand to Sit Sits safely with minimal use of hands   Transfers Able to transfer safely, minor use of hands   Standing Unsupported with Eyes Closed Able to stand 10 seconds safely   Standing Ubsupported with Feet Together Able to place feet together independently and stand for 1 minute with supervision  increased sway with ankle roll, no loss of balance   From Standing, Reach Forward with Outstretched Arm Can reach confidently >25 cm (10")   From Standing Position, Pick up Object from Floor Able to pick up shoe safely and easily   From Standing Position, Turn to Look Behind Over each Shoulder Looks behind one side only/other side shows less weight shift  right > left   Turn 360 Degrees Able to turn 360 degrees safely but slowly  > 4 seconds each way   Standing Unsupported, Alternately Place Feet on Step/Stool Able to stand independently and safely and complete 8 steps in 20 seconds  19.87 sec's   Standing Unsupported, One Foot in Front Able to plae foot ahead of the other independently and hold 30 seconds   Standing on One Leg Tries to lift leg/unable to hold 3 seconds but remains standing independently   Total Score 48   Timed Up and Go Test   TUG Normal TUG   Normal TUG (seconds) 10.33  no device           PT Short Term Goals - 10/19/14 1534    PT SHORT TERM GOAL #1   Title see LTG's  only 4 week POC           PT Long Term Goals - 12/18/14 1019    PT LONG  TERM GOAL #1   Title Pt will demonstrate a lowered fall risk evident by scoring >50/56 on BERG balance test. Target date: 11/15/14. New target date: 12/14/14.   Baseline 12/18/14: 48/56 scored today   Status Not Met   PT LONG TERM GOAL #2   Title Pt will demonstrate compentency with comprehensive HEP to address balance reaction and flexibility deficits. Target date: 11/15/14.   Baseline 11/15/14: pt independent with current HEP.   Status Achieved   PT LONG TERM GOAL #3   Title Patient will demonstrate an improved gait pattern evident by narrowed base of support and disassociation of head/eye/body mvt while walking using LRAD at MOD I level. Target date: 11/15/14.  New target date: 12/14/14.   Baseline 12/18/14: met today   Status Achieved   PT LONG TERM GOAL #4   Title Pt will ambulate 600' over uneven terrain with LRAD, without LOB, at MOD I level to improve functional mobillity and decrease falls risk. Target date: 11/15/14. New target date: 12/14/14.   Baseline 12/18/14: met today   Status Achieved   PT LONG TERM GOAL #5   Title Pt will perform TUG in <13.5sec. to decrease falls risk. Target date: 12/15/14.   Baseline 12/18/14: 10.33 sec without AD   Status Achieved   PT LONG TERM GOAL #6   Title Pt will improve gait speed with LRAD to >/=2.31f/sec. to ambulate safely in the community. Target date: 11/15/14. New target date: 12/14/14.   Baseline 12/18/14: 2.94 ft/sec without AD   Status Achieved           Plan - 12/18/14 1019    Clinical Impression Statement All LTG's met except for Berg Balance Test score. Pt agreeable to discharge today.   Pt will benefit from skilled therapeutic intervention in order to improve on the following deficits Abnormal gait;Decreased balance;Decreased range of motion;Impaired flexibility;Decreased strength;Decreased mobility   Rehab Potential Good   PT Frequency 2x / week   PT Duration 4 weeks   PT Treatment/Interventions Therapeutic activities;Therapeutic  exercise;Balance training;Gait training;Stair training;Neuromuscular re-education;Functional mobility training;Patient/family education;ADLs/Self Care Home Management;Biofeedback;Manual techniques   PT Next Visit Plan discharge today with pt to continue with HEP and look into communty fitness options.   PT Home Exercise Plan Balance and strengthening HEP   Consulted and Agree with Plan of Care Patient        Problem List Patient Active Problem List   Diagnosis Date Noted  . Barrett's esophagus 03/09/2014  . Hypotension due to drugs 03/06/2014  . Emphysema of lung 02/19/2014  . Cough 02/19/2014  . H/O amiodarone therapy 02/16/2013  . S/P CABG x 4   . Persistent atrial fibrillation   . Dyslipidemia, goal LDL below 70   . Personal history of colonic polyps 09/10/2011  . UC (ulcerative colitis) 09/10/2011  . Anticoagulant long-term use 09/10/2011  . VITAMIN B12 DEFICIENCY 01/30/2009  . Type 2 diabetes mellitus with complication - CAD 020/35/5974 . GOUT 10/31/2007  . Essential hypertension 10/31/2007  . ACID REFLUX DISEASE 10/31/2007  . CAD S/P PTCA-PCI then CABG x4 12/01/1995  . ST elevation myocardial infarction (STEMI) of inferior wall, subsequent episode of care 10/30/1985    BWillow Ora5/31/2016, 12:42 PM  KWillow Ora PTA, [place] [place] [phone]/48/2500 137:04PM   Recert and discharge completed by PT. PHYSICAL THERAPY DISCHARGE SUMMARY  Visits from Start of Care: 15  Current functional level related to goals / functional outcomes:     PT Long Term Goals - 12/18/14 1019    PT LONG TERM GOAL #1   Title Pt will demonstrate a lowered fall risk evident by scoring >50/56 on BERG balance test. Target date: 11/15/14. New target date: 12/14/14.   Baseline 12/18/14: 48/56 scored today   Status Not Met   PT LONG TERM GOAL #2   Title Pt will demonstrate compentency with comprehensive HEP to address  balance reaction and flexibility deficits. Target date: 11/15/14.   Baseline 11/15/14: pt independent with current HEP.   Status Achieved   PT LONG TERM GOAL #3   Title Patient will demonstrate an improved gait pattern evident by narrowed base of  support and disassociation of head/eye/body mvt while walking using LRAD at MOD I level. Target date: 11/15/14. New target date: 12/14/14.   Baseline 12/18/14: met today   Status Achieved   PT LONG TERM GOAL #4   Title Pt will ambulate 600' over uneven terrain with LRAD, without LOB, at MOD I level to improve functional mobillity and decrease falls risk. Target date: 11/15/14. New target date: 12/14/14.   Baseline 12/18/14: met today   Status Achieved   PT LONG TERM GOAL #5   Title Pt will perform TUG in <13.5sec. to decrease falls risk. Target date: 12/15/14.   Baseline 12/18/14: 10.33 sec without AD   Status Achieved   PT LONG TERM GOAL #6   Title Pt will improve gait speed with LRAD to >/=2.62f/sec. to ambulate safely in the community. Target date: 11/15/14. New target date: 12/14/14.   Baseline 12/18/14: 2.94 ft/sec without AD   Status Achieved        Remaining deficits: Impaired balance, however, pt has HEP to continue to improve balance.   Education / Equipment: HEP  Plan: Patient agrees to discharge.  Patient goals were met. Patient is being discharged due to meeting the stated rehab goals.  ?????       JGeoffry Paradise PT,DPT 12/19/2014 8:00 AM Phone: [phone]: [phone]

## 2014-12-19 NOTE — Addendum Note (Signed)
Addended by: Elza Rafter on: 12/19/2014 08:01 AM   Modules accepted: Orders

## 2014-12-21 ENCOUNTER — Ambulatory Visit: Payer: Medicare Other | Admitting: Physical Therapy

## 2014-12-28 ENCOUNTER — Ambulatory Visit (INDEPENDENT_AMBULATORY_CARE_PROVIDER_SITE_OTHER): Payer: Medicare Other | Admitting: Pharmacist Clinician (PhC)/ Clinical Pharmacy Specialist

## 2014-12-28 DIAGNOSIS — Z7901 Long term (current) use of anticoagulants: Secondary | ICD-10-CM | POA: Diagnosis not present

## 2014-12-28 DIAGNOSIS — I4891 Unspecified atrial fibrillation: Secondary | ICD-10-CM

## 2014-12-28 LAB — POCT INR: INR: 2.8

## 2015-01-04 ENCOUNTER — Telehealth: Payer: Self-pay | Admitting: Internal Medicine

## 2015-01-04 NOTE — Telephone Encounter (Signed)
Lauren notified that I have scheduled him to see Alonza Bogus, PA for 01/16/15 2:00.  She will notify the patient

## 2015-01-16 ENCOUNTER — Ambulatory Visit: Payer: Medicare Other | Admitting: Gastroenterology

## 2015-01-30 ENCOUNTER — Encounter: Payer: Self-pay | Admitting: Cardiology

## 2015-01-30 ENCOUNTER — Ambulatory Visit (INDEPENDENT_AMBULATORY_CARE_PROVIDER_SITE_OTHER): Payer: Medicare Other | Admitting: Cardiology

## 2015-01-30 ENCOUNTER — Ambulatory Visit (INDEPENDENT_AMBULATORY_CARE_PROVIDER_SITE_OTHER): Payer: Medicare Other | Admitting: Pharmacist Clinician (PhC)/ Clinical Pharmacy Specialist

## 2015-01-30 VITALS — BP 130/72 | HR 66 | Ht 72.0 in | Wt 182.9 lb

## 2015-01-30 DIAGNOSIS — I4891 Unspecified atrial fibrillation: Secondary | ICD-10-CM

## 2015-01-30 DIAGNOSIS — I481 Persistent atrial fibrillation: Secondary | ICD-10-CM

## 2015-01-30 DIAGNOSIS — Z09 Encounter for follow-up examination after completed treatment for conditions other than malignant neoplasm: Secondary | ICD-10-CM

## 2015-01-30 DIAGNOSIS — Z7901 Long term (current) use of anticoagulants: Secondary | ICD-10-CM | POA: Diagnosis not present

## 2015-01-30 DIAGNOSIS — Z951 Presence of aortocoronary bypass graft: Secondary | ICD-10-CM

## 2015-01-30 DIAGNOSIS — E785 Hyperlipidemia, unspecified: Secondary | ICD-10-CM

## 2015-01-30 DIAGNOSIS — Z9229 Personal history of other drug therapy: Secondary | ICD-10-CM

## 2015-01-30 DIAGNOSIS — I4819 Other persistent atrial fibrillation: Secondary | ICD-10-CM

## 2015-01-30 DIAGNOSIS — I10 Essential (primary) hypertension: Secondary | ICD-10-CM

## 2015-01-30 DIAGNOSIS — I2119 ST elevation (STEMI) myocardial infarction involving other coronary artery of inferior wall: Secondary | ICD-10-CM

## 2015-01-30 DIAGNOSIS — I2510 Atherosclerotic heart disease of native coronary artery without angina pectoris: Secondary | ICD-10-CM

## 2015-01-30 LAB — POCT INR: INR: 2.2

## 2015-01-30 NOTE — Progress Notes (Signed)
PCP: Marylene Land, MD  Clinic Note: Chief Complaint  Patient presents with  . Follow-up     no chest pain, no shortness of breath, no edema, no pain in legs, no cramping in legs, no lightheadedness, no dizziness    HPI: Timothy Mclean is a 74 y.o. male with a PMH below who presents today for ~4 month follow-up for CAD & PAF.  Timothy Mclean was last seen on 09/18/2014. He was doing relatively well at that time, having been started back on low-dose verapamil for rapid A. Fib.  Recent Hospitalizations: n/a  Studies Reviewed: n/a  Interval History: Timothy Mclean is doing relatively well today. She continues to lose weight (which she says is mostly because of dental issues. He probably had a bunch of people and now has a plate with dentures.). As far as he can tell, he has not had any recurrences of A. Fib with any rapid or irregular heartbeat/palpitations. He does occasionally have some skipped beats, but nothing prolonged. Despite the fact his EKG today shows he is in A. Fib, he has no semblance or sense that he is in A. Fib.  He denies any significant resting or exertional dyspnea. He states he does walk a little more than he used to. He has been doing physical therapy for his back and leg pains that have been doing a little better but is not in that program anymore.  No chest pain or shortness of breath with rest or exertion.  No lightheadedness, dizziness, weakness or syncope/near syncope. No TIA/amaurosis fugax symptoms. No melena, hematochezia, hematuria, or epstaxis. No claudication.  Past Medical History  Diagnosis Date  . Gout, unspecified   . Iron deficiency anemia, unspecified   . Other and unspecified hyperlipidemia   . Esophageal reflux   . Unspecified essential hypertension   . Type II or unspecified type diabetes mellitus without mention of complication, not stated as uncontrolled   . Ulcerative colitis, unspecified   . Other B-complex deficiencies   . ST elevation  myocardial infarction (STEMI) of inferior wall, subsequent episode of care [place]    a) PTCA of Cx; b) CABG   . CAD S/P percutaneous coronary angioplasty 1987    angioplasty and  STENT- Cx; MYOVIEW 1/'05: ? Ischemia vs. artifact --> sent for cath  . S/P CABG x 4 1997    a) LIMA-LAD, SVG to OM, SVG-dRCA-RPL; b) FALSE + MYOVIEW --> CATH 1/'05: 100% LAD after widelly patent D1, all Cx OM branches 100%, pRCA 100%, SVG-PDA patent w/ retrograde filling of RPL, SVG-OM widely patent ~ normal OM, LIMA-LAD patent.  [name] PAF (paroxysmal atrial fibrillation)     Last reported episode 2012; on amiodarone and anticoagulated on warfarin  . Dyslipidemia, goal LDL below 70     Doing better off of Zocor, currently on Lescol   . GERD (gastroesophageal reflux disease)   . DDD (degenerative disc disease)   . Spinal stenosis   . Barrett's esophagus 03/09/2014  . Personal history of colonic polyps     Past Surgical History  Procedure Laterality Date  . Coronary artery bypass graft  11/1995    LIMA-LAD, SVG to OM, SVG-dRCA-RPL  . Cholecystectomy    . Cardiac catheterization  08/17/2003    "False positive stress test " grafts patent; RCA proximal 100% LAD 100% occlusion after normal D1 with 80% ostial as SP1; circumflex patent but OM1 on to all occluded; EF 50-55%  . Cardiac catheterization  11/1995    Preop  CABG: LAD 90% at D1, circumflex 100 and OM a 90% proximal, 80% distal  . Nm myoview ltd  2005    Questionable ischemia that is likely either infarct versus artifact; normal  . Colonoscopy    . Flexible sigmoidoscopy    . Esophagogastroduodenoscopy    . Transthoracic echocardiogram  12/03/2008    LVEF =>55% normal study  . Nm myocar perf ejection fraction  07/03/2003    FALSE POSITIVE TEST; Bruce protocol, negative test with scintigraphic evidence of inferoapical scar, diaphragmatic attenuation, moderate ischemia.  . Carotid duplex doppler  12/23/2009    Right&Left ICAs 0-49%, mildly abnormal study,      ROS: A comprehensive was performed. Review of Systems  Constitutional: Positive for weight loss (Decreased appetite without tea. States it is food doesn't taste as good.).  HENT: Negative for nosebleeds.   Respiratory: Positive for cough (When his allergies kick in.  He had a bad spell about 2-3 weeks ago.).   Cardiovascular: Positive for palpitations. Negative for claudication.       Per history of present illness  Gastrointestinal: Negative for blood in stool and melena.  Genitourinary: Negative for hematuria.  Musculoskeletal: Positive for back pain.  Neurological: Positive for dizziness (she saw some dizziness, but overall much improved).  Endo/Heme/Allergies: Does not bruise/bleed easily.  Psychiatric/Behavioral: Negative.   All other systems reviewed and are negative.   Current Outpatient Prescriptions on File Prior to Visit  Medication Sig Dispense Refill  . amiodarone (PACERONE) 200 MG tablet Take 1 tablet (200 mg total) by mouth daily. 90 tablet 3  . aspirin 81 MG tablet Take 81 mg by mouth daily.    . balsalazide (COLAZAL) 750 MG capsule TAKE 2 CAPSULES BY MOUTH TWICE DAILY 120 capsule 4  . BAYER CONTOUR TEST test strip   2  . CRESTOR 20 MG tablet Take 1 tablet by mouth daily.    . cyanocobalamin 2000 MCG tablet Take 2,000 mcg by mouth daily.    [name] oxybutynin (DITROPAN) 5 MG tablet Take 5 mg by mouth 3 (three) times daily.     . pantoprazole (PROTONIX) 40 MG tablet Take 1 tablet by mouth daily.    [name] triamcinolone cream (KENALOG) 0.1 % Apply 1 application topically daily.    . verapamil (CALAN-SR) 120 MG CR tablet Take 1 tablet (120 mg total) by mouth daily. 90 tablet 3  . vitamin D, CHOLECALCIFEROL, 400 UNITS tablet Take 400 Units by mouth daily.    [name] warfarin (COUMADIN) 5 MG tablet Take 1 tablet by mouth daily or as directed 90 tablet 1   No current facility-administered medications on file prior to visit.   No Known Allergies  History   Social History  . Marital  Status: Married    Spouse Name: N/A  . Number of Children: 1  . Years of Education: N/A   Occupational History  . retired    Social History Main Topics  . Smoking status: Former Smoker -- 1.00 packs/day for 32 years    Types: Cigarettes    Quit date: 08/27/1984  . Smokeless tobacco: Never Used  . Alcohol Use: No  . Drug Use: No  . Sexual Activity: Not on file   Other Topics Concern  . Not on file   Social History Narrative   He is a married father of one. Does not smoke and does not drink. He walks routinely and also does stationary bike. 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 Diabetes Mother     Wt Readings from Last 3 Encounters:  01/30/15 82.963 kg (182 lb 14.4 oz)  10/05/14 86.138 kg (189 lb 14.4 oz)  09/27/14 86.637 kg (191 lb)    PHYSICAL EXAM BP 130/72 mmHg  Pulse 66  Ht 6' (1.829 m)  Wt 82.963 kg (182 lb 14.4 oz)  BMI 24.80 kg/m2 General appearance: A&O x 3, NAD; Healthy appearing. Has lost at least 8 pounds since last visit. Looks more fit. HEENT: [place]/AT, EOMI, MMM, anicteric sclera Neck: no adenopathy, no carotid bruit and no JVD Lungs: CTAB with minimal interstitial sounds & expiratory rhonchi; normal percussion bilaterally and non-labored Heart: Irreg Rhythm, notmal rate; S1& S2 normal, 1-2/6 SEM @ RUSB--> carotids; No click, rub or gallop with non-displaced PMI Abdomen: soft, non-tender; bowel sounds normal; no masses, no organomegaly; Extremities: extremities normal, atraumatic, no cyanosis, mild LE edema Pulses: 2+ and symmetric;  Neurologic: Mental status: Alert, oriented, thought content appropriate; Grossly normal   Adult ECG Report  Rate: 66 ;  Rhythm: atrial fibrillation, premature ventricular contractions (PVC) and Possible aberrantly conducted complexes. Nonspecific T-wave changes.   Narrative Interpretation:  A. Fib has replaced sinus rhythm   Other studies Reviewed: Additional studies/ records that were reviewed today include:  Recent Labs:  Apparently PCP is check lipids.   Has not check TSH.   ASSESSMENT / PLAN: Problem List Items Addressed This Visit    Anticoagulant long-term use (Chronic)    On warfarin without bleeding issues.      CAD S/P PTCA-PCI then CABG x4 (Chronic)    No active anginal or heart failure symptoms. Continues to remain quite active as much as his back will tolerate. None available at her because of fatigue issues while on amiodarone. Continue the low-dose verapamil, which is acceptable with a normal EF. On aspirin or statin.  No routine testing for ischemia without symptoms based on prior history of "false positive test "      Relevant Orders   EKG 12-Lead (Completed)   Pulmonary Function Test   Dyslipidemia, goal LDL below 70 (Chronic)    Tolerating Crestor without significant myalgias. Lipids are being monitored by PCP. I would just asked that we check a TSH when his next labs are checked.      Relevant Orders   EKG 12-Lead (Completed)   Pulmonary Function Test   Essential hypertension (Chronic)    Stable. Has had issues with hypotension when the overshoot minutes. Hold off on restarting ARB.      H/O amiodarone therapy - Primary (Chronic)    He is due for another PFT check which will be done in October. LFTs are checked lipid panel. We'll also need TSH checked.      Relevant Orders   EKG 12-Lead (Completed)   Pulmonary Function Test   Persistent atrial fibrillation (Chronic)    Back now in A. Fib with, but asymptomatic. Remains on amiodarone and warfarin. Amiodarone is certain to be a pretty good rate control and eventual agent. At this point however he is back in A. Fib but not symptomatic. Also takes low-dose verapamil for additional rate control. Was intolerant of beta blockers in the past.      Relevant Orders    EKG 12-Lead (Completed)   Pulmonary Function Test   S/P CABG x 4 (Chronic)   ST elevation myocardial infarction (STEMI) of inferior wall, subsequent episode  of care (Chronic)      Current medicines are reviewed at length with the patient today. (+/- concerns) none The following changes have been made: none  Studies ordered: Pulmonary function tests for October  Your physician wants you to follow-up in [place].   Timothy Mclean, M.D., M.S. Interventional Cardiologist   Pager # [phone]

## 2015-01-30 NOTE — Patient Instructions (Signed)
WILL SCHEDULE FOR OCT 2016---Your physician has recommended that you have a pulmonary function test. Pulmonary Function Tests are a group of tests that measure how well air moves in and out of your lungs.  NO CHANGE WITH CURRENT MEDICATIONS   Your physician wants you to follow-up in [place]. You will receive a reminder letter in the mail two months in advance. If you don't receive a letter, please call our office to schedule the follow-up appointment.

## 2015-01-31 ENCOUNTER — Encounter: Payer: Self-pay | Admitting: Gastroenterology

## 2015-02-01 ENCOUNTER — Ambulatory Visit: Payer: Medicare Other | Admitting: Neurology

## 2015-02-01 NOTE — Assessment & Plan Note (Signed)
Back now in A. Fib with, but asymptomatic. Remains on amiodarone and warfarin. Amiodarone is certain to be a pretty good rate control and eventual agent. At this point however he is back in A. Fib but not symptomatic. Also takes low-dose verapamil for additional rate control. Was intolerant of beta blockers in the past.

## 2015-02-01 NOTE — Assessment & Plan Note (Signed)
No active anginal or heart failure symptoms. Continues to remain quite active as much as his back will tolerate. None available at her because of fatigue issues while on amiodarone. Continue the low-dose verapamil, which is acceptable with a normal EF. On aspirin or statin.  No routine testing for ischemia without symptoms based on prior history of "false positive test "

## 2015-02-01 NOTE — Assessment & Plan Note (Signed)
He is due for another PFT check which will be done in October. LFTs are checked lipid panel. We'll also need TSH checked.

## 2015-02-01 NOTE — Assessment & Plan Note (Signed)
Stable. Has had issues with hypotension when the overshoot minutes. Hold off on restarting ARB.

## 2015-02-01 NOTE — Assessment & Plan Note (Signed)
Tolerating Crestor without significant myalgias. Lipids are being monitored by PCP. I would just asked that we check a TSH when his next labs are checked.

## 2015-02-01 NOTE — Assessment & Plan Note (Signed)
On warfarin without bleeding issues.

## 2015-02-11 ENCOUNTER — Ambulatory Visit (HOSPITAL_COMMUNITY)
Admission: RE | Admit: 2015-02-11 | Discharge: 2015-02-11 | Disposition: A | Discharge status: Home / Self Care | Payer: Medicare Other | Source: Ambulatory Visit | Attending: Cardiology | Admitting: Cardiology

## 2015-02-11 DIAGNOSIS — I481 Persistent atrial fibrillation: Secondary | ICD-10-CM | POA: Diagnosis not present

## 2015-02-11 DIAGNOSIS — Z9229 Personal history of other drug therapy: Secondary | ICD-10-CM

## 2015-02-11 DIAGNOSIS — I2510 Atherosclerotic heart disease of native coronary artery without angina pectoris: Secondary | ICD-10-CM | POA: Diagnosis not present

## 2015-02-11 DIAGNOSIS — Z09 Encounter for follow-up examination after completed treatment for conditions other than malignant neoplasm: Secondary | ICD-10-CM | POA: Diagnosis not present

## 2015-02-11 DIAGNOSIS — I4819 Other persistent atrial fibrillation: Secondary | ICD-10-CM

## 2015-02-11 DIAGNOSIS — R0609 Other forms of dyspnea: Secondary | ICD-10-CM | POA: Insufficient documentation

## 2015-02-11 DIAGNOSIS — F17210 Nicotine dependence, cigarettes, uncomplicated: Secondary | ICD-10-CM | POA: Insufficient documentation

## 2015-02-11 DIAGNOSIS — E785 Hyperlipidemia, unspecified: Secondary | ICD-10-CM | POA: Diagnosis not present

## 2015-02-11 LAB — PULMONARY FUNCTION TEST
DL/VA % PRED: 55 %
DL/VA: 2.51 ml/min/mmHg/L
DLCO UNC: 19.54 ml/min/mmHg
DLCO unc % pred: 63 %
FEF 25-75 POST: 3.13 L/s
FEF 25-75 PRE: 3.55 L/s
FEF2575-%Change-Post: -11 %
FEF2575-%Pred-Post: 151 %
FEF2575-%Pred-Pre: 171 %
FEV1-%CHANGE-POST: -5 %
FEV1-%PRED-PRE: 125 %
FEV1-%Pred-Post: 119 %
FEV1-POST: 3.44 L
FEV1-Pre: 3.62 L
FEV1FVC-%Change-Post: -6 %
FEV1FVC-%PRED-PRE: 109 %
FEV6-%CHANGE-POST: 1 %
FEV6-%PRED-POST: 124 %
FEV6-%PRED-PRE: 122 %
FEV6-PRE: 4.59 L
FEV6-Post: 4.67 L
FEV6FVC-%PRED-POST: 106 %
FEV6FVC-%PRED-PRE: 106 %
FVC-%Change-Post: 1 %
FVC-%Pred-Post: 116 %
FVC-%Pred-Pre: 114 %
FVC-PRE: 4.59 L
FVC-Post: 4.67 L
POST FEV6/FVC RATIO: 100 %
PRE FEV6/FVC RATIO: 100 %
Post FEV1/FVC ratio: 74 %
Pre FEV1/FVC ratio: 79 %
RV % PRED: 144 %
RV: 3.63 L
TLC % pred: 124 %
TLC: 8.49 L

## 2015-02-11 MED ORDER — ALBUTEROL SULFATE (2.5 MG/3ML) 0.083% IN NEBU
2.5000 mg | INHALATION_SOLUTION | Freq: Once | RESPIRATORY_TRACT | Status: AC
  Administered 2015-02-11: 2.5 mg via RESPIRATORY_TRACT

## 2015-02-13 ENCOUNTER — Telehealth: Payer: Self-pay

## 2015-02-13 ENCOUNTER — Ambulatory Visit (INDEPENDENT_AMBULATORY_CARE_PROVIDER_SITE_OTHER): Payer: Medicare Other | Admitting: Gastroenterology

## 2015-02-13 ENCOUNTER — Ambulatory Visit: Payer: Medicare Other | Admitting: Gastroenterology

## 2015-02-13 ENCOUNTER — Encounter: Payer: Self-pay | Admitting: Gastroenterology

## 2015-02-13 VITALS — BP 130/60 | HR 68 | Ht 72.0 in | Wt 184.0 lb

## 2015-02-13 DIAGNOSIS — I4819 Other persistent atrial fibrillation: Secondary | ICD-10-CM

## 2015-02-13 DIAGNOSIS — I481 Persistent atrial fibrillation: Secondary | ICD-10-CM

## 2015-02-13 DIAGNOSIS — K2270 Barrett's esophagus without dysplasia: Secondary | ICD-10-CM

## 2015-02-13 DIAGNOSIS — K5190 Ulcerative colitis, unspecified, without complications: Secondary | ICD-10-CM | POA: Diagnosis not present

## 2015-02-13 DIAGNOSIS — I2510 Atherosclerotic heart disease of native coronary artery without angina pectoris: Secondary | ICD-10-CM | POA: Diagnosis not present

## 2015-02-13 DIAGNOSIS — Z7901 Long term (current) use of anticoagulants: Secondary | ICD-10-CM | POA: Diagnosis not present

## 2015-02-13 NOTE — Patient Instructions (Signed)
You have been scheduled for an endoscopy and colonoscopy. Please follow the written instructions given to you at your visit today. Please pick up your prep supplies at the pharmacy within the next 1-3 days. If you use inhalers (even only as needed), please bring them with you on the day of your procedure.  

## 2015-02-13 NOTE — Progress Notes (Signed)
     02/13/2015 Timothy Mclean [id number] 20-Aug-1940   History of Present Illness:  This is a pleasant 74 year old male known to Dr. Carlean Purl for treatment of his long-standing UC and GERD/Barrett's esophagus.  He is on colazol with remission and good control of symptoms.  Also on pantoprazole 40 mg daily.  No complaints at this time.  Is due for EGD and colonoscopy.  Both procedures were performed in 09/2011.  EGD showed hiatal hernia and irregular Z-line; biopsies showed Barrett's.  Colonoscopy showed sessile polyp at the hepatic flexure; biopsies were benign.  He is on coumadin for several years for chronic atrial fibrillation.  Just saw Dr. Ellyn Hack earlier this month.   Current Medications, Allergies, Past Medical History, Past Surgical History, Family History and Social History were reviewed in [employer] record.   Physical Exam: BP 130/60 mmHg  Pulse 68  Ht 6' (1.829 m)  Wt 184 lb (83.462 kg)  BMI 24.95 kg/m2 General: Well developed white male in no acute distress Head: Normocephalic and atraumatic Eyes:  Sclerae anicteric, conjunctiva pink  Ears: Normal auditory acuity Lungs: Clear throughout to auscultation Heart:  Irregularly irregular. Abdomen: Soft, non-distended.  BS present.  Non-tender. Rectal:  Will be done at the time of colonoscopy. Musculoskeletal: Symmetrical with no gross deformities  Extremities: No edema  Neurological: Alert oriented x 4, grossly non-focal Psychological:  Alert and cooperative. Normal mood and affect  Assessment and Recommendations: -Ulcerative colitis:  Well controlled on Colazol.  Will continue.  Due for surveillance colonoscopy.  Will schedule with Dr. Carlean Purl. -Barrett's esophagus:  Continue pantoprazole 40 mg daily.  Due for EGD recall.  Will schedule with Dr. Carlean Purl. -Chronic atrial fibrillation, requiring long-term anticoagulation with coumadin  *Will hold coumadin for 5 days prior to endoscopic procedures - will  instruct when and how to resume after procedure. Benefits and risks of procedure explained including risks of bleeding, perforation, infection, missed lesions, reactions to medications and possible need for hospitalization and surgery for complications. Additional rare but real risk of stroke or other vascular clotting events off Coumadin also explained and need to seek urgent help if any signs of these problems occur. Will communicate by phone or EMR with patient's  prescribing provider to confirm that holding coumadin is reasonable in this case.  Will contact Dr. Ellyn Hack.

## 2015-02-13 NOTE — Telephone Encounter (Signed)
02/13/2015   RE: Timothy Mclean DOB: Apr 10, 1941 MRN: [id number]   Dear Dr. Ellyn Hack,    We have scheduled the above patient for an endoscopic procedure. Our records show that he is on anticoagulation therapy.   Please advise as to how long the patient may come off his therapy of Warfarin prior to the procedure, which is scheduled for 03/26/2015.  Please fax back/ or route the completed form to [employer] at [phone].   Sincerely,    Margreta Journey

## 2015-02-14 NOTE — Telephone Encounter (Signed)
Called patient left message with wife for patient to call our office back to instruct him on holding warfarin.

## 2015-02-14 NOTE — Telephone Encounter (Signed)
He is fine to stop warfarin 5 days prior to his procedure this would be September 1. He would then restart warfarin without bridging one day post procedure if polyps removed. Otherwise restart the evening of the procedure.  Leonie Man, M.D., M.S. Interventional Cardiologist   Pager # [phone]

## 2015-02-14 NOTE — Telephone Encounter (Signed)
Patient called back. Instructed him on holding warfarin 5 days before his procedure. He understands.

## 2015-02-21 NOTE — Progress Notes (Signed)
Agree with Timothy Mclean's management.  Iantha Titsworth E. Yanis Larin, MD, FACG  

## 2015-02-25 NOTE — Progress Notes (Signed)
Timothy Mclean- I went into the PFT software system and also looked at CT on Mr [place].  He has other pulmonary reasons for reduced Diffusion, including emphysema and some interstitial lung disease. The most common amiodarone xray picture has peripheral densities or interstitial disease with upper lobe predominance. Mr Andujo's interstitial disease is posterior and basilar, with some bronchiectasis. You could ask the radiologists- It may advance to be identified as Usual Interstitial Pneumonia (UIP), but I can't call it yet. I don't favor amiodarone etiology for now. Comparing PFT is hard, because 2015 was done on a different machine. The technical comment from that study was that the patient had difficulty performing the Diffusion maneuver (a timed breath-hold is required), so it may not have been his best capable. I think you can continue your treatment, but watch him. Somebody here at [facility] can see him with you if needed. -Clint Young

## 2015-02-26 ENCOUNTER — Ambulatory Visit (INDEPENDENT_AMBULATORY_CARE_PROVIDER_SITE_OTHER): Payer: Medicare Other | Admitting: Neurology

## 2015-02-26 ENCOUNTER — Encounter: Payer: Self-pay | Admitting: Neurology

## 2015-02-26 VITALS — BP 138/70 | HR 80 | Resp 16 | Ht 72.0 in | Wt 182.0 lb

## 2015-02-26 DIAGNOSIS — I480 Paroxysmal atrial fibrillation: Secondary | ICD-10-CM

## 2015-02-26 DIAGNOSIS — I2510 Atherosclerotic heart disease of native coronary artery without angina pectoris: Secondary | ICD-10-CM | POA: Diagnosis not present

## 2015-02-26 DIAGNOSIS — R413 Other amnesia: Secondary | ICD-10-CM

## 2015-02-26 DIAGNOSIS — R278 Other lack of coordination: Secondary | ICD-10-CM

## 2015-02-26 DIAGNOSIS — R269 Unspecified abnormalities of gait and mobility: Secondary | ICD-10-CM

## 2015-02-26 DIAGNOSIS — R634 Abnormal weight loss: Secondary | ICD-10-CM | POA: Diagnosis not present

## 2015-02-26 DIAGNOSIS — Z9181 History of falling: Secondary | ICD-10-CM

## 2015-02-26 NOTE — Patient Instructions (Signed)
I think your gait disorder is fairly stable, which is reassuring. Overall you are doing fairly well but I do want to suggest a few things today:  Remember to drink plenty of fluid, eat healthy meals and do not skip any meals. Try to eat protein with a every meal and eat a healthy snack such as fruit or nuts in between meals. Try to keep a regular sleep-wake schedule and try to exercise daily, particularly in the form of walking, 20-30 minutes a day, if you can.   Try to stay active physically and mentally. Engage in social activities in your community and with your family and try to keep up with current events by reading the newspaper or watching the news. Try to do word puzzles and you may like to do word puzzles and brain games on the computer such as on [email or web address]   Use your cane for safety.   I would like to see you back in 4 months, sooner if we need to. Please call us with any interim questions, concerns, problems, updates or refill requests.  Our phone number is [phone]. We also have an after hours call service for urgent matters and there is a physician on-call for urgent questions, that cannot wait till the next work day. For any emergencies you know to call 911 or go to the nearest emergency room.

## 2015-02-26 NOTE — Progress Notes (Signed)
Subjective:    Patient ID: Timothy Mclean is a 74 y.o. male.  HPI     Interim history:   Timothy Mclean is a 74 year old right-handed gentleman with an underlying complex medical history of reflux disease, hypertension, chronic lung disease, vitamin D deficiency, vitamin B12 deficiency, thyroid disease, type 2 diabetes, heart disease, status post MI and PTCA in 1987, status post CABG in 1997, paroxysmal atrial fibrillation on Coumadin, gout, BPH, kidney stone, diverticulosis, shingles in 2009, who presents for follow-up consultation of his lower extremity weakness and numbness. He is accompanied by his wife again today. I last saw him on 09/27/2014, at which time he reported doing fairly well. He was treated for fungal infection of his foot by his primary care physician. His wife reported that he had some memory loss and occasional confusion. He had lost some weight. He had a checkup with Dr. Sandi Mariscal on 07/13/2014 and I reviewed his blood test results from that visit: CK was 154, vitamin B12 was 1392, methylmalonic acid was 90, CBC with differential was unremarkable, CMP showed a glucose of 114 and otherwise unremarkable, uric acid normal, CRP normal, TSH elevated at 11, triglycerides 118, LDL within normal. He was not drinking enough water and had no recent falls. He was drinking a lot of [facility]. He had recent teeth extractions. He was maintaining well on Crestor. His exam was stable. I suggested outpatient physical therapy. I referred him to neuro rehabilitation for this. I asked to stay better hydrated.   Today, 02/26/2015: He reports doing fairly well. He has not fallen recently, but bruises easily. He finished outpatient physical therapy on 12/18/2014. I reviewed the discharge summary. He was given home exercises as well. His appetite is stable. His weight has recently been more stable. He has adjusted to his new dentures.  Previously:   I saw him on 05/29/14, at which time he reported taking  B12 orally. He was on Crestor. He saw a spine specialist at [facility], who did a lower spine injection, which he felt helped. He was not on Lescol or lovastatin. He felt a little better with his walking as he was using a cane. He had not fallen. He was using his stationary bike a little bit more regularly. I felt that he had a gait disorder secondary to peripheral neuropathy, degenerative lower spine disease, possibly medication side effects, his hemoglobin A1c was suboptimal in June 2015 at 7.5. He was advised to see his primary care physician for recheck on his A1c and B12 levels. He was taking his B12. I suggested we recheck his CK levels which were done on 05/29/2014. This came back as normal and we advised him of the test results in writing.    I first met him on 12/26/2013 at the request of his primary care physician, at which time the patient reported lower extremity pain, weakness and numbness associated with a history of low back pain since 2012. I suggested further workup in the form of EMG nerve conduction studies to the lower extremities, blood work.  Blood work from 12/26/2013 showed a hemoglobin A1c of 7.5, methylmalonic acid was elevated at 469, ANA was negative, CRP was normal, aldolase was borderline at 10.4, CK was 451. EMG and nerve conduction testing from 01/18/2014 showed: bilateral lumbar radiculopathies, involving bilateral L4, L5, S1 myotomes. In addition, there is evidence of chronic right cervical radiculopathy, involving right C5 nerve roots. There was also evidence of length dependent mild axonal peripheral neuropathy. I called his  wife with the test results. I suggested evaluation with a spine specialist.  He was found to be intolerant to Lipitor generic and was taken off of it. He was switched to pravastatin but this was also subsequently discontinued with some subjective improvement of his symptoms. He had a mild increase in CK at 258 which subsequently returned to normal after  discontinuation of his Zocor. He has known degenerative lower back disease with an MRI showing impingement at L4-5 and L5-S1 level, he has had multiple steroid epidural injections. Because of his vascular risk factors he was started on lovastatin. His last epidural injection was in January 2015 which was not helpful. Lovastatin was discontinued in February 2015. CK level was mildly elevated at 338. His B12 supplement dose was increased recently. He had a CT head without contrast on 01/11/2013 after a fall. This showed generalized atrophy, mild chronic microvascular ischemic changes in the white matter, no acute infarct. He also had a CT cervical spine without contrast at the time which showed moderate degenerative changes, no fracture. MRI lumbar spine without contrast on 08/13/2011 showed lumbar spondylosis and degenerative disease, causing marked impingement at L4-5 and moderate impingement at L5-S1. He was started on Crestor but was initially also taking the Lovastatin 40 mg daily as well. Patient was not taking B12 injections or oral B12.   He has some tingling in the feet. He has been diabetic for about 5 years. He has no significant foot pain. He denies fasciculations and atrophy, and myoclonus. His legs gives out some and he fatigues easily. He has not had recurrent falls, but did take a fall outside at his niece's ballgame, it was raining and the terrain was hilly and slick and rocky. 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 He volunteers at the firestation.   He quit smoking in '86 and does not drink alcohol. There is no FHx muscle or nerve disease.    His Past Medical History Is Significant For: Past Medical History  Diagnosis Date  . Gout, unspecified   . Iron deficiency anemia, unspecified   . Other and unspecified hyperlipidemia   . Esophageal reflux   . Unspecified essential hypertension   . Type II or unspecified type diabetes mellitus without mention of  complication, not stated as uncontrolled   . Ulcerative colitis, unspecified   . Other B-complex deficiencies   . ST elevation myocardial infarction (STEMI) of inferior wall, subsequent episode of care [facility]    a) PTCA of Cx; b) CABG   . CAD S/P percutaneous coronary angioplasty 1987    angioplasty and  STENT- Cx; MYOVIEW 1/'05: ? Ischemia vs. artifact --> sent for cath  . S/P CABG x 4 1997    a) LIMA-LAD, SVG to OM, SVG-dRCA-RPL; b) FALSE + MYOVIEW --> CATH 1/'05: 100% LAD after widelly patent D1, all Cx OM branches 100%, pRCA 100%, SVG-PDA patent w/ retrograde filling of RPL, SVG-OM widely patent ~ normal OM, LIMA-LAD patent.  [name] PAF (paroxysmal atrial fibrillation)     Last reported episode 2012; on amiodarone and anticoagulated on warfarin  . Dyslipidemia, goal LDL below 70     Doing better off of Zocor, currently on Lescol   . GERD (gastroesophageal reflux disease)   . DDD (degenerative disc disease)   . Spinal stenosis   . Barrett's esophagus 03/09/2014  . Personal history of colonic polyps     His Past Surgical History Is Significant For: Past Surgical History  Procedure Laterality Date  . Coronary artery bypass graft  11/1995    LIMA-LAD, SVG to OM, SVG-dRCA-RPL  . Cholecystectomy    . Cardiac catheterization  08/17/2003    "False positive stress test " grafts patent; RCA proximal 100% LAD 100% occlusion after normal D1 with 80% ostial as SP1; circumflex patent but OM1 on to all occluded; EF 50-55%  . Cardiac catheterization  11/1995    Preop CABG: LAD 90% at D1, circumflex 100 and OM a 90% proximal, 80% distal  . Nm myoview ltd  2005    Questionable ischemia that is likely either infarct versus artifact; normal  . Colonoscopy    . Flexible sigmoidoscopy    . Esophagogastroduodenoscopy    . Transthoracic echocardiogram  12/03/2008    LVEF =>55% normal study  . Nm myocar perf ejection fraction  07/03/2003    FALSE POSITIVE TEST; Bruce protocol, negative test with  scintigraphic evidence of inferoapical scar, diaphragmatic attenuation, moderate ischemia.  . Carotid duplex doppler  12/23/2009    Right&Left ICAs 0-49%, mildly abnormal study,     His Family History Is Significant For: Family History  Problem Relation Age of Onset  . Diabetes Mother     His Social History Is Significant For: History   Social History  . Marital Status: Married    Spouse Name: N/A  . Number of Children: 1  . Years of Education: N/A   Occupational History  . retired    Social History Main Topics  . Smoking status: Former Smoker -- 1.00 packs/day for 32 years    Types: Cigarettes    Quit date: 08/27/1984  . Smokeless tobacco: Never Used  . Alcohol Use: No  . Drug Use: No  . Sexual Activity: Not on file   Other Topics Concern  . None   Social History Narrative   He is a married father of one. Does not smoke and does not drink. He walks routinely and also does stationary bike. 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 amiodarone (PACERONE) 200 MG tablet Take 1 tablet (200 mg total) by mouth daily.  [name] aspirin 81 MG tablet Take 81 mg by mouth daily.  . balsalazide (COLAZAL) 750 MG capsule TAKE 2 CAPSULES BY MOUTH TWICE DAILY  . BAYER CONTOUR TEST test strip   . CRESTOR 20 MG tablet Take 1 tablet by mouth daily.  . cyanocobalamin 2000 MCG tablet Take 2,000 mcg by mouth daily.  [name] levothyroxine (SYNTHROID, LEVOTHROID) 75 MCG tablet Take 1 tablet by mouth daily. Take 1 tab daily  . oxybutynin (DITROPAN) 5 MG tablet Take 5 mg by mouth 3 (three) times daily.   . pantoprazole (PROTONIX) 40 MG tablet Take 1 tablet by mouth daily.  [name] triamcinolone cream (KENALOG) 0.1 % Apply 1 application topically daily.  . verapamil  (CALAN-SR) 120 MG CR tablet Take 1 tablet (120 mg total) by mouth daily.  . vitamin D, CHOLECALCIFEROL, 400 UNITS tablet Take 400 Units by mouth daily.  [name] warfarin (COUMADIN) 5 MG tablet Take 1 tablet by mouth daily or as directed   No facility-administered encounter medications on file as of 02/26/2015.  :  Review of Systems:  Out of a complete 14 point review of systems, all are reviewed and negative with  the exception of these symptoms as listed below:   Review of Systems  All other systems reviewed and are negative.   Objective:  Neurologic Exam  Physical Exam Physical Examination:   Filed Vitals:   02/26/15 1321  BP: 138/70  Pulse: 80  Resp: 16    General Examination: The patient is a very pleasant 74 y.o. male in no acute distress. He appears well-developed and well-nourished and well groomed. He is overweight. He is in good spirits today. He is situated in his chair.  HEENT: Normocephalic, atraumatic, pupils are equal, round and reactive to light and accommodation. Funduscopic exam is normal with sharp disc margins noted. Extraocular tracking is good without limitation to gaze excursion or nystagmus noted. Normal smooth pursuit is noted. Hearing seems mildly impaired. Face is symmetric with normal facial animation and normal facial sensation. Speech is clear with no dysarthria noted. There is no hypophonia. There is no lip, neck/head, jaw or voice tremor. Neck is supple with full range of passive and active motion. There are no carotid bruits on auscultation. Oropharynx exam reveals: mild to moderate mouth dryness, adequate dental hygiene with complete dentures in place, and moderate airway crowding, due to large tongue and narrow appearing airway. Mallampati is class III. Tongue protrudes centrally and palate elevates symmetrically.   Chest: Clear to auscultation without wheezing, rhonchi or crackles noted.  Heart: S1+S2+0, regular and normal without murmurs, rubs or gallops  noted.   Abdomen: Soft, non-tender and non-distended with normal bowel sounds appreciated on auscultation.  Extremities: There is trace pitting edema in the distal lower extremities bilaterally. Pedal pulses are intact.  Skin: Warm and dry without trophic changes noted. There are no varicose veins.  Musculoskeletal: exam reveals no obvious joint deformities, tenderness or joint swelling or erythema.   Neurologically:  Mental status: The patient is awake, alert and oriented in all 4 spheres. His immediate and remote memory, attention, language skills and fund of knowledge are fairly appropriate. There is no evidence of aphasia, agnosia, apraxia or anomia. Speech is clear with normal prosody and enunciation. Thought process is linear. Mood is normal and affect is normal.   On 02/26/2015: MMSE: 26/30, he missed 1 point on serial sevens, and 2 points on orientation as well as one point on remote recall. CDT: 4/4, AFT: 9/min.  Cranial nerves II - XII are as described above under HEENT exam. In addition: shoulder shrug is normal with equal shoulder height noted. Motor exam: Normal bulk, strength and tone is noted in the upper extremities. He has mildly decreased bulk in the lower extremities with no fasciculation or focal atrophy noted no trophic changes were noted. He has mild hip flexor weakness right more than left and mild knee flexor weakness on the left. There is no drift, tremor or rebound, with the exception of possible slight left upper extremity rebound. Romberg is positive. Reflexes are 2+ throughout with absent ankle jerks noted, unchanged. Babinski: Toes are flexor bilaterally. Fine motor skills and coordination: intact with normal finger taps, normal hand movements, normal rapid alternating patting, normal foot taps and normal foot agility, but overall mild slowness noted.  Cerebellar testing: No dysmetria or intention tremor on finger to nose testing. Heel to shin is unremarkable, but  slightly slow bilaterally. There is no truncal ataxia.  Sensory exam: intact to light touch, pinprick, vibration, temperature sense in the upper extremities but decrease to pinprick and vibration in the lower extremities distally up to the ankles bilaterally and symmetrical, unchanged.  Gait, station and balance: He stands with mild difficulty. He is not able to stand narrow based and stands wide-based and slightly bowlegged. No veering to one side is noted. No leaning to one side is noted. Posture is age-appropriate. He walks slightly wide-based. He is not able to perform tandem walk. He uses a cane and appears to be more stable with the cane.   Assessment and Plan:   In summary, Timothy Mclean is a very pleasant 74 year old male with an underlying complex medical history of reflux disease, hypertension, chronic lung disease, vitamin D deficiency, vitamin B12 deficiency, thyroid disease, type 2 diabetes, heart disease, status post MI and PTCA in 1987, status post CABG in 1997, paroxysmal atrial fibrillation (on Coumadin), gout, BPH, kidney stone, diverticulosis, shingles in 2009, who has a three-year history of progressive muscle weakness and gait disorder. His gait disorder most likely is multifactorial, secondary to peripheral neuropathy, degenerative lower spine disease, mild muscle weakness. Workup has shown mild axonal neuropathy, most likely diabetic in etiology, and CK level in the past was elevated in the 400s. He was on Crestor and there was some confusion with his medications in the past. He continues to be on Crestor, and CK levels improved. He sees Dr. Ellyn Hack in cardiology. I encouraged him to monitor his weight. He is again advised to limit his soda intake and increase his water intake. He is advised to continue using his cane and he finished outpatient PT in 5/16, which he felt was helpful. He is advised to continue to stay active mentally and physically. As far as his memory loss, we will  continue to monitor. He is able to give a reasonably good history. Memory loss can be secondary to dehydration, medication side effects, aging, and of course also secondary to dementia. He is certainly at risk for vascular dementia but at this point we will continue to monitor his symptoms. Again, hydration, staying physically and mentally active will help. Good nutrition will help as well. At this juncture, I would like to see him back in 6 months, sooner if the need arises. I answered all her questions today and the patient and his wife were in agreement. I spent 25 minutes in total face-to-face time with the patient, more than 50% of which was spent in counseling and coordination of care, reviewing test results, reviewing medication and discussing or reviewing the diagnosis of gait disorder, memory loss, the prognosis and treatment options.

## 2015-02-27 ENCOUNTER — Ambulatory Visit (INDEPENDENT_AMBULATORY_CARE_PROVIDER_SITE_OTHER): Payer: Medicare Other | Admitting: Pharmacist Clinician (PhC)/ Clinical Pharmacy Specialist

## 2015-02-27 DIAGNOSIS — I4891 Unspecified atrial fibrillation: Secondary | ICD-10-CM

## 2015-02-27 DIAGNOSIS — Z7901 Long term (current) use of anticoagulants: Secondary | ICD-10-CM

## 2015-02-27 LAB — POCT INR: INR: 2.4

## 2015-03-26 ENCOUNTER — Encounter: Payer: Self-pay | Admitting: Internal Medicine

## 2015-03-26 ENCOUNTER — Ambulatory Visit (AMBULATORY_SURGERY_CENTER): Payer: Medicare Other | Admitting: Internal Medicine

## 2015-03-26 VITALS — BP 108/68 | HR 64 | Temp 98.2°F | Resp 13 | Ht 72.0 in | Wt 184.0 lb

## 2015-03-26 DIAGNOSIS — K5190 Ulcerative colitis, unspecified, without complications: Secondary | ICD-10-CM | POA: Diagnosis present

## 2015-03-26 DIAGNOSIS — K2270 Barrett's esophagus without dysplasia: Secondary | ICD-10-CM | POA: Diagnosis not present

## 2015-03-26 LAB — GLUCOSE, CAPILLARY: Glucose-Capillary: 120 mg/dL — ABNORMAL HIGH (ref 65–99)

## 2015-03-26 MED ORDER — SODIUM CHLORIDE 0.9 % IV SOLN: 500.0000 mL | INTRAVENOUS | Status: DC

## 2015-03-26 NOTE — Progress Notes (Signed)
Called to room to assist during endoscopic procedure.  Patient ID and intended procedure confirmed with present staff. Received instructions for my participation in the procedure from the performing physician.  

## 2015-03-26 NOTE — Progress Notes (Signed)
Have sign hanging on IV pool for anticoagulant and fall risk.  Pt's cane is under the stretcher.  A lable is on his cane. maw

## 2015-03-26 NOTE — Progress Notes (Signed)
Transferred to recovery room. A/O x3, pleased with MAC.  VSS.  Report to Celia, RN. 

## 2015-03-26 NOTE — Op Note (Signed)
[place]  [address] [place], [address]   COLONOSCOPY PROCEDURE REPORT  PATIENT: Timothy Mclean, Timothy Mclean  MR#: [id number] BIRTHDATE: Sep 02, 1940 , 72  yrs. old GENDER: male ENDOSCOPIST: Gatha Mayer, MD, [facility] PROCEDURE DATE:  03/26/2015 PROCEDURE:   Colonoscopy, surveillance and Colonoscopy with biopsy First Screening Colonoscopy - Avg.  risk and is 50 yrs.  old or older - No.  Prior Negative Screening - Now for repeat screening. N/A  History of Adenoma - Now for follow-up colonoscopy & has been > or = to 3 yrs.  N/A  Polyps removed today? No Recommend repeat exam, <10 yrs? Yes high risk ASA CLASS:   Class II INDICATIONS:Inflammatory bowel disease of the intestine if more precise diagnosis or determination of the extent / severity of activity of disease will influence immediate / future management and Inflammatory Bowel Disease ( = 8 years pancolitis or = 15 years left sided colitis). MEDICATIONS: Propofol 230 mg IV and Monitored anesthesia care  DESCRIPTION OF PROCEDURE:   After the risks benefits and alternatives of the procedure were thoroughly explained, informed consent was obtained.  The digital rectal exam revealed no abnormalities of the rectum, revealed no prostatic nodules, and revealed the prostate was not enlarged.   The LB [id number] endoscope was introduced through the anus and advanced to the cecum, which was identified by both the appendix and ileocecal valve. No adverse events experienced.   The quality of the prep was excellent.  (MiraLax was used)  The instrument was then slowly withdrawn as the colon was fully examined. Estimated blood loss is zero unless otherwise noted in this procedure report.  COLON FINDINGS: The colonic mucosa appeared normal throughout the entire examined colon.  Multiple random biopsies were performed using cold forceps.  R/o colitis and dysplasia.  Sample was obtained and sent to histology.  Retroflexed views  revealed no abnormalities. The time to cecum = 9.5 Withdrawal time = 10.1   The scope was withdrawn and the procedure completed. COMPLICATIONS: There were no immediate complications.  ENDOSCOPIC IMPRESSION: The colonic mucosa appeared normal throughout the entire examined colon; multiple random biopsies were performed using cold forceps - ulcerative colitis in remission and no suspicious changes seen  RECOMMENDATIONS: Await biopsy results  at his age and with colitis in remission may not need further routine colonoscopy  eSigned:  Gatha Mayer, MD, [facility] 03/26/2015 4:50 PM   cc: The Patient and Dr. Derinda Late

## 2015-03-26 NOTE — Op Note (Signed)
[place]  [address] [place], [address]   ENDOSCOPY PROCEDURE REPORT  PATIENT: Timothy Mclean, Timothy Mclean  MR#: [id number] BIRTHDATE: 04-22-1941 , 47  yrs. old GENDER: male ENDOSCOPIST: Gatha Mayer, MD, [facility] PROCEDURE DATE:  03/26/2015 PROCEDURE:  Esophagoscopy w/ biopsies ASA CLASS:     Class II INDICATIONS:  F/u Barrett's esophagus. MEDICATIONS: Propofol 170 mg IV and Monitored anesthesia care TOPICAL ANESTHETIC: none  DESCRIPTION OF PROCEDURE: After the risks benefits and alternatives of the procedure were thoroughly explained, informed consent was obtained.  The LB [id number] and LB [id number] endoscope was introduced through the mouth and advanced to the stomach antrum , Without limitations.  The instrument was slowly withdrawn as the mucosa was fully examined.    1) 2x 3 cm tongue of known Barrett's in distal esohagus - biopsies taken. 2) Otherwise normal esophagus and stomach.  Retroflexed views revealed no abnormalities.     The scope was then withdrawn from the patient and the procedure completed.  COMPLICATIONS: There were no immediate complications.  ENDOSCOPIC IMPRESSION: 1) 2x 3 cm tongue of known Barrett's in distal esohagus - biopsies taken. 2) Otherwise normal esophagus and stomach  RECOMMENDATIONS: 1.  Await pathology results 2.  Proceed with a Colonoscopy.    eSigned:  Gatha Mayer, MD, [facility] 03/26/2015 4:45 PM    CC: The Patient       and Dr. Derinda Late

## 2015-03-26 NOTE — Patient Instructions (Addendum)
I did not see anything suspicious. Barrett's looks ok - biopsied. Colon is normal - no colitis seen - standard surveillance biopsies taken.  I appreciate the opportunity to care for you. Gatha Mayer, MD, [facility]   Discharge instructions given. Biopsies taken. Resume previous medications. Take 2 Coumadin 5mg  tablets tonight. Resume Coumadin check tomorrow. YOU HAD AN ENDOSCOPIC PROCEDURE TODAY AT [facility]:   Refer to the procedure report that was given to you for any specific questions about what was found during the examination.  If the procedure report does not answer your questions, please call your gastroenterologist to clarify.  If you requested that your care partner not be given the details of your procedure findings, then the procedure report has been included in a sealed envelope for you to review at your convenience later.  YOU SHOULD EXPECT: Some feelings of bloating in the abdomen. Passage of more gas than usual.  Walking can help get rid of the air that was put into your GI tract during the procedure and reduce the bloating. If you had a lower endoscopy (such as a colonoscopy or flexible sigmoidoscopy) you may notice spotting of blood in your stool or on the toilet paper. If you underwent a bowel prep for your procedure, you may not have a normal bowel movement for a few days.  Please Note:  You might notice some irritation and congestion in your nose or some drainage.  This is from the oxygen used during your procedure.  There is no need for concern and it should clear up in a day or so.  SYMPTOMS TO REPORT IMMEDIATELY:   Following lower endoscopy (colonoscopy or flexible sigmoidoscopy):  Excessive amounts of blood in the stool  Significant tenderness or worsening of abdominal pains  Swelling of the abdomen that is new, acute  Fever of 100F or higher   Following upper endoscopy (EGD)  Vomiting of blood or coffee ground material  New chest pain  or pain under the shoulder blades  Painful or persistently difficult swallowing  New shortness of breath  Fever of 100F or higher  Black, tarry-looking stools  For urgent or emergent issues, a gastroenterologist can be reached at any hour by calling [phone].   DIET: Your first meal following the procedure should be a small meal and then it is ok to progress to your normal diet. Heavy or fried foods are harder to digest and may make you feel nauseous or bloated.  Likewise, meals heavy in dairy and vegetables can increase bloating.  Drink plenty of fluids but you should avoid alcoholic beverages for 24 hours.  ACTIVITY:  You should plan to take it easy for the rest of today and you should NOT DRIVE or use heavy machinery until tomorrow (because of the sedation medicines used during the test).    FOLLOW UP: Our staff will call the number listed on your records the next business day following your procedure to check on you and address any questions or concerns that you may have regarding the information given to you following your procedure. If we do not reach you, we will leave a message.  However, if you are feeling well and you are not experiencing any problems, there is no need to return our call.  We will assume that you have returned to your regular daily activities without incident.  If any biopsies were taken you will be contacted by phone or by letter within the next 1-3 weeks.  Please call us at [phone] if you have not heard about the biopsies in 3 weeks.    SIGNATURES/CONFIDENTIALITY: You and/or your care partner have signed paperwork which will be entered into your electronic medical record.  These signatures attest to the fact that that the information above on your After Visit Summary has been reviewed and is understood.  Full responsibility of the confidentiality of this discharge information lies with you and/or your care-partner.

## 2015-03-27 ENCOUNTER — Encounter: Payer: Medicare Other | Admitting: Pharmacist Clinician (PhC)/ Clinical Pharmacy Specialist

## 2015-03-27 ENCOUNTER — Telehealth: Payer: Self-pay | Admitting: *Deleted

## 2015-03-27 DIAGNOSIS — I4891 Unspecified atrial fibrillation: Secondary | ICD-10-CM

## 2015-03-27 DIAGNOSIS — Z7901 Long term (current) use of anticoagulants: Secondary | ICD-10-CM

## 2015-03-27 NOTE — Progress Notes (Signed)
This encounter was created in error - please disregard.

## 2015-03-27 NOTE — Telephone Encounter (Signed)
  Follow up Call-  Call back number 03/26/2015  Post procedure Call Back phone  # [phone] hm  Permission to leave phone message Yes     Phone # rang with no answer or VM.

## 2015-04-02 ENCOUNTER — Other Ambulatory Visit: Payer: Self-pay | Admitting: Internal Medicine

## 2015-04-02 ENCOUNTER — Encounter: Payer: Self-pay | Admitting: Internal Medicine

## 2015-04-02 NOTE — Telephone Encounter (Signed)
Refill x 1 year 

## 2015-04-02 NOTE — Telephone Encounter (Signed)
Saw recent procedures, any change in this rx Sir? Ok to refill for a year?

## 2015-04-02 NOTE — Progress Notes (Signed)
Quick Note:  1) colitis in remission  2) Barrett's still - no dysplasia OV return 1 year and will discuss if he should repeat screening/surveillance exams again - earliest colonoscopy routine I think in 2019 and EGD could go to 2021 ______

## 2015-04-10 ENCOUNTER — Ambulatory Visit (INDEPENDENT_AMBULATORY_CARE_PROVIDER_SITE_OTHER): Payer: Medicare Other | Admitting: Pharmacist Clinician (PhC)/ Clinical Pharmacy Specialist

## 2015-04-10 DIAGNOSIS — Z7901 Long term (current) use of anticoagulants: Secondary | ICD-10-CM

## 2015-04-10 DIAGNOSIS — I4891 Unspecified atrial fibrillation: Secondary | ICD-10-CM | POA: Diagnosis not present

## 2015-04-10 LAB — POCT INR: INR: 2.4

## 2015-05-07 ENCOUNTER — Ambulatory Visit (INDEPENDENT_AMBULATORY_CARE_PROVIDER_SITE_OTHER): Payer: Medicare Other | Admitting: Pharmacist Clinician (PhC)/ Clinical Pharmacy Specialist

## 2015-05-07 DIAGNOSIS — I4891 Unspecified atrial fibrillation: Secondary | ICD-10-CM

## 2015-05-07 DIAGNOSIS — Z7901 Long term (current) use of anticoagulants: Secondary | ICD-10-CM | POA: Diagnosis not present

## 2015-05-07 LAB — POCT INR: INR: 2.7

## 2015-05-10 ENCOUNTER — Telehealth: Payer: Self-pay | Admitting: *Deleted

## 2015-05-10 NOTE — Telephone Encounter (Signed)
Open error 

## 2015-06-19 ENCOUNTER — Ambulatory Visit (INDEPENDENT_AMBULATORY_CARE_PROVIDER_SITE_OTHER): Payer: Medicare Other | Admitting: Pharmacist Clinician (PhC)/ Clinical Pharmacy Specialist

## 2015-06-19 DIAGNOSIS — I4891 Unspecified atrial fibrillation: Secondary | ICD-10-CM

## 2015-06-19 DIAGNOSIS — Z7901 Long term (current) use of anticoagulants: Secondary | ICD-10-CM | POA: Diagnosis not present

## 2015-06-19 LAB — POCT INR: INR: 2.4

## 2015-07-31 ENCOUNTER — Ambulatory Visit (INDEPENDENT_AMBULATORY_CARE_PROVIDER_SITE_OTHER): Payer: PPO | Admitting: Pharmacist Clinician (PhC)/ Clinical Pharmacy Specialist

## 2015-07-31 DIAGNOSIS — Z7901 Long term (current) use of anticoagulants: Secondary | ICD-10-CM

## 2015-07-31 DIAGNOSIS — Z23 Encounter for immunization: Secondary | ICD-10-CM | POA: Diagnosis not present

## 2015-07-31 LAB — POCT INR: INR: 3.3

## 2015-08-21 ENCOUNTER — Ambulatory Visit (INDEPENDENT_AMBULATORY_CARE_PROVIDER_SITE_OTHER): Payer: PPO | Admitting: Pharmacist Clinician (PhC)/ Clinical Pharmacy Specialist

## 2015-08-21 DIAGNOSIS — Z7901 Long term (current) use of anticoagulants: Secondary | ICD-10-CM | POA: Diagnosis not present

## 2015-08-21 DIAGNOSIS — I4891 Unspecified atrial fibrillation: Secondary | ICD-10-CM | POA: Diagnosis not present

## 2015-08-21 LAB — POCT INR: INR: 2

## 2015-08-29 ENCOUNTER — Ambulatory Visit (INDEPENDENT_AMBULATORY_CARE_PROVIDER_SITE_OTHER): Payer: PPO | Admitting: Neurology

## 2015-08-29 ENCOUNTER — Encounter: Payer: Self-pay | Admitting: Neurology

## 2015-08-29 VITALS — BP 147/73 | HR 70 | Resp 16 | Ht 72.0 in | Wt 180.0 lb

## 2015-08-29 DIAGNOSIS — R278 Other lack of coordination: Secondary | ICD-10-CM

## 2015-08-29 DIAGNOSIS — R2689 Other abnormalities of gait and mobility: Secondary | ICD-10-CM | POA: Diagnosis not present

## 2015-08-29 DIAGNOSIS — Z9181 History of falling: Secondary | ICD-10-CM

## 2015-08-29 DIAGNOSIS — R413 Other amnesia: Secondary | ICD-10-CM | POA: Diagnosis not present

## 2015-08-29 NOTE — Progress Notes (Signed)
Subjective:    Patient ID: Timothy Mclean is a 75 y.o. male.  HPI     Interim history:   Timothy Mclean is a 75 year old right-handed gentleman with an underlying complex medical history of reflux disease, hypertension, chronic lung disease, vitamin D deficiency, vitamin B12 deficiency, thyroid disease, type 2 diabetes, heart disease, status post MI and PTCA in 1987, status post CABG in 1997, paroxysmal atrial fibrillation on Coumadin, gout, BPH, kidney stone, diverticulosis, shingles in 2009, who presents for follow-up consultation of his lower extremity weakness and numbness and gait d/o. He is accompanied by his wife again today.  I last saw him on 02/26/2015, at which time he reported doing fairly well. He had not fallen recently. He finished outpatient physical therapy on 12/18/2014. I reviewed the discharge summary at the time. He was given home exercises as well. His appetite was stable. His weight had been more stable. He has adjusted to his new dentures. His MMSE was 26/30 (missed 1 point on serial sevens, 2 points on orientation, 1 point on remote recall), CDT: 4/4, AFT: 9/min. I asked him to stay active mentally and physically, drink plenty of water, make sure he eats regularly and nutritious food and that he use a cane for safety.  Today, 08/29/2015: He reports doing okay, has had a right sided hearing aid for about 6 weeks, which has helped. He has not fallen, tolerates Crestor and diabetes numbers okay, per wife. He still likes the diet [facility], but has reduced it. He uses a cane outside. Drinks one cup of coffee in the morning. His wife believes, that his memory has been stable, maybe a little better even. He watches the news and reads the paper daily.      Previously:   I saw him on 09/27/2014, at which time he reported doing fairly well. He was treated for fungal infection of his foot by his primary care physician. His wife reported that he had some memory loss and occasional  confusion. He had lost some weight. He had a checkup with Dr. Sandi Mariscal on 07/13/2014 and I reviewed his blood test results from that visit: CK was 154, vitamin B12 was 1392, methylmalonic acid was 90, CBC with differential was unremarkable, CMP showed a glucose of 114 and otherwise unremarkable, uric acid normal, CRP normal, TSH elevated at 11, triglycerides 118, LDL within normal. He was not drinking enough water and had no recent falls. He was drinking a lot of [facility]. He had recent teeth extractions. He was maintaining well on Crestor. His exam was stable. I suggested outpatient physical therapy. I referred him to neuro rehabilitation for this. I asked to stay better hydrated.   I saw him on 05/29/14, at which time he reported taking B12 orally. He was on Crestor. He saw a spine specialist at [facility], who did a lower spine injection, which he felt helped. He was not on Lescol or lovastatin. He felt a little better with his walking as he was using a cane. He had not fallen. He was using his stationary bike a little bit more regularly. I felt that he had a gait disorder secondary to peripheral neuropathy, degenerative lower spine disease, possibly medication side effects, his hemoglobin A1c was suboptimal in June 2015 at 7.5. He was advised to see his primary care physician for recheck on his A1c and B12 levels. He was taking his B12. I suggested we recheck his CK levels which were done on 05/29/2014. This came back as  normal and we advised him of the test results in writing.     I first met him on 12/26/2013 at the request of his primary care physician, at which time the patient reported lower extremity pain, weakness and numbness associated with a history of low back pain since 2012. I suggested further workup in the form of EMG nerve conduction studies to the lower extremities, blood work.  Blood work from 12/26/2013 showed a hemoglobin A1c of 7.5, methylmalonic acid was elevated at 469, ANA was  negative, CRP was normal, aldolase was borderline at 10.4, CK was 451. EMG and nerve conduction testing from 01/18/2014 showed: bilateral lumbar radiculopathies, involving bilateral L4, L5, S1 myotomes. In addition, there is evidence of chronic right cervical radiculopathy, involving right C5 nerve roots. There was also evidence of length dependent mild axonal peripheral neuropathy. I called his wife with the test results. I suggested evaluation with a spine specialist.  He was found to be intolerant to Lipitor generic and was taken off of it. He was switched to pravastatin but this was also subsequently discontinued with some subjective improvement of his symptoms. He had a mild increase in CK at 258 which subsequently returned to normal after discontinuation of his Zocor. He has known degenerative lower back disease with an MRI showing impingement at L4-5 and L5-S1 level, he has had multiple steroid epidural injections. Because of his vascular risk factors he was started on lovastatin. His last epidural injection was in January 2015 which was not helpful. Lovastatin was discontinued in February 2015. CK level was mildly elevated at 338. His B12 supplement dose was increased recently. He had a CT head without contrast on 01/11/2013 after a fall. This showed generalized atrophy, mild chronic microvascular ischemic changes in the white matter, no acute infarct. He also had a CT cervical spine without contrast at the time which showed moderate degenerative changes, no fracture. MRI lumbar spine without contrast on 08/13/2011 showed lumbar spondylosis and degenerative disease, causing marked impingement at L4-5 and moderate impingement at L5-S1. He was started on Crestor but was initially also taking the Lovastatin 40 mg daily as well. Patient was not taking B12 injections or oral B12.   He has some tingling in the feet. He has been diabetic for about 5 years. He has no significant foot pain. He denies  fasciculations and atrophy, and myoclonus. His legs gives out some and he fatigues easily. He has not had recurrent falls, but did take a fall outside at his niece's ballgame, it was raining and the terrain was hilly and slick and rocky. 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 He volunteers at the firestation.   He quit smoking in '86 and does not drink alcohol. There is no FHx muscle or nerve disease.    His Past Medical History Is Significant For: Past Medical History  Diagnosis Date  . Gout, unspecified   . Iron deficiency anemia, unspecified   . Other and unspecified hyperlipidemia   . Esophageal reflux   . Unspecified essential hypertension   . Ulcerative colitis, unspecified   . Other B-complex deficiencies   . ST elevation myocardial infarction (STEMI) of inferior wall, subsequent episode of care ([place]) 1987, 1997    a) PTCA of Cx; b) CABG   . CAD S/P percutaneous coronary angioplasty 1987    angioplasty and  STENT- Cx; MYOVIEW 1/'05: ? Ischemia vs. artifact --> sent for cath  . S/P CABG x 4 1997  a) LIMA-LAD, SVG to OM, SVG-dRCA-RPL; b) FALSE + MYOVIEW --> CATH 1/'05: 100% LAD after widelly patent D1, all Cx OM branches 100%, pRCA 100%, SVG-PDA patent w/ retrograde filling of RPL, SVG-OM widely patent ~ normal OM, LIMA-LAD patent.  [name] PAF (paroxysmal atrial fibrillation) ([place])     Last reported episode 2012; on amiodarone and anticoagulated on warfarin  . Dyslipidemia, goal LDL below 70     Doing better off of Zocor, currently on Lescol   . GERD (gastroesophageal reflux disease)   . DDD (degenerative disc disease)   . Spinal stenosis   . Barrett's esophagus 03/09/2014  . Personal history of colonic polyps   . Type II or unspecified type diabetes mellitus without mention of complication, not stated as uncontrolled     no meds  . Allergy   . Anxiety     His Past Surgical History Is Significant For: Past Surgical History  Procedure Laterality  Date  . Coronary artery bypass graft  11/1995    LIMA-LAD, SVG to OM, SVG-dRCA-RPL  . Cholecystectomy    . Cardiac catheterization  08/17/2003    "False positive stress test " grafts patent; RCA proximal 100% LAD 100% occlusion after normal D1 with 80% ostial as SP1; circumflex patent but OM1 on to all occluded; EF 50-55%  . Cardiac catheterization  11/1995    Preop CABG: LAD 90% at D1, circumflex 100 and OM a 90% proximal, 80% distal  . Nm myoview ltd  2005    Questionable ischemia that is likely either infarct versus artifact; normal  . Colonoscopy    . Flexible sigmoidoscopy    . Esophagogastroduodenoscopy    . Transthoracic echocardiogram  12/03/2008    LVEF =>55% normal study  . Nm myocar perf ejection fraction  07/03/2003    FALSE POSITIVE TEST; Bruce protocol, negative test with scintigraphic evidence of inferoapical scar, diaphragmatic attenuation, moderate ischemia.  . Carotid duplex doppler  12/23/2009    Right&Left ICAs 0-49%, mildly abnormal study,     His Family History Is Significant For: Family History  Problem Relation Age of Onset  . Diabetes Mother   . Colon cancer Neg Hx   . Esophageal cancer Neg Hx   . Rectal cancer Neg Hx   . Stomach cancer Neg Hx     His Social History Is Significant For: Social History   Social History  . Marital Status: Married    Spouse Name: N/A  . Number of Children: 1  . Years of Education: N/A   Occupational History  . retired    Social History Main Topics  . Smoking status: Former Smoker -- 1.00 packs/day for 32 years    Types: Cigarettes    Quit date: 08/27/1984  . Smokeless tobacco: Never Used  . Alcohol Use: No  . Drug Use: No  . Sexual Activity: Not Asked   Other Topics Concern  . None   Social History Narrative   He is a married father of one. Does not smoke and does not drink. He walks routinely and also does stationary bike. 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 amiodarone (PACERONE) 200 MG tablet Take 1 tablet (200 mg total) by mouth daily.  [name] aspirin 81 MG tablet Take 81 mg by mouth  daily.  . balsalazide (COLAZAL) 750 MG capsule TAKE 2 CAPSULES BY MOUTH TWICE A DAY  . BAYER CONTOUR TEST test strip   . CRESTOR 20 MG tablet Take 1 tablet by mouth daily.  . cyanocobalamin 2000 MCG tablet Take 2,000 mcg by mouth daily.  [name] levothyroxine (SYNTHROID, LEVOTHROID) 75 MCG tablet Take 1 tablet by mouth daily. Take 1 tab daily  . oxybutynin (DITROPAN) 5 MG tablet Take 5 mg by mouth 2 (two) times daily.   . pantoprazole (PROTONIX) 40 MG tablet Take 1 tablet by mouth daily.  [name] triamcinolone cream (KENALOG) 0.1 % Apply 1 application topically daily.  . verapamil (CALAN-SR) 120 MG CR tablet Take 1 tablet (120 mg total) by mouth daily.  . vitamin D, CHOLECALCIFEROL, 400 UNITS tablet Take 400 Units by mouth daily.  [name] warfarin (COUMADIN) 5 MG tablet Take 1 tablet by mouth daily or as directed   No facility-administered encounter medications on file as of 08/29/2015.  :  Review of Systems:  Out of a complete 14 point review of systems, all are reviewed and negative with the exception of these symptoms as listed below:   Review of Systems  Neurological:       No new problems or concerns.    Objective:  Neurologic Exam  Physical Exam Physical Examination:   Filed Vitals:   08/29/15 1257  BP: 147/73  Pulse: 70  Resp: 16   General Examination: The patient is a very pleasant 75 y.o. male in no acute distress. He appears well-developed and well-nourished and well groomed. He has lost more weight, but not drastic. He is in good spirits today. He is situated in his chair.  HEENT: Normocephalic, atraumatic, pupils are equal, round and reactive to light  and accommodation. Funduscopic exam is normal with sharp disc margins noted. Extraocular tracking is good without limitation to gaze excursion or nystagmus noted. Normal smooth pursuit is noted. Hearing is mildly impaired, but better with right hearing aid in place. Face is symmetric with normal facial animation and normal facial sensation. Speech is clear with no dysarthria noted. There is no hypophonia. There is no lip, neck/head, jaw or voice tremor. Neck is supple with full range of passive and active motion. There are no carotid bruits on auscultation. Oropharynx exam reveals: mild to moderate mouth dryness, adequate dental hygiene with complete dentures in place, and moderate airway crowding, due to large tongue and narrow appearing airway. Mallampati is class III. Tongue protrudes centrally and palate elevates symmetrically.   Chest: Clear to auscultation without wheezing, rhonchi or crackles noted.  Heart: S1+S2+0, regular and normal without murmurs, rubs or gallops noted.   Abdomen: Soft, non-tender and non-distended with normal bowel sounds appreciated on auscultation.  Extremities: There is no pitting edema in the distal lower extremities bilaterally. Pedal pulses are intact.  Skin: Warm and dry without trophic changes noted. There are no varicose veins.  Musculoskeletal: exam reveals no obvious joint deformities, tenderness or joint swelling or erythema.   Neurologically:  Mental status: The patient is awake, alert and oriented in all 4 spheres. His immediate and remote memory, attention, language skills and fund of knowledge are fairly appropriate. There is no evidence of aphasia, agnosia, apraxia or anomia. Speech is clear with normal prosody and enunciation. Thought process is linear. Mood is normal and affect is normal.   On 02/26/2015: MMSE: 26/30, he missed 1 point on serial sevens, and 2 points on orientation as well as one point on remote recall. CDT:  4/4, AFT: 9/min.  On  08/29/2015: MMSE: 26/30, CDT: 4/4, AFT: 10/min.   Cranial nerves II - XII are as described above under HEENT exam. In addition: shoulder shrug is normal with equal shoulder height noted. Motor exam: Normal bulk, strength and tone is noted in the upper extremities. He has mildly decreased bulk in the lower extremities with no fasciculation or focal atrophy noted no trophic changes were noted. He has mild hip flexor weakness right more than left and mild knee flexor weakness on the left. This appears to be better than last time. There is no drift, tremor or rebound, with the exception of possible slight left upper extremity rebound. Romberg shows mild sway. Reflexes are 1+ throughout with absent ankle jerks noted, unchanged. Fine motor skills and coordination: intact with normal finger taps, normal hand movements, normal rapid alternating patting, normal foot taps and normal foot agility, but overall mild slowness noted.  Cerebellar testing: No dysmetria or intention tremor on finger to nose testing. Heel to shin is unremarkable, but slightly slow bilaterally. There is no truncal ataxia.  Sensory exam: stable decrease in pinprick and vibration sense in the lower extremities distally.  Gait, station and balance: He stands with mild difficulty. He is not able to stand narrow based and stands mildly wide-based and slightly bowlegged, unchanged. No veering to one side is noted. No leaning to one side is noted. Posture is age-appropriate. He walks slightly wide-based. He is not able to perform tandem walk. He uses a cane and is more stable with the cane.   Assessment and Plan:   In summary, Timothy Mclean is a very pleasant 75 year old male with an underlying complex medical history of reflux disease, hypertension, chronic lung disease, vitamin D deficiency, vitamin B12 deficiency, thyroid disease, type 2 diabetes, heart disease, status post MI and PTCA in 1987, status post CABG in 1997, paroxysmal atrial  fibrillation (on Coumadin), gout, BPH, kidney stone, diverticulosis, shingles in 2009, who presents for follow-up consultation of his gait disorder. This is most likely multifactorial in etiology, secondary to degenerative spine disease, peripheral neuropathy, and mild lower extremity weakness which appears to have improved to some degree. Workup showed mild axonal neuropathy, likely diabetic in nature, CK levels were elevated in the past but normal recently. He has been on Crestor 20 mg with good tolerance. He's had some weight loss but nothing drastic or significantly progressive. He is advised to monitor this. He is advised to continue using his cane for safety. He is advised to stay well-hydrated and active physically and mentally. He has a stationary bike which he uses. His memory scores have been stable, we will continue to monitor. I think having the hearing aid has helped as well. I would like to see him back in 6 months, sooner if the need arises. I answered all their questions today and the patient and his wife were in agreement. I spent 25 minutes in total face-to-face time with the patient, more than 50% of which was spent in counseling and coordination of care, reviewing test results, reviewing medication and discussing or reviewing the diagnosis of gait disorder, memory loss, the prognosis and treatment options.

## 2015-08-29 NOTE — Patient Instructions (Signed)
I don't think we need to do any blood work or testing at this time.  Drink enough water, about 6 glasses per day, use your cane for safety.  Your memory scores have been stable. We will continue to monitor.  Follow up in 6 months.

## 2015-09-05 DIAGNOSIS — R740 Nonspecific elevation of levels of transaminase and lactic acid dehydrogenase [LDH]: Secondary | ICD-10-CM | POA: Diagnosis not present

## 2015-09-11 ENCOUNTER — Encounter: Payer: Self-pay | Admitting: Gastroenterology

## 2015-09-18 ENCOUNTER — Ambulatory Visit (INDEPENDENT_AMBULATORY_CARE_PROVIDER_SITE_OTHER): Payer: PPO | Admitting: Pharmacist Clinician (PhC)/ Clinical Pharmacy Specialist

## 2015-09-18 DIAGNOSIS — I4891 Unspecified atrial fibrillation: Secondary | ICD-10-CM

## 2015-09-18 DIAGNOSIS — Z7901 Long term (current) use of anticoagulants: Secondary | ICD-10-CM

## 2015-09-18 LAB — POCT INR: INR: 3.3

## 2015-10-11 ENCOUNTER — Other Ambulatory Visit: Payer: Self-pay | Admitting: *Deleted

## 2015-10-11 MED ORDER — AMIODARONE HCL 200 MG PO TABS: 200.0000 mg | ORAL_TABLET | Freq: Every day | ORAL | Status: DC

## 2015-10-11 NOTE — Telephone Encounter (Signed)
Refill line msg.  Refill for amiodarone sent at pt request.

## 2015-10-15 ENCOUNTER — Ambulatory Visit (INDEPENDENT_AMBULATORY_CARE_PROVIDER_SITE_OTHER): Payer: PPO | Admitting: Pharmacist Clinician (PhC)/ Clinical Pharmacy Specialist

## 2015-10-15 DIAGNOSIS — I4891 Unspecified atrial fibrillation: Secondary | ICD-10-CM | POA: Diagnosis not present

## 2015-10-15 DIAGNOSIS — Z7901 Long term (current) use of anticoagulants: Secondary | ICD-10-CM

## 2015-10-15 LAB — POCT INR: INR: 5.2

## 2015-10-25 ENCOUNTER — Ambulatory Visit (INDEPENDENT_AMBULATORY_CARE_PROVIDER_SITE_OTHER): Payer: PPO | Admitting: Pharmacist Clinician (PhC)/ Clinical Pharmacy Specialist

## 2015-10-25 DIAGNOSIS — Z7901 Long term (current) use of anticoagulants: Secondary | ICD-10-CM | POA: Diagnosis not present

## 2015-10-25 DIAGNOSIS — I4891 Unspecified atrial fibrillation: Secondary | ICD-10-CM | POA: Diagnosis not present

## 2015-10-25 LAB — POCT INR: INR: 2.9

## 2015-10-31 ENCOUNTER — Other Ambulatory Visit (HOSPITAL_COMMUNITY): Payer: Self-pay | Admitting: Family Medicine

## 2015-10-31 DIAGNOSIS — R6881 Early satiety: Secondary | ICD-10-CM

## 2015-10-31 DIAGNOSIS — R634 Abnormal weight loss: Secondary | ICD-10-CM | POA: Diagnosis not present

## 2015-10-31 DIAGNOSIS — I10 Essential (primary) hypertension: Secondary | ICD-10-CM | POA: Diagnosis not present

## 2015-10-31 DIAGNOSIS — E119 Type 2 diabetes mellitus without complications: Secondary | ICD-10-CM | POA: Diagnosis not present

## 2015-11-05 ENCOUNTER — Ambulatory Visit (HOSPITAL_COMMUNITY): Payer: PPO

## 2015-11-08 ENCOUNTER — Encounter (HOSPITAL_COMMUNITY)
Admission: RE | Admit: 2015-11-08 | Discharge: 2015-11-08 | Disposition: A | Discharge status: Home / Self Care | Payer: PPO | Source: Ambulatory Visit | Attending: Family Medicine | Admitting: Family Medicine

## 2015-11-08 DIAGNOSIS — R634 Abnormal weight loss: Secondary | ICD-10-CM | POA: Insufficient documentation

## 2015-11-08 DIAGNOSIS — R6881 Early satiety: Secondary | ICD-10-CM | POA: Diagnosis not present

## 2015-11-08 MED ORDER — TECHNETIUM TC 99M SULFUR COLLOID: 2.0000 | Freq: Once | INTRAVENOUS | Status: DC | PRN

## 2015-11-15 ENCOUNTER — Ambulatory Visit (INDEPENDENT_AMBULATORY_CARE_PROVIDER_SITE_OTHER): Payer: PPO | Admitting: Pharmacist

## 2015-11-15 DIAGNOSIS — Z7901 Long term (current) use of anticoagulants: Secondary | ICD-10-CM

## 2015-11-15 DIAGNOSIS — I4891 Unspecified atrial fibrillation: Secondary | ICD-10-CM

## 2015-11-15 LAB — POCT INR: INR: 2.3

## 2015-12-13 ENCOUNTER — Ambulatory Visit (INDEPENDENT_AMBULATORY_CARE_PROVIDER_SITE_OTHER): Payer: PPO | Admitting: Pharmacist Clinician (PhC)/ Clinical Pharmacy Specialist

## 2015-12-13 DIAGNOSIS — I4891 Unspecified atrial fibrillation: Secondary | ICD-10-CM

## 2015-12-13 DIAGNOSIS — Z7901 Long term (current) use of anticoagulants: Secondary | ICD-10-CM | POA: Diagnosis not present

## 2015-12-13 LAB — POCT INR: INR: 1.9

## 2016-01-02 DIAGNOSIS — R634 Abnormal weight loss: Secondary | ICD-10-CM | POA: Diagnosis not present

## 2016-01-02 DIAGNOSIS — E119 Type 2 diabetes mellitus without complications: Secondary | ICD-10-CM | POA: Diagnosis not present

## 2016-01-02 DIAGNOSIS — E039 Hypothyroidism, unspecified: Secondary | ICD-10-CM | POA: Diagnosis not present

## 2016-01-08 DIAGNOSIS — R634 Abnormal weight loss: Secondary | ICD-10-CM | POA: Diagnosis not present

## 2016-01-08 DIAGNOSIS — E039 Hypothyroidism, unspecified: Secondary | ICD-10-CM | POA: Diagnosis not present

## 2016-01-08 DIAGNOSIS — R740 Nonspecific elevation of levels of transaminase and lactic acid dehydrogenase [LDH]: Secondary | ICD-10-CM | POA: Diagnosis not present

## 2016-01-08 DIAGNOSIS — E119 Type 2 diabetes mellitus without complications: Secondary | ICD-10-CM | POA: Diagnosis not present

## 2016-01-08 DIAGNOSIS — I872 Venous insufficiency (chronic) (peripheral): Secondary | ICD-10-CM | POA: Diagnosis not present

## 2016-01-10 ENCOUNTER — Ambulatory Visit (INDEPENDENT_AMBULATORY_CARE_PROVIDER_SITE_OTHER): Payer: PPO | Admitting: Pharmacist Clinician (PhC)/ Clinical Pharmacy Specialist

## 2016-01-10 DIAGNOSIS — Z7901 Long term (current) use of anticoagulants: Secondary | ICD-10-CM | POA: Diagnosis not present

## 2016-01-10 DIAGNOSIS — I4891 Unspecified atrial fibrillation: Secondary | ICD-10-CM

## 2016-01-10 LAB — POCT INR: INR: 1.4

## 2016-01-13 ENCOUNTER — Other Ambulatory Visit: Payer: Self-pay | Admitting: Family Medicine

## 2016-01-13 DIAGNOSIS — R748 Abnormal levels of other serum enzymes: Secondary | ICD-10-CM

## 2016-01-15 ENCOUNTER — Ambulatory Visit
Admission: RE | Admit: 2016-01-15 | Discharge: 2016-01-15 | Disposition: A | Discharge status: Home / Self Care | Payer: PPO | Source: Ambulatory Visit | Attending: Family Medicine | Admitting: Family Medicine

## 2016-01-15 DIAGNOSIS — R7989 Other specified abnormal findings of blood chemistry: Secondary | ICD-10-CM | POA: Diagnosis not present

## 2016-01-15 DIAGNOSIS — R748 Abnormal levels of other serum enzymes: Secondary | ICD-10-CM

## 2016-01-22 ENCOUNTER — Other Ambulatory Visit: Payer: Self-pay | Admitting: Pharmacist

## 2016-01-22 MED ORDER — WARFARIN SODIUM 5 MG PO TABS: ORAL_TABLET | ORAL | Status: DC

## 2016-01-24 ENCOUNTER — Ambulatory Visit (INDEPENDENT_AMBULATORY_CARE_PROVIDER_SITE_OTHER): Payer: PPO | Admitting: Pharmacist

## 2016-01-24 DIAGNOSIS — Z7901 Long term (current) use of anticoagulants: Secondary | ICD-10-CM | POA: Diagnosis not present

## 2016-01-24 DIAGNOSIS — I4891 Unspecified atrial fibrillation: Secondary | ICD-10-CM

## 2016-01-24 LAB — POCT INR: INR: 2.4

## 2016-02-14 ENCOUNTER — Ambulatory Visit (INDEPENDENT_AMBULATORY_CARE_PROVIDER_SITE_OTHER): Payer: PPO | Admitting: Pharmacist Clinician (PhC)/ Clinical Pharmacy Specialist

## 2016-02-14 DIAGNOSIS — I4891 Unspecified atrial fibrillation: Secondary | ICD-10-CM

## 2016-02-14 DIAGNOSIS — Z7901 Long term (current) use of anticoagulants: Secondary | ICD-10-CM | POA: Diagnosis not present

## 2016-02-14 LAB — POCT INR: INR: 1.6

## 2016-02-25 ENCOUNTER — Ambulatory Visit (INDEPENDENT_AMBULATORY_CARE_PROVIDER_SITE_OTHER): Payer: PPO | Admitting: Podiatry

## 2016-02-25 ENCOUNTER — Encounter: Payer: Self-pay | Admitting: Podiatry

## 2016-02-25 VITALS — BP 134/88 | HR 64 | Resp 12

## 2016-02-25 DIAGNOSIS — M79675 Pain in left toe(s): Secondary | ICD-10-CM

## 2016-02-25 DIAGNOSIS — M79674 Pain in right toe(s): Secondary | ICD-10-CM

## 2016-02-25 DIAGNOSIS — B351 Tinea unguium: Secondary | ICD-10-CM

## 2016-02-25 NOTE — Progress Notes (Signed)
   Subjective:    Patient ID: Timothy Mclean, male    DOB: 02-12-1941, 75 y.o.   MRN: [id number]  HPI     This patient presents today complaining of elongated and thickened toenails which are uncomfortable when walking and wearing shoes that have gradually worsened over the last several years. He describes soaking his toes in Epsom salts to release some of the discomfort in toenails as well as a generalized itching in the feet Patient is not able to trim toenails and has been to pedicurist in the past for nail debridement. Patient is requesting debridement of toenails  Patient is type II diabetic and denies any history of foot ulceration, amputation or claudication  Review of Systems  Skin: Positive for color change.       Objective:   Physical Exam Patient appears orientated 3  Vascular: No calf tenderness or calf edema bilaterally DP pulses 2/4 bilaterally PT pulses 1/4 bilaterally Capillary reflex immediate bilaterally  Neurological: Sensation to 10 g monofilament wire intact 5/5 bilaterally Vibratory sensation nonreactive bilaterally Ankle reflexes equal and reactive bilaterally  Dermatological: No open skin lesions bilaterally Skin texture and turgor appears within normal limits The toenails are hypertrophic, elongated, discolored and tender to direct palpation 6-10  Musculoskeletal: Hammertoe second bilaterally Unsteady gait pattern Is no restriction ankle, subtalar, midtarsal joints bilaterally       Assessment & Plan:   Assessment: Type II diabetic Decreased posterior tibial pulses bilaterally Decreased vibratory sensation  Plan: Today I reviewed the results of the examination with patient and patient's wife. I recommended periodic nail debridement. Patient verbally consents. The toenails 6-10 were debrided mechanically and let without any bleeding  Reappoint 3 months

## 2016-02-25 NOTE — Patient Instructions (Signed)
Diabetes and Foot Care Diabetes may cause you to have problems because of poor blood supply (circulation) to your feet and legs. This may cause the skin on your feet to become thinner, break easier, and heal more slowly. Your skin may become dry, and the skin may peel and crack. You may also have nerve damage in your legs and feet causing decreased feeling in them. You may not notice minor injuries to your feet that could lead to infections or more serious problems. Taking care of your feet is one of the most important things you can do for yourself.  HOME CARE INSTRUCTIONS  Wear shoes at all times, even in the house. Do not go barefoot. Bare feet are easily injured.  Check your feet daily for blisters, cuts, and redness. If you cannot see the bottom of your feet, use a mirror or ask someone for help.  Wash your feet with warm water (do not use hot water) and mild soap. Then pat your feet and the areas between your toes until they are completely dry. Do not soak your feet as this can dry your skin.  Apply a moisturizing lotion or petroleum jelly (that does not contain alcohol and is unscented) to the skin on your feet and to dry, brittle toenails. Do not apply lotion between your toes.  Trim your toenails straight across. Do not dig under them or around the cuticle. File the edges of your nails with an emery board or nail file.  Do not cut corns or calluses or try to remove them with medicine.  Wear clean socks or stockings every day. Make sure they are not too tight. Do not wear knee-high stockings since they may decrease blood flow to your legs.  Wear shoes that fit properly and have enough cushioning. To break in new shoes, wear them for just a few hours a day. This prevents you from injuring your feet. Always look in your shoes before you put them on to be sure there are no objects inside.  Do not cross your legs. This may decrease the blood flow to your feet.  If you find a minor scrape,  cut, or break in the skin on your feet, keep it and the skin around it clean and dry. These areas may be cleansed with mild soap and water. Do not cleanse the area with peroxide, alcohol, or iodine.  When you remove an adhesive bandage, be sure not to damage the skin around it.  If you have a wound, look at it several times a day to make sure it is healing.  Do not use heating pads or hot water bottles. They may burn your skin. If you have lost feeling in your feet or legs, you may not know it is happening until it is too late.  Make sure your health care provider performs a complete foot exam at least annually or more often if you have foot problems. Report any cuts, sores, or bruises to your health care provider immediately. SEEK MEDICAL CARE IF:   You have an injury that is not healing.  You have cuts or breaks in the skin.  You have an ingrown nail.  You notice redness on your legs or feet.  You feel burning or tingling in your legs or feet.  You have pain or cramps in your legs and feet.  Your legs or feet are numb.  Your feet always feel cold. SEEK IMMEDIATE MEDICAL CARE IF:   There is increasing redness,   swelling, or pain in or around a wound.  There is a red line that goes up your leg.  Pus is coming from a wound.  You develop a fever or as directed by your health care provider.  You notice a bad smell coming from an ulcer or wound.   This information is not intended to replace advice given to you by your health care provider. Make sure you discuss any questions you have with your health care provider.   Document Released: 07/03/2000 Document Revised: 03/08/2013 Document Reviewed: 12/13/2012 Elsevier Interactive Patient Education 2016 [employer]  

## 2016-02-26 ENCOUNTER — Ambulatory Visit (INDEPENDENT_AMBULATORY_CARE_PROVIDER_SITE_OTHER): Payer: PPO | Admitting: Pharmacist

## 2016-02-26 ENCOUNTER — Ambulatory Visit (INDEPENDENT_AMBULATORY_CARE_PROVIDER_SITE_OTHER): Payer: PPO | Admitting: Neurology

## 2016-02-26 ENCOUNTER — Encounter: Payer: Self-pay | Admitting: Neurology

## 2016-02-26 VITALS — BP 146/78 | HR 78 | Resp 16 | Ht 72.0 in | Wt 184.0 lb

## 2016-02-26 DIAGNOSIS — I4891 Unspecified atrial fibrillation: Secondary | ICD-10-CM

## 2016-02-26 DIAGNOSIS — R278 Other lack of coordination: Secondary | ICD-10-CM | POA: Diagnosis not present

## 2016-02-26 DIAGNOSIS — Z7901 Long term (current) use of anticoagulants: Secondary | ICD-10-CM

## 2016-02-26 DIAGNOSIS — Z9181 History of falling: Secondary | ICD-10-CM

## 2016-02-26 DIAGNOSIS — R413 Other amnesia: Secondary | ICD-10-CM | POA: Diagnosis not present

## 2016-02-26 DIAGNOSIS — R2689 Other abnormalities of gait and mobility: Secondary | ICD-10-CM | POA: Diagnosis not present

## 2016-02-26 LAB — POCT INR: INR: 3.1

## 2016-02-26 NOTE — Progress Notes (Signed)
Subjective:    Patient ID: Timothy Mclean is a 75 y.o. male.  HPI     Interim history:   Timothy Mclean is a 75 year old right-handed gentleman with an underlying complex medical history of reflux disease, hypertension, chronic lung disease, vitamin D deficiency, vitamin B12 deficiency, thyroid disease, type 2 diabetes, heart disease, status post MI and PTCA in 1987, status post CABG in 1997, paroxysmal atrial fibrillation on Coumadin, gout, BPH, kidney stone, diverticulosis, shingles in 2009, who presents for follow-up consultation of his lower extremity weakness and numbness and gait d/o. He is accompanied by his wife again today.  I last saw him on 08/29/2015, at which time his MMSE was 26 out of 30, clock drawing was 4 out of 4, animal fluency was 10/m. He had received a new hearing aid on the right side which helped his hearing. He had no recent falls. He was tolerating the Crestor and had reduced his soda intake. He was encouraged to increase his water intake and use his cane at all times.   Today, 02/26/2016: He reports doing okay, stable with his memory, does not exercise very much. Does not use his stationary bike very much either per wife. He drinks less soda, better with the water. Drinks sweetened tea too. Had a fall on the deck about 3 months ago, thankfully without injuries. Does bruise easily d/t coumadin. Walks with a single point cane, outside, not inside. Likes to drink 2 glasses of tea per day and coffee in the morning, perhaps not always enough water.   Previously:    I saw him on 02/26/2015, at which time he reported doing fairly well. He had not fallen recently. He finished outpatient physical therapy on 12/18/2014. I reviewed the discharge summary at the time. He was given home exercises as well. His appetite was stable. His weight had been more stable. He has adjusted to his new dentures. His MMSE was 26/30 (missed 1 point on serial sevens, 2 points on orientation, 1 point on  remote recall), CDT: 4/4, AFT: 9/min. I asked him to stay active mentally and physically, drink plenty of water, make sure he eats regularly and nutritious food and that he use a cane for safety.   I saw him on 09/27/2014, at which time he reported doing fairly well. He was treated for fungal infection of his foot by his primary care physician. His wife reported that he had some memory loss and occasional confusion. He had lost some weight. He had a checkup with Dr. Sandi Mariscal on 07/13/2014 and I reviewed his blood test results from that visit: CK was 154, vitamin B12 was 1392, methylmalonic acid was 90, CBC with differential was unremarkable, CMP showed a glucose of 114 and otherwise unremarkable, uric acid normal, CRP normal, TSH elevated at 11, triglycerides 118, LDL within normal. He was not drinking enough water and had no recent falls. He was drinking a lot of [facility] He had recent teeth extractions. He was maintaining well on Crestor. His exam was stable. I suggested outpatient physical therapy. I referred him to neuro rehabilitation for this. I asked to stay better hydrated.    I saw him on 05/29/14, at which time he reported taking B12 orally. He was on Crestor. He saw a spine specialist at [facility], who did a lower spine injection, which he felt helped. He was not on Lescol or lovastatin. He felt a little better with his walking as he was using a cane. He had not fallen.  He was using his stationary bike a little bit more regularly. I felt that he had a gait disorder secondary to peripheral neuropathy, degenerative lower spine disease, possibly medication side effects, his hemoglobin A1c was suboptimal in June 2015 at 7.5. He was advised to see his primary care physician for recheck on his A1c and B12 levels. He was taking his B12. I suggested we recheck his CK levels which were done on 05/29/2014. This came back as normal and we advised him of the test results in writing.     I first met him on  12/26/2013 at the request of his primary care physician, at which time the patient reported lower extremity pain, weakness and numbness associated with a history of low back pain since 2012. I suggested further workup in the form of EMG nerve conduction studies to the lower extremities, blood work.   Blood work from 12/26/2013 showed a hemoglobin A1c of 7.5, methylmalonic acid was elevated at 469, ANA was negative, CRP was normal, aldolase was borderline at 10.4, CK was 451. EMG and nerve conduction testing from 01/18/2014 showed: bilateral lumbar radiculopathies, involving bilateral L4, L5, S1 myotomes. In addition, there is evidence of chronic right cervical radiculopathy, involving right C5 nerve roots. There was also evidence of length dependent mild axonal peripheral neuropathy. I called his wife with the test results. I suggested evaluation with a spine specialist.   He was found to be intolerant to Lipitor generic and was taken off of it. He was switched to pravastatin but this was also subsequently discontinued with some subjective improvement of his symptoms. He had a mild increase in CK at 258 which subsequently returned to normal after discontinuation of his Zocor. He has known degenerative lower back disease with an MRI showing impingement at L4-5 and L5-S1 level, he has had multiple steroid epidural injections. Because of his vascular risk factors he was started on lovastatin. His last epidural injection was in January 2015 which was not helpful. Lovastatin was discontinued in February 2015. CK level was mildly elevated at 338. His B12 supplement dose was increased recently. He had a CT head without contrast on 01/11/2013 after a fall. This showed generalized atrophy, mild chronic microvascular ischemic changes in the white matter, no acute infarct. He also had a CT cervical spine without contrast at the time which showed moderate degenerative changes, no fracture. MRI lumbar spine without  contrast on 08/13/2011 showed lumbar spondylosis and degenerative disease, causing marked impingement at L4-5 and moderate impingement at L5-S1. He was started on Crestor but was initially also taking the Lovastatin 40 mg daily as well. Patient was not taking B12 injections or oral B12.   He has some tingling in the feet. He has been diabetic for about 5 years. He has no significant foot pain. He denies fasciculations and atrophy, and myoclonus. His legs gives out some and he fatigues easily. He has not had recurrent falls, but did take a fall outside at his niece's ballgame, it was raining and the terrain was hilly and slick and rocky. 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 He volunteers at the firestation.   He quit smoking in '86 and does not drink alcohol. There is no FHx muscle or nerve disease.      His Past Medical History Is Significant For: Past Medical History:  Diagnosis Date  . Allergy   . Anxiety   . Barrett's esophagus 03/09/2014  . CAD S/P percutaneous coronary  angioplasty 1987   angioplasty and  STENT- Cx; MYOVIEW 1/'05: ? Ischemia vs. artifact --> sent for cath  . DDD (degenerative disc disease)   . Dyslipidemia, goal LDL below 70    Doing better off of Zocor, currently on Lescol   . Esophageal reflux   . GERD (gastroesophageal reflux disease)   . Gout, unspecified   . Iron deficiency anemia, unspecified   . Other and unspecified hyperlipidemia   . Other B-complex deficiencies   . PAF (paroxysmal atrial fibrillation) ([place])    Last reported episode 2012; on amiodarone and anticoagulated on warfarin  . Personal history of colonic polyps   . S/P CABG x 4 1997   a) LIMA-LAD, SVG to OM, SVG-dRCA-RPL; b) FALSE + MYOVIEW --> CATH 1/'05: 100% LAD after widelly patent D1, all Cx OM branches 100%, pRCA 100%, SVG-PDA patent w/ retrograde filling of RPL, SVG-OM widely patent ~ normal OM, LIMA-LAD patent.  [name] Spinal stenosis   . ST elevation myocardial  infarction (STEMI) of inferior wall, subsequent episode of care ([place]) 1987, 1997   a) PTCA of Cx; b) CABG   . Type II or unspecified type diabetes mellitus without mention of complication, not stated as uncontrolled    no meds  . Ulcerative colitis, unspecified   . Unspecified essential hypertension     His Past Surgical History Is Significant For: Past Surgical History:  Procedure Laterality Date  . CARDIAC CATHETERIZATION  08/17/2003   "False positive stress test " grafts patent; RCA proximal 100% LAD 100% occlusion after normal D1 with 80% ostial as SP1; circumflex patent but OM1 on to all occluded; EF 50-55%  . CARDIAC CATHETERIZATION  11/1995   Preop CABG: LAD 90% at D1, circumflex 100 and OM a 90% proximal, 80% distal  . Carotid Duplex Doppler  12/23/2009   Right&Left ICAs 0-49%, mildly abnormal study,   . CHOLECYSTECTOMY    . COLONOSCOPY    . CORONARY ARTERY BYPASS GRAFT  11/1995   LIMA-LAD, SVG to OM, SVG-dRCA-RPL  . ESOPHAGOGASTRODUODENOSCOPY    . FLEXIBLE SIGMOIDOSCOPY    . NM MYOCAR PERF EJECTION FRACTION  07/03/2003   FALSE POSITIVE TEST; Bruce protocol, negative test with scintigraphic evidence of inferoapical scar, diaphragmatic attenuation, moderate ischemia.  [name] NM MYOVIEW LTD  2005   Questionable ischemia that is likely either infarct versus artifact; normal  . TRANSTHORACIC ECHOCARDIOGRAM  12/03/2008   LVEF =>55% normal study    His Family History Is Significant For: Family History  Problem Relation Age of Onset  . Diabetes Mother   . Colon cancer Neg Hx   . Esophageal cancer Neg Hx   . Rectal cancer Neg Hx   . Stomach cancer Neg Hx     His Social History Is Significant For: Social History   Social History  . Marital status: Married    Spouse name: N/A  . Number of children: 1  . Years of education: N/A   Occupational History  . retired    Social History Main Topics  . Smoking status: Former Smoker    Packs/day: 1.00    Years: 32.00    Types:  Cigarettes    Quit date: 08/27/1984  . Smokeless tobacco: Never Used  . Alcohol use No  . Drug use: No  . Sexual activity: Not Asked   Other Topics Concern  . None   Social History Narrative   He is a married father of one. Does not smoke and does not drink. He walks routinely  and also does stationary bike. 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 amiodarone (PACERONE) 200 MG tablet Take 1 tablet (200 mg total) by mouth daily.  [name] aspirin 81 MG tablet Take 81 mg by mouth daily.  . balsalazide (COLAZAL) 750 MG capsule TAKE 2 CAPSULES BY MOUTH TWICE A DAY  . BAYER CONTOUR TEST test strip   . CRESTOR 20 MG tablet Take 1 tablet by mouth daily.  . cyanocobalamin 2000 MCG tablet Take 2,000 mcg by mouth daily.  [name] levothyroxine (SYNTHROID, LEVOTHROID) 75 MCG tablet Take 1 tablet by mouth daily. Take 1 tab daily  . metFORMIN (GLUCOPHAGE) 500 MG tablet   . oxybutynin (DITROPAN) 5 MG tablet Take 5 mg by mouth 1 day or 1 dose.   . pantoprazole (PROTONIX) 40 MG tablet Take 1 tablet by mouth daily.  . vitamin D, CHOLECALCIFEROL, 400 UNITS tablet Take 400 Units by mouth daily.  [name] warfarin (COUMADIN) 5 MG tablet Take as directed by [facility].  . [DISCONTINUED] triamcinolone cream (KENALOG) 0.1 % Apply 1 application topically daily.   No facility-administered encounter medications on file as of 02/26/2016.   :  Review of Systems:  Out of a complete 14 point review of systems, all are reviewed and negative with the exception of these symptoms as listed below:  Review of Systems  Neurological:       Wife reports that patient still has trouble with walking. Fell 3 months ago.     Objective:  Neurologic Exam  Physical Exam Physical Examination:    Vitals:   02/26/16 1257  BP: (!) 146/78  Pulse: 78  Resp: 16   General Examination: The patient is a very pleasant 75 y.o. male in no acute distress. He appears well-developed and well-nourished and well groomed. He has lost more weight, but not drastic. He is in good spirits today. He is situated in his chair.  HEENT: Normocephalic, atraumatic, pupils are equal, round and reactive to light and accommodation. He has corrective eyeglasses. Extraocular tracking is good without limitation to gaze excursion or nystagmus noted. Normal smooth pursuit is noted. Hearing is mildly impaired, but better with right hearing aid in place. Face is symmetric with normal facial animation and normal facial sensation. Speech is clear with no dysarthria noted. There is no hypophonia. There is no lip, neck/head, jaw or voice tremor. Neck is supple with full range of passive and active motion. There are no carotid bruits on auscultation. Oropharynx exam reveals: mild to moderate mouth dryness, adequate dental hygiene with complete dentures in place, and moderate airway crowding, due to large tongue and narrow appearing airway. Mallampati is class III. Tongue protrudes centrally and palate elevates symmetrically.   Chest: Clear to auscultation without wheezing, rhonchi or crackles noted.  Heart: S1+S2+0, regular and normal without murmurs, rubs or gallops noted.   Abdomen: Soft, non-tender and non-distended with normal bowel sounds appreciated on auscultation.  Extremities: There is no pitting edema in the distal lower extremities bilaterally. Pedal pulses are intact.  Skin: Warm and dry without trophic changes noted. There are no varicose veins.  Musculoskeletal: exam reveals no obvious joint deformities, tenderness or joint swelling or erythema.   Neurologically:  Mental status: The patient is awake, alert and oriented in all 4 spheres.  His immediate and remote memory, attention, language skills and fund of  knowledge are fairly appropriate. There is no evidence of aphasia, agnosia, apraxia or anomia. Speech is clear with normal prosody and enunciation. Thought process is linear. Mood is normal and affect is normal.   On 02/26/2015: MMSE: 26/30, he missed 1 point on serial sevens, and 2 points on orientation as well as one point on remote recall. CDT: 4/4, AFT: 9/min.  On 08/29/2015: MMSE: 26/30, CDT: 4/4, AFT: 10/min.   On 02/26/2016: MMSE: 27/30, CDT: 4/4, AFT: 9/min.  Cranial nerves II - XII are as described above under HEENT exam. In addition: shoulder shrug is normal with equal shoulder height noted. Motor exam: Normal bulk, strength and tone is noted in the upper extremities. He has mildly decreased bulk in the lower extremities with no fasciculation or focal atrophy noted no trophic changes were noted. He has mild hip flexor weakness right more than left and mild knee flexor weakness on the left. Overall stable. There is no drift, tremor or rebound, with the exception of possible slight left upper extremity rebound. Romberg shows mild sway. Reflexes are 1+ throughout with absent ankle jerks noted, unchanged. Fine motor skills and coordination: intact with normal finger taps, normal hand movements, normal rapid alternating patting, normal foot taps and normal foot agility, but overall mild slowness noted.  Cerebellar testing: No dysmetria or intention tremor on finger to nose testing. Heel to shin is unremarkable, but slightly slow bilaterally. There is no truncal ataxia.  Sensory exam: stable with decrease in pinprick and vibration sense noted in the distal lower extremities bilaterally.   Gait, station and balance: He stands with mild difficulty. He is not able to stand narrow based and stands mildly wide-based and slightly bowlegged, unchanged. No veering to one side is noted. No leaning to one side is noted. Posture is age-appropriate. He walks slightly wide-based. He is not able to perform tandem walk.  He uses a cane and is more stable with the cane on the right.   Assessment and Plan:   In summary, Timothy Mclean is a very pleasant 75 year old male with an underlying complex medical history of reflux disease, hypertension, chronic lung disease, vitamin D deficiency, vitamin B12 deficiency, thyroid disease, type 2 diabetes, heart disease, status post MI and PTCA in 1987, status post CABG in 1997, paroxysmal atrial fibrillation (on Coumadin), gout, BPH, kidney stone, diverticulosis, shingles in 2009, who presents for follow-up consultation of his gait disorder with overall stable findings and stable mild memory loss. His gait d/o is most likely multifactorial in etiology, secondary to degenerative spine disease, peripheral neuropathy, and mild lower extremity weakness which appears to have improved or at least remained stable. Workup showed mild axonal neuropathy, likely diabetic in nature, CK levels were elevated in the past but improved and he has been on Crestor 20 mg with good tolerance. He's had some weight loss but nothing drastic and stable in the past several months. He is advised to continue using his cane for safety. He is advised to stay well-hydrated and active physically and mentally. He has a stationary bike which he is encouraged to use more. His memory scores have been stable, we will continue to monitor. I think having the hearing aid has helped as well. I would like to see him back in 8 months, sooner if the need arises. I answered all their questions today and the patient and his wife were in agreement. I spent 25 minutes in  total face-to-face time with the patient, more than 50% of which was spent in counseling and coordination of care, reviewing test results, reviewing medication and discussing or reviewing the diagnosis of gait disorder, memory loss, the prognosis and treatment options.

## 2016-02-26 NOTE — Patient Instructions (Signed)
Please try to exercise your leg muscles using the stationary bike, drink enough water, about 6 glasses per day, reduce your soda intake, limit your tea intake to 1 glass per day, one coffee per day.   Follow up in 8 months, exam is stable.

## 2016-03-04 DIAGNOSIS — H25813 Combined forms of age-related cataract, bilateral: Secondary | ICD-10-CM | POA: Diagnosis not present

## 2016-03-04 DIAGNOSIS — R740 Nonspecific elevation of levels of transaminase and lactic acid dehydrogenase [LDH]: Secondary | ICD-10-CM | POA: Diagnosis not present

## 2016-03-04 DIAGNOSIS — E119 Type 2 diabetes mellitus without complications: Secondary | ICD-10-CM | POA: Diagnosis not present

## 2016-03-04 DIAGNOSIS — H10413 Chronic giant papillary conjunctivitis, bilateral: Secondary | ICD-10-CM | POA: Diagnosis not present

## 2016-03-04 DIAGNOSIS — H31001 Unspecified chorioretinal scars, right eye: Secondary | ICD-10-CM | POA: Diagnosis not present

## 2016-03-04 DIAGNOSIS — H04123 Dry eye syndrome of bilateral lacrimal glands: Secondary | ICD-10-CM | POA: Diagnosis not present

## 2016-03-07 ENCOUNTER — Other Ambulatory Visit: Payer: Self-pay | Admitting: Internal Medicine

## 2016-03-11 ENCOUNTER — Ambulatory Visit (INDEPENDENT_AMBULATORY_CARE_PROVIDER_SITE_OTHER): Payer: PPO | Admitting: Pharmacist

## 2016-03-11 DIAGNOSIS — I4891 Unspecified atrial fibrillation: Secondary | ICD-10-CM | POA: Diagnosis not present

## 2016-03-11 DIAGNOSIS — Z7901 Long term (current) use of anticoagulants: Secondary | ICD-10-CM | POA: Diagnosis not present

## 2016-03-11 LAB — POCT INR: INR: 2.8

## 2016-04-02 ENCOUNTER — Ambulatory Visit (INDEPENDENT_AMBULATORY_CARE_PROVIDER_SITE_OTHER): Payer: PPO | Admitting: Pharmacist

## 2016-04-02 DIAGNOSIS — Z7901 Long term (current) use of anticoagulants: Secondary | ICD-10-CM

## 2016-04-02 DIAGNOSIS — I4891 Unspecified atrial fibrillation: Secondary | ICD-10-CM

## 2016-04-02 LAB — POCT INR: INR: 2.7

## 2016-04-03 ENCOUNTER — Other Ambulatory Visit: Payer: Self-pay | Admitting: Cardiology

## 2016-04-03 NOTE — Telephone Encounter (Signed)
Rx request sent to pharmacy.  

## 2016-04-17 ENCOUNTER — Ambulatory Visit (HOSPITAL_COMMUNITY)
Admission: EM | Admit: 2016-04-17 | Discharge: 2016-04-17 | Disposition: A | Discharge status: Home / Self Care | Payer: PPO | Attending: Family Medicine | Admitting: Family Medicine

## 2016-04-17 DIAGNOSIS — L03115 Cellulitis of right lower limb: Secondary | ICD-10-CM

## 2016-04-17 DIAGNOSIS — L089 Local infection of the skin and subcutaneous tissue, unspecified: Secondary | ICD-10-CM | POA: Diagnosis not present

## 2016-04-17 DIAGNOSIS — T798XXA Other early complications of trauma, initial encounter: Secondary | ICD-10-CM | POA: Diagnosis not present

## 2016-04-17 DIAGNOSIS — S80811A Abrasion, right lower leg, initial encounter: Secondary | ICD-10-CM

## 2016-04-17 MED ORDER — CEPHALEXIN 500 MG PO CAPS: 500.0000 mg | ORAL_CAPSULE | Freq: Four times a day (QID) | ORAL | 0 refills | Status: DC

## 2016-04-17 MED ORDER — CEFTRIAXONE SODIUM 1 G IJ SOLR
1.0000 g | Freq: Once | INTRAMUSCULAR | Status: AC
  Administered 2016-04-17: 1 g via INTRAMUSCULAR

## 2016-04-17 MED ORDER — LIDOCAINE HCL (PF) 1 % IJ SOLN
INTRAMUSCULAR | Status: AC
  Filled 2016-04-17: qty 2

## 2016-04-17 MED ORDER — CEFTRIAXONE SODIUM 250 MG IJ SOLR
INTRAMUSCULAR | Status: AC
  Filled 2016-04-17: qty 250

## 2016-04-17 NOTE — ED Provider Notes (Signed)
CSN: [id number]     Arrival date & time 04/17/16  1455 History   First MD Initiated Contact with Patient 04/17/16 1601     Chief Complaint  Patient presents with  . Leg Injury   (Consider location/radiation/quality/duration/timing/severity/associated sxs/prior Treatment) 75 year old male was on a riding lawnmower one week ago. His metal cane got called in moving parts of the lawnmower snapped into and struck him in the right leg. This produced a linear abrasion to the right lower leg. He has Y fibbing cleaning it daily and applying Polysporin ointment. They are concerned because he has had some darkening of the scan along with swelling and local pain. Denies fever, chills or other systemic symptoms. Last tetanus shot was in December 2014.      Past Medical History:  Diagnosis Date  . Allergy   . Anxiety   . Barrett's esophagus 03/09/2014  . CAD S/P percutaneous coronary angioplasty 1987   angioplasty and  STENT- Cx; MYOVIEW 1/'05: ? Ischemia vs. artifact --> sent for cath  . DDD (degenerative disc disease)   . Dyslipidemia, goal LDL below 70    Doing better off of Zocor, currently on Lescol   . Esophageal reflux   . GERD (gastroesophageal reflux disease)   . Gout, unspecified   . Iron deficiency anemia, unspecified   . Other and unspecified hyperlipidemia   . Other B-complex deficiencies   . PAF (paroxysmal atrial fibrillation) ([place])    Last reported episode 2012; on amiodarone and anticoagulated on warfarin  . Personal history of colonic polyps   . S/P CABG x 4 1997   a) LIMA-LAD, SVG to OM, SVG-dRCA-RPL; b) FALSE + MYOVIEW --> CATH 1/'05: 100% LAD after widelly patent D1, all Cx OM branches 100%, pRCA 100%, SVG-PDA patent w/ retrograde filling of RPL, SVG-OM widely patent ~ normal OM, LIMA-LAD patent.  [name] Spinal stenosis   . ST elevation myocardial infarction (STEMI) of inferior wall, subsequent episode of care ([place]) 1987, 1997   a) PTCA of Cx; b) CABG   . Type II or  unspecified type diabetes mellitus without mention of complication, not stated as uncontrolled    no meds  . Ulcerative colitis, unspecified   . Unspecified essential hypertension    Past Surgical History:  Procedure Laterality Date  . CARDIAC CATHETERIZATION  08/17/2003   "False positive stress test " grafts patent; RCA proximal 100% LAD 100% occlusion after normal D1 with 80% ostial as SP1; circumflex patent but OM1 on to all occluded; EF 50-55%  . CARDIAC CATHETERIZATION  11/1995   Preop CABG: LAD 90% at D1, circumflex 100 and OM a 90% proximal, 80% distal  . Carotid Duplex Doppler  12/23/2009   Right&Left ICAs 0-49%, mildly abnormal study,   . CHOLECYSTECTOMY    . COLONOSCOPY    . CORONARY ARTERY BYPASS GRAFT  11/1995   LIMA-LAD, SVG to OM, SVG-dRCA-RPL  . ESOPHAGOGASTRODUODENOSCOPY    . FLEXIBLE SIGMOIDOSCOPY    . NM MYOCAR PERF EJECTION FRACTION  07/03/2003   FALSE POSITIVE TEST; Bruce protocol, negative test with scintigraphic evidence of inferoapical scar, diaphragmatic attenuation, moderate ischemia.  [name] NM MYOVIEW LTD  2005   Questionable ischemia that is likely either infarct versus artifact; normal  . TRANSTHORACIC ECHOCARDIOGRAM  12/03/2008   LVEF =>55% normal study   Family History  Problem Relation Age of Onset  . Diabetes Mother   . Colon cancer Neg Hx   . Esophageal cancer Neg Hx   . Rectal cancer Neg Hx   .  Stomach cancer Neg Hx    Social History  Substance Use Topics  . Smoking status: Former Smoker    Packs/day: 1.00    Years: 32.00    Types: Cigarettes    Quit date: 08/27/1984  . Smokeless tobacco: Never Used  . Alcohol use No    Review of Systems  Constitutional: Negative.   HENT: Negative.   Respiratory: Negative.   Cardiovascular: Positive for leg swelling. Negative for chest pain.  Gastrointestinal: Negative.   Musculoskeletal: Negative.   Skin: Positive for wound.  Neurological: Negative.   All other systems reviewed and are  negative.   Allergies  Review of patient's allergies indicates no known allergies.  Home Medications   Prior to Admission medications   Medication Sig Start Date End Date Taking? Authorizing Provider  amiodarone (PACERONE) 200 MG tablet Take 1 tablet (200 mg total) by mouth daily. Please schedule appointment for refills. 04/03/16   Leonie Man, MD  aspirin 81 MG tablet Take 81 mg by mouth daily.    Historical Provider, MD  balsalazide (COLAZAL) 750 MG capsule TAKE 2 CAPSULES BY MOUTH TWICE A DAY 03/09/16   Gatha Mayer, MD  BAYER CONTOUR TEST test strip  05/07/14   Historical Provider, MD  CRESTOR 20 MG tablet Take 1 tablet by mouth daily. 07/23/14   Historical Provider, MD  cyanocobalamin 2000 MCG tablet Take 2,000 mcg by mouth daily.    Historical Provider, MD  levothyroxine (SYNTHROID, LEVOTHROID) 75 MCG tablet Take 1 tablet by mouth daily. Take 1 tab daily 01/08/15   Historical Provider, MD  metFORMIN (GLUCOPHAGE) 500 MG tablet  02/06/16   Historical Provider, MD  oxybutynin (DITROPAN) 5 MG tablet Take 5 mg by mouth 1 day or 1 dose.  10/02/11   Historical Provider, MD  pantoprazole (PROTONIX) 40 MG tablet Take 1 tablet by mouth daily. 09/07/14   Historical Provider, MD  vitamin D, CHOLECALCIFEROL, 400 UNITS tablet Take 400 Units by mouth daily.    Historical Provider, MD  warfarin (COUMADIN) 5 MG tablet Take 1/2 to 1 tablet by mouth daily as directed by [facility]. 04/03/16   Leonie Man, MD   Meds Ordered and Administered this Visit   Medications  cefTRIAXone (ROCEPHIN) injection 1 g (not administered)    BP 108/56 (BP Location: Left Arm)   Pulse 78   Temp 98.2 F (36.8 C) (Oral)   Resp 12   SpO2 99%  No data found.   Physical Exam  Constitutional: He is oriented to person, place, and time. He appears well-developed and well-nourished. No distress.  Eyes: EOM are normal.  Neck: Neck supple.  Cardiovascular: Normal rate.   Pulmonary/Chest: Effort normal. No  respiratory distress.  Musculoskeletal: He exhibits no edema.  Neurological: He is alert and oriented to person, place, and time. He exhibits normal muscle tone.  Skin: Skin is warm and dry.  There is an approximately 6-7 cm wide abrasion to the right lower leg in a vertical fashion along the scar of venous donor site from previous CABG. There is surrounding erythema and darker discoloration extending into the foot. Local tenderness around the wound. Closer inspection reveals a combination of serous fluid and some darker than like fluid likely representing infection. Distal neurovascular is intact. Unable to palpate the pedal pulse due to 2-3+ pitting edema. Movement and sensation is intact. Normal warmth.  Psychiatric: He has a normal mood and affect.  Nursing note and vitals reviewed.   Urgent Care Course  Clinical Course        Procedures (including critical care time)  Labs Review Labs Reviewed - No data to display  Imaging Review No results found.   Visual Acuity Review  Right Eye Distance:   Left Eye Distance:   Bilateral Distance:    Right Eye Near:   Left Eye Near:    Bilateral Near:         MDM   1. Infected wound, initial encounter   2. Abrasion, leg w/ infection, right, initial encounter   3. Cellulitis of right lower extremity    Clean the wound with warm soap and water daily. May apply Polysporin ointment for just 2 more days then after that stop using ointments. Allowed to dry periodically but keep covered when out and about. Elevate the lower extremity. Take the medication as directed. If there is any worsening over the weekend return or go to the emergency department. Follow-up with your primary care doctor on Monday or may return to the urgent care. Meds ordered this encounter  Medications  . cefTRIAXone (ROCEPHIN) injection 1 g   Meds ordered this encounter  Medications  . cefTRIAXone (ROCEPHIN) injection 1 g  . cephALEXin (KEFLEX) 500 MG  capsule    Sig: Take 1 capsule (500 mg total) by mouth 4 (four) times daily.    Dispense:  28 capsule    Refill:  0    Order Specific Question:   Supervising Provider    Answer:   Ihor Gully D [id number]  Only one injection Rocephin 1 g IM ordered and adm.     Janne Napoleon, NP 04/17/16 [phone]

## 2016-04-17 NOTE — Discharge Instructions (Signed)
Clean the wound with warm soap and water daily. May apply Polysporin ointment for just 2 more days then after that stop using ointments. Allowed to dry periodically but keep covered when out and about. Elevate the lower extremity. Take the medication as directed. If there is any worsening over the weekend return or go to the emergency department. Follow-up with your primary care doctor on Monday or may return to the urgent care.

## 2016-04-17 NOTE — ED Triage Notes (Signed)
States he was mowing grass when the wheel on lawnmower hit his leg Area was cleaned but still is red Up to date on tetanus  antibiotic ointment and bandage as tx

## 2016-04-20 DIAGNOSIS — L03115 Cellulitis of right lower limb: Secondary | ICD-10-CM | POA: Diagnosis not present

## 2016-04-20 DIAGNOSIS — S81811D Laceration without foreign body, right lower leg, subsequent encounter: Secondary | ICD-10-CM | POA: Diagnosis not present

## 2016-04-30 ENCOUNTER — Ambulatory Visit (INDEPENDENT_AMBULATORY_CARE_PROVIDER_SITE_OTHER): Payer: PPO | Admitting: Pharmacist Clinician (PhC)/ Clinical Pharmacy Specialist

## 2016-04-30 DIAGNOSIS — I4891 Unspecified atrial fibrillation: Secondary | ICD-10-CM | POA: Diagnosis not present

## 2016-04-30 DIAGNOSIS — Z7901 Long term (current) use of anticoagulants: Secondary | ICD-10-CM | POA: Diagnosis not present

## 2016-04-30 LAB — POCT INR: INR: 3.3

## 2016-05-05 ENCOUNTER — Other Ambulatory Visit: Payer: Self-pay | Admitting: Internal Medicine

## 2016-05-25 ENCOUNTER — Encounter: Payer: Self-pay | Admitting: Cardiology

## 2016-05-25 ENCOUNTER — Ambulatory Visit (INDEPENDENT_AMBULATORY_CARE_PROVIDER_SITE_OTHER): Payer: PPO | Admitting: Cardiology

## 2016-05-25 ENCOUNTER — Ambulatory Visit (INDEPENDENT_AMBULATORY_CARE_PROVIDER_SITE_OTHER): Payer: PPO | Admitting: Pharmacist Clinician (PhC)/ Clinical Pharmacy Specialist

## 2016-05-25 VITALS — BP 122/60 | HR 67 | Ht 71.0 in | Wt 182.6 lb

## 2016-05-25 DIAGNOSIS — I4891 Unspecified atrial fibrillation: Secondary | ICD-10-CM

## 2016-05-25 DIAGNOSIS — I2119 ST elevation (STEMI) myocardial infarction involving other coronary artery of inferior wall: Secondary | ICD-10-CM

## 2016-05-25 DIAGNOSIS — I10 Essential (primary) hypertension: Secondary | ICD-10-CM

## 2016-05-25 DIAGNOSIS — Z9229 Personal history of other drug therapy: Secondary | ICD-10-CM

## 2016-05-25 DIAGNOSIS — Z951 Presence of aortocoronary bypass graft: Secondary | ICD-10-CM | POA: Diagnosis not present

## 2016-05-25 DIAGNOSIS — I2510 Atherosclerotic heart disease of native coronary artery without angina pectoris: Secondary | ICD-10-CM

## 2016-05-25 DIAGNOSIS — I482 Chronic atrial fibrillation, unspecified: Secondary | ICD-10-CM

## 2016-05-25 DIAGNOSIS — Z7901 Long term (current) use of anticoagulants: Secondary | ICD-10-CM

## 2016-05-25 DIAGNOSIS — E785 Hyperlipidemia, unspecified: Secondary | ICD-10-CM

## 2016-05-25 LAB — POCT INR: INR: 2.5

## 2016-05-25 MED ORDER — VERAPAMIL HCL ER 120 MG PO TBCR: 120.0000 mg | EXTENDED_RELEASE_TABLET | Freq: Every day | ORAL | 3 refills | Status: DC

## 2016-05-25 MED ORDER — AMIODARONE HCL 200 MG PO TABS: 200.0000 mg | ORAL_TABLET | Freq: Every day | ORAL | 3 refills | Status: DC

## 2016-05-25 NOTE — Progress Notes (Signed)
PCP: Marylene Land, MD  Clinic Note: Chief Complaint  Patient presents with  . Follow-up    4-Six-month  . Coronary Artery Disease    History of CABG in 1997  . Atrial Fibrillation    Paroxysmal/persistent. On amiodarone    HPI: Timothy Mclean is a 75 y.o. male with a PMH below who presents today for ~4 month follow-up for CAD & PAF. - CABG in 1997 - had a false positive Myoview in January 2005. Has not done any further follow-up stress testing since. - Has PAF. On amiodarone.  Timothy Mclean was last seen on 01/18/2015. He was doing relatively well at that time, having been started back on low-dose verapamil for rapid A. Fib.  Recent Hospitalizations: ER visit September 29  Studies Reviewed: n/a  Interval History: [name] is doing relatively well today. His weight has finally become stable.  He is doing very well from a cardiac standpoint: he denies any sensation of recurrent A. fib with either rapid irregular heartbeats or palpitations. He does not notice the PVCs noted on EKG. No resting or exertional chest tightness, pressure or dyspnea. He states he has still been trying to walk a little more than he used to, but is now walking with a cane for balance issues.  He still has back and leg pains along with balance issues that limit his walking.[name] He has been doing physical therapy for his back and leg pains that have been doing a little better but is not in that program anymore.  He had a injury to his right lower leg at the saphenous vein graft resection site. He had a wound there that probably developed into a cellulitis that was treated with antibiotics and an ointment. It was swollen and red for a while, but is now improved. Otherwise he has not had any swelling in his feet or legs. No PND, orthopnea.  No lightheadedness, dizziness, weakness or syncope/near syncope. No TIA/amaurosis fugax symptoms. No melena, hematochezia, hematuria, or epstaxis. No claudication.  ROS: A  comprehensive was performed. Review of Systems  Constitutional: Negative for weight loss (Starting to normalize eating habits - dentures, but food still does not tast as good.).  HENT: Negative for nosebleeds.   Respiratory: Positive for cough (When his allergies kick in.  He had a bad spell about 2-3 weeks ago.).   Cardiovascular: Negative for palpitations and claudication.       Per history of present illness  Gastrointestinal: Negative for blood in stool and melena.  Genitourinary: Negative for hematuria.  Musculoskeletal: Positive for back pain.  Skin:       Wound on RLE @ SVG resection scar appears well healed.  Neurological: Positive for dizziness (occasional positional - but mostly balance issues).  Endo/Heme/Allergies: Positive for environmental allergies (Not active now). Does not bruise/bleed easily.  Psychiatric/Behavioral: Negative.   All other systems reviewed and are negative.  Past Medical History:  Diagnosis Date  . Allergy   . Anxiety   . Barrett's esophagus 03/09/2014  . CAD S/P percutaneous coronary angioplasty 1987   angioplasty and  STENT- Cx; MYOVIEW 1/'05: ? Ischemia vs. artifact --> sent for cath  . DDD (degenerative disc disease)   . Dyslipidemia, goal LDL below 70    Doing better off of Zocor, currently on Lescol   . Esophageal reflux   . GERD (gastroesophageal reflux disease)   . Gout, unspecified   . Iron deficiency anemia, unspecified   . Other and unspecified hyperlipidemia   .  Other B-complex deficiencies   . PAF (paroxysmal atrial fibrillation) ([place])    Last reported episode 2012; on amiodarone and anticoagulated on warfarin  . Personal history of colonic polyps   . S/P CABG x 4 1997   a) LIMA-LAD, SVG to OM, SVG-dRCA-RPL; b) FALSE + MYOVIEW --> CATH 1/'05: 100% LAD after widelly patent D1, all Cx OM branches 100%, pRCA 100%, SVG-PDA patent w/ retrograde filling of RPL, SVG-OM widely patent ~ normal OM, LIMA-LAD patent.  [name] Spinal stenosis   . ST  elevation myocardial infarction (STEMI) of inferior wall, subsequent episode of care ([place]) 1987, 1997   a) PTCA of Cx; b) CABG   . Type II or unspecified type diabetes mellitus without mention of complication, not stated as uncontrolled    no meds  . Ulcerative colitis, unspecified   . Unspecified essential hypertension    Past Surgical History:  Procedure Laterality Date  . CARDIAC CATHETERIZATION  08/17/2003   "False positive stress test " grafts patent; RCA proximal 100% LAD 100% occlusion after normal D1 with 80% ostial as SP1; circumflex patent but OM1 on to all occluded; EF 50-55%  . CARDIAC CATHETERIZATION  11/1995   Preop CABG: LAD 90% at D1, circumflex 100 and OM a 90% proximal, 80% distal  . Carotid Duplex Doppler  12/23/2009   Right&Left ICAs 0-49%, mildly abnormal study,   . CHOLECYSTECTOMY    . COLONOSCOPY    . CORONARY ARTERY BYPASS GRAFT  11/1995   LIMA-LAD, SVG to OM, SVG-dRCA-RPL  . ESOPHAGOGASTRODUODENOSCOPY    . FLEXIBLE SIGMOIDOSCOPY    . NM MYOCAR PERF EJECTION FRACTION  07/03/2003   FALSE POSITIVE TEST; Bruce protocol, negative test with scintigraphic evidence of inferoapical scar, diaphragmatic attenuation, moderate ischemia.  [name] NM MYOVIEW LTD  2005   Questionable ischemia that is likely either infarct versus artifact; normal  . TRANSTHORACIC ECHOCARDIOGRAM  12/03/2008   LVEF =>55% normal study     Prior to Admission medications   Medication Sig Start Date End Date Taking? Authorizing Provider  amiodarone (PACERONE) 200 MG tablet Take 1 tablet (200 mg total) by mouth daily. Please schedule appointment for refills. 04/03/16  Yes Leonie Man, MD  aspirin 81 MG tablet Take 81 mg by mouth daily.   Yes Historical Provider, MD  balsalazide (COLAZAL) 750 MG capsule TAKE 2 CAPSULES BY MOUTH TWICE A DAY 05/05/16  Yes Gatha Mayer, MD  BAYER CONTOUR TEST test strip  05/07/14  Yes Historical Provider, MD  CRESTOR 20 MG tablet Take 1 tablet by mouth daily. 07/23/14   Yes Historical Provider, MD  cyanocobalamin 2000 MCG tablet Take 2,000 mcg by mouth daily.   Yes Historical Provider, MD  levothyroxine (SYNTHROID, LEVOTHROID) 75 MCG tablet Take 1 tablet by mouth daily. Take 1 tab daily 01/08/15  Yes Historical Provider, MD  metFORMIN (GLUCOPHAGE) 500 MG tablet  02/06/16  Yes Historical Provider, MD  oxybutynin (DITROPAN) 5 MG tablet Take 5 mg by mouth 1 day or 1 dose.  10/02/11  Yes Historical Provider, MD  pantoprazole (PROTONIX) 40 MG tablet Take 1 tablet by mouth daily. 09/07/14  Yes Historical Provider, MD  vitamin D, CHOLECALCIFEROL, 400 UNITS tablet Take 400 Units by mouth daily.   Yes Historical Provider, MD  warfarin (COUMADIN) 5 MG tablet Take 1/2 to 1 tablet by mouth daily as directed by [facility]. 04/03/16  Yes Leonie Man, MD  Verapamil 120 mg PO daily  No Known Allergies  Social History   Social  History  . Marital status: Married    Spouse name: N/A  . Number of children: 1  . Years of education: N/A   Occupational History  . retired    Social History Main Topics  . Smoking status: Former Smoker    Packs/day: 1.00    Years: 32.00    Types: Cigarettes    Quit date: 08/27/1984  . Smokeless tobacco: Never Used  . Alcohol use No  . Drug use: No  . Sexual activity: Not on file   Other Topics Concern  . Not on file   Social History Narrative   He is a married father of one. Does not smoke and does not drink. He walks routinely and also does stationary bike. 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 Diabetes Mother   . Colon cancer Neg Hx   . Esophageal cancer Neg Hx   . Rectal cancer Neg Hx   . Stomach cancer Neg Hx     Wt Readings from Last 3 Encounters:  05/25/16 82.8 kg (182 lb 9.6 oz)  02/26/16 83.5 kg (184 lb)  08/29/15 81.6 kg (180 lb)    PHYSICAL EXAM BP 122/60    Pulse 67   Ht 5' 11"  (1.803 m)   Wt 82.8 kg (182 lb 9.6 oz)   BMI 25.47 kg/m  General appearance: A&O x 3, NAD; Healthy appearing. Has lost at least 8 pounds since last visit. Looks more fit. HEENT: [place]/AT, EOMI, MMM, anicteric sclera Neck: no adenopathy, no carotid bruit and no JVD Lungs: CTAB with minimal interstitial sounds & expiratory rhonchi; normal percussion bilaterally and non-labored Heart: Regular Rhythm - with ectopy, normal rate; S1& S2 normal, 1-2/6 SEM @ RUSB--> carotids; No click, rub or gallop with non-displaced PMI Abdomen: soft, non-tender; bowel sounds normal; no masses, no organomegaly; Extremities: extremities normal, atraumatic, no cyanosis, trace LE edema; Wound on RLE @ SVG resection scar appears well healed - not erythematous. Pulses: 2+ and symmetric;  Neurologic: Mental status: Alert, oriented, thought content appropriate; Grossly normal   Adult ECG Report  Rate: 66 ;  Rhythm: normal sinus rhythm, premature ventricular contractions (PVC) and Possible aberrantly conducted complexes. Nonspecific ST-T wave abnormalities - cannot exclude inferior ischemia.  Narrative Interpretation:  Sinus rhythm now present - otherwise stable   Other studies Reviewed: Additional studies/ records that were reviewed today include:  Recent Labs:  Apparently PCP is check lipids.   Has not check TSH.   ASSESSMENT / PLAN: Problem List Items Addressed This Visit    CAD S/P PTCA-PCI then CABG x4 - Primary (Chronic)    CABG in 1997 with grafts patent by cath in 2005. By patient choice after discussion with his former cardiologist, he has not had any stress testing done since 2005. Will only test if he becomes symptomatic.      Relevant Medications   verapamil (CALAN-SR) 120 MG CR tablet   amiodarone (PACERONE) 200 MG tablet   Other Relevant Orders   EKG 12-Lead (Completed)   S/P CABG x 4 (Chronic)   Relevant Orders   EKG 12-Lead (Completed)   Essential hypertension  (Chronic)    Well-controlled on low-dose verapamil.      Relevant Medications   verapamil (CALAN-SR) 120 MG CR tablet   amiodarone (PACERONE) 200 MG tablet   Other Relevant Orders   EKG 12-Lead (Completed)  ST elevation myocardial infarction (STEMI) of inferior wall, subsequent episode of care ([place]) (Chronic)    No active angina or heart failure symptoms. He had 2 MIs in 87 and 97. None since CABG. He has total occlusion of all native circumflex branches as well as LAD and RCA. However by last catheterization in 2005 grafts were patent. He has not had any stress tests done since his 2005 For "false positive" result. No plans for follow-up stress testing unless he has recurrent symptoms.      Relevant Medications   verapamil (CALAN-SR) 120 MG CR tablet   amiodarone (PACERONE) 200 MG tablet   Other Relevant Orders   EKG 12-Lead (Completed)   Chronic episodic atrial fibrillation ([place]); CHA2DS2-VASc Score =4, On Warfarin (Chronic)    Now in sinus rhythm. Essentially asymptomatic regardless of whether he is in A. fib or not. Continues to be on amiodarone mostly rhythm controlled, but with occasional recurrences of A. Fib.  Usually is paroxysmal and now rate controlled with addition of verapamil 120 mg. Have been intolerant of beta blockers due to fatigue in the past Needs annual LFTs and TSH checked. This is usually done by PCP along with lipid panel.  Has has stable PFTs for the last 2 years. We will wait another year to recheck unless symptoms occur. Stable third time, would probably not recheck.      Relevant Medications   verapamil (CALAN-SR) 120 MG CR tablet   amiodarone (PACERONE) 200 MG tablet   Other Relevant Orders   EKG 12-Lead (Completed)   Dyslipidemia, goal LDL below 70 (Chronic)    He is on Crestor. In the past, his PCP was following his lipids. Unfortunately I do not have the results.  In addition to having lipids checked he should have LFTs and TSH checked for his  amiodarone monitoring.      Relevant Medications   verapamil (CALAN-SR) 120 MG CR tablet   amiodarone (PACERONE) 200 MG tablet   Other Relevant Orders   EKG 12-Lead (Completed)   H/O amiodarone therapy (Chronic)    Needs annual LFT and TSH checks. Will hold off on PFTs until next year.      Relevant Orders   EKG 12-Lead (Completed)      Current medicines are reviewed at length with the patient today. (+/- concerns) none The following changes have been made: none  Studies ordered: Pulmonary function tests for October  Your physician wants you to follow-up in [place].   Glenetta Hew, M.D., M.S. Interventional Cardiologist   Pager # [phone]

## 2016-05-25 NOTE — Patient Instructions (Signed)
NO CHANGES WITH CURRENT MEDICATIONS OR TREATMENT    Your physician wants you to follow-up in: [place].You will receive a reminder letter in the mail two months in advance. If you don't receive a letter, please call our office to schedule the follow-up appointment.  If you need a refill on your cardiac medications before your next appointment, please call your pharmacy.

## 2016-05-26 ENCOUNTER — Ambulatory Visit (INDEPENDENT_AMBULATORY_CARE_PROVIDER_SITE_OTHER): Payer: PPO | Admitting: Podiatry

## 2016-05-26 ENCOUNTER — Encounter: Payer: Self-pay | Admitting: Podiatry

## 2016-05-26 VITALS — BP 126/66 | HR 77 | Resp 16

## 2016-05-26 DIAGNOSIS — B351 Tinea unguium: Secondary | ICD-10-CM | POA: Diagnosis not present

## 2016-05-26 DIAGNOSIS — M79675 Pain in left toe(s): Secondary | ICD-10-CM

## 2016-05-26 DIAGNOSIS — M79674 Pain in right toe(s): Secondary | ICD-10-CM

## 2016-05-26 NOTE — Progress Notes (Signed)
Patient ID: Timothy Mclean, male   DOB: 10/20/1940, 75 y.o.   MRN: [id number]   Subjective: This patient presents today for scheduled visit complaining of uncomfortable toenails walking wearing shoes and requests toenail debridement.  Patient is type II diabetic denies any history of foot ulceration, amputation claudication His wife is present in the treatment room today  Objective: Patient appears orientated 3  Vascular: No calf tenderness or calf edema bilaterally DP pulses 2/4 bilaterally PT pulses trace palpable  bilaterally Capillary reflex immediate bilaterally  Neurological: Sensation to 10 g monofilament wire intact 5/5 bilaterally Vibratory sensation nonreactive bilaterally Ankle reflexes equal and reactive bilaterally  Dermatological: No open skin lesions bilaterally Band-Aid on medial right lower extremity without surrounding erythema edema, drainage Skin texture and turgor appears within normal limits The toenails are hypertrophic, elongated, discolored and tender to direct palpation 6-10  Musculoskeletal: Hammertoe second bilaterally Unsteady gait pattern Ino restriction ankle, subtalar, midtarsal joints bilaterally  Assessment: Type II diabetic Decreased posterior tibial pulses bilaterally Decreased vibratory sensation  Plan:  The toenails 6-10 were debrided mechanically and let without any bleeding  Reappoint 3 months

## 2016-05-26 NOTE — Patient Instructions (Signed)
Diabetes and Foot Care Diabetes may cause you to have problems because of poor blood supply (circulation) to your feet and legs. This may cause the skin on your feet to become thinner, break easier, and heal more slowly. Your skin may become dry, and the skin may peel and crack. You may also have nerve damage in your legs and feet causing decreased feeling in them. You may not notice minor injuries to your feet that could lead to infections or more serious problems. Taking care of your feet is one of the most important things you can do for yourself.  HOME CARE INSTRUCTIONS  Wear shoes at all times, even in the house. Do not go barefoot. Bare feet are easily injured.  Check your feet daily for blisters, cuts, and redness. If you cannot see the bottom of your feet, use a mirror or ask someone for help.  Wash your feet with warm water (do not use hot water) and mild soap. Then pat your feet and the areas between your toes until they are completely dry. Do not soak your feet as this can dry your skin.  Apply a moisturizing lotion or petroleum jelly (that does not contain alcohol and is unscented) to the skin on your feet and to dry, brittle toenails. Do not apply lotion between your toes.  Trim your toenails straight across. Do not dig under them or around the cuticle. File the edges of your nails with an emery board or nail file.  Do not cut corns or calluses or try to remove them with medicine.  Wear clean socks or stockings every day. Make sure they are not too tight. Do not wear knee-high stockings since they may decrease blood flow to your legs.  Wear shoes that fit properly and have enough cushioning. To break in new shoes, wear them for just a few hours a day. This prevents you from injuring your feet. Always look in your shoes before you put them on to be sure there are no objects inside.  Do not cross your legs. This may decrease the blood flow to your feet.  If you find a minor scrape,  cut, or break in the skin on your feet, keep it and the skin around it clean and dry. These areas may be cleansed with mild soap and water. Do not cleanse the area with peroxide, alcohol, or iodine.  When you remove an adhesive bandage, be sure not to damage the skin around it.  If you have a wound, look at it several times a day to make sure it is healing.  Do not use heating pads or hot water bottles. They may burn your skin. If you have lost feeling in your feet or legs, you may not know it is happening until it is too late.  Make sure your health care provider performs a complete foot exam at least annually or more often if you have foot problems. Report any cuts, sores, or bruises to your health care provider immediately. SEEK MEDICAL CARE IF:   You have an injury that is not healing.  You have cuts or breaks in the skin.  You have an ingrown nail.  You notice redness on your legs or feet.  You feel burning or tingling in your legs or feet.  You have pain or cramps in your legs and feet.  Your legs or feet are numb.  Your feet always feel cold. SEEK IMMEDIATE MEDICAL CARE IF:   There is increasing redness,   swelling, or pain in or around a wound.  There is a red line that goes up your leg.  Pus is coming from a wound.  You develop a fever or as directed by your health care provider.  You notice a bad smell coming from an ulcer or wound.   This information is not intended to replace advice given to you by your health care provider. Make sure you discuss any questions you have with your health care provider.   Document Released: 07/03/2000 Document Revised: 03/08/2013 Document Reviewed: 12/13/2012 Elsevier Interactive Patient Education 2016 [employer]  

## 2016-05-27 ENCOUNTER — Encounter: Payer: Self-pay | Admitting: Cardiology

## 2016-05-27 NOTE — Assessment & Plan Note (Signed)
Well-controlled on low-dose verapamil.

## 2016-05-27 NOTE — Assessment & Plan Note (Signed)
He is on Crestor. In the past, his PCP was following his lipids. Unfortunately I do not have the results.  In addition to having lipids checked he should have LFTs and TSH checked for his amiodarone monitoring.

## 2016-05-27 NOTE — Assessment & Plan Note (Signed)
Needs annual LFT and TSH checks. Will hold off on PFTs until next year.

## 2016-05-27 NOTE — Assessment & Plan Note (Signed)
CABG in 1997 with grafts patent by cath in 2005. By patient choice after discussion with his former cardiologist, he has not had any stress testing done since 2005. Will only test if he becomes symptomatic.

## 2016-05-27 NOTE — Assessment & Plan Note (Signed)
No active angina or heart failure symptoms. He had 2 MIs in 87 and 97. None since CABG. He has total occlusion of all native circumflex branches as well as LAD and RCA. However by last catheterization in 2005 grafts were patent. He has not had any stress tests done since his 2005 For "false positive" result. No plans for follow-up stress testing unless he has recurrent symptoms.

## 2016-05-27 NOTE — Assessment & Plan Note (Addendum)
Now in sinus rhythm. Essentially asymptomatic regardless of whether he is in A. fib or not. Continues to be on amiodarone mostly rhythm controlled, but with occasional recurrences of A. Fib.  Usually is paroxysmal and now rate controlled with addition of verapamil 120 mg. Have been intolerant of beta blockers due to fatigue in the past Needs annual LFTs and TSH checked. This is usually done by PCP along with lipid panel.  Has has stable PFTs for the last 2 years. We will wait another year to recheck unless symptoms occur. Stable third time, would probably not recheck.

## 2016-06-04 ENCOUNTER — Other Ambulatory Visit: Payer: Self-pay | Admitting: Internal Medicine

## 2016-06-17 ENCOUNTER — Telehealth: Payer: Self-pay | Admitting: Cardiology

## 2016-06-17 NOTE — Telephone Encounter (Signed)
Last visit verapamil (CALAN-SR) 120 MG CR and amiodarone (PACERONE) 200 MG were refilled.

## 2016-06-17 NOTE — Telephone Encounter (Signed)
lmtcb-which medications?

## 2016-06-17 NOTE — Telephone Encounter (Signed)
New message  Blase Mess is calling to see if drug interaction info has been received  Please call back and advise

## 2016-06-18 NOTE — Telephone Encounter (Signed)
Follow up    [facility]ing from [facility] and gave her the fax numbers [phone] and [phone]

## 2016-06-18 NOTE — Telephone Encounter (Signed)
LM for Timothy Mclean at [facility] to [facility]

## 2016-06-18 NOTE — Telephone Encounter (Signed)
Timothy Mclean

## 2016-06-18 NOTE — Telephone Encounter (Signed)
Please note: Fax for [facility] office is [phone].

## 2016-06-22 ENCOUNTER — Ambulatory Visit (INDEPENDENT_AMBULATORY_CARE_PROVIDER_SITE_OTHER): Payer: PPO | Admitting: Pharmacist Clinician (PhC)/ Clinical Pharmacy Specialist

## 2016-06-22 DIAGNOSIS — I4891 Unspecified atrial fibrillation: Secondary | ICD-10-CM

## 2016-06-22 DIAGNOSIS — Z7901 Long term (current) use of anticoagulants: Secondary | ICD-10-CM | POA: Diagnosis not present

## 2016-06-22 LAB — POCT INR: INR: 1.8

## 2016-06-22 NOTE — Telephone Encounter (Signed)
[facility] What does she need?

## 2016-06-22 NOTE — Telephone Encounter (Signed)
Haley from [place] is calling for Michelle-states she will refax form and make it atten Sharyn Lull

## 2016-06-24 NOTE — Telephone Encounter (Signed)
Timothy Mclean received fax

## 2016-06-25 ENCOUNTER — Telehealth: Payer: Self-pay | Admitting: Cardiology

## 2016-06-25 NOTE — Telephone Encounter (Signed)
Spoke with wife she states that Timothy Mclean will not fill pt's Amiodarone due to interactions. Pt only has 2 weeks of medication left wife is getting worried that pt will run out. I reassured that they should get refill before pt runs out.  Review and no changes to pt's medications due to interactions-per Dr Ellyn Hack. Spoke with Sharon-RN she states that she faxed back to [place] today. Should be refilled and sent to pt before pt runs out.

## 2016-06-25 NOTE — Telephone Encounter (Signed)
New message  Pt has been prescibed verapamil 120 mg by Dr. Hart Robinsons  Pt also takes amiodarone 200 mg by Dr. Mayra [facility] [phone] states that these 2 meds together  Pt states that he has taken both of these for several years  Please call and advise

## 2016-07-09 ENCOUNTER — Other Ambulatory Visit: Payer: Self-pay | Admitting: Internal Medicine

## 2016-07-10 DIAGNOSIS — J4520 Mild intermittent asthma, uncomplicated: Secondary | ICD-10-CM | POA: Diagnosis not present

## 2016-07-10 DIAGNOSIS — J209 Acute bronchitis, unspecified: Secondary | ICD-10-CM | POA: Diagnosis not present

## 2016-07-17 DIAGNOSIS — Z125 Encounter for screening for malignant neoplasm of prostate: Secondary | ICD-10-CM | POA: Diagnosis not present

## 2016-07-21 ENCOUNTER — Ambulatory Visit (INDEPENDENT_AMBULATORY_CARE_PROVIDER_SITE_OTHER): Payer: PPO | Admitting: Pharmacist

## 2016-07-21 DIAGNOSIS — I4891 Unspecified atrial fibrillation: Secondary | ICD-10-CM | POA: Diagnosis not present

## 2016-07-21 DIAGNOSIS — Z7901 Long term (current) use of anticoagulants: Secondary | ICD-10-CM

## 2016-07-21 LAB — POCT INR: INR: 3.2

## 2016-07-27 ENCOUNTER — Other Ambulatory Visit: Payer: Self-pay | Admitting: Family Medicine

## 2016-07-27 ENCOUNTER — Ambulatory Visit
Admission: RE | Admit: 2016-07-27 | Discharge: 2016-07-27 | Disposition: A | Discharge status: Home / Self Care | Payer: PPO | Source: Ambulatory Visit | Attending: Family Medicine | Admitting: Family Medicine

## 2016-07-27 DIAGNOSIS — R05 Cough: Secondary | ICD-10-CM | POA: Diagnosis not present

## 2016-07-27 DIAGNOSIS — J4520 Mild intermittent asthma, uncomplicated: Secondary | ICD-10-CM | POA: Diagnosis not present

## 2016-07-27 DIAGNOSIS — I959 Hypotension, unspecified: Secondary | ICD-10-CM | POA: Diagnosis not present

## 2016-07-27 DIAGNOSIS — J209 Acute bronchitis, unspecified: Secondary | ICD-10-CM | POA: Diagnosis not present

## 2016-07-27 DIAGNOSIS — R059 Cough, unspecified: Secondary | ICD-10-CM

## 2016-07-28 ENCOUNTER — Telehealth: Payer: Self-pay | Admitting: Cardiology

## 2016-07-28 NOTE — Telephone Encounter (Signed)
Advice to hold verapamil relayed to caller, who voiced understanding and thanks. I've asked her to follow on his BP trends and call me back in 5-7 days, sooner if problems or new concerns.  Also informed her that I had raised a potential concern regarding Levaquin but that this OK to take w his current meds.

## 2016-07-28 NOTE — Telephone Encounter (Signed)
Spoke to wife. Notes patient's BPs were running low over weekend  Seen by PCP yesterday, BPs "still real low". Was 106/56 when checked yesterday. recently bout of bronchitis - on 2nd round of antibiotics. Took Z pack, steroids (prednisone taper),  Currently on levaquin, prescribed advair & proair. over weekend 73/49, yesterday seen by regular doc "still real low".  Pts symptoms dizziness, lethargy, persistent cough. Advised wife to encourage fluids - water, sports drinks, etc. Also to encourage him to eat regularly (noted his appetite was poor).   She initially wanted him seen today but after discussion OK to try home interventions, and see if improvement. Can schedule him in for APP visit later in the week.   Will check w [facility] to see if BP med can be held/decreased Any concerns for levaquin w his heart meds?

## 2016-07-28 NOTE — Telephone Encounter (Signed)
He can hold verapamil until BP returns to normal. Levaquin should be OK.  Martena Emanuele Martinique MD, [facility]

## 2016-07-28 NOTE — Telephone Encounter (Signed)
New message   Pt wife verbalized that she wants him seen today   Pt c/o BP issue: STAT if pt c/o blurred vision, one-sided weakness or slurred speech  1. What are your last 5 BP readings? 73/49 93/63  2. Are you having any other symptoms (ex. Dizziness, headache, blurred vision, passed out)? Dizziness  3. What is your BP issue? low

## 2016-07-31 ENCOUNTER — Telehealth: Payer: Self-pay | Admitting: Cardiology

## 2016-07-31 NOTE — Telephone Encounter (Signed)
SPOKE TO WIFE Recent blood pressure 07/29/16  94/70@ 10 am, 131/67 afternoon 07/30/16  Blood pressure   84/50  , 2pm 133/64  last week bronchitis  On antibiotic  No energy per wife.   RN informed wife continue to hold verapamil . Continue all other medications only other cardiac medications amiodarone  If patient is still having no energy by next  Wednesday contact primary again - to follow up bronchitis  wife verbalized understading

## 2016-07-31 NOTE — Telephone Encounter (Signed)
New message      Pt c/o BP issue: STAT if pt c/o blurred vision, one-sided weakness or slurred speech  1. What are your last 5 BP readings?1-10 94/70 at 10am, 131/67 at 10pm;  1-11 84/50 am, 133/64 pm; today 92/54 at 9am  2. Are you having any other symptoms (ex. Dizziness, headache, blurred vision, passed out)? Pt want to sleep all of the time 3. What is your BP issue?  bp is bouncing all over the place.  Please advise

## 2016-08-03 DIAGNOSIS — K5120 Ulcerative (chronic) proctitis without complications: Secondary | ICD-10-CM | POA: Diagnosis not present

## 2016-08-03 DIAGNOSIS — I2510 Atherosclerotic heart disease of native coronary artery without angina pectoris: Secondary | ICD-10-CM | POA: Diagnosis not present

## 2016-08-03 DIAGNOSIS — J209 Acute bronchitis, unspecified: Secondary | ICD-10-CM | POA: Diagnosis not present

## 2016-08-03 DIAGNOSIS — K2270 Barrett's esophagus without dysplasia: Secondary | ICD-10-CM | POA: Diagnosis not present

## 2016-08-03 DIAGNOSIS — Z0001 Encounter for general adult medical examination with abnormal findings: Secondary | ICD-10-CM | POA: Diagnosis not present

## 2016-08-03 DIAGNOSIS — N401 Enlarged prostate with lower urinary tract symptoms: Secondary | ICD-10-CM | POA: Diagnosis not present

## 2016-08-03 DIAGNOSIS — Z008 Encounter for other general examination: Secondary | ICD-10-CM | POA: Diagnosis not present

## 2016-08-03 DIAGNOSIS — E782 Mixed hyperlipidemia: Secondary | ICD-10-CM | POA: Diagnosis not present

## 2016-08-03 DIAGNOSIS — I480 Paroxysmal atrial fibrillation: Secondary | ICD-10-CM | POA: Diagnosis not present

## 2016-08-03 DIAGNOSIS — I959 Hypotension, unspecified: Secondary | ICD-10-CM | POA: Diagnosis not present

## 2016-08-03 DIAGNOSIS — I493 Ventricular premature depolarization: Secondary | ICD-10-CM | POA: Diagnosis not present

## 2016-08-03 DIAGNOSIS — J4520 Mild intermittent asthma, uncomplicated: Secondary | ICD-10-CM | POA: Diagnosis not present

## 2016-08-08 ENCOUNTER — Other Ambulatory Visit: Payer: Self-pay | Admitting: Internal Medicine

## 2016-08-13 ENCOUNTER — Other Ambulatory Visit: Payer: Self-pay | Admitting: Family Medicine

## 2016-08-13 DIAGNOSIS — R945 Abnormal results of liver function studies: Principal | ICD-10-CM

## 2016-08-13 DIAGNOSIS — R630 Anorexia: Secondary | ICD-10-CM

## 2016-08-13 DIAGNOSIS — R7989 Other specified abnormal findings of blood chemistry: Secondary | ICD-10-CM

## 2016-08-13 DIAGNOSIS — R634 Abnormal weight loss: Secondary | ICD-10-CM

## 2016-08-18 ENCOUNTER — Ambulatory Visit
Admission: RE | Admit: 2016-08-18 | Discharge: 2016-08-18 | Disposition: A | Discharge status: Home / Self Care | Payer: PPO | Source: Ambulatory Visit | Attending: Family Medicine | Admitting: Family Medicine

## 2016-08-18 DIAGNOSIS — R634 Abnormal weight loss: Secondary | ICD-10-CM

## 2016-08-18 DIAGNOSIS — R7989 Other specified abnormal findings of blood chemistry: Secondary | ICD-10-CM

## 2016-08-18 DIAGNOSIS — K5730 Diverticulosis of large intestine without perforation or abscess without bleeding: Secondary | ICD-10-CM | POA: Diagnosis not present

## 2016-08-18 DIAGNOSIS — R630 Anorexia: Secondary | ICD-10-CM

## 2016-08-18 DIAGNOSIS — R945 Abnormal results of liver function studies: Principal | ICD-10-CM

## 2016-08-18 MED ORDER — IOPAMIDOL (ISOVUE-300) INJECTION 61%
100.0000 mL | Freq: Once | INTRAVENOUS | Status: AC | PRN
  Administered 2016-08-18: 100 mL via INTRAVENOUS

## 2016-08-19 ENCOUNTER — Ambulatory Visit (INDEPENDENT_AMBULATORY_CARE_PROVIDER_SITE_OTHER): Payer: PPO | Admitting: Internal Medicine

## 2016-08-19 ENCOUNTER — Encounter: Payer: Self-pay | Admitting: Internal Medicine

## 2016-08-19 VITALS — BP 130/64 | HR 80 | Ht 69.75 in | Wt 184.5 lb

## 2016-08-19 DIAGNOSIS — K869 Disease of pancreas, unspecified: Secondary | ICD-10-CM | POA: Diagnosis not present

## 2016-08-19 DIAGNOSIS — K5100 Ulcerative (chronic) pancolitis without complications: Secondary | ICD-10-CM | POA: Diagnosis not present

## 2016-08-19 DIAGNOSIS — K2270 Barrett's esophagus without dysplasia: Secondary | ICD-10-CM

## 2016-08-19 MED ORDER — BALSALAZIDE DISODIUM 750 MG PO CAPS: ORAL_CAPSULE | ORAL | 11 refills | Status: DC

## 2016-08-19 NOTE — Progress Notes (Signed)
Timothy Mclean 76 y.o. [birth year] [id number]  Assessment & Plan:   Encounter Diagnoses  Name Primary?  . Ulcerative pancolitis without complication ([place]) Yes  . Pancreatic lesion   . Barrett's esophagus without dysplasia    Main issue for him is lesion in head of pancreas on CT - agree with MRI - suspect he will end up needing an endoscopic ultrasound for further evaluation unless MRI is negativewhich my partner Dr. Ardis Hughs can do - await MRI results and f/u from Dr. Sandi Mariscal  If MR is negative one possible cause for his signs and sxs could be amiodarone.  I appreciate the opportunity to care for this patient. Lovie Chol, MD   Subjective:   Chief Complaint: f/u ulcerative colitis  HPI  Here w/ wife - colitis ok - no diarrhea - wants refill on balsalazide. In past year has had fluctuating appetite, weight changes mostly down and vague sxs - + LFT increase. Gastric emptying and ultrasound ok. Just had CT scan abd/pelvis and it shows possible mass lesion in head of pancreas. No sig GERD sxs - has Barrett's Last EGD/colonoscopy 2016  Current Meds  Medication Sig  . amiodarone (PACERONE) 200 MG tablet Take 1 tablet (200 mg total) by mouth daily.  [name] aspirin 81 MG tablet Take 81 mg by mouth daily.  . balsalazide (COLAZAL) 750 MG capsule TAKE 2 CAPSULES BY MOUTH 2 TIMES DAILY  . BAYER CONTOUR TEST test strip   . CRESTOR 20 MG tablet Take 1 tablet by mouth daily.  . cyanocobalamin 2000 MCG tablet Take 2,000 mcg by mouth daily.  [name] levothyroxine (SYNTHROID, LEVOTHROID) 75 MCG tablet Take 1 tablet by mouth daily. Take 1 tab daily  . metFORMIN (GLUCOPHAGE) 500 MG tablet   . oxybutynin (DITROPAN) 5 MG tablet Take 5 mg by mouth 1 day or 1 dose.   . verapamil (CALAN-SR) 120 MG CR tablet Take 1 tablet (120 mg total) by mouth daily. (Patient taking differently: Take 120 mg by mouth daily. ON HOLD)  . vitamin D, CHOLECALCIFEROL, 400 UNITS tablet Take 400 Units by mouth daily.  [name]  warfarin (COUMADIN) 5 MG tablet Take 1/2 to 1 tablet by mouth daily as directed by [facility].  [name]     Allergies  Allergen Reactions  . Atorvastatin Other (See Comments)    Arthralgia, myalgia  . Fluvastatin Other (See Comments)    Arthralgia, myalgias  . Lisinopril Cough  . Metformin And Related Other (See Comments)    Anorexia, diarrhea, nausea, weight loss  . Pravastatin Other (See Comments)    Arthralgia, myalgias  . Simvastatin Other (See Comments)    myalgias  . Spiriva Handihaler [Tiotropium Bromide Monohydrate] Nausea Only   Past Medical History:  Diagnosis Date  . Allergy   . Anxiety   . Asthma    since 2013  . Barrett's esophagus 03/09/2014  . Bilateral lumbar radiculopathy    since 2013  . CAD S/P percutaneous coronary angioplasty 1987   angioplasty and  STENT- Cx; MYOVIEW 1/'05: ? Ischemia vs. artifact --> sent for cath  . Chronic bronchitis ([place])    since 2007  . COPD (chronic obstructive pulmonary disease) ([place])   . DDD (degenerative disc disease)   . Diverticulosis   . Dyslipidemia, goal LDL below 70    Doing better off of Zocor, currently on Lescol   . ED (erectile dysfunction)   . Elevated LFTs   . Esophageal reflux   . GERD (gastroesophageal reflux disease)   .  Gout, unspecified   . Hearing loss   . Helicobacter pylori gastritis 07/2009  . Herpes zoster 2009  . Hypothyroidism    since 2010  . Iron deficiency anemia, unspecified   . Other and unspecified hyperlipidemia   . Other B-complex deficiencies   . PAF (paroxysmal atrial fibrillation) ([place])    Last reported episode 2012; on amiodarone and anticoagulated on warfarin  . Personal history of colonic polyps   . Prostatitis    chronic ulcerative  . Pulmonary fibrosis ([place])    and bronchiectasis, since 2015  . Renal stone    Right  . S/P CABG x 4 1997   a) LIMA-LAD, SVG to OM, SVG-dRCA-RPL; b) FALSE + MYOVIEW --> CATH 1/'05: 100% LAD after widelly patent D1, all Cx OM branches 100%, pRCA  100%, SVG-PDA patent w/ retrograde filling of RPL, SVG-OM widely patent ~ normal OM, LIMA-LAD patent.  [name] Spinal stenosis   . ST elevation myocardial infarction (STEMI) of inferior wall, subsequent episode of care ([place]) 1987, 1997   a) PTCA of Cx; b) CABG   . Type II or unspecified type diabetes mellitus without mention of complication, not stated as uncontrolled    no meds  . Ulcerative colitis, unspecified   . Unspecified essential hypertension   . Vitamin D deficiency    Past Surgical History:  Procedure Laterality Date  . BLEPHAROPLASTY Bilateral    for ptosis  . CARDIAC CATHETERIZATION  08/17/2003   "False positive stress test " grafts patent; RCA proximal 100% LAD 100% occlusion after normal D1 with 80% ostial as SP1; circumflex patent but OM1 on to all occluded; EF 50-55%  . CARDIAC CATHETERIZATION  11/1995   Preop CABG: LAD 90% at D1, circumflex 100 and OM a 90% proximal, 80% distal  . Carotid Duplex Doppler  12/23/2009   Right&Left ICAs 0-49%, mildly abnormal study,   . CHOLECYSTECTOMY    . COLONOSCOPY    . CORONARY ARTERY BYPASS GRAFT  11/1995   LIMA-LAD, SVG to OM, SVG-dRCA-RPL  . ESOPHAGOGASTRODUODENOSCOPY    . FLEXIBLE SIGMOIDOSCOPY    . NM MYOCAR PERF EJECTION FRACTION  07/03/2003   FALSE POSITIVE TEST; Bruce protocol, negative test with scintigraphic evidence of inferoapical scar, diaphragmatic attenuation, moderate ischemia.  [name] NM MYOVIEW LTD  2005   Questionable ischemia that is likely either infarct versus artifact; normal  . TRANSTHORACIC ECHOCARDIOGRAM  12/03/2008   LVEF =>55% normal study     Review of Systems As above  Objective:   Physical Exam BP 130/64   Pulse 80   Ht 5' 9.75" (1.772 m)   Wt 184 lb 8 oz (83.7 kg)   BMI 26.66 kg/m  NAD Eyes anicteric  CT abd/pelvis w/ contrast 08/08/2016 IMPRESSION: 1. There is mild prominent inferior /posterior asked the of pancreatic head region with heterogeneous decreased enhancement. This measures at least  3.1 x 3.7 cm. This is highly suspicious for neoplastic process. There is some extension along the inferior mesenteric vein best seen in coronal image 49. Further correlation with unenhanced and enhanced MRI pancreatic lesion protocol is recommended to exclude pancreatic head malignancy. Less likely focal inflammatory process. 2. Atherosclerotic calcifications of abdominal aorta coronary arteries and iliac arteries. No aortic aneurysm. 3. No small bowel or colonic obstruction. 4. No pericecal inflammation. Moderate stool noted in right colon transverse colon and proximal descending colon. No distal colonic obstruction. Few diverticula are noted in proximal sigmoid colon. No evidence of acute colitis or diverticulitis. 5. Osteopenia and degenerative changes  thoracic spine. 6. Borderline enlarged prostate gland. 7. No hydronephrosis or hydroureter. Bilateral renal symmetrical excretion. 8. Borderline enlarged portal caval lymph node measures 7 mm please see axial image 29. Metastatic disease cannot be excluded. A preaortic lymph node axial image 24 measures 8 mm.   15 minutes time spent with patient > half in counseling coordination of care

## 2016-08-19 NOTE — Patient Instructions (Addendum)
   We have sent the following medications to your pharmacy for you to pick up at your convenience: colazal   Dr Carlean Purl will be on stand by waiting to hear your MRI results from Dr Sandi Mariscal.    Follow up with Dr Carlean Purl in a year or sooner if needed.     I appreciate the opportunity to care for you. Ronney Lion, [facility]

## 2016-08-21 ENCOUNTER — Other Ambulatory Visit: Payer: Self-pay | Admitting: Family Medicine

## 2016-08-21 DIAGNOSIS — K869 Disease of pancreas, unspecified: Secondary | ICD-10-CM

## 2016-08-22 ENCOUNTER — Ambulatory Visit
Admission: RE | Admit: 2016-08-22 | Discharge: 2016-08-22 | Disposition: A | Discharge status: Home / Self Care | Payer: PPO | Source: Ambulatory Visit | Attending: Family Medicine | Admitting: Family Medicine

## 2016-08-22 DIAGNOSIS — K869 Disease of pancreas, unspecified: Secondary | ICD-10-CM

## 2016-08-22 DIAGNOSIS — K8689 Other specified diseases of pancreas: Secondary | ICD-10-CM | POA: Diagnosis not present

## 2016-08-22 MED ORDER — GADOBENATE DIMEGLUMINE 529 MG/ML IV SOLN
20.0000 mL | Freq: Once | INTRAVENOUS | Status: AC | PRN
  Administered 2016-08-22: 16 mL via INTRAVENOUS

## 2016-08-25 ENCOUNTER — Ambulatory Visit (INDEPENDENT_AMBULATORY_CARE_PROVIDER_SITE_OTHER): Payer: PPO | Admitting: Podiatry

## 2016-08-25 ENCOUNTER — Encounter: Payer: Self-pay | Admitting: Podiatry

## 2016-08-25 ENCOUNTER — Telehealth: Payer: Self-pay | Admitting: Cardiology

## 2016-08-25 DIAGNOSIS — B351 Tinea unguium: Secondary | ICD-10-CM

## 2016-08-25 DIAGNOSIS — E1151 Type 2 diabetes mellitus with diabetic peripheral angiopathy without gangrene: Secondary | ICD-10-CM

## 2016-08-25 NOTE — Telephone Encounter (Signed)
Received records from Dr Derinda Late for appointment on 08/31/16 with Dr Ellyn Hack.  Records put with Dr Allison Quarry schedule for 08/31/16. lp

## 2016-08-25 NOTE — Patient Instructions (Signed)

## 2016-08-25 NOTE — Progress Notes (Signed)
Patient ID: Timothy Mclean, male   DOB: 30-Sep-1940, 76 y.o.   MRN: [id number]    Subjective: This patient presents today for scheduled visit complaining of uncomfortable toenails walking wearing shoes and requests toenail debridement.  Patient is type II diabetic denies any history of foot ulceration, amputation claudication His wife is present in the treatment room today  Objective: Patient appears orientated 3  Vascular: No calf tenderness or calf edema bilaterally DP pulses 2/4 bilaterally PT pulses trace palpable  bilaterally Capillary reflex immediate bilaterally  Neurological: Sensation to 10 g monofilament wire intact 5/5 bilaterally Vibratory sensation nonreactive bilaterally Ankle reflexes equal and reactive bilaterally  Dermatological: No open skin lesions bilaterally Band-Aid on medial right lower extremity without surrounding erythema edema, drainage Skin texture and turgor appears within normal limits The toenails are hypertrophic, elongated, discolored and tender to direct palpation 6-10  Musculoskeletal: Hammertoe second bilaterally Unsteady gait pattern Ino restriction ankle, subtalar, midtarsal joints bilaterally  Assessment: Type II diabetic Decreased posterior tibial pulses bilaterally Diabetic peripheral arterial disease Decreased vibratory sensation Diabetic peripheral neuropathy  Plan:  The toenails 6-10 were debrided mechanically and let without any bleeding  Reappoint 3 months

## 2016-08-31 ENCOUNTER — Ambulatory Visit (INDEPENDENT_AMBULATORY_CARE_PROVIDER_SITE_OTHER): Payer: PPO | Admitting: Cardiology

## 2016-08-31 ENCOUNTER — Encounter: Payer: Self-pay | Admitting: Cardiology

## 2016-08-31 ENCOUNTER — Ambulatory Visit (INDEPENDENT_AMBULATORY_CARE_PROVIDER_SITE_OTHER): Payer: PPO | Admitting: Pharmacist Clinician (PhC)/ Clinical Pharmacy Specialist

## 2016-08-31 VITALS — BP 114/68 | HR 77 | Ht 69.75 in | Wt 185.0 lb

## 2016-08-31 DIAGNOSIS — I952 Hypotension due to drugs: Secondary | ICD-10-CM | POA: Diagnosis not present

## 2016-08-31 DIAGNOSIS — I10 Essential (primary) hypertension: Secondary | ICD-10-CM

## 2016-08-31 DIAGNOSIS — I2510 Atherosclerotic heart disease of native coronary artery without angina pectoris: Secondary | ICD-10-CM | POA: Diagnosis not present

## 2016-08-31 DIAGNOSIS — I4891 Unspecified atrial fibrillation: Secondary | ICD-10-CM

## 2016-08-31 DIAGNOSIS — I482 Chronic atrial fibrillation, unspecified: Secondary | ICD-10-CM

## 2016-08-31 DIAGNOSIS — Z7901 Long term (current) use of anticoagulants: Secondary | ICD-10-CM

## 2016-08-31 DIAGNOSIS — Z951 Presence of aortocoronary bypass graft: Secondary | ICD-10-CM | POA: Diagnosis not present

## 2016-08-31 DIAGNOSIS — I2119 ST elevation (STEMI) myocardial infarction involving other coronary artery of inferior wall: Secondary | ICD-10-CM

## 2016-08-31 DIAGNOSIS — E785 Hyperlipidemia, unspecified: Secondary | ICD-10-CM

## 2016-08-31 DIAGNOSIS — R29898 Other symptoms and signs involving the musculoskeletal system: Secondary | ICD-10-CM

## 2016-08-31 LAB — POCT INR: INR: 2.2

## 2016-08-31 NOTE — Progress Notes (Signed)
PCP: Marylene Land, MD  Clinic Note: Chief Complaint  Patient presents with  . 3 month visit    low BP - verapamil 134m on hold per triage calls  . Coronary Artery Disease    Status post CABG  . Atrial Fibrillation    HPI: Timothy LIGMANis a 76y.o. male with a PMH below who presents today for 3 month follow-up for CAD and PAF. CABG in 1997. A false positive Myoview in January '05 and no further testing. He's been on amiodarone for A. fib with warfarin for anticoagulation..Timothy Chinawas last seen on 05/25/2016. He was doing relatively well with no major cardiac issues.   Recent Hospitalizations: n/a  Studies Reviewed: n/a  CT of the abdomen and pelvis from 08/18/2016: Mild lesion noted in the pancreatic head 3.1 x 3.7 cm. Highly suspicious for neoplasm. Abdominal aortic atherosclerosis noted but no aneurysm.  MRI of the abdomen 08/22/2016: No pancreatic head mass noted. Referring to CT scan this would suggest variable fatty infiltration and a small anterior distal duodenal diverticulum. Low risk 1 m cystic pancreatic lesion. Left-sided IVC noted  Interval History: CBaleypresents today for follow-up after having had some low blood pressure episodes associated with fatigue and dizziness. His verapamil was actually held. Blood pressure has been an ongoing issue since about January - of note he has been quite ill recently having had a bad cold/cough bronchitis episode now completing antibiotics. It was during this time his blood pressure was extremely low. His weight is remaining stable and has not really had heart failure symptoms of PND, orthopnea or edema. He just been dealing with his cold making him somewhat short of breath.  Now he is recovering from this he is feeling better. He initially was using a rolling walker his legs were so weak, but I think now is moving up to a cane. He says his legs are really weak when he is walking but don't believe this to hurt him. He  denies any chest tightness or pressure with rest or exertion, but has not been doing much exertion of late. Orthostatic dizziness since stopping verapamil. From his PCP, there seems to be some concern of worsening pulmonary function and status and questionably could stop amiodarone.  No palpitations, lightheadedness, dizziness, weakness or syncope/near syncope. No TIA/amaurosis fugax symptoms.  He uses a cane for balance.   ROS: A comprehensive was performed. Review of Systems  Constitutional: Positive for malaise/fatigue (Improving now that his cold is getting better) and weight loss (Weight is now stable.).  HENT: Positive for congestion and sinus pain. Negative for sore throat.   Respiratory: Positive for cough (Improving) and shortness of breath.   Cardiovascular:       Per history of present illness  Gastrointestinal: Negative for blood in stool and melena.  Genitourinary: Negative for hematuria.  Musculoskeletal: Positive for joint pain.       Leg weakness  Skin: Negative.   Neurological: Negative for dizziness.       Has history of known lumbar radiculopathy. Not a good surgical candidate.  Psychiatric/Behavioral: Positive for memory loss. Negative for depression. The patient is not nervous/anxious and does not have insomnia.   All other systems reviewed and are negative.   Past Medical History:  Diagnosis Date  . Allergy   . Anxiety   . Asthma    since 2013  . Barrett's esophagus 03/09/2014  . Bilateral lumbar radiculopathy    since 2013  .  CAD S/P percutaneous coronary angioplasty 1987   angioplasty and  STENT- Cx; MYOVIEW 1/'05: ? Ischemia vs. artifact --> sent for cath  . Chronic bronchitis ([place])    since 2007  . COPD (chronic obstructive pulmonary disease) ([place])   . DDD (degenerative disc disease)   . Diverticulosis   . Dyslipidemia, goal LDL below 70    Doing better off of Zocor, currently on Lescol   . ED (erectile dysfunction)   . Elevated LFTs   .  Esophageal reflux   . GERD (gastroesophageal reflux disease)   . Gout, unspecified   . Hearing loss   . Helicobacter pylori gastritis 07/2009  . Herpes zoster 2009  . Hypothyroidism    since 2010  . Iron deficiency anemia, unspecified   . Other and unspecified hyperlipidemia   . Other B-complex deficiencies   . PAF (paroxysmal atrial fibrillation) ([place])    Last reported episode 2012; on amiodarone and anticoagulated on warfarin  . Personal history of colonic polyps   . Prostatitis    chronic ulcerative  . Pulmonary fibrosis ([place])    and bronchiectasis, since 2015  . Renal stone    Right  . S/P CABG x 4 1997   a) LIMA-LAD, SVG to OM, SVG-dRCA-RPL; b) FALSE + MYOVIEW --> CATH 1/'05: 100% LAD after widelly patent D1, all Cx OM branches 100%, pRCA 100%, SVG-PDA patent w/ retrograde filling of RPL, SVG-OM widely patent ~ normal OM, LIMA-LAD patent.  [name] Spinal stenosis   . ST elevation myocardial infarction (STEMI) of inferior wall, subsequent episode of care ([place]) 1987, 1997   a) PTCA of Cx; b) CABG   . Type II or unspecified type diabetes mellitus without mention of complication, not stated as uncontrolled    no meds  . Ulcerative colitis, unspecified   . Unspecified essential hypertension   . Vitamin D deficiency     Past Surgical History:  Procedure Laterality Date  . BLEPHAROPLASTY Bilateral    for ptosis  . CARDIAC CATHETERIZATION  08/17/2003   "False positive stress test " grafts patent; RCA proximal 100% LAD 100% occlusion after normal D1 with 80% ostial as SP1; circumflex patent but OM1 on to all occluded; EF 50-55%  . CARDIAC CATHETERIZATION  11/1995   Preop CABG: LAD 90% at D1, circumflex 100 and OM a 90% proximal, 80% distal  . Carotid Duplex Doppler  12/23/2009   Right&Left ICAs 0-49%, mildly abnormal study,   . CHOLECYSTECTOMY    . COLONOSCOPY    . CORONARY ARTERY BYPASS GRAFT  11/1995   LIMA-LAD, SVG to OM, SVG-dRCA-RPL  . ESOPHAGOGASTRODUODENOSCOPY    . FLEXIBLE  SIGMOIDOSCOPY    . NM MYOCAR PERF EJECTION FRACTION  07/03/2003   FALSE POSITIVE TEST; Bruce protocol, negative test with scintigraphic evidence of inferoapical scar, diaphragmatic attenuation, moderate ischemia.  [name] NM MYOVIEW LTD  2005   Questionable ischemia that is likely either infarct versus artifact; normal  . TRANSTHORACIC ECHOCARDIOGRAM  12/03/2008   LVEF =>55% normal study    Current Meds  Medication Sig  . verapamil (CALAN-SR) 120 MG CR tablet Take 1 tablet by mouth daily if you heart rate is greater than 110 and you are symptomatic    Allergies  Allergen Reactions  . Atorvastatin Other (See Comments)    Arthralgia, myalgia  . Fluvastatin Other (See Comments)    Arthralgia, myalgias  . Lisinopril Cough  . Metformin And Related Other (See Comments)    Anorexia, diarrhea, nausea, weight loss  .  Pravastatin Other (See Comments)    Arthralgia, myalgias  . Simvastatin Other (See Comments)    myalgias  . Spiriva Handihaler [Tiotropium Bromide Monohydrate] Nausea Only    Social History   Social History  . Marital status: Married    Spouse name: N/A  . Number of children: 1  . Years of education: N/A   Occupational History  . retired    Social History Main Topics  . Smoking status: Former Smoker    Packs/day: 1.00    Years: 32.00    Types: Cigarettes    Quit date: 08/27/1984  . Smokeless tobacco: Never Used  . Alcohol use No  . Drug use: No  . Sexual activity: Not Asked   Other Topics Concern  . None   Social History Narrative   He is a married father of one. Does not smoke and does not drink. He walks routinely and also does stationary bike. 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  Wt Readings from Last 3 Encounters:  08/31/16 83.9 kg (185 lb)  08/19/16 83.7 kg (184 lb 8 oz)  05/25/16 82.8 kg (182 lb 9.6 oz)    PHYSICAL EXAM BP 114/68   Pulse 77   Ht 5' 9.75" (1.772 m)   Wt 83.9 kg (185 lb)   BMI 26.74 kg/m  General appearance: A&O x 3, NAD; Healthy appearing. Has lost at least 8 pounds since last visit. Looks more fit. HEENT: [place]/AT, EOMI, MMM, anicteric sclera Neck: no adenopathy, no carotid bruit and no JVD Lungs: CTAB with minimal interstitial sounds & expiratory rhonchi; normal percussion bilaterally and non-labored Heart: Regular Rhythm - with ectopy, normal rate; S1& S2 normal, 1-2/6 SEM @ RUSB--> carotids; No click, rub or gallop with non-displaced PMI Abdomen: soft, non-tender; bowel sounds normal; no masses, no organomegaly; Extremities: extremities normal, atraumatic, no cyanosis, trace LE edema; Wound on RLE @ SVG resection scar appears well healed - not erythematous. Pulses: 2+ and symmetric;  Neurologic: Mental status: Alert, oriented, thought content appropriate; Grossly normal    Adult ECG Report  Rate: 77 ;  Rhythm: atrial fibrillation and Nonspecific ST-T wave changes. Otherwise normal intervals and durations.;   Narrative Interpretation: Stable EKG   Other studies Reviewed: Additional studies/ records that were reviewed today include:  Recent Labs:  No results found for: CHOL, HDL, LDLCALC, LDLDIRECT, TRIG, CHOLHDL - followed by PCP   ASSESSMENT / PLAN: Problem List Items Addressed This Visit    Anticoagulant long-term use (Chronic)    Stable on warfarin with no bleeding issues. Managed by our clinic.      CAD S/P PTCA-PCI then CABG x4 - Primary (Chronic)    CABG in 97 after angioplasty of circumflex and 87. Grafts were patent in 2005. No plans for follow-up stress test. Continue aspirin and statin Not on beta blocker or ACE inhibitor for reasons  of hypotension.      Relevant Medications   verapamil (CALAN-SR) 120 MG CR tablet   Chronic episodic atrial fibrillation (HCC); CHA2DS2-VASc Score =4, On Warfarin (Chronic)    He is back in A. fib today, rate control but no active symptoms. With some questionable LFT elevation and  pulmonary symptoms, I would like to try off of amiodarone and see how he does as far as rate control goes. We can use when necessary verapamil for rapid heart rates. But if he has recurrent symptoms or rapid A. fib, then we may need to consider options such as sotalol or Tikosyn which would require EP evaluation. Since discontinuing amiodarone, no need to recheck PFTs  Continue warfarin for anticoagulation. No active bleeding.      Relevant Medications   verapamil (CALAN-SR) 120 MG CR tablet   Dyslipidemia, goal LDL below 70 (Chronic)    Now on Crestor. Labs monitored to PCP. With his leg weakness, I don't think this is related to statins, because of an ongoing for a long time.      Relevant Medications   verapamil (CALAN-SR) 120 MG CR tablet   Essential hypertension (Chronic)    At this point, blood pressure stable off of any medications. Would not restart antihypertensives.      Relevant Medications   verapamil (CALAN-SR) 120 MG CR tablet   Hypotension due to drugs    Much improved having stopped verapamil. He basically just does not tolerate any antihypertensives. He had been on ARB in the past, but did not tolerate that, the blocks was stopped due to fatigue and hypotension and now verapamil was also stopped. At this point, I would just simply allow his blood pressure to go untreated as he is not showing any signs of hypertension.      Relevant Medications   verapamil (CALAN-SR) 120 MG CR tablet   S/P CABG x 4 (Chronic)   Relevant Orders   EKG 12-Lead (Completed)   ST elevation myocardial infarction (STEMI) of inferior wall, subsequent episode of care ([place]) (Chronic)    No recurrent angina or heart  failure symptoms. His last MI back in [place] was in 1997. Grafts are patent as of 2005 despite abnormal Myoview.. We are not doing follow-up stress tests unless there are symptoms.      Relevant Medications   verapamil (CALAN-SR) 120 MG CR tablet   Weakness of lower extremity    Bilateral leg weakness but a patient with known coronary disease. He hasn't had leg Dopplers checked in a long time. I would just make sure he has no claudication component.      Relevant Orders   VAS [place] LOWER EXTREMITY ARTERIAL DUPLEX      Current medicines are reviewed at length with the patient today. (+/- concerns) Should he restart verapamil The following changes have been made: Stop verapamil. Only use for when necessary, also stop amiodarone  Patient Instructions  Your physician has requested that you have a lower extremity arterial duplex. This test is an ultrasound of the arteries in the legs. It looks at arterial blood flow in the legsAllow one hour for Lower Arterial scans. There are no restrictions or special instructions  Your physician has recommended you make the following change in your medication:  -- STOP amiodarone -- take verapamil only as needed if heart rate is greater than 110 beats/minute + symptoms  Your physician recommends that you schedule a follow-up appointment in: TWO MONTHS with PA/NP  Your physician recommends that you schedule a follow-up appointment in: June or July w/Dr. Ellyn Hack      Studies Ordered:   Orders Placed This Encounter  Procedures  . EKG 12-Lead      Glenetta Hew, M.D., M.S. Interventional Cardiologist   Pager # [phone] Phone # [phone] [address]  [place], [place] [address]

## 2016-08-31 NOTE — Patient Instructions (Addendum)
Your physician has requested that you have a lower extremity arterial duplex. This test is an ultrasound of the arteries in the legs. It looks at arterial blood flow in the legsAllow one hour for Lower Arterial scans. There are no restrictions or special instructions  Your physician has recommended you make the following change in your medication:  -- STOP amiodarone -- take verapamil only as needed if heart rate is greater than 110 beats/minute + symptoms  Your physician recommends that you schedule a follow-up appointment in: TWO MONTHS with PA/NP  Your physician recommends that you schedule a follow-up appointment in: June or July w/Dr. Ellyn Hack

## 2016-09-07 ENCOUNTER — Encounter: Payer: Self-pay | Admitting: Cardiology

## 2016-09-07 DIAGNOSIS — R29898 Other symptoms and signs involving the musculoskeletal system: Secondary | ICD-10-CM | POA: Insufficient documentation

## 2016-09-07 NOTE — Assessment & Plan Note (Signed)
At this point, blood pressure stable off of any medications. Would not restart antihypertensives.

## 2016-09-07 NOTE — Assessment & Plan Note (Signed)
He is back in A. fib today, rate control but no active symptoms. With some questionable LFT elevation and pulmonary symptoms, I would like to try off of amiodarone and see how he does as far as rate control goes. We can use when necessary verapamil for rapid heart rates. But if he has recurrent symptoms or rapid A. fib, then we may need to consider options such as sotalol or Tikosyn which would require EP evaluation. Since discontinuing amiodarone, no need to recheck PFTs  Continue warfarin for anticoagulation. No active bleeding.

## 2016-09-07 NOTE — Assessment & Plan Note (Signed)
CABG in 97 after angioplasty of circumflex and 87. Grafts were patent in 2005. No plans for follow-up stress test. Continue aspirin and statin Not on beta blocker or ACE inhibitor for reasons of hypotension.

## 2016-09-07 NOTE — Assessment & Plan Note (Signed)
Stable on warfarin with no bleeding issues. Managed by our clinic.

## 2016-09-07 NOTE — Assessment & Plan Note (Signed)
Now on Crestor. Labs monitored to PCP. With his leg weakness, I don't think this is related to statins, because of an ongoing for a long time.

## 2016-09-07 NOTE — Assessment & Plan Note (Signed)
Bilateral leg weakness but a patient with known coronary disease. He hasn't had leg Dopplers checked in a long time. I would just make sure he has no claudication component.

## 2016-09-07 NOTE — Assessment & Plan Note (Signed)
Much improved having stopped verapamil. He basically just does not tolerate any antihypertensives. He had been on ARB in the past, but did not tolerate that, the blocks was stopped due to fatigue and hypotension and now verapamil was also stopped. At this point, I would just simply allow his blood pressure to go untreated as he is not showing any signs of hypertension.

## 2016-09-07 NOTE — Assessment & Plan Note (Signed)
No recurrent angina or heart failure symptoms. His last MI back in [place] was in 1997. Grafts are patent as of 2005 despite abnormal Myoview.. We are not doing follow-up stress tests unless there are symptoms.

## 2016-09-11 ENCOUNTER — Other Ambulatory Visit: Payer: Self-pay | Admitting: Cardiology

## 2016-09-11 DIAGNOSIS — R29898 Other symptoms and signs involving the musculoskeletal system: Secondary | ICD-10-CM

## 2016-09-17 ENCOUNTER — Ambulatory Visit (HOSPITAL_COMMUNITY)
Admission: RE | Admit: 2016-09-17 | Discharge: 2016-09-17 | Disposition: A | Discharge status: Home / Self Care | Payer: PPO | Source: Ambulatory Visit | Attending: Cardiology | Admitting: Cardiology

## 2016-09-17 DIAGNOSIS — J449 Chronic obstructive pulmonary disease, unspecified: Secondary | ICD-10-CM | POA: Insufficient documentation

## 2016-09-17 DIAGNOSIS — Z87891 Personal history of nicotine dependence: Secondary | ICD-10-CM | POA: Insufficient documentation

## 2016-09-17 DIAGNOSIS — I10 Essential (primary) hypertension: Secondary | ICD-10-CM | POA: Insufficient documentation

## 2016-09-17 DIAGNOSIS — E785 Hyperlipidemia, unspecified: Secondary | ICD-10-CM | POA: Insufficient documentation

## 2016-09-17 DIAGNOSIS — R29898 Other symptoms and signs involving the musculoskeletal system: Secondary | ICD-10-CM | POA: Diagnosis not present

## 2016-09-17 DIAGNOSIS — Z951 Presence of aortocoronary bypass graft: Secondary | ICD-10-CM | POA: Diagnosis not present

## 2016-09-17 DIAGNOSIS — I2510 Atherosclerotic heart disease of native coronary artery without angina pectoris: Secondary | ICD-10-CM | POA: Insufficient documentation

## 2016-09-17 DIAGNOSIS — E119 Type 2 diabetes mellitus without complications: Secondary | ICD-10-CM | POA: Insufficient documentation

## 2016-09-21 DIAGNOSIS — E782 Mixed hyperlipidemia: Secondary | ICD-10-CM | POA: Diagnosis not present

## 2016-09-21 DIAGNOSIS — E119 Type 2 diabetes mellitus without complications: Secondary | ICD-10-CM | POA: Diagnosis not present

## 2016-09-23 ENCOUNTER — Telehealth: Payer: Self-pay | Admitting: *Deleted

## 2016-09-23 NOTE — Telephone Encounter (Signed)
Left message to call back in regards to lower extremities doppler results

## 2016-09-23 NOTE — Telephone Encounter (Signed)
-----   Message from Leonie Man, MD sent at 09/22/2016 10:25 PM EST ----- Lower Extremity ABIs look essentially normal bilaterally - would indicate that there is no flow-limiting lesions.  Glenetta Hew, MD   Pls fwd to PCP: Dr. Sandi Mariscal.

## 2016-09-24 NOTE — Telephone Encounter (Signed)
Returned call to patient's wife. Notified her of results (OK per DPR)

## 2016-09-24 NOTE — Telephone Encounter (Signed)
F/u Message  Pt wife returning call about pt doppler results. Please call back to discuss

## 2016-10-05 ENCOUNTER — Ambulatory Visit (INDEPENDENT_AMBULATORY_CARE_PROVIDER_SITE_OTHER): Payer: PPO | Admitting: Pharmacist

## 2016-10-05 DIAGNOSIS — R740 Nonspecific elevation of levels of transaminase and lactic acid dehydrogenase [LDH]: Secondary | ICD-10-CM | POA: Diagnosis not present

## 2016-10-05 DIAGNOSIS — Z7901 Long term (current) use of anticoagulants: Secondary | ICD-10-CM | POA: Diagnosis not present

## 2016-10-05 DIAGNOSIS — E119 Type 2 diabetes mellitus without complications: Secondary | ICD-10-CM | POA: Diagnosis not present

## 2016-10-05 DIAGNOSIS — J4520 Mild intermittent asthma, uncomplicated: Secondary | ICD-10-CM | POA: Diagnosis not present

## 2016-10-05 DIAGNOSIS — E782 Mixed hyperlipidemia: Secondary | ICD-10-CM | POA: Diagnosis not present

## 2016-10-05 DIAGNOSIS — I4891 Unspecified atrial fibrillation: Secondary | ICD-10-CM | POA: Diagnosis not present

## 2016-10-05 DIAGNOSIS — I480 Paroxysmal atrial fibrillation: Secondary | ICD-10-CM | POA: Diagnosis not present

## 2016-10-05 DIAGNOSIS — I959 Hypotension, unspecified: Secondary | ICD-10-CM | POA: Diagnosis not present

## 2016-10-05 DIAGNOSIS — D485 Neoplasm of uncertain behavior of skin: Secondary | ICD-10-CM | POA: Diagnosis not present

## 2016-10-05 DIAGNOSIS — N183 Chronic kidney disease, stage 3 (moderate): Secondary | ICD-10-CM | POA: Diagnosis not present

## 2016-10-05 LAB — POCT INR: INR: 4.6

## 2016-10-15 ENCOUNTER — Other Ambulatory Visit: Payer: Self-pay | Admitting: Dermatology

## 2016-10-15 DIAGNOSIS — C44622 Squamous cell carcinoma of skin of right upper limb, including shoulder: Secondary | ICD-10-CM | POA: Diagnosis not present

## 2016-10-15 DIAGNOSIS — L858 Other specified epidermal thickening: Secondary | ICD-10-CM | POA: Diagnosis not present

## 2016-10-26 ENCOUNTER — Ambulatory Visit (INDEPENDENT_AMBULATORY_CARE_PROVIDER_SITE_OTHER): Payer: PPO | Admitting: Neurology

## 2016-10-26 ENCOUNTER — Encounter: Payer: Self-pay | Admitting: Neurology

## 2016-10-26 VITALS — BP 115/58 | HR 59 | Resp 20 | Ht 72.0 in | Wt 178.0 lb

## 2016-10-26 DIAGNOSIS — R2689 Other abnormalities of gait and mobility: Secondary | ICD-10-CM | POA: Diagnosis not present

## 2016-10-26 NOTE — Patient Instructions (Addendum)
I would like for your to keep your focus on healthy eating habits, good hydration and exercising daily.

## 2016-10-26 NOTE — Progress Notes (Signed)
Subjective:    Patient ID: Timothy Mclean is a 76 y.o. male.  HPI     Interim history:   Timothy Mclean is a 76 year old right-handed gentleman with an underlying complex medical history of reflux disease, hypertension, chronic lung disease, vitamin D deficiency, vitamin B12 deficiency, thyroid disease, type 2 diabetes, heart disease, status post MI and PTCA in 1987, status post CABG in 1997, paroxysmal atrial fibrillation, now on Eliquis, gout, BPH, kidney stone, diverticulosis, shingles in 2009, who presents for follow-up consultation of his lower extremity weakness and numbness and gait d/o. He is accompanied by his wife again today.  I last saw him on 02/26/2016, at which time he was fairly stable. He was not exercising very much. He was advised to decrease his soda and tea intake and increase his water intake and exercise his lower body with a stationary bike.  Today, 10/26/2016 (all dictated new, as well as above notes, some dictation done in note pad or Word, outside of chart, may appear as copied):  He reports feeling stable. He is now on Eliquis, rather than coumadin. He has maintained Rx with Crestor. Recently, Dr Ellyn Hack took him off of amiodarone, had FU this week. Has also been off of verapamil. Per wife, he has been trying to drink more water and exercises somewhat the stationary bike, some days are better than others, also likes to walk outside when the weather permits. Thankfully, he has not had any falls.  The patient's allergies, current medications, family history, past medical history, past social history, past surgical history and problem list were reviewed and updated as appropriate.   Previously (copied from previous notes for reference):   I saw him on 08/29/2015, at which time his MMSE was 26 out of 30, clock drawing was 4 out of 4, animal fluency was 10/m. He had received a new hearing aid on the right side which helped his hearing. He had no recent falls. He was tolerating the  Crestor and had reduced his soda intake. He was encouraged to increase his water intake and use his cane at all times.     I saw him on 02/26/2015, at which time he reported doing fairly well. He had not fallen recently. He finished outpatient physical therapy on 12/18/2014. I reviewed the discharge summary at the time. He was given home exercises as well. His appetite was stable. His weight had been more stable. He has adjusted to his new dentures. His MMSE was 26/30 (missed 1 point on serial sevens, 2 points on orientation, 1 point on remote recall), CDT: 4/4, AFT: 9/min. I asked him to stay active mentally and physically, drink plenty of water, make sure he eats regularly and nutritious food and that he use a cane for safety.   I saw him on 09/27/2014, at which time he reported doing fairly well. He was treated for fungal infection of his foot by his primary care physician. His wife reported that he had some memory loss and occasional confusion. He had lost some weight. He had a checkup with Dr. Sandi Mariscal on 07/13/2014 and I reviewed his blood test results from that visit: CK was 154, vitamin B12 was 1392, methylmalonic acid was 90, CBC with differential was unremarkable, CMP showed a glucose of 114 and otherwise unremarkable, uric acid normal, CRP normal, TSH elevated at 11, triglycerides 118, LDL within normal. He was not drinking enough water and had no recent falls. He was drinking a lot of [facility]. He had recent  teeth extractions. He was maintaining well on Crestor. His exam was stable. I suggested outpatient physical therapy. I referred him to neuro rehabilitation for this. I asked to stay better hydrated.    I saw him on 05/29/14, at which time he reported taking B12 orally. He was on Crestor. He saw a spine specialist at [facility], who did a lower spine injection, which he felt helped. He was not on Lescol or lovastatin. He felt a little better with his walking as he was using a cane. He had not  fallen. He was using his stationary bike a little bit more regularly. I felt that he had a gait disorder secondary to peripheral neuropathy, degenerative lower spine disease, possibly medication side effects, his hemoglobin A1c was suboptimal in June 2015 at 7.5. He was advised to see his primary care physician for recheck on his A1c and B12 levels. He was taking his B12. I suggested we recheck his CK levels which were done on 05/29/2014. This came back as normal and we advised him of the test results in writing.     I first met him on 12/26/2013 at the request of his primary care physician, at which time the patient reported lower extremity pain, weakness and numbness associated with a history of low back pain since 2012. I suggested further workup in the form of EMG nerve conduction studies to the lower extremities, blood work.   Blood work from 12/26/2013 showed a hemoglobin A1c of 7.5, methylmalonic acid was elevated at 469, ANA was negative, CRP was normal, aldolase was borderline at 10.4, CK was 451. EMG and nerve conduction testing from 01/18/2014 showed: bilateral lumbar radiculopathies, involving bilateral L4, L5, S1 myotomes. In addition, there is evidence of chronic right cervical radiculopathy, involving right C5 nerve roots. There was also evidence of length dependent mild axonal peripheral neuropathy. I called his wife with the test results. I suggested evaluation with a spine specialist.   He was found to be intolerant to Lipitor generic and was taken off of it. He was switched to pravastatin but this was also subsequently discontinued with some subjective improvement of his symptoms. He had a mild increase in CK at 258 which subsequently returned to normal after discontinuation of his Zocor. He has known degenerative lower back disease with an MRI showing impingement at L4-5 and L5-S1 level, he has had multiple steroid epidural injections. Because of his vascular risk factors he was started on  lovastatin. His last epidural injection was in January 2015 which was not helpful. Lovastatin was discontinued in February 2015. CK level was mildly elevated at 338. His B12 supplement dose was increased recently. He had a CT head without contrast on 01/11/2013 after a fall. This showed generalized atrophy, mild chronic microvascular ischemic changes in the white matter, no acute infarct. He also had a CT cervical spine without contrast at the time which showed moderate degenerative changes, no fracture. MRI lumbar spine without contrast on 08/13/2011 showed lumbar spondylosis and degenerative disease, causing marked impingement at L4-5 and moderate impingement at L5-S1. He was started on Crestor but was initially also taking the Lovastatin 40 mg daily as well. Patient was not taking B12 injections or oral B12.   He has some tingling in the feet. He has been diabetic for about 5 years. He has no significant foot pain. He denies fasciculations and atrophy, and myoclonus. His legs gives out some and he fatigues easily. He has not had recurrent falls, but did take a fall  outside at his niece's ballgame, it was raining and the terrain was hilly and slick and rocky. 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 He volunteers at the firestation.   He quit smoking in '86 and does not drink alcohol. There is no FHx muscle or nerve disease.    His Past Medical History Is Significant For: Past Medical History:  Diagnosis Date  . Allergy   . Anxiety   . Asthma    since 2013  . Barrett's esophagus 03/09/2014  . Bilateral lumbar radiculopathy    since 2013  . CAD S/P percutaneous coronary angioplasty 1987   angioplasty and  STENT- Cx; MYOVIEW 1/'05: ? Ischemia vs. artifact --> sent for cath  . Chronic bronchitis ([place])    since 2007  . COPD (chronic obstructive pulmonary disease) ([place])   . DDD (degenerative disc disease)   . Diverticulosis   . Dyslipidemia, goal LDL below 70     Doing better off of Zocor, currently on Lescol   . ED (erectile dysfunction)   . Elevated LFTs   . Esophageal reflux   . GERD (gastroesophageal reflux disease)   . Gout, unspecified   . Hearing loss   . Helicobacter pylori gastritis 07/2009  . Herpes zoster 2009  . Hypothyroidism    since 2010  . Iron deficiency anemia, unspecified   . Other and unspecified hyperlipidemia   . Other B-complex deficiencies   . PAF (paroxysmal atrial fibrillation) ([place])    Last reported episode 2012; on amiodarone and anticoagulated on warfarin  . Personal history of colonic polyps   . Prostatitis    chronic ulcerative  . Pulmonary fibrosis ([place])    and bronchiectasis, since 2015  . Renal stone    Right  . S/P CABG x 4 1997   a) LIMA-LAD, SVG to OM, SVG-dRCA-RPL; b) FALSE + MYOVIEW --> CATH 1/'05: 100% LAD after widelly patent D1, all Cx OM branches 100%, pRCA 100%, SVG-PDA patent w/ retrograde filling of RPL, SVG-OM widely patent ~ normal OM, LIMA-LAD patent.  [name] Spinal stenosis   . ST elevation myocardial infarction (STEMI) of inferior wall, subsequent episode of care ([place]) 1987, 1997   a) PTCA of Cx; b) CABG   . Type II or unspecified type diabetes mellitus without mention of complication, not stated as uncontrolled    no meds  . Ulcerative colitis, unspecified   . Unspecified essential hypertension   . Vitamin D deficiency     His Past Surgical History Is Significant For: Past Surgical History:  Procedure Laterality Date  . BLEPHAROPLASTY Bilateral    for ptosis  . CARDIAC CATHETERIZATION  08/17/2003   "False positive stress test " grafts patent; RCA proximal 100% LAD 100% occlusion after normal D1 with 80% ostial as SP1; circumflex patent but OM1 on to all occluded; EF 50-55%  . CARDIAC CATHETERIZATION  11/1995   Preop CABG: LAD 90% at D1, circumflex 100 and OM a 90% proximal, 80% distal  . Carotid Duplex Doppler  12/23/2009   Right&Left ICAs 0-49%, mildly abnormal study,   .  CHOLECYSTECTOMY    . COLONOSCOPY    . CORONARY ARTERY BYPASS GRAFT  11/1995   LIMA-LAD, SVG to OM, SVG-dRCA-RPL  . ESOPHAGOGASTRODUODENOSCOPY    . FLEXIBLE SIGMOIDOSCOPY    . NM MYOCAR PERF EJECTION FRACTION  07/03/2003   FALSE POSITIVE TEST; Bruce protocol, negative test with scintigraphic evidence of inferoapical scar, diaphragmatic attenuation, moderate ischemia.  [name] NM MYOVIEW  LTD  2005   Questionable ischemia that is likely either infarct versus artifact; normal  . TRANSTHORACIC ECHOCARDIOGRAM  12/03/2008   LVEF =>55% normal study    His Family History Is Significant For: Family History  Problem Relation Age of Onset  . Diabetes Mother   . Congestive Heart Failure Mother   . Dementia Father   . Pneumonia Father     died of this at age 6, hx of bronchiectasis  . Heart disease Sister   . Diabetes Sister   . Lung cancer Brother     died at age 27  . Hypertension Sister   . CVA Sister   . Colon cancer Neg Hx   . Esophageal cancer Neg Hx   . Rectal cancer Neg Hx   . Stomach cancer Neg Hx     His Social History Is Significant For: Social History   Social History  . Marital status: Married    Spouse name: N/A  . Number of children: 1  . Years of education: N/A   Occupational History  . retired    Social History Main Topics  . Smoking status: Former Smoker    Packs/day: 1.00    Years: 32.00    Types: Cigarettes    Quit date: 08/27/1984  . Smokeless tobacco: Never Used  . Alcohol use No  . Drug use: No  . Sexual activity: Not Asked   Other Topics Concern  . None   Social History Narrative   He is a married father of one. Does not smoke and does not drink. He walks routinely and also does stationary bike. 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 Atorvastatin Other (See Comments)    Arthralgia, myalgia   . Fluvastatin Other (See Comments)    Arthralgia, myalgias  . Lisinopril Cough  . Metformin And Related Other (See Comments)    Anorexia, diarrhea, nausea, weight loss  . Pravastatin Other (See Comments)    Arthralgia, myalgias  . Simvastatin Other (See Comments)    myalgias  . Spiriva Handihaler [Tiotropium Bromide Monohydrate] Nausea Only  :   His Current Medications Are:  Outpatient Encounter Prescriptions as of 10/26/2016  Medication Sig  . ADVAIR DISKUS 250-50 MCG/DOSE AEPB   . albuterol (PROVENTIL HFA;VENTOLIN HFA) 108 (90 Base) MCG/ACT inhaler Inhale into the lungs every 6 (six) hours as needed for wheezing or shortness of breath.  [name] aspirin 81 MG tablet Take 81 mg by mouth daily.  . balsalazide (COLAZAL) 750 MG capsule TAKE 2 CAPSULES BY MOUTH 2 TIMES DAILY  . BAYER CONTOUR TEST test strip   . CRESTOR 20 MG tablet Take 1 tablet by mouth daily.  . cyanocobalamin 2000 MCG tablet Take 2,000 mcg by mouth daily.  [name] ELIQUIS 2.5 MG TABS tablet Take 2.5 mg by mouth daily.  [name] JANUVIA 50 MG tablet Take 50 mg by mouth daily.  [name] levothyroxine (SYNTHROID, LEVOTHROID) 75 MCG tablet Take 1 tablet by mouth daily. Take 1 tab daily  . metFORMIN (GLUCOPHAGE) 500 MG tablet   . oxybutynin (DITROPAN) 5 MG tablet Take 5 mg by mouth 1 day or 1 dose.   . pantoprazole (PROTONIX) 40 MG tablet Take 1 tablet by mouth daily.  . verapamil (CALAN-SR) 120 MG CR tablet Take 1 tablet by mouth daily if you heart rate is greater than 110 and you are  symptomatic  . vitamin D, CHOLECALCIFEROL, 400 UNITS tablet Take 400 Units by mouth daily.  . [DISCONTINUED] warfarin (COUMADIN) 5 MG tablet Take 1/2 to 1 tablet by mouth daily as directed by [facility].   No facility-administered encounter medications on file as of 10/26/2016.   :  Review of Systems:  Out of a complete 14 point review of systems, all are reviewed and negative with the exception of these symptoms as listed below:  Review of Systems   Neurological:       Pt presents today to follow up on his gait disorder. Pt denies any recent falls and uses his cane.    Objective:  Neurologic Exam  Physical Exam Physical Examination:   Vitals:   10/26/16 1357  BP: (!) 115/58  Pulse: (!) 59  Resp: 20   General Examination: The patient is a very pleasant 76 y.o. male in no acute distress. He appears well-developed and well-nourished and well groomed. He has lost a little more weight.   HEENT: Normocephalic, atraumatic, pupils are equal, round and reactive to light and accommodation. Corrective eye glasses. R sided hearing aid. Extraocular tracking is good without limitation to gaze excursion or nystagmus noted. Hearing is mildly impaired. There is mild hypophonia. There is no tremor in the head and neck area or face. Airway examination reveals mild mouth dryness, otherwise unchanged findings.  Chest: Clear to auscultation without wheezing, rhonchi or crackles noted.  Heart: S1+S2+0, regular and normal without murmurs, rubs or gallops noted.   Abdomen: Soft, non-tender and non-distended with normal bowel sounds appreciated.   Extremities: There is no pitting edema in the distal lower extremities bilaterally. Pedal pulses are intact.  Skin: Warm and dry without trophic changes noted.  Musculoskeletal: exam reveals no obvious joint deformities, tenderness or joint swelling or erythema.   Neurologically:  Mental status: The patient is awake, alert and oriented in all 4 spheres. His immediate and remote memory, attention, language skills and fund of knowledge are fairly appropriate. Mood is normal and affect is normal.   On 02/26/2015: MMSE: 26/30, he missed 1 point on serial sevens, and 2 points on orientation as well as one point on remote recall. CDT: 4/4, AFT: 9/min.  On 08/29/2015: MMSE: 26/30, CDT: 4/4, AFT: 10/min.   On 02/26/2016: MMSE: 27/30, CDT: 4/4, AFT: 9/min.  Cranial nerves II - XII are as described above under  HEENT exam. In addition: shoulder shrug is normal with equal shoulder height noted. Motor exam: Normal bulk, strength and tone is noted in the upper extremities, he has mild weakness in the proximal lower extremities bilaterally, stable. Romberg is not testable safely. Reflexes are 1+ with the exception of absent ankle jerks. Fine motor skills are mildly impaired globally.  Sensory exam: intact to light touch decreased sensation is noted in the distal lower extremities bilaterally, stable.  Gait, station and balance: He stands up with mild difficulty and pushes himself up. He needs no assistance. He stands wide-based and slightly bowlegged, not pushing through completely with his knees, all stable. He walks slightly insecurely and wide-based, he uses his cane, tandem walk is not possible for him.  Assessment and Plan:    In summary, Timothy Mclean is a very pleasant 76 y.o.-year old male  with an underlying complex medical history of reflux disease, hypertension, chronic lung disease, vitamin D deficiency, vitamin B12 deficiency, thyroid disease, type 2 diabetes, heart disease, status post MI and PTCA in 1987, status post CABG in 1997, paroxysmal atrial  fibrillation on Eliquis, gout, BPH, kidney stone, diverticulosis, shingles in 2009, who presents for follow-up consultation of his lower extremity weakness and numbness and gait d/o. He has remained stable. He is no longer on Coumadin, no longer on verapamil and also recently was advised to stop his amiodarone per cardiology. He is in close follow-up with them. He has done fairly well from my end of things and has remained stable. I suggested a one-year checkup routinely, sooner as needed. We talked about gait safety and fall risk again today. He is advised to use his cane at all times, stay well-hydrated and well-nourished with a balanced diet and try to exercise on a regular basis.  I answered all their questions today and the patient and his wife were in  agreement.   I spent 20 minutes in total face-to-face time with the patient, more than 50% of which was spent in counseling and coordination of care, reviewing test results, reviewing medication and discussing or reviewing the diagnosis of gait d/o, its prognosis and treatment options. Pertinent laboratory and imaging test results that were available during this visit with the patient were reviewed by me and considered in my medical decision making (see chart for details).

## 2016-10-29 ENCOUNTER — Ambulatory Visit (INDEPENDENT_AMBULATORY_CARE_PROVIDER_SITE_OTHER): Payer: PPO | Admitting: Nurse Practitioner

## 2016-10-29 ENCOUNTER — Encounter: Payer: Self-pay | Admitting: Nurse Practitioner

## 2016-10-29 VITALS — BP 102/68 | HR 85 | Ht 72.0 in | Wt 179.8 lb

## 2016-10-29 DIAGNOSIS — I10 Essential (primary) hypertension: Secondary | ICD-10-CM | POA: Diagnosis not present

## 2016-10-29 DIAGNOSIS — I481 Persistent atrial fibrillation: Secondary | ICD-10-CM | POA: Diagnosis not present

## 2016-10-29 DIAGNOSIS — E785 Hyperlipidemia, unspecified: Secondary | ICD-10-CM | POA: Diagnosis not present

## 2016-10-29 DIAGNOSIS — I4819 Other persistent atrial fibrillation: Secondary | ICD-10-CM

## 2016-10-29 DIAGNOSIS — I2510 Atherosclerotic heart disease of native coronary artery without angina pectoris: Secondary | ICD-10-CM

## 2016-10-29 LAB — BASIC METABOLIC PANEL
BUN: 25 mg/dL (ref 7–25)
CHLORIDE: 102 mmol/L (ref 98–110)
CO2: 20 mmol/L (ref 20–31)
Calcium: 9.7 mg/dL (ref 8.6–10.3)
Creat: 1.86 mg/dL — ABNORMAL HIGH (ref 0.70–1.18)
GLUCOSE: 103 mg/dL — AB (ref 65–99)
POTASSIUM: 5 mmol/L (ref 3.5–5.3)
SODIUM: 137 mmol/L (ref 135–146)

## 2016-10-29 LAB — CBC
HEMATOCRIT: 44.5 % (ref 38.5–50.0)
HEMOGLOBIN: 14.6 g/dL (ref 13.2–17.1)
MCH: 28.8 pg (ref 27.0–33.0)
MCHC: 32.8 g/dL (ref 32.0–36.0)
MCV: 87.8 fL (ref 80.0–100.0)
MPV: 10.3 fL (ref 7.5–12.5)
Platelets: 187 10*3/uL (ref 140–400)
RBC: 5.07 MIL/uL (ref 4.20–5.80)
RDW: 15 % (ref 11.0–15.0)
WBC: 7.4 10*3/uL (ref 3.8–10.8)

## 2016-10-29 MED ORDER — APIXABAN 5 MG PO TABS: 5.0000 mg | ORAL_TABLET | Freq: Two times a day (BID) | ORAL | 3 refills | Status: DC

## 2016-10-29 NOTE — Patient Instructions (Signed)
Medication Instructions:  INCREASE Eliquis to 5mg  two times daily.  Labwork: Please have lab work TODAY (CBC, BMET).  Testing/Procedures: NONE  Follow-Up: Your physician wants you to follow-up in: 6 MONTHS with Dr. Ellyn Hack.  You will receive a reminder letter in the mail two months in advance. If you don't receive a letter, please call our office to schedule the follow-up appointment.   Any Other Special Instructions Will Be Listed Below (If Applicable).     If you need a refill on your cardiac medications before your next appointment, please call your pharmacy.

## 2016-10-29 NOTE — Progress Notes (Signed)
Office Visit    Patient Name: Timothy Mclean Date of Encounter: 10/29/2016  Primary Care Provider:  Marylene Land, MD Primary Cardiologist:  Roni Bread, MD   Chief Complaint    76 year old male with a history of coronary artery disease status post CABG, now persistent atrial fibrillation, COPD, hyperlipidemia, hypertension, and diabetes, who presents for follow-up.  Past Medical History    Past Medical History:  Diagnosis Date  . Allergy   . Anxiety   . Asthma    since 2013  . Barrett's esophagus 03/09/2014  . Bilateral lumbar radiculopathy    since 2013  . CAD S/P percutaneous coronary angioplasty 1987   a. 1987 angioplasty and BMS- Cx; b. 1997 CABG x 4;  c. MYOVIEW 1/'05: ? Ischemia vs. artifact --> sent for cath - patent grafts.  . Chronic atrial fibrillation ([place])    a. Prev on amiodarone-->d/c 08/2016 in setting of recurrent AF, LFT abnormalities, and pulmonary symptoms;  b. CHA2DS2VASc = 4-->eliquis.  . Chronic bronchitis ([place])    since 2007  . COPD (chronic obstructive pulmonary disease) ([place])   . DDD (degenerative disc disease)   . Diverticulosis   . Dyslipidemia, goal LDL below 70    Doing better off of Zocor, currently on Lescol   . ED (erectile dysfunction)   . Elevated LFTs   . Esophageal reflux   . GERD (gastroesophageal reflux disease)   . Gout, unspecified   . Hearing loss   . Helicobacter pylori gastritis 07/2009  . Herpes zoster 2009  . Hypothyroidism    since 2010  . Iron deficiency anemia, unspecified   . Leg weakness    a. 09/2016 ABI's: R 1.05, L 0.98.  [name] Other and unspecified hyperlipidemia   . Other B-complex deficiencies   . Personal history of colonic polyps   . Prostatitis    chronic ulcerative  . Pulmonary fibrosis ([place])    and bronchiectasis, since 2015  . Renal stone    Right  . S/P CABG x 4 1997   a) LIMA-LAD, SVG to OM, SVG-dRCA-RPL; b) FALSE + MYOVIEW --> CATH 1/'05: 100% LAD after widelly patent D1, all Cx OM branches  100%, pRCA 100%, SVG-PDA patent w/ retrograde filling of RPL, SVG-OM widely patent ~ normal OM, LIMA-LAD patent.  [name] Spinal stenosis   . ST elevation myocardial infarction (STEMI) of inferior wall, subsequent episode of care ([place]) 1987, 1997   a) PTCA of Cx; b) CABG   . Type II or unspecified type diabetes mellitus without mention of complication, not stated as uncontrolled    no meds  . Ulcerative colitis, unspecified   . Unspecified essential hypertension   . Vitamin D deficiency    Past Surgical History:  Procedure Laterality Date  . BLEPHAROPLASTY Bilateral    for ptosis  . CARDIAC CATHETERIZATION  08/17/2003   "False positive stress test " grafts patent; RCA proximal 100% LAD 100% occlusion after normal D1 with 80% ostial as SP1; circumflex patent but OM1 on to all occluded; EF 50-55%  . CARDIAC CATHETERIZATION  11/1995   Preop CABG: LAD 90% at D1, circumflex 100 and OM a 90% proximal, 80% distal  . Carotid Duplex Doppler  12/23/2009   Right&Left ICAs 0-49%, mildly abnormal study,   . CHOLECYSTECTOMY    . COLONOSCOPY    . CORONARY ARTERY BYPASS GRAFT  11/1995   LIMA-LAD, SVG to OM, SVG-dRCA-RPL  . ESOPHAGOGASTRODUODENOSCOPY    . FLEXIBLE SIGMOIDOSCOPY    . NM [facility]  PERF EJECTION FRACTION  07/03/2003   FALSE POSITIVE TEST; Bruce protocol, negative test with scintigraphic evidence of inferoapical scar, diaphragmatic attenuation, moderate ischemia.  [name] NM MYOVIEW LTD  2005   Questionable ischemia that is likely either infarct versus artifact; normal  . TRANSTHORACIC ECHOCARDIOGRAM  12/03/2008   LVEF =>55% normal study    Allergies  Allergies  Allergen Reactions  . Atorvastatin Other (See Comments)    Arthralgia, myalgia  . Fluvastatin Other (See Comments)    Arthralgia, myalgias  . Lisinopril Cough  . Metformin And Related Other (See Comments)    Anorexia, diarrhea, nausea, weight loss  . Pravastatin Other (See Comments)    Arthralgia, myalgias  . Simvastatin Other (See  Comments)    myalgias  . Spiriva Handihaler [Tiotropium Bromide Monohydrate] Nausea Only    History of Present Illness    76 year old male with the above complex past medical history. He is status post PTCA and stenting of the left circumflex in 1987 with subsequent coronary artery bypass grafting 4 in 1997. He had a false positive Myoview in January 2005 with catheterization revealing for a 4 patent grafts. Other history includes hyperlipidemia, paroxysmal-now persistent atrial fibrillation, diabetes, COPD, pulmonary fibrosis, hypothyroidism, GERD, Barrett's esophagus, and hypertension. He is evaluated earlier this year for concern of pancreatic head mass, which was noted on CT in January. Follow-up MRI of the abdomen in February did not show a pancreatic head mass but did suggest variable fatty infiltration and a small interior distal duodenal diverticulum with a low risk 1 cm pancreatic lesion.  He was last seen in clinic in February, at which time he reported some leg weakness. ABIs were performed and were normal. He was also noted to be back in atrial fibrillation at that time and was well rate controlled. He had been on amiodarone, and decision was made to discontinue this at the last visit in the setting of recurrent A. fib, history of COPD and pulmonary fibrosis, and recent LFT abnormalities. Since his last visit he has done quite well. He is riding an exercise bike 3-4 times per week for about 20 minutes at a time without chest pain or dyspnea. He is also walking some and feels as though his leg strength has improved. He checks his blood pressure regularly. He has had an occasional drop of blood pressure into the high 80s or low 90s though he has not been symptomatic. For the most part, his blood pressure has been running in the low 100s. He has not currently on any antihypertensives.   His primary care provider switched him from warfarin to eliquis. His medication list is 2.5 mg daily, however  after discussion with his wife, she says she is certain he is taking it twice a day. Of note, he is less than 66 years old and greater than 60 kg thus he should be on 5 mg daily. The last creatinine we have on file was 1.34 in 2014. He denies palpitations, PND, orthopnea, dizziness, syncope, edema, or early satiety.  Home Medications    Prior to Admission medications   Medication Sig Start Date End Date Taking? Authorizing Provider  ADVAIR DISKUS 250-50 MCG/DOSE AEPB  08/16/16   Historical Provider, MD  albuterol (PROVENTIL HFA;VENTOLIN HFA) 108 (90 Base) MCG/ACT inhaler Inhale into the lungs every 6 (six) hours as needed for wheezing or shortness of breath.    Historical Provider, MD  aspirin 81 MG tablet Take 81 mg by mouth daily.    Historical Provider, MD  balsalazide (COLAZAL) 750 MG capsule TAKE 2 CAPSULES BY MOUTH 2 TIMES DAILY 08/19/16   Gatha Mayer, MD  BAYER CONTOUR TEST test strip  05/07/14   Historical Provider, MD  CRESTOR 20 MG tablet Take 1 tablet by mouth daily. 07/23/14   Historical Provider, MD  cyanocobalamin 2000 MCG tablet Take 2,000 mcg by mouth daily.    Historical Provider, MD  ELIQUIS 2.5 MG TABS tablet Take 2.5 mg by mouth daily. 10/05/16   Historical Provider, MD  JANUVIA 50 MG tablet Take 50 mg by mouth daily. 10/05/16   Historical Provider, MD  levothyroxine (SYNTHROID, LEVOTHROID) 75 MCG tablet Take 1 tablet by mouth daily. Take 1 tab daily 01/08/15   Historical Provider, MD  metFORMIN (GLUCOPHAGE) 500 MG tablet  02/06/16   Historical Provider, MD  oxybutynin (DITROPAN) 5 MG tablet Take 5 mg by mouth 1 day or 1 dose.  10/02/11   Historical Provider, MD  pantoprazole (PROTONIX) 40 MG tablet Take 1 tablet by mouth daily. 09/07/14   Historical Provider, MD  verapamil (CALAN-SR) 120 MG CR tablet Take 1 tablet by mouth daily if you heart rate is greater than 110 and you are symptomatic    Historical Provider, MD  vitamin D, CHOLECALCIFEROL, 400 UNITS tablet Take 400 Units by  mouth daily.    Historical Provider, MD    Review of Systems    He denies chest pain, palpitations, dyspnea, pnd, orthopnea, n, v, dizziness, syncope, edema, weight gain, or early satiety. Leg strength seems to be improving.  All other systems reviewed and are otherwise negative except as noted above.  Physical Exam    VS:  BP 102/68   Pulse 85   Ht 6' (1.829 m)   Wt 179 lb 12.8 oz (81.6 kg)   BMI 24.39 kg/m  , BMI Body mass index is 24.39 kg/m. GEN: Well nourished, well developed, in no acute distress.  HEENT: normal.  Neck: Supple, no JVD, carotid bruits, or masses. Cardiac: Irregularly irregular, no murmurs, rubs, or gallops. No clubbing, cyanosis, edema.  Radials/DP/PT 2+ and equal bilaterally.  Respiratory:  Respirations regular and unlabored, clear to auscultation bilaterally. GI: Soft, nontender, nondistended, BS + x 4. MS: no deformity or atrophy. Skin: warm and dry, no rash. Neuro:  Strength and sensation are intact. Psych: Normal affect.  Accessory Clinical Findings    ECG - Atrial fibrillation, 85, nonspecific ST and T changes.  Assessment & Plan    1.  Persistent Atrial Fibrillation:At last clinic visit, patient was noted to be back in atrial fibrillation. He was not particularly symptomatic. At the time, he was on amiodarone, however this was discontinued secondary to recurrent of atrial fibrillation, history of COPD and pulmonary fibrosis, and recent LFT abnormalities. Despite remaining off of amiodarone, his rate is well controlled at 85 bpm today. He does remain in atrial fibrillation and is largely asymptomatic. He had previously been on warfarin, however this was discontinued by his primary care provider and he is now on eliquis 2.5 mg twice a day. As noted above, at 179 pounds and at an age less than 51, he should be on 5 mg twice a day. I had this discussion with he and his wife today. I will follow-up a CBC and basic metabolic panel today and we will send in a  prescription for 5 mg twice a day.  2. Essential hypertension with recent hypotension: Patient has been following his blood pressures regularly. Pressures typically are trending in the low 100s  to 1 teens. He occasionally drops into the high 80s or 90s, though has not been particularly symptomatic. He is currently on no antihypertensives. No therapy is required at this time. I advised that he may liberalize salt in his diet.  3. Hyperlipidemia: Though he has multiple statin intolerances, he does tolerate Crestor. This is followed by primary care.  4. Coronary artery disease status post CABG: He has been doing well without chest pain or dyspnea. He is riding his exercise bike 3-4 times per week and also walking some. He remains on low-dose aspirin and statin therapy.  5. Type 2 diabetes mellitus: He is on metformin therapy and this is followed by primary care.  6. Disposition: Follow-up CBC and basic metabolic panel today. Follow-up with Dr. Ellyn Hack in 6 months or sooner if necessary.  Gries Hodgkins, NP 10/29/2016, 10:35 AM

## 2016-11-03 DIAGNOSIS — N183 Chronic kidney disease, stage 3 (moderate): Secondary | ICD-10-CM | POA: Diagnosis not present

## 2016-11-09 ENCOUNTER — Telehealth: Payer: Self-pay | Admitting: *Deleted

## 2016-11-09 NOTE — Telephone Encounter (Signed)
Patient made aware Eliquis card and PA forms at front desk for pick up.

## 2016-11-24 ENCOUNTER — Encounter: Payer: Self-pay | Admitting: Podiatry

## 2016-11-24 ENCOUNTER — Ambulatory Visit (INDEPENDENT_AMBULATORY_CARE_PROVIDER_SITE_OTHER): Payer: PPO | Admitting: Podiatry

## 2016-11-24 DIAGNOSIS — E1151 Type 2 diabetes mellitus with diabetic peripheral angiopathy without gangrene: Secondary | ICD-10-CM | POA: Diagnosis not present

## 2016-11-24 DIAGNOSIS — M79675 Pain in left toe(s): Secondary | ICD-10-CM

## 2016-11-24 DIAGNOSIS — B351 Tinea unguium: Secondary | ICD-10-CM | POA: Diagnosis not present

## 2016-11-24 DIAGNOSIS — M79674 Pain in right toe(s): Secondary | ICD-10-CM | POA: Diagnosis not present

## 2016-11-24 NOTE — Patient Instructions (Signed)

## 2016-11-24 NOTE — Progress Notes (Signed)
Patient ID: Timothy Mclean, male   DOB: [birth year], 76 y.o.   MRN: [id number]    Subjective: This patient presents today for scheduled visit complaining of uncomfortable toenails walking wearing shoes and requests toenail debridement.  Patient is type II diabetic denies any history of foot ulceration, amputation claudication His wife is present in the treatment room today  Objective: Patient appears orientated 3  Vascular: No calf tenderness or calf edema bilaterally DP pulses 2/4 bilaterally PT pulses trace palpable bilaterally Capillary reflex immediate bilaterally  Neurological: Sensation to 10 g monofilament wire intact 5/5 bilaterally Vibratory sensation nonreactive bilaterally Ankle reflexes equal and reactive bilaterally  Dermatological: No open skin lesions bilaterally Atrophic skin with absent hair growth bilaterally Skin texture and turgor appears within normal limits The toenails are hypertrophic, elongated, discolored and tender to direct palpation 6-10  Musculoskeletal: Hammertoes 1-5 bilaterally Unsteady gait pattern Ino restriction ankle, subtalar, midtarsal joints bilaterally  Assessment: Type II diabetic Decreased posterior tibial pulses bilaterally Diabetic peripheral arterial disease Decreased vibratory sensation Diabetic peripheral neuropathy  Plan: The toenails 6-10 were debrided mechanically and let without any bleeding  Reappoint 3 months

## 2016-12-07 DIAGNOSIS — L905 Scar conditions and fibrosis of skin: Secondary | ICD-10-CM | POA: Diagnosis not present

## 2016-12-07 DIAGNOSIS — Z85828 Personal history of other malignant neoplasm of skin: Secondary | ICD-10-CM | POA: Diagnosis not present

## 2016-12-07 DIAGNOSIS — L570 Actinic keratosis: Secondary | ICD-10-CM | POA: Diagnosis not present

## 2017-01-04 DIAGNOSIS — E119 Type 2 diabetes mellitus without complications: Secondary | ICD-10-CM | POA: Diagnosis not present

## 2017-01-04 DIAGNOSIS — N183 Chronic kidney disease, stage 3 (moderate): Secondary | ICD-10-CM | POA: Diagnosis not present

## 2017-01-06 DIAGNOSIS — R634 Abnormal weight loss: Secondary | ICD-10-CM | POA: Diagnosis not present

## 2017-01-06 DIAGNOSIS — N183 Chronic kidney disease, stage 3 (moderate): Secondary | ICD-10-CM | POA: Diagnosis not present

## 2017-01-06 DIAGNOSIS — E119 Type 2 diabetes mellitus without complications: Secondary | ICD-10-CM | POA: Diagnosis not present

## 2017-02-01 ENCOUNTER — Other Ambulatory Visit: Payer: Self-pay | Admitting: *Deleted

## 2017-02-01 ENCOUNTER — Telehealth: Payer: Self-pay | Admitting: Cardiology

## 2017-02-01 MED ORDER — APIXABAN 5 MG PO TABS: 5.0000 mg | ORAL_TABLET | Freq: Two times a day (BID) | ORAL | 3 refills | Status: DC

## 2017-02-01 NOTE — Telephone Encounter (Signed)
She wants to know if you have heard anything from him getting help with his Eliquis?

## 2017-02-01 NOTE — Telephone Encounter (Signed)
Call returned to the wife, per DPR. She has been informed that Dr. Allison Quarry nurse has the form and it is in process. Samples have been provided and are at the front desk. She verbalized her understanding.   Medication Samples have been provided to the patient.  Drug name: Eliquis        Strength: 5 mg         Qty: 2 boxes   LOT: [id number]   Exp.Date: 10/20

## 2017-02-15 ENCOUNTER — Telehealth: Payer: Self-pay | Admitting: Cardiology

## 2017-02-15 NOTE — Telephone Encounter (Signed)
Medication samples have been provided to the patient.  Drug name: eliquis 65m  Qty: 2 boxes  LOT: [id number] Exp.Date: 06/2019  Samples left at front desk for patient pick-up. Patient notified.  ESheral ApleyM 3:06 PM 02/15/2017

## 2017-02-15 NOTE — Telephone Encounter (Signed)
New Message     Patient calling the office for samples of medication:   1.  What medication and dosage are you requesting samples for? 51m Eliquis   2.  Are you currently out of this medication?  Has a couple left, she was told she can get samples every 2 weeks

## 2017-02-17 ENCOUNTER — Ambulatory Visit: Payer: Self-pay | Admitting: Pharmacist Clinician (PhC)/ Clinical Pharmacy Specialist

## 2017-02-17 DIAGNOSIS — Z7901 Long term (current) use of anticoagulants: Secondary | ICD-10-CM

## 2017-02-24 ENCOUNTER — Ambulatory Visit (INDEPENDENT_AMBULATORY_CARE_PROVIDER_SITE_OTHER): Payer: PPO | Admitting: Podiatry

## 2017-02-24 ENCOUNTER — Encounter: Payer: Self-pay | Admitting: Podiatry

## 2017-02-24 DIAGNOSIS — E1142 Type 2 diabetes mellitus with diabetic polyneuropathy: Secondary | ICD-10-CM

## 2017-02-24 DIAGNOSIS — E1151 Type 2 diabetes mellitus with diabetic peripheral angiopathy without gangrene: Secondary | ICD-10-CM

## 2017-02-24 DIAGNOSIS — B351 Tinea unguium: Secondary | ICD-10-CM | POA: Diagnosis not present

## 2017-02-24 NOTE — Progress Notes (Signed)
Patient ID: FILBERT CRAZE, male   DOB: [birth year], 76 y.o.   MRN: [id number]    Subjective: This patient presents today for scheduled visit complaining of uncomfortable toenails walking wearing shoes and requests toenail debridement.  Patient is type II diabetic denies any history of foot ulceration, amputation claudication His wife is present in the treatment room today  Objective: Patient appears orientated 3  Vascular: No calf tenderness or calf edema bilaterally DP pulses 2/4 bilaterally PT pulses trace palpable bilaterally Capillary reflex immediate bilaterally  Neurological: Sensation to 10 g monofilament wire intact 5/5 bilaterally Vibratory sensation nonreactive bilaterally Ankle reflexes equal and reactive bilaterally  Dermatological: No open skin lesions bilaterally Atrophic skin with absent hair growth bilaterally Skin texture and turgor appears within normal limits The toenails are hypertrophic, elongated, discolored and tender to direct palpation 6-10  Musculoskeletal: Hammertoes 1-5 bilaterally Unsteady gait pattern no restriction ankle, subtalar, midtarsal joints bilaterally Manual motor testing dorsi flexion, plantar flexion 5/5 bilaterally  Assessment: Type II diabetic Diabetic peripheral arterial disease Diabetic peripheral neuropathy Mycotic toenails 6-10  Plan: The toenails 6-10 were debrided mechanically and let without any bleeding  Reappoint 3 months

## 2017-02-24 NOTE — Patient Instructions (Signed)

## 2017-03-03 DIAGNOSIS — S51812A Laceration without foreign body of left forearm, initial encounter: Secondary | ICD-10-CM | POA: Diagnosis not present

## 2017-03-04 DIAGNOSIS — H25813 Combined forms of age-related cataract, bilateral: Secondary | ICD-10-CM | POA: Diagnosis not present

## 2017-03-04 DIAGNOSIS — E119 Type 2 diabetes mellitus without complications: Secondary | ICD-10-CM | POA: Diagnosis not present

## 2017-03-04 DIAGNOSIS — H04123 Dry eye syndrome of bilateral lacrimal glands: Secondary | ICD-10-CM | POA: Diagnosis not present

## 2017-03-04 DIAGNOSIS — H1849 Other corneal degeneration: Secondary | ICD-10-CM | POA: Diagnosis not present

## 2017-04-05 DIAGNOSIS — E119 Type 2 diabetes mellitus without complications: Secondary | ICD-10-CM | POA: Diagnosis not present

## 2017-04-12 DIAGNOSIS — E119 Type 2 diabetes mellitus without complications: Secondary | ICD-10-CM | POA: Diagnosis not present

## 2017-04-12 DIAGNOSIS — I959 Hypotension, unspecified: Secondary | ICD-10-CM | POA: Diagnosis not present

## 2017-04-12 DIAGNOSIS — N401 Enlarged prostate with lower urinary tract symptoms: Secondary | ICD-10-CM | POA: Diagnosis not present

## 2017-04-12 DIAGNOSIS — R634 Abnormal weight loss: Secondary | ICD-10-CM | POA: Diagnosis not present

## 2017-04-23 DIAGNOSIS — Z23 Encounter for immunization: Secondary | ICD-10-CM | POA: Diagnosis not present

## 2017-04-29 ENCOUNTER — Encounter: Payer: Self-pay | Admitting: Cardiology

## 2017-04-29 ENCOUNTER — Ambulatory Visit (INDEPENDENT_AMBULATORY_CARE_PROVIDER_SITE_OTHER): Payer: PPO | Admitting: Cardiology

## 2017-04-29 VITALS — BP 112/72 | HR 84 | Ht 72.0 in | Wt 171.2 lb

## 2017-04-29 DIAGNOSIS — Z7901 Long term (current) use of anticoagulants: Secondary | ICD-10-CM

## 2017-04-29 DIAGNOSIS — I10 Essential (primary) hypertension: Secondary | ICD-10-CM | POA: Diagnosis not present

## 2017-04-29 DIAGNOSIS — I482 Chronic atrial fibrillation, unspecified: Secondary | ICD-10-CM

## 2017-04-29 DIAGNOSIS — I2510 Atherosclerotic heart disease of native coronary artery without angina pectoris: Secondary | ICD-10-CM

## 2017-04-29 DIAGNOSIS — I2119 ST elevation (STEMI) myocardial infarction involving other coronary artery of inferior wall: Secondary | ICD-10-CM | POA: Diagnosis not present

## 2017-04-29 DIAGNOSIS — E785 Hyperlipidemia, unspecified: Secondary | ICD-10-CM | POA: Diagnosis not present

## 2017-04-29 DIAGNOSIS — I952 Hypotension due to drugs: Secondary | ICD-10-CM

## 2017-04-29 MED ORDER — APIXABAN 5 MG PO TABS: 5.0000 mg | ORAL_TABLET | Freq: Two times a day (BID) | ORAL | 0 refills | Status: DC

## 2017-04-29 MED ORDER — APIXABAN 5 MG PO TABS: 5.0000 mg | ORAL_TABLET | Freq: Two times a day (BID) | ORAL | 3 refills | Status: DC

## 2017-04-29 NOTE — Progress Notes (Signed)
PCP: Derinda Late, MD  Clinic Note: Chief Complaint  Patient presents with  . Follow-up    HPI: Timothy Mclean is a 76 y.o. male with a PMH below who presents today for 6 Month follow-up for CAD and persistent atrial fibrillation. He has a history of CABGx4 in 1997 (after PTCA-stent of LCx in1987)with false positive Myoview in January 2005. No further testing since then. He is on amiodarone and warfarin for his A. Fib. He obviously also has hypertension hyperlipidemia and diabetes. Complicated by COPD.  GIOVANNE NICKOLSON was last seen on 10/29/2016 by Ignacia Bayley, NP. This was in response to a CT scan and MRI that showed possible pancreatic head mass. MRI showed that this is actually more fatty infiltration and not pancreatic cancer. I had anesthesia was back in A. fib in February 2018. We then this guided to stop amiodarone because of concerns for pulmonary fibrosis including LFTs. He was doing a stationary bike 3-4 times a week. It looks as though he may have switched from warfarin to [place].  Recent Hospitalizations: n/a  Studies Personally Reviewed - (if available, images/films reviewed: From Epic Chart or Care Everywhere)  n/a  Interval History:  Presents today overall doing very well. He is not had any major issues since I last saw him. He acknowledges not being very active, but with what he does he feels fine. He has no resting or exertional chest tightness or pressure. No dyspnea with rest or exertion unless he pushes it. He tries to do his stationary bike as much as he can, but not as much as he would like. He denies any PND, orthopnea with minimal edema. No lightheadedness or dizziness, wooziness or syncope/near-syncope. No amaurosis fugax or TIA symptoms. No rapid irregular heartbeats palpitations. He has no sensation whatsoever being in her out of atrial fibrillation despite being off amiodarone.  He denies any melena, hematochezia, hematuria or epistaxis, does note  extensive bruising and skin tears are very difficult to heal. He denies any claudication. Uses a cane to walk.  After adjusting his diabetes regimen with a reduced dose metformin, he is now starting to eat better and has finally stabilized his weight at what is right now.  ROS: A comprehensive was performed. Review of Systems  Constitutional: Negative for malaise/fatigue.  HENT: Negative for congestion.   Respiratory: Negative for shortness of breath.   Gastrointestinal: Positive for constipation.  Musculoskeletal: Positive for joint pain. Negative for falls and myalgias.  Neurological: Positive for dizziness (just poor balance). Negative for weakness.  Psychiatric/Behavioral: Negative for depression and memory loss. The patient does not have insomnia.   All other systems reviewed and are negative.    I have reviewed and (if needed) personally updated the patient's problem list, medications, allergies, past medical and surgical history, social and family history.   Past Medical History:  Diagnosis Date  . Allergy   . Anxiety   . Asthma    since 2013  . Barrett's esophagus 03/09/2014  . Bilateral lumbar radiculopathy    since 2013  . CAD S/P percutaneous coronary angioplasty 1987   a. 1987 angioplasty and BMS- Cx; b. 1997 CABG x 4;  c. MYOVIEW 1/'05: ? Ischemia vs. artifact --> sent for cath - patent grafts.  . Chronic atrial fibrillation ([place])    a. Prev on amiodarone-->d/c 08/2016 in setting of recurrent AF, LFT abnormalities, and pulmonary symptoms;  b. CHA2DS2VASc = 4-->eliquis.  . Chronic bronchitis ([place])    since 2007  .  COPD (chronic obstructive pulmonary disease) ([place])   . DDD (degenerative disc disease)   . Diverticulosis   . Dyslipidemia, goal LDL below 70    Doing better off of Zocor, currently on Lescol   . ED (erectile dysfunction)   . Elevated LFTs   . Esophageal reflux   . GERD (gastroesophageal reflux disease)   . Gout, unspecified   . Hearing loss   .  Helicobacter pylori gastritis 07/2009  . Herpes zoster 2009  . Hypothyroidism    since 2010  . Iron deficiency anemia, unspecified   . Leg weakness    a. 09/2016 ABI's: R 1.05, L 0.98.  [name] Other and unspecified hyperlipidemia   . Other B-complex deficiencies   . Personal history of colonic polyps   . Prostatitis    chronic ulcerative  . Pulmonary fibrosis ([place])    and bronchiectasis, since 2015  . Renal stone    Right  . S/P CABG x 4 1997   a) LIMA-LAD, SVG to OM, SVG-dRCA-RPL; b) FALSE + MYOVIEW --> CATH 1/'05: 100% LAD after widelly patent D1, all Cx OM branches 100%, pRCA 100%, SVG-PDA patent w/ retrograde filling of RPL, SVG-OM widely patent ~ normal OM, LIMA-LAD patent.  [name] Spinal stenosis   . ST elevation myocardial infarction (STEMI) of inferior wall, subsequent episode of care ([place]) 1987, 1997   a) PTCA of Cx; b) CABG   . Type II or unspecified type diabetes mellitus without mention of complication, not stated as uncontrolled    no meds  . Ulcerative colitis, unspecified   . Unspecified essential hypertension   . Vitamin D deficiency     Past Surgical History:  Procedure Laterality Date  . BLEPHAROPLASTY Bilateral    for ptosis  . CARDIAC CATHETERIZATION  08/17/2003   "False positive stress test " grafts patent; RCA proximal 100% LAD 100% occlusion after normal D1 with 80% ostial as SP1; circumflex patent but OM1 on to all occluded; EF 50-55%  . CARDIAC CATHETERIZATION  11/1995   Preop CABG: LAD 90% at D1, circumflex 100 and OM a 90% proximal, 80% distal  . Carotid Duplex Doppler  12/23/2009   Right&Left ICAs 0-49%, mildly abnormal study,   . CHOLECYSTECTOMY    . COLONOSCOPY    . CORONARY ARTERY BYPASS GRAFT  11/1995   LIMA-LAD, SVG to OM, SVG-dRCA-RPL  . ESOPHAGOGASTRODUODENOSCOPY    . FLEXIBLE SIGMOIDOSCOPY    . NM MYOCAR PERF EJECTION FRACTION  07/03/2003   FALSE POSITIVE TEST; Bruce protocol, negative test with scintigraphic evidence of inferoapical scar,  diaphragmatic attenuation, moderate ischemia.  [name] NM MYOVIEW LTD  2005   Questionable ischemia that is likely either infarct versus artifact; normal  . TRANSTHORACIC ECHOCARDIOGRAM  12/03/2008   LVEF =>55% normal study    Current Meds  Medication Sig  . ADVAIR DISKUS 250-50 MCG/DOSE AEPB Inhale 1 puff into the lungs 2 (two) times daily.   [name] albuterol (PROVENTIL HFA;VENTOLIN HFA) 108 (90 Base) MCG/ACT inhaler Inhale into the lungs every 6 (six) hours as needed for wheezing or shortness of breath.  [name] apixaban (ELIQUIS) 5 MG TABS tablet Take 1 tablet (5 mg total) by mouth 2 (two) times daily.  . balsalazide (COLAZAL) 750 MG capsule TAKE 2 CAPSULES BY MOUTH 2 TIMES DAILY  . BAYER CONTOUR TEST test strip   . CRESTOR 20 MG tablet Take 1 tablet by mouth daily.  . cyanocobalamin 2000 MCG tablet Take 2,000 mcg by mouth daily.  . finasteride (PROSCAR) 5 MG  tablet Take 1 tablet by mouth daily.  [name] JANUVIA 50 MG tablet Take 50 mg by mouth daily.  [name] levothyroxine (SYNTHROID, LEVOTHROID) 75 MCG tablet Take 1 tablet by mouth daily. Take 1 tab daily  . metFORMIN (GLUCOPHAGE) 500 MG tablet Take 500 mg by mouth 2 (two) times daily with a meal.   . oxybutynin (DITROPAN) 5 MG tablet Take 5 mg by mouth 1 day or 1 dose.   . pantoprazole (PROTONIX) 40 MG tablet Take 1 tablet by mouth daily.  . vitamin D, CHOLECALCIFEROL, 400 UNITS tablet Take 400 Units by mouth daily.  . [DISCONTINUED] apixaban (ELIQUIS) 5 MG TABS tablet Take 1 tablet (5 mg total) by mouth 2 (two) times daily.  . [DISCONTINUED] apixaban (ELIQUIS) 5 MG TABS tablet Take 1 tablet (5 mg total) by mouth 2 (two) times daily.  . [DISCONTINUED] aspirin 81 MG tablet Take 81 mg by mouth daily.    Allergies  Allergen Reactions  . Atorvastatin Other (See Comments)    Arthralgia, myalgia  . Fluvastatin Other (See Comments)    Arthralgia, myalgias  . Lisinopril Cough  . Metformin And Related Other (See Comments)    Anorexia, diarrhea, nausea, weight  loss  . Pravastatin Other (See Comments)    Arthralgia, myalgias  . Simvastatin Other (See Comments)    myalgias  . Spiriva Handihaler [Tiotropium Bromide Monohydrate] Nausea Only    Social History   Social History  . Marital status: Married    Spouse name: N/A  . Number of children: 1  . Years of education: N/A   Occupational History  . retired    Social History Main Topics  . Smoking status: Former Smoker    Packs/day: 1.00    Years: 32.00    Types: Cigarettes    Quit date: 08/27/1984  . Smokeless tobacco: Never Used  . Alcohol use No  . Drug use: No  . Sexual activity: Not Asked   Other Topics Concern  . None   Social History Narrative   He is a married father of one. Does not smoke and does not drink. He walks routinely and also does stationary bike. 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  Wt Readings from Last 3 Encounters:  04/29/17 171 lb 3.2 oz (77.7 kg)  10/29/16 179 lb 12.8 oz (81.6 kg)  10/26/16 178 lb (80.7 kg)    PHYSICAL EXAM BP 112/72   Pulse 84   Ht 6' (1.829 m)   Wt 171 lb 3.2 oz (77.7 kg)   BMI 23.22 kg/m  Physical Exam  Constitutional: He is oriented to person, place, and time. He appears well-developed and well-nourished. No distress.  Healthy appearing. - now looks his stated age  HENT:  Head: Normocephalic and atraumatic.  Neck: Neck supple. No hepatojugular reflux and no JVD present. Carotid bruit is not present.  Cardiovascular: Normal rate, regular rhythm and normal pulses.   Occasional extrasystoles are present. PMI is not displaced.  Exam reveals no gallop.   Murmur heard.  Harsh  crescendo-decrescendo early systolic murmur is present with a grade of 2/6  at the upper right sternal border radiating to the neck Pulmonary/Chest: Effort normal  and breath sounds normal. No respiratory distress. He has no wheezes. He has no rales.  Abdominal: Soft. Bowel sounds are normal. He exhibits no distension. There is no tenderness. There is no rebound.  Musculoskeletal: Normal range of motion. He exhibits no edema.  Neurological: He is alert and oriented to person, place, and time.  Skin: Skin is warm and dry. No rash noted. No erythema.  Psychiatric: He has a normal mood and affect. His behavior is normal. Judgment and thought content normal.  Nursing note and vitals reviewed.   Adult ECG Report n/a  Other studies Reviewed: Additional studies/ records that were reviewed today include:  Recent Labs:  Due for physical in January for labs   ASSESSMENT / PLAN: Problem List Items Addressed This Visit    Anticoagulant long-term use (Chronic)   CAD S/P PTCA-PCI then CABG x4 - Primary (Chronic)    Stable with no further angina Sx - Last Cath in 2005.  Per prior discussion, no further ST unless he starts to have worsening Symptoms c/w angina. - ON ASA & Statin, but no Beta Blocker or ACE-I for hypotension & bradycardia concerns. - With DOAC Rx,  Can D/c ASA (bruising)      Relevant Medications   apixaban (ELIQUIS) 5 MG TABS tablet   Chronic episodic atrial fibrillation (HCC); CHA2DS2-VASc Score =4, On Warfarin (Chronic)    On Exam, he seems to be in NSR, but truthfully does not note any real change of symptoms with Afib. He has not noted any breakthrough episodes of RVR. - Stopped Amiodarone last visit with no adverse repercussions.    Converted from warfarin to DOAC - has bruising, but more stable control.  -- d/c ASA       Relevant Medications   apixaban (ELIQUIS) 5 MG TABS tablet   Dyslipidemia, goal LDL below 70 (Chronic)    Seems to be tolerating Crestor - labs not  available -- look forward to labs from PCP in January.      Relevant Medications   apixaban (ELIQUIS) 5 MG TABS tablet   Essential hypertension (Chronic)    No longer a major issue -- continue with no antihypertensives. BP looks excellent.      Relevant Medications   apixaban (ELIQUIS) 5 MG TABS tablet   Hypotension due to drugs    BP much improved off of all medications.      Relevant Medications   apixaban (ELIQUIS) 5 MG TABS tablet   ST elevation myocardial infarction (STEMI) of inferior wall, subsequent episode of care (HCC) (Chronic)   Relevant Medications   apixaban (ELIQUIS) 5 MG TABS tablet      Current medicines are reviewed at length with the patient today. (+/- concerns) n/a The following changes have been made: OK to stop ASA.  Patient Instructions  MEDICATION CHANGES  STOP TAKING ASPIRIN DAILY  NO OTHER CHANGES    Your physician wants you to follow-up in [place] [address] [place].You will receive a reminder letter in the mail two months in advance. If you don't receive a letter, please call our office to schedule the follow-up appointment.   If you need a refill on your cardiac medications before your next appointment, please call your pharmacy.    Studies Ordered:   No orders of the defined types were placed in this encounter.     Glenetta Hew, M.D., M.S. Interventional Cardiologist   Pager # [phone] Phone # [phone] [address] [place], [place] [address]

## 2017-04-29 NOTE — Patient Instructions (Addendum)
MEDICATION CHANGES  STOP TAKING ASPIRIN DAILY  NO OTHER CHANGES    Your physician wants you to follow-up in [place] [address] [place].You will receive a reminder letter in the mail two months in advance. If you don't receive a letter, please call our office to schedule the follow-up appointment.   If you need a refill on your cardiac medications before your next appointment, please call your pharmacy.

## 2017-05-01 NOTE — Assessment & Plan Note (Signed)
No longer a major issue -- continue with no antihypertensives. BP looks excellent.

## 2017-05-01 NOTE — Assessment & Plan Note (Signed)
BP much improved off of all medications.

## 2017-05-01 NOTE — Assessment & Plan Note (Addendum)
Stable with no further angina Sx - Last Cath in 2005.  Per prior discussion, no further ST unless he starts to have worsening Symptoms c/w angina. - ON ASA & Statin, but no Beta Blocker or ACE-I for hypotension & bradycardia concerns. - With DOAC Rx,  Can D/c ASA (bruising)

## 2017-05-01 NOTE — Assessment & Plan Note (Signed)
On Exam, he seems to be in NSR, but truthfully does not note any real change of symptoms with Afib. He has not noted any breakthrough episodes of RVR. - Stopped Amiodarone last visit with no adverse repercussions.    Converted from warfarin to DOAC - has bruising, but more stable control.  -- d/c ASA

## 2017-05-01 NOTE — Assessment & Plan Note (Signed)
Seems to be tolerating Crestor - labs not available -- look forward to labs from PCP in January.

## 2017-05-05 ENCOUNTER — Telehealth: Payer: Self-pay | Admitting: *Deleted

## 2017-05-05 NOTE — Telephone Encounter (Signed)
FAXED PATIENT ASSISTANCE FORMS TO [facility] ELIQUIS 5 MG TWICE A DAY -

## 2017-05-25 ENCOUNTER — Ambulatory Visit: Payer: PPO | Admitting: Podiatry

## 2017-06-08 NOTE — Telephone Encounter (Signed)
PATIENT RECEIVED ASSISTANCE FROM PATIENT ASSISTAANCE

## 2017-06-09 DIAGNOSIS — L570 Actinic keratosis: Secondary | ICD-10-CM | POA: Diagnosis not present

## 2017-06-09 DIAGNOSIS — L905 Scar conditions and fibrosis of skin: Secondary | ICD-10-CM | POA: Diagnosis not present

## 2017-06-09 DIAGNOSIS — Z85828 Personal history of other malignant neoplasm of skin: Secondary | ICD-10-CM | POA: Diagnosis not present

## 2017-07-23 DIAGNOSIS — Z125 Encounter for screening for malignant neoplasm of prostate: Secondary | ICD-10-CM | POA: Diagnosis not present

## 2017-07-23 DIAGNOSIS — Z008 Encounter for other general examination: Secondary | ICD-10-CM | POA: Diagnosis not present

## 2017-08-04 DIAGNOSIS — Z0001 Encounter for general adult medical examination with abnormal findings: Secondary | ICD-10-CM | POA: Diagnosis not present

## 2017-08-04 DIAGNOSIS — E119 Type 2 diabetes mellitus without complications: Secondary | ICD-10-CM | POA: Diagnosis not present

## 2017-08-04 DIAGNOSIS — I959 Hypotension, unspecified: Secondary | ICD-10-CM | POA: Diagnosis not present

## 2017-08-04 DIAGNOSIS — N183 Chronic kidney disease, stage 3 (moderate): Secondary | ICD-10-CM | POA: Diagnosis not present

## 2017-08-04 DIAGNOSIS — I480 Paroxysmal atrial fibrillation: Secondary | ICD-10-CM | POA: Diagnosis not present

## 2017-08-04 DIAGNOSIS — E782 Mixed hyperlipidemia: Secondary | ICD-10-CM | POA: Diagnosis not present

## 2017-08-04 DIAGNOSIS — R740 Nonspecific elevation of levels of transaminase and lactic acid dehydrogenase [LDH]: Secondary | ICD-10-CM | POA: Diagnosis not present

## 2017-08-04 DIAGNOSIS — E039 Hypothyroidism, unspecified: Secondary | ICD-10-CM | POA: Diagnosis not present

## 2017-09-09 ENCOUNTER — Other Ambulatory Visit: Payer: Self-pay | Admitting: Internal Medicine

## 2017-09-27 DIAGNOSIS — E119 Type 2 diabetes mellitus without complications: Secondary | ICD-10-CM | POA: Diagnosis not present

## 2017-10-01 DIAGNOSIS — E119 Type 2 diabetes mellitus without complications: Secondary | ICD-10-CM | POA: Diagnosis not present

## 2017-10-04 ENCOUNTER — Telehealth: Payer: Self-pay | Admitting: *Deleted

## 2017-10-04 NOTE — Telephone Encounter (Signed)
Faxed patient assistance form to [facility]- for eliquis

## 2017-10-28 ENCOUNTER — Ambulatory Visit: Payer: PPO | Admitting: Neurology

## 2017-10-28 ENCOUNTER — Ambulatory Visit: Payer: PPO | Admitting: Cardiology

## 2017-11-01 ENCOUNTER — Ambulatory Visit: Payer: PPO | Admitting: Cardiology

## 2017-11-01 ENCOUNTER — Ambulatory Visit: Payer: PPO | Admitting: Internal Medicine

## 2017-11-01 ENCOUNTER — Encounter: Payer: Self-pay | Admitting: Cardiology

## 2017-11-01 ENCOUNTER — Encounter: Payer: Self-pay | Admitting: Internal Medicine

## 2017-11-01 VITALS — BP 123/68 | HR 106 | Ht 71.5 in | Wt 183.2 lb

## 2017-11-01 DIAGNOSIS — I2510 Atherosclerotic heart disease of native coronary artery without angina pectoris: Secondary | ICD-10-CM | POA: Diagnosis not present

## 2017-11-01 DIAGNOSIS — R011 Cardiac murmur, unspecified: Secondary | ICD-10-CM | POA: Diagnosis not present

## 2017-11-01 DIAGNOSIS — K5100 Ulcerative (chronic) pancolitis without complications: Secondary | ICD-10-CM

## 2017-11-01 DIAGNOSIS — I482 Chronic atrial fibrillation, unspecified: Secondary | ICD-10-CM

## 2017-11-01 DIAGNOSIS — E785 Hyperlipidemia, unspecified: Secondary | ICD-10-CM | POA: Diagnosis not present

## 2017-11-01 DIAGNOSIS — I10 Essential (primary) hypertension: Secondary | ICD-10-CM

## 2017-11-01 MED ORDER — DILTIAZEM HCL ER COATED BEADS 120 MG PO CP24: 120.0000 mg | ORAL_CAPSULE | Freq: Every day | ORAL | 3 refills | Status: DC

## 2017-11-01 MED ORDER — BALSALAZIDE DISODIUM 750 MG PO CAPS: ORAL_CAPSULE | ORAL | 3 refills | Status: DC

## 2017-11-01 NOTE — Assessment & Plan Note (Signed)
Asymptomatic RTC 1 year Continue balsalazide

## 2017-11-01 NOTE — Progress Notes (Signed)
PCP: Derinda Late, MD  Clinic Note: Chief Complaint  Patient presents with  . Follow-up    No notable symptoms.  Just feels "lazy "  . Coronary Artery Disease    No anginal symptoms.  . Atrial Fibrillation    He is relatively oblivious    HPI: Timothy Mclean is a 77 y.o. male with a PMH below who presents today for 6 Month follow-up for CAD and persistent atrial fibrillation. He has a history of CABGx4 in 1997 (after PTCA-stent of LCx in1987)with false positive Myoview in January 2005. No further testing since then. He is no longer on amiodarone and warfarin for his A. Fib. -Was converted to Eliquis, and is not on amiodarone anymore. He also has hypertension hyperlipidemia and diabetes. Complicated by COPD.  [name] was last seen April 29, 2017 --He was doing fairly well at that time.  No sensation of anyA. fib since being off amiodarone.  He seem to be tolerating Eliquis and enjoying it much more than warfarin.  No significant anginal symptoms.  He uses a cane to walk.  Recent Hospitalizations: n/a  Studies Personally Reviewed - (if available, images/films reviewed: From Epic Chart or Care Everywhere)  n/a  Interval History: [name] returns today overall doing okay.  He does not noticed that he is in A. fib with a faster heart rate.  He denies any rapid irregular heartbeat sensations.  (Of note, when I saw him his heart rate was not 106 beats a minute it was in the 80s). He clearly is not as active as he used to be, But really denies any significant chest discomfort or dyspnea with rest or exertion.  He is more limited by arthritis symptoms.  He denies any PND, orthopnea with minimal edema.  No chest discomfort or pressure with rest or exertion. He does have some balance issues but denies any dizziness or wooziness.  May be some mild orthostatic symptoms.  No syncope or near syncope, TIA/amaurosis fugax.  No bleeding issues such as melena, hematochezia, hematuria, epistaxis  or significant bruising on Eliquis.  In fact it is better than it was on warfarin.  He still uses a cane to walk because of balance issues.  He also just notes that he feels a little bit "lazy ".  He does not feel like doing the type of exercise that he would usually want to. Since adjusting his diabetes medications now he is stabilizing his weight. Overall he is in a pretty good mood and would just assume keep things stable.  ROS: A comprehensive was performed. Review of Systems  Constitutional: Negative for malaise/fatigue (Not fatigue, just feels "lazy ").  HENT: Negative for congestion.   Respiratory: Negative for shortness of breath.   Cardiovascular: Positive for leg swelling (Trivial).  Gastrointestinal: Positive for constipation.  Musculoskeletal: Positive for joint pain. Negative for falls and myalgias.  Neurological: Positive for dizziness (just poor balance). Negative for weakness.  Psychiatric/Behavioral: Negative for depression and memory loss. The patient does not have insomnia.   All other systems reviewed and are negative.  I have reviewed and (if needed) personally updated the patient's problem list, medications, allergies, past medical and surgical history, social and family history.   Past Medical History:  Diagnosis Date  . Allergy   . Anxiety   . Asthma    since 2013  . Barrett's esophagus 03/09/2014  . Bilateral lumbar radiculopathy    since 2013  . CAD S/P percutaneous coronary angioplasty 1987  a. 1987 angioplasty and BMS- Cx; b. 1997 CABG x 4;  c. MYOVIEW 1/'05: ? Ischemia vs. artifact --> sent for cath - patent grafts.  . Chronic atrial fibrillation ([place])    a. Prev on amiodarone-->d/c 08/2016 in setting of recurrent AF, LFT abnormalities, and pulmonary symptoms;  b. CHA2DS2VASc = 4-->eliquis.  . Chronic bronchitis ([place])    since 2007  . COPD (chronic obstructive pulmonary disease) ([place])   . DDD (degenerative disc disease)   . Diverticulosis   .  Dyslipidemia, goal LDL below 70    Doing better off of Zocor, currently on Lescol   . ED (erectile dysfunction)   . Elevated LFTs   . Esophageal reflux   . GERD (gastroesophageal reflux disease)   . Gout, unspecified   . Hearing loss   . Helicobacter pylori gastritis 07/2009  . Herpes zoster 2009  . Hypothyroidism    since 2010  . Iron deficiency anemia, unspecified   . Leg weakness    a. 09/2016 ABI's: R 1.05, L 0.98.  [name] Other and unspecified hyperlipidemia   . Other B-complex deficiencies   . Personal history of colonic polyps   . Prostatitis    chronic ulcerative  . Pulmonary fibrosis ([place])    and bronchiectasis, since 2015  . Renal stone    Right  . S/P CABG x 4 1997   a) LIMA-LAD, SVG to OM, SVG-dRCA-RPL; b) FALSE + MYOVIEW --> CATH 1/'05: 100% LAD after widelly patent D1, all Cx OM branches 100%, pRCA 100%, SVG-PDA patent w/ retrograde filling of RPL, SVG-OM widely patent ~ normal OM, LIMA-LAD patent.  [name] Spinal stenosis   . ST elevation myocardial infarction (STEMI) of inferior wall, subsequent episode of care ([place]) 1987, 1997   a) PTCA of Cx; b) CABG   . Type II or unspecified type diabetes mellitus without mention of complication, not stated as uncontrolled    no meds  . Ulcerative colitis, unspecified   . Unspecified essential hypertension   . Vitamin D deficiency     Past Surgical History:  Procedure Laterality Date  . BLEPHAROPLASTY Bilateral    for ptosis  . CARDIAC CATHETERIZATION  08/17/2003   "False positive stress test " grafts patent; RCA proximal 100% LAD 100% occlusion after normal D1 with 80% ostial as SP1; circumflex patent but OM1 on to all occluded; EF 50-55%  . CARDIAC CATHETERIZATION  11/1995   Preop CABG: LAD 90% at D1, circumflex 100 and OM a 90% proximal, 80% distal  . Carotid Duplex Doppler  12/23/2009   Right&Left ICAs 0-49%, mildly abnormal study,   . CHOLECYSTECTOMY    . COLONOSCOPY    . CORONARY ARTERY BYPASS GRAFT  11/1995   LIMA-LAD,  SVG to OM, SVG-dRCA-RPL  . ESOPHAGOGASTRODUODENOSCOPY    . FLEXIBLE SIGMOIDOSCOPY    . NM MYOCAR PERF EJECTION FRACTION  07/03/2003   FALSE POSITIVE TEST; Bruce protocol, negative test with scintigraphic evidence of inferoapical scar, diaphragmatic attenuation, moderate ischemia.  [name] NM MYOVIEW LTD  2005   Questionable ischemia that is likely either infarct versus artifact; normal  . TRANSTHORACIC ECHOCARDIOGRAM  12/03/2008   LVEF =>55% normal study    Current Meds  Medication Sig  . ADVAIR DISKUS 250-50 MCG/DOSE AEPB Inhale 1 puff into the lungs 2 (two) times daily.   [name] albuterol (PROVENTIL HFA;VENTOLIN HFA) 108 (90 Base) MCG/ACT inhaler Inhale into the lungs every 6 (six) hours as needed for wheezing or shortness of breath.  [name] apixaban (ELIQUIS) 5 MG  TABS tablet Take 1 tablet (5 mg total) by mouth 2 (two) times daily.  . balsalazide (COLAZAL) 750 MG capsule TAKE 2 CAPSULES BY MOUTH TWICE A DAY  . BAYER CONTOUR TEST test strip   . CRESTOR 20 MG tablet Take 1 tablet by mouth daily.  . cyanocobalamin 2000 MCG tablet Take 2,000 mcg by mouth daily.  . finasteride (PROSCAR) 5 MG tablet Take 1 tablet by mouth daily.  [name] JANUVIA 50 MG tablet Take 50 mg by mouth daily.  [name] levothyroxine (SYNTHROID, LEVOTHROID) 75 MCG tablet Take 1 tablet by mouth daily. Take 1 tab daily  . metFORMIN (GLUCOPHAGE) 500 MG tablet Take 500 mg by mouth daily.   [name] oxybutynin (DITROPAN) 5 MG tablet Take 5 mg by mouth 1 day or 1 dose.   . pantoprazole (PROTONIX) 40 MG tablet Take 1 tablet by mouth 2 (two) times daily.   . vitamin D, CHOLECALCIFEROL, 400 UNITS tablet Take 400 Units by mouth daily.    Allergies  Allergen Reactions  . Atorvastatin Other (See Comments)    Arthralgia, myalgia  . Fluvastatin Other (See Comments)    Arthralgia, myalgias  . Lisinopril Cough  . Metformin And Related Other (See Comments)    Anorexia, diarrhea, nausea, weight loss  . Pravastatin Other (See Comments)    Arthralgia, myalgias    . Simvastatin Other (See Comments)    myalgias  . Spiriva Handihaler [Tiotropium Bromide Monohydrate] Nausea Only   Social History   Tobacco Use  . Smoking status: Former Smoker    Packs/day: 1.00    Years: 32.00    Pack years: 32.00    Types: Cigarettes    Last attempt to quit: 08/27/1984    Years since quitting: 33.2  . Smokeless tobacco: Never Used  Substance Use Topics  . Alcohol use: No  . Drug use: No   Social History   Social History Narrative   He is a married father of one. Does not smoke and does not drink. He walks routinely and also does stationary bike. 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  Wt Readings from Last 3 Encounters:  11/01/17 183 lb 3.2 oz (83.1 kg)  11/01/17 183 lb (83 kg)  04/29/17 171 lb 3.2 oz (77.7 kg)    PHYSICAL EXAM BP 123/68   Pulse (!) 106   Ht 5' 11.5" (1.816 m)   Wt 183 lb 3.2 oz (83.1 kg)   BMI 25.20 kg/m  Physical Exam  Constitutional: He is oriented to person, place, and time. He appears well-developed and well-nourished. No distress.  Healthy appearing. - now looks his stated age  HENT:  Head: Normocephalic and atraumatic.  Neck: Neck supple. No hepatojugular reflux and no JVD present. Carotid bruit is not present.  Cardiovascular: Normal rate, regular rhythm and normal pulses.  Occasional extrasystoles are present. PMI is not displaced. Exam reveals no gallop.  Murmur heard.  Harsh crescendo-decrescendo early systolic murmur is present with a grade of 2/6 at the upper right sternal border radiating to the neck. Pulmonary/Chest: Effort normal and breath sounds normal. No  respiratory distress. He has no wheezes. He has no rales.  Abdominal: Soft. Bowel sounds  are normal. He exhibits no distension. There is no tenderness. There is no rebound.  Musculoskeletal: Normal range of motion. He exhibits no edema.  Neurological: He is alert and oriented to person, place, and time.  Skin: Skin is warm and dry. No rash noted. No erythema.  Psychiatric: He has a normal mood and affect. His behavior is normal. Judgment and thought content normal.  Nursing note and vitals reviewed.   Adult ECG Report  Rate: 106;  Rhythm: atrial fibrillation, premature ventricular contractions (PVC) and Diffuse ST-T wave changes in the inferolateral leads.;   Narrative Interpretation: Clearly now in A. fib RVR but asymptomatic  Other studies Reviewed: Additional studies/ records that were reviewed today include:  Recent Labs: Supposedly he had labs checked in January.  I do not have them available.    ASSESSMENT / PLAN: This is the first time in several visits have seen [facility] in A. fib with RVR.  He is not on anything for rate control.  I am adding of diltiazem today and will have him follow-up with Almyra Deforest, PA relatively soon.  Would be reluctant to put him on antiarrhythmic since he is asymptomatic, but he may need more rate control. He can also have an echocardiogram ordered at that time to evaluate his systolic murmur.  It may simply be related to tachycardia at this point.  Problem List Items Addressed This Visit    Systolic ejection murmur    He does have a systolic ejection murmur and he has not had an evaluation of his heart in quite some time.  With some reluctance he agreed that it is not unreasonable to check an echocardiogram to ensure he does not have any worsening aortic stenosis or other abnormal findings. Plan: Consider checking 2D echo after follow-up visit.      Essential hypertension (Chronic)    Not really hypertensive.  I have therefore not use an ACE inhibitor  or  ARB.  Should build to tolerate low-dose diltiazem for rate control.      Relevant Medications   diltiazem (CARDIZEM CD) 120 MG 24 hr capsule   Dyslipidemia, goal LDL below 70 (Chronic)    Tolerating Crestor.  Labs followed by PCP.  Unavailable but were checked in January.  According to his report they were "okay ".      Relevant Medications   diltiazem (CARDIZEM CD) 120 MG 24 hr capsule   Chronic episodic atrial fibrillation (HCC); CHA2DS2-VASc Score =4, On Warfarin - Primary (Chronic)    Currently now in A. fib, borderline RVR.  This is not an uncommon finding for him, but he does not notice it. Think we just need to add some rate control.  We will start diltiazem at 120 mg daily.  This is acceptable with normal EF. Continue anticoagulation with Eliquis.      Relevant Medications   diltiazem (CARDIZEM CD) 120 MG 24 hr capsule   Other Relevant Orders   EKG 12-Lead (Completed)   CAD S/P PTCA-PCI then CABG x4 (Chronic)    No anginal symptoms.  Last Was in 2005. As per previous discussion, unless he has symptoms of worsening angina or exertional dyspnea, he would prefer to avoid any further testing. He is on statin (but no labs available).  Not on aspirin anymore because of being on Eliquis. He has not been on a beta-blocker or ACE inhibitor in the past, with normal blood pressure we have held off. Plan is to start diltiazem for rate control and avoid the  fatigue from beta-blocker. Continue to recommend staying active.      Relevant Medications   diltiazem (CARDIZEM CD) 120 MG 24 hr capsule   Other Relevant Orders   EKG 12-Lead (Completed)      Current medicines are reviewed at length with the patient today. (+/- concerns) n/a The following changes have been made:  Patient Instructions  MEDICATION INSTRUCTION   START DILTIAZEM 120 MG CAPSULE  DAILY    WILL GET LABS FROM PRIMARY   Your physician recommends that you schedule a follow-up appointment in 1 TO 2 MONTHS WITH  HAO PA     Your physician wants you to follow-up in [place].You will receive a reminder letter in the mail two months in advance. If you don't receive a letter, please call our office to schedule the follow-up appointment.   If you need a refill on your cardiac medications before your next appointment, please call your pharmacy.     Studies Ordered:   Orders Placed This Encounter  Procedures  . EKG 12-Lead      Glenetta Hew, M.D., M.S. Interventional Cardiologist   Pager # [phone] Phone # [phone] [address] [place], [place] [address]

## 2017-11-01 NOTE — Patient Instructions (Signed)
  We have sent the following medications to your pharmacy for you to pick up at your convenience: Balsalazide   Follow up with Dr Carlean Purl in a year or sooner if needed.    I appreciate the opportunity to care for you. Silvano Rusk, MD, [facility]

## 2017-11-01 NOTE — Patient Instructions (Signed)
MEDICATION INSTRUCTION   START DILTIAZEM 120 MG CAPSULE  DAILY    WILL GET LABS FROM PRIMARY   Your physician recommends that you schedule a follow-up appointment in 1 TO 2 MONTHS WITH HAO PA     Your physician wants you to follow-up in [place].You will receive a reminder letter in the mail two months in advance. If you don't receive a letter, please call our office to schedule the follow-up appointment.   If you need a refill on your cardiac medications before your next appointment, please call your pharmacy.

## 2017-11-03 ENCOUNTER — Encounter: Payer: Self-pay | Admitting: Cardiology

## 2017-11-03 NOTE — Progress Notes (Signed)
Timothy Mclean 77 y.o. Dec 17, 1940 [id number]  Assessment & Plan:   UC (ulcerative colitis) Asymptomatic RTC 1 year Continue balsalazide     Subjective:   Chief Complaint: f/u ulcerative colitis  HPI Here w/ wife - doing well no diarrhea or abdominal pain. Allergies  Allergen Reactions  . Atorvastatin Other (See Comments)    Arthralgia, myalgia  . Fluvastatin Other (See Comments)    Arthralgia, myalgias  . Lisinopril Cough  . Metformin And Related Other (See Comments)    Anorexia, diarrhea, nausea, weight loss  . Pravastatin Other (See Comments)    Arthralgia, myalgias  . Simvastatin Other (See Comments)    myalgias  . Spiriva Handihaler [Tiotropium Bromide Monohydrate] Nausea Only   Current Meds  Medication Sig  . ADVAIR DISKUS 250-50 MCG/DOSE AEPB Inhale 1 puff into the lungs 2 (two) times daily.   [name] albuterol (PROVENTIL HFA;VENTOLIN HFA) 108 (90 Base) MCG/ACT inhaler Inhale into the lungs every 6 (six) hours as needed for wheezing or shortness of breath.  [name] apixaban (ELIQUIS) 5 MG TABS tablet Take 1 tablet (5 mg total) by mouth 2 (two) times daily.  . balsalazide (COLAZAL) 750 MG capsule TAKE 2 CAPSULES BY MOUTH TWICE A DAY  . BAYER CONTOUR TEST test strip   . CRESTOR 20 MG tablet Take 1 tablet by mouth daily.  . cyanocobalamin 2000 MCG tablet Take 2,000 mcg by mouth daily.  . finasteride (PROSCAR) 5 MG tablet Take 1 tablet by mouth daily.  [name] JANUVIA 50 MG tablet Take 50 mg by mouth daily.  [name] levothyroxine (SYNTHROID, LEVOTHROID) 75 MCG tablet Take 1 tablet by mouth daily. Take 1 tab daily  . metFORMIN (GLUCOPHAGE) 500 MG tablet Take 500 mg by mouth daily.   [name] oxybutynin (DITROPAN) 5 MG tablet Take 5 mg by mouth 1 day or 1 dose.   . pantoprazole (PROTONIX) 40 MG tablet Take 1 tablet by mouth 2 (two) times daily.   . vitamin D, CHOLECALCIFEROL, 400 UNITS tablet Take 400 Units by mouth daily.  . [DISCONTINUED] balsalazide (COLAZAL) 750 MG capsule TAKE 2 CAPSULES  BY MOUTH TWICE A DAY   Past Medical History:  Diagnosis Date  . Allergy   . Anxiety   . Asthma    since 2013  . Barrett's esophagus 03/09/2014  . Bilateral lumbar radiculopathy    since 2013  . CAD S/P percutaneous coronary angioplasty 1987   a. 1987 angioplasty and BMS- Cx; b. 1997 CABG x 4;  c. MYOVIEW 1/'05: ? Ischemia vs. artifact --> sent for cath - patent grafts.  . Chronic atrial fibrillation ([place])    a. Prev on amiodarone-->d/c 08/2016 in setting of recurrent AF, LFT abnormalities, and pulmonary symptoms;  b. CHA2DS2VASc = 4-->eliquis.  . Chronic bronchitis ([place])    since 2007  . COPD (chronic obstructive pulmonary disease) ([place])   . DDD (degenerative disc disease)   . Diverticulosis   . Dyslipidemia, goal LDL below 70    Doing better off of Zocor, currently on Lescol   . ED (erectile dysfunction)   . Elevated LFTs   . Esophageal reflux   . GERD (gastroesophageal reflux disease)   . Gout, unspecified   . Hearing loss   . Helicobacter pylori gastritis 07/2009  . Herpes zoster 2009  . Hypothyroidism    since 2010  . Iron deficiency anemia, unspecified   . Leg weakness    a. 09/2016 ABI's: R 1.05, L 0.98.  [name] Other and unspecified hyperlipidemia   .  Other B-complex deficiencies   . Personal history of colonic polyps   . Prostatitis    chronic ulcerative  . Pulmonary fibrosis ([place])    and bronchiectasis, since 2015  . Renal stone    Right  . S/P CABG x 4 1997   a) LIMA-LAD, SVG to OM, SVG-dRCA-RPL; b) FALSE + MYOVIEW --> CATH 1/'05: 100% LAD after widelly patent D1, all Cx OM branches 100%, pRCA 100%, SVG-PDA patent w/ retrograde filling of RPL, SVG-OM widely patent ~ normal OM, LIMA-LAD patent.  [name] Spinal stenosis   . ST elevation myocardial infarction (STEMI) of inferior wall, subsequent episode of care ([place]) 1987, 1997   a) PTCA of Cx; b) CABG   . Type II or unspecified type diabetes mellitus without mention of complication, not stated as uncontrolled    no meds    . Ulcerative colitis, unspecified   . Unspecified essential hypertension   . Vitamin D deficiency    Past Surgical History:  Procedure Laterality Date  . BLEPHAROPLASTY Bilateral    for ptosis  . CARDIAC CATHETERIZATION  08/17/2003   "False positive stress test " grafts patent; RCA proximal 100% LAD 100% occlusion after normal D1 with 80% ostial as SP1; circumflex patent but OM1 on to all occluded; EF 50-55%  . CARDIAC CATHETERIZATION  11/1995   Preop CABG: LAD 90% at D1, circumflex 100 and OM a 90% proximal, 80% distal  . Carotid Duplex Doppler  12/23/2009   Right&Left ICAs 0-49%, mildly abnormal study,   . CHOLECYSTECTOMY    . COLONOSCOPY    . CORONARY ARTERY BYPASS GRAFT  11/1995   LIMA-LAD, SVG to OM, SVG-dRCA-RPL  . ESOPHAGOGASTRODUODENOSCOPY    . FLEXIBLE SIGMOIDOSCOPY    . NM MYOCAR PERF EJECTION FRACTION  07/03/2003   FALSE POSITIVE TEST; Bruce protocol, negative test with scintigraphic evidence of inferoapical scar, diaphragmatic attenuation, moderate ischemia.  [name] NM MYOVIEW LTD  2005   Questionable ischemia that is likely either infarct versus artifact; normal  . TRANSTHORACIC ECHOCARDIOGRAM  12/03/2008   LVEF =>55% normal study   Social History   Social History Narrative   He is a married father of one. Does not smoke and does not drink. He walks routinely and also does stationary bike. 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   Review of Systems As above NAD BP 114/60 (BP Location: Left Arm, Patient Position: Sitting, Cuff Size: Normal)   Pulse (!) 108 Comment: irregular  Ht 5' 9.75" (1.772 m)   Wt 183 lb (83 kg)    BMI 26.45 kg/m    Objective:   Physical Exam

## 2017-11-04 NOTE — Assessment & Plan Note (Signed)
No anginal symptoms.  Last Was in 2005. As per previous discussion, unless he has symptoms of worsening angina or exertional dyspnea, he would prefer to avoid any further testing. He is on statin (but no labs available).  Not on aspirin anymore because of being on Eliquis. He has not been on a beta-blocker or ACE inhibitor in the past, with normal blood pressure we have held off. Plan is to start diltiazem for rate control and avoid the fatigue from beta-blocker. Continue to recommend staying active.

## 2017-11-04 NOTE — Assessment & Plan Note (Signed)
Not really hypertensive.  I have therefore not use an ACE inhibitor  or ARB.  Should build to tolerate low-dose diltiazem for rate control.

## 2017-11-04 NOTE — Assessment & Plan Note (Signed)
Currently now in A. fib, borderline RVR.  This is not an uncommon finding for him, but he does not notice it. Think we just need to add some rate control.  We will start diltiazem at 120 mg daily.  This is acceptable with normal EF. Continue anticoagulation with Eliquis.

## 2017-11-04 NOTE — Assessment & Plan Note (Signed)
He does have a systolic ejection murmur and he has not had an evaluation of his heart in quite some time.  With some reluctance he agreed that it is not unreasonable to check an echocardiogram to ensure he does not have any worsening aortic stenosis or other abnormal findings. Plan: Consider checking 2D echo after follow-up visit.

## 2017-11-04 NOTE — Assessment & Plan Note (Signed)
Tolerating Crestor.  Labs followed by PCP.  Unavailable but were checked in January.  According to his report they were "okay ".

## 2017-11-13 NOTE — Progress Notes (Signed)
Lab results from November 04, 2017: CBC: W8.5, H/H 14.2/44.9.  Platelet 202. Sodium 138, potassium 4.6, chloride 98, bicarb 27, BUN 23, creatinine 1.63, glucose 130, calcium 10.0.  AST 23, ALT 13, ALP 70. Hs-CRP 0.6 (<1.0)  NMR panel: LDL-P 503 (goal <1000); LDL-C 69m/dL (pretty much @ goal), HDL-C 34 mg/dL (Low); TG 126 (g), TC 110. HDL-P (total) 28.5 (g > 30.5) - pretty good.  Small LDL-P 204 (goal <527). LDL Size 20.5 (goal > 20.5).  Large VLDL-P 3.0 (g < 2.7); Large HDL-P 8.5 (g > 4.8), HDL Size 9.7 (g > 9.2).  LP-IR 37 (g < 45)  Overall pretty good NMR panel. -- Continue current Rx of Crestor. DGlenetta Hew MD

## 2017-11-18 ENCOUNTER — Ambulatory Visit (INDEPENDENT_AMBULATORY_CARE_PROVIDER_SITE_OTHER): Payer: PPO | Admitting: Neurology

## 2017-11-18 ENCOUNTER — Encounter: Payer: Self-pay | Admitting: Neurology

## 2017-11-18 VITALS — BP 123/68 | HR 107 | Ht 72.0 in | Wt 182.0 lb

## 2017-11-18 DIAGNOSIS — R2689 Other abnormalities of gait and mobility: Secondary | ICD-10-CM

## 2017-11-18 NOTE — Progress Notes (Signed)
Subjective:    Patient ID: Timothy Mclean is a 77 y.o. male.  HPI     Interim history:   Mr. Timothy Mclean is a 77 year old right-handed gentleman with an underlying complex medical history of reflux disease, hypertension, chronic lung disease, vitamin D deficiency, vitamin B12 deficiency, thyroid disease, type 2 diabetes, heart disease, status post MI and PTCA in 1987, status post CABG in 1997, paroxysmal atrial fibrillation, now on Eliquis, gout, BPH, kidney stone, diverticulosis, shingles in 2009, who presents for follow-up consultation of his lower extremity weakness and numbness and gait d/o. He is accompanied by his wife again today.  I last saw him on 10/26/2017, at which time he reported feeling stable. She was on Eliquis per cardiology, no longer on Coumadin. He was also off of amiodarone as well as verapamil. He was trying to drink water and exercise more. We mutually agreed to monitor his symptoms and recheck in one year, he was advised to pursue regular exercise and healthy lifestyle.    Today, 11/18/17 (all dictated new, as well as above notes, some dictation done in note pad or Word, outside of chart, may appear as copied):  He reports doing okay, feeling stable, likes to drink soda, tea, less water. Was using his stationary bike, but not very regularly now. Has been started on Diltiazem for rate control, now also on Eliquis, no longer on ASA. No recent falls.   The patient's allergies, current medications, family history, past medical history, past social history, past surgical history and problem list were reviewed and updated as appropriate.    Previously (copied from previous notes for reference):   I saw him on 02/26/2016, at which time he was fairly stable. He was not exercising very much. He was advised to decrease his soda and tea intake and increase his water intake and exercise his lower body with a stationary bike.   I saw him on 08/29/2015, at which time his MMSE was 26 out of  30, clock drawing was 4 out of 4, animal fluency was 10/m. He had received a new hearing aid on the right side which helped his hearing. He had no recent falls. He was tolerating the Crestor and had reduced his soda intake. He was encouraged to increase his water intake and use his cane at all times.   I saw him on 02/26/2015, at which time he reported doing fairly well. He had not fallen recently. He finished outpatient physical therapy on 12/18/2014. I reviewed the discharge summary at the time. He was given home exercises as well. His appetite was stable. His weight had been more stable. He has adjusted to his new dentures. His MMSE was 26/30 (missed 1 point on serial sevens, 2 points on orientation, 1 point on remote recall), CDT: 4/4, AFT: 9/min. I asked him to stay active mentally and physically, drink plenty of water, make sure he eats regularly and nutritious food and that he use a cane for safety.   I saw him on 09/27/2014, at which time he reported doing fairly well. He was treated for fungal infection of his foot by his primary care physician. His wife reported that he had some memory loss and occasional confusion. He had lost some weight. He had a checkup with Dr. Sandi Mariscal on 07/13/2014 and I reviewed his blood test results from that visit: CK was 154, vitamin B12 was 1392, methylmalonic acid was 90, CBC with differential was unremarkable, CMP showed a glucose of 114 and otherwise unremarkable, uric  acid normal, CRP normal, TSH elevated at 11, triglycerides 118, LDL within normal. He was not drinking enough water and had no recent falls. He was drinking a lot of [facility]. He had recent teeth extractions. He was maintaining well on Crestor. His exam was stable. I suggested outpatient physical therapy. I referred him to neuro rehabilitation for this. I asked to stay better hydrated.    I saw him on 05/29/14, at which time he reported taking B12 orally. He was on Crestor. He saw a spine specialist  at [facility], who did a lower spine injection, which he felt helped. He was not on Lescol or lovastatin. He felt a little better with his walking as he was using a cane. He had not fallen. He was using his stationary bike a little bit more regularly. I felt that he had a gait disorder secondary to peripheral neuropathy, degenerative lower spine disease, possibly medication side effects, his hemoglobin A1c was suboptimal in June 2015 at 7.5. He was advised to see his primary care physician for recheck on his A1c and B12 levels. He was taking his B12. I suggested we recheck his CK levels which were done on 05/29/2014. This came back as normal and we advised him of the test results in writing.     I first met him on 12/26/2013 at the request of his primary care physician, at which time the patient reported lower extremity pain, weakness and numbness associated with a history of low back pain since 2012. I suggested further workup in the form of EMG nerve conduction studies to the lower extremities, blood work.   Blood work from 12/26/2013 showed a hemoglobin A1c of 7.5, methylmalonic acid was elevated at 469, ANA was negative, CRP was normal, aldolase was borderline at 10.4, CK was 451. EMG and nerve conduction testing from 01/18/2014 showed: bilateral lumbar radiculopathies, involving bilateral L4, L5, S1 myotomes. In addition, there is evidence of chronic right cervical radiculopathy, involving right C5 nerve roots. There was also evidence of length dependent mild axonal peripheral neuropathy. I called his wife with the test results. I suggested evaluation with a spine specialist.   He was found to be intolerant to Lipitor generic and was taken off of it. He was switched to pravastatin but this was also subsequently discontinued with some subjective improvement of his symptoms. He had a mild increase in CK at 258 which subsequently returned to normal after discontinuation of his Zocor. He has known degenerative  lower back disease with an MRI showing impingement at L4-5 and L5-S1 level, he has had multiple steroid epidural injections. Because of his vascular risk factors he was started on lovastatin. His last epidural injection was in January 2015 which was not helpful. Lovastatin was discontinued in February 2015. CK level was mildly elevated at 338. His B12 supplement dose was increased recently. He had a CT head without contrast on 01/11/2013 after a fall. This showed generalized atrophy, mild chronic microvascular ischemic changes in the white matter, no acute infarct. He also had a CT cervical spine without contrast at the time which showed moderate degenerative changes, no fracture. MRI lumbar spine without contrast on 08/13/2011 showed lumbar spondylosis and degenerative disease, causing marked impingement at L4-5 and moderate impingement at L5-S1. He was started on Crestor but was initially also taking the Lovastatin 40 mg daily as well. Patient was not taking B12 injections or oral B12.   He has some tingling in the feet. He has been diabetic for about  5 years. He has no significant foot pain. He denies fasciculations and atrophy, and myoclonus. His legs gives out some and he fatigues easily. He has not had recurrent falls, but did take a fall outside at his niece's ballgame, it was raining and the terrain was hilly and slick and rocky. 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 He volunteers at the firestation.   He quit smoking in '86 and does not drink alcohol. There is no FHx muscle or nerve disease.    His Past Medical History Is Significant For: Past Medical History:  Diagnosis Date  . Allergy   . Anxiety   . Asthma    since 2013  . Barrett's esophagus 03/09/2014  . Bilateral lumbar radiculopathy    since 2013  . CAD S/P percutaneous coronary angioplasty 1987   a. 1987 angioplasty and BMS- Cx; b. 1997 CABG x 4;  c. MYOVIEW 1/'05: ? Ischemia vs. artifact --> sent  for cath - patent grafts.  . Chronic atrial fibrillation ([place])    a. Prev on amiodarone-->d/c 08/2016 in setting of recurrent AF, LFT abnormalities, and pulmonary symptoms;  b. CHA2DS2VASc = 4-->eliquis.  . Chronic bronchitis ([place])    since 2007  . COPD (chronic obstructive pulmonary disease) ([place])   . DDD (degenerative disc disease)   . Diverticulosis   . Dyslipidemia, goal LDL below 70    Doing better off of Zocor, currently on Lescol   . ED (erectile dysfunction)   . Elevated LFTs   . Esophageal reflux   . GERD (gastroesophageal reflux disease)   . Gout, unspecified   . Hearing loss   . Helicobacter pylori gastritis 07/2009  . Herpes zoster 2009  . Hypothyroidism    since 2010  . Iron deficiency anemia, unspecified   . Leg weakness    a. 09/2016 ABI's: R 1.05, L 0.98.  [name] Other and unspecified hyperlipidemia   . Other B-complex deficiencies   . Personal history of colonic polyps   . Prostatitis    chronic ulcerative  . Pulmonary fibrosis ([place])    and bronchiectasis, since 2015  . Renal stone    Right  . S/P CABG x 4 1997   a) LIMA-LAD, SVG to OM, SVG-dRCA-RPL; b) FALSE + MYOVIEW --> CATH 1/'05: 100% LAD after widelly patent D1, all Cx OM branches 100%, pRCA 100%, SVG-PDA patent w/ retrograde filling of RPL, SVG-OM widely patent ~ normal OM, LIMA-LAD patent.  [name] Spinal stenosis   . ST elevation myocardial infarction (STEMI) of inferior wall, subsequent episode of care ([place]) 1987, 1997   a) PTCA of Cx; b) CABG   . Type II or unspecified type diabetes mellitus without mention of complication, not stated as uncontrolled    no meds  . Ulcerative colitis, unspecified   . Unspecified essential hypertension   . Vitamin D deficiency     His Past Surgical History Is Significant For: Past Surgical History:  Procedure Laterality Date  . BLEPHAROPLASTY Bilateral    for ptosis  . CARDIAC CATHETERIZATION  08/17/2003   "False positive stress test " grafts patent; RCA proximal 100%  LAD 100% occlusion after normal D1 with 80% ostial as SP1; circumflex patent but OM1 on to all occluded; EF 50-55%  . CARDIAC CATHETERIZATION  11/1995   Preop CABG: LAD 90% at D1, circumflex 100 and OM a 90% proximal, 80% distal  . Carotid Duplex Doppler  12/23/2009   Right&Left ICAs 0-49%, mildly abnormal study,   .  CHOLECYSTECTOMY    . COLONOSCOPY    . CORONARY ARTERY BYPASS GRAFT  11/1995   LIMA-LAD, SVG to OM, SVG-dRCA-RPL  . ESOPHAGOGASTRODUODENOSCOPY    . FLEXIBLE SIGMOIDOSCOPY    . NM MYOCAR PERF EJECTION FRACTION  07/03/2003   FALSE POSITIVE TEST; Bruce protocol, negative test with scintigraphic evidence of inferoapical scar, diaphragmatic attenuation, moderate ischemia.  [name] NM MYOVIEW LTD  2005   Questionable ischemia that is likely either infarct versus artifact; normal  . TRANSTHORACIC ECHOCARDIOGRAM  12/03/2008   LVEF =>55% normal study    His Family History Is Significant For: Family History  Problem Relation Age of Onset  . Diabetes Mother   . Congestive Heart Failure Mother   . Dementia Father   . Pneumonia Father        died of this at age 59, hx of bronchiectasis  . Heart disease Sister   . Diabetes Sister   . Lung cancer Brother        died at age 21  . Hypertension Sister   . CVA Sister   . Colon cancer Neg Hx   . Esophageal cancer Neg Hx   . Rectal cancer Neg Hx   . Stomach cancer Neg Hx     His Social History Is Significant For: Social History   Socioeconomic History  . Marital status: Married    Spouse name: Not on file  . Number of children: 1  . Years of education: Not on file  . Highest education level: Not on file  Occupational History  . Occupation: retired  Scientific laboratory technician  . Financial resource strain: Not on file  . Food insecurity:    Worry: Not on file    Inability: Not on file  . Transportation needs:    Medical: Not on file    Non-medical: Not on file  Tobacco Use  . Smoking status: Former Smoker    Packs/day: 1.00    Years:  32.00    Pack years: 32.00    Types: Cigarettes    Last attempt to quit: 08/27/1984    Years since quitting: 33.2  . Smokeless tobacco: Never Used  Substance and Sexual Activity  . Alcohol use: No  . Drug use: No  . Sexual activity: Not on file  Lifestyle  . Physical activity:    Days per week: Not on file    Minutes per session: Not on file  . Stress: Not on file  Relationships  . Social connections:    Talks on phone: Not on file    Gets together: Not on file    Attends religious service: Not on file    Active member of club or organization: Not on file    Attends meetings of clubs or organizations: Not on file    Relationship status: Not on file  Other Topics Concern  . Not on file  Social History Narrative   He is a married father of one. Does not smoke and does not drink. He walks routinely and also does stationary bike. 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 Atorvastatin Other (See Comments)    Arthralgia, myalgia  . Fluvastatin Other (See Comments)    Arthralgia, myalgias  . Lisinopril Cough  . Metformin And Related Other (See Comments)    Anorexia, diarrhea, nausea, weight loss  . Pravastatin Other (See Comments)  Arthralgia, myalgias  . Simvastatin Other (See Comments)    myalgias  . Spiriva Handihaler [Tiotropium Bromide Monohydrate] Nausea Only  :   His Current Medications Are:  Outpatient Encounter Medications as of 11/18/2017  Medication Sig  . ADVAIR DISKUS 250-50 MCG/DOSE AEPB Inhale 1 puff into the lungs 2 (two) times daily.   [name] albuterol (PROVENTIL HFA;VENTOLIN HFA) 108 (90 Base) MCG/ACT inhaler Inhale into the lungs every 6 (six) hours as needed for wheezing or shortness of breath.  [name] apixaban (ELIQUIS) 5 MG TABS tablet Take 1 tablet (5 mg total) by mouth 2 (two) times daily.  . balsalazide  (COLAZAL) 750 MG capsule TAKE 2 CAPSULES BY MOUTH TWICE A DAY  . BAYER CONTOUR TEST test strip   . CRESTOR 20 MG tablet Take 1 tablet by mouth daily.  . cyanocobalamin 2000 MCG tablet Take 2,000 mcg by mouth daily.  [name] diltiazem (CARDIZEM CD) 120 MG 24 hr capsule Take 1 capsule (120 mg total) by mouth daily.  . finasteride (PROSCAR) 5 MG tablet Take 1 tablet by mouth daily.  [name] JANUVIA 50 MG tablet Take 50 mg by mouth daily.  [name] levothyroxine (SYNTHROID, LEVOTHROID) 75 MCG tablet Take 1 tablet by mouth daily. Take 1 tab daily  . metFORMIN (GLUCOPHAGE) 500 MG tablet Take 500 mg by mouth daily.   [name] oxybutynin (DITROPAN) 5 MG tablet Take 5 mg by mouth 1 day or 1 dose.   . pantoprazole (PROTONIX) 40 MG tablet Take 1 tablet by mouth 2 (two) times daily.   . vitamin D, CHOLECALCIFEROL, 400 UNITS tablet Take 400 Units by mouth daily.   No facility-administered encounter medications on file as of 11/18/2017.   :  Review of Systems:  Out of a complete 14 point review of systems, all are reviewed and negative with the exception of these symptoms as listed below: Review of Systems  Neurological:       Pt presents today to discuss his gait. Pt denies any recent falls.    Objective:  Neurological Exam  Physical Exam Physical Examination:   Vitals:   11/18/17 1259  BP: 123/68  Pulse: (!) 107    General Examination: The patient is a very pleasant 77 y.o. male in no acute distress. He Thin, mildly frail but stable overall, well groomed.   HEENT: Normocephalic, atraumatic, pupils are equal, round and reactive to light and accommodation. Corrective eye glasses. R sided hearing aid in place. Extraocular tracking is good without limitation to gaze excursion or nystagmus noted. Hearing is mildly impaired. There is mild hypophonia. There is no tremor in the head and neck area or face. Airway examination reveals mild mouth dryness, otherwise unchanged findings.  Chest: Clear to auscultation without  wheezing, rhonchi or crackles noted.  Heart: S1+S2+0, regular and normal without murmurs, rubs or gallops noted.   Abdomen: Soft, non-tender and non-distended with normal bowel sounds appreciated.   Extremities: There is no pitting edema in the distal lower extremities bilaterally. Pedal pulses are intact.  Skin: Warm and dry without trophic changes noted.  Musculoskeletal: exam reveals no obvious joint deformities, tenderness or joint swelling or erythema.   Neurologically:  Mental status: The patient is awake, alert and oriented in all 4 spheres. His immediate and remote memory, attention, language skills and fund of knowledge are fairly appropriate. Mood is normal and affect is normal.   On 02/26/2015: MMSE: 26/30, he missed 1 point on serial sevens, and 2 points on orientation as well as one point on  remote recall. CDT: 4/4, AFT: 9/min.  On 08/29/2015: MMSE: 26/30, CDT: 4/4, AFT: 10/min.   On 02/26/2016: MMSE: 27/30, CDT: 4/4, AFT: 9/min.  Cranial nerves II - XII are as described above under HEENT exam. Motor exam: thinner bulk, particularly in the proximal leg muscles, strength is stable, mild proximal leg muscle weakness, no foot drop, Romberg is not testable safely. Reflexes are 1+ in the upper extremities and knees, absent in the ankles, also stable, fine motor skills are globally mildly impaired which is also stable. Sensory exam: intact to light touch.  Gait, station and balance: He stands up with mild difficulty and pushes himself up. He needs no assistance. He stands wide-based and slightly bowlegged, not pushing through completely with his knees, all stable. He walks slightly insecurely and wide-based, he uses his cane, tandem walk is not possible for him.  Assessment and Plan:    In summary, SHERON ROBIN is a very pleasant 77 year old male  with an underlying complex medical history of reflux disease, hypertension, chronic lung disease, vitamin D deficiency, vitamin  B12 deficiency, thyroid disease, type 2 diabetes, heart disease, status post MI and PTCA in 1987, status post CABG in 1997, paroxysmal atrial fibrillation on Eliquis, gout, BPH, kidney stone, diverticulosis, shingles in 2009, who presents for follow-up consultation of hisGait disorder. He was started recently on diltiazem for rate control. His pulse rate is a little higher. He is no longer on amiodarone, Coumadin, or verapamil. He is in regular follow-up with cardiology. From my end of things I suggested that he try to exercise his leg muscles, try to get back on track with regular exercise on a stationary bike, he is furthermore advised to increase his water intake and decrease his soda and tea intake. We talked about the importance of gait safety and his fall risk again today. He is advised to use his cane for gait safety. From my end of things I suggested as needed follow-up at this point. I answered all their questions today and the patient and his wife were in agreement. I spent 20 minutes in total face-to-face time with the patient, more than 50% of which was spent in counseling and coordination of care, reviewing test results, reviewing medication and discussing or reviewing the diagnosis of gait d/o, its prognosis and treatment options. Pertinent laboratory and imaging test results that were available during this visit with the patient were reviewed by me and considered in my medical decision making (see chart for details).

## 2017-11-18 NOTE — Patient Instructions (Addendum)
Please reduce your soda intake and drink more water, less tea.  Please use your cane for gait safety.  I do think you will benefit from exercising your leg muscles.  Please stay active mentally and physically.  I will see you back as needed at this point.

## 2017-12-15 ENCOUNTER — Ambulatory Visit: Payer: PPO | Admitting: Physician Assistant

## 2017-12-15 ENCOUNTER — Encounter: Payer: Self-pay | Admitting: Physician Assistant

## 2017-12-15 VITALS — BP 112/62 | HR 94 | Ht 72.0 in | Wt 183.0 lb

## 2017-12-15 DIAGNOSIS — R011 Cardiac murmur, unspecified: Secondary | ICD-10-CM

## 2017-12-15 DIAGNOSIS — I25810 Atherosclerosis of coronary artery bypass graft(s) without angina pectoris: Secondary | ICD-10-CM

## 2017-12-15 DIAGNOSIS — E039 Hypothyroidism, unspecified: Secondary | ICD-10-CM | POA: Diagnosis not present

## 2017-12-15 DIAGNOSIS — I482 Chronic atrial fibrillation, unspecified: Secondary | ICD-10-CM

## 2017-12-15 DIAGNOSIS — E785 Hyperlipidemia, unspecified: Secondary | ICD-10-CM | POA: Diagnosis not present

## 2017-12-15 DIAGNOSIS — E119 Type 2 diabetes mellitus without complications: Secondary | ICD-10-CM | POA: Diagnosis not present

## 2017-12-15 DIAGNOSIS — N183 Chronic kidney disease, stage 3 unspecified: Secondary | ICD-10-CM

## 2017-12-15 MED ORDER — DILTIAZEM HCL ER COATED BEADS 120 MG PO CP24: 120.0000 mg | ORAL_CAPSULE | Freq: Every day | ORAL | 3 refills | Status: DC

## 2017-12-15 NOTE — Progress Notes (Signed)
Cardiology Office Note    Date:  12/15/2017   ID:  Timothy Mclean, DOB Oct 28, 1940, MRN [id number]  PCP:  Derinda Late, MD  Cardiologist:  Dr. Ellyn Hack   Chief Complaint  Patient presents with  . Follow-up    seen for Dr. Ellyn Hack.     History of Present Illness:  NIL Timothy Mclean is a 77 y.o. male with PMH of CAD s/p CABG x 4 1997 (PTCA-stent of LCx 1987), persistent atrial fibrillation, Barrett's esophagus, anxiety, hyperlipidemia, hypothyroidism, CKD stage III, DM 2 and COPD.  He had a false positive Myoview in January 2005.  Cardiac catheterization performed on 08/09/2003 showed widely patent SVG to posterior descending artery, PDA was widely patent and patent SVG to OM, patent LIMA to LAD.  He is no longer on amiodarone and Coumadin for his atrial fibrillation, he was switched to Eliquis instead.  During the last office visit, his heart rate was borderline high, diltiazem 120 mg daily was added for additional rate control.  Patient presents today for cardiology office visit, he denies any recent chest discomfort shortness of breath.  He has been doing well since the last office visit on the diltiazem.  His blood pressure and heart rate is fairly controlled on the current dose of diltiazem.  Heart rate mainly in the 80s and low 90s.  He continued to walk with a cane.  During the last office visit, Dr. Ellyn Hack was concerned about a heart murmur and recommended outpatient echocardiogram.  This can be done prior to his next visit with Dr. Ellyn Hack in 5 to 6 months.   Past Medical History:  Diagnosis Date  . Allergy   . Anxiety   . Asthma    since 2013  . Barrett's esophagus 03/09/2014  . Bilateral lumbar radiculopathy    since 2013  . CAD S/P percutaneous coronary angioplasty 1987   a. 1987 angioplasty and BMS- Cx; b. 1997 CABG x 4;  c. MYOVIEW 1/'05: ? Ischemia vs. artifact --> sent for cath - patent grafts.  . Chronic atrial fibrillation ([place])    a. Prev on amiodarone-->d/c 08/2016 in  setting of recurrent AF, LFT abnormalities, and pulmonary symptoms;  b. CHA2DS2VASc = 4-->eliquis.  . Chronic bronchitis ([place])    since 2007  . COPD (chronic obstructive pulmonary disease) ([place])   . DDD (degenerative disc disease)   . Diverticulosis   . Dyslipidemia, goal LDL below 70    Doing better off of Zocor, currently on Lescol   . ED (erectile dysfunction)   . Elevated LFTs   . Esophageal reflux   . GERD (gastroesophageal reflux disease)   . Gout, unspecified   . Hearing loss   . Helicobacter pylori gastritis 07/2009  . Herpes zoster 2009  . Hypothyroidism    since 2010  . Iron deficiency anemia, unspecified   . Leg weakness    a. 09/2016 ABI's: R 1.05, L 0.98.  [name] Other and unspecified hyperlipidemia   . Other B-complex deficiencies   . Personal history of colonic polyps   . Prostatitis    chronic ulcerative  . Pulmonary fibrosis ([place])    and bronchiectasis, since 2015  . Renal stone    Right  . S/P CABG x 4 1997   a) LIMA-LAD, SVG to OM, SVG-dRCA-RPL; b) FALSE + MYOVIEW --> CATH 1/'05: 100% LAD after widelly patent D1, all Cx OM branches 100%, pRCA 100%, SVG-PDA patent w/ retrograde filling of RPL, SVG-OM widely patent ~ normal OM, LIMA-LAD patent.  [name]  Spinal stenosis   . ST elevation myocardial infarction (STEMI) of inferior wall, subsequent episode of care ([place]) 1987, 1997   a) PTCA of Cx; b) CABG   . Type II or unspecified type diabetes mellitus without mention of complication, not stated as uncontrolled    no meds  . Ulcerative colitis, unspecified   . Unspecified essential hypertension   . Vitamin D deficiency     Past Surgical History:  Procedure Laterality Date  . BLEPHAROPLASTY Bilateral    for ptosis  . CARDIAC CATHETERIZATION  08/17/2003   "False positive stress test " grafts patent; RCA proximal 100% LAD 100% occlusion after normal D1 with 80% ostial as SP1; circumflex patent but OM1 on to all occluded; EF 50-55%  . CARDIAC CATHETERIZATION  11/1995    Preop CABG: LAD 90% at D1, circumflex 100 and OM a 90% proximal, 80% distal  . Carotid Duplex Doppler  12/23/2009   Right&Left ICAs 0-49%, mildly abnormal study,   . CHOLECYSTECTOMY    . COLONOSCOPY    . CORONARY ARTERY BYPASS GRAFT  11/1995   LIMA-LAD, SVG to OM, SVG-dRCA-RPL  . ESOPHAGOGASTRODUODENOSCOPY    . FLEXIBLE SIGMOIDOSCOPY    . NM MYOCAR PERF EJECTION FRACTION  07/03/2003   FALSE POSITIVE TEST; Bruce protocol, negative test with scintigraphic evidence of inferoapical scar, diaphragmatic attenuation, moderate ischemia.  [name] NM MYOVIEW LTD  2005   Questionable ischemia that is likely either infarct versus artifact; normal  . TRANSTHORACIC ECHOCARDIOGRAM  12/03/2008   LVEF =>55% normal study    Current Medications: Outpatient Medications Prior to Visit  Medication Sig Dispense Refill  . ADVAIR DISKUS 250-50 MCG/DOSE AEPB Inhale 1 puff into the lungs 2 (two) times daily.     [name] albuterol (PROVENTIL HFA;VENTOLIN HFA) 108 (90 Base) MCG/ACT inhaler Inhale into the lungs every 6 (six) hours as needed for wheezing or shortness of breath.    [name] apixaban (ELIQUIS) 5 MG TABS tablet Take 1 tablet (5 mg total) by mouth 2 (two) times daily. 28 tablet 0  . balsalazide (COLAZAL) 750 MG capsule TAKE 2 CAPSULES BY MOUTH TWICE A DAY 360 capsule 3  . BAYER CONTOUR TEST test strip   2  . CRESTOR 20 MG tablet Take 1 tablet by mouth daily.    . cyanocobalamin 2000 MCG tablet Take 2,000 mcg by mouth daily.    . finasteride (PROSCAR) 5 MG tablet Take 1 tablet by mouth daily.    [name] JANUVIA 50 MG tablet Take 50 mg by mouth daily.    [name] levothyroxine (SYNTHROID, LEVOTHROID) 75 MCG tablet Take 1 tablet by mouth daily. Take 1 tab daily    . metFORMIN (GLUCOPHAGE) 500 MG tablet Take 500 mg by mouth daily.     [name] oxybutynin (DITROPAN) 5 MG tablet Take 5 mg by mouth 1 day or 1 dose.     . pantoprazole (PROTONIX) 40 MG tablet Take 1 tablet by mouth 2 (two) times daily.     . vitamin D, CHOLECALCIFEROL, 400 UNITS  tablet Take 400 Units by mouth daily.    [name] diltiazem (CARDIZEM CD) 120 MG 24 hr capsule Take 1 capsule (120 mg total) by mouth daily. 90 capsule 3   No facility-administered medications prior to visit.      Allergies:   Atorvastatin; Fluvastatin; Lisinopril; Metformin and related; Pravastatin; Simvastatin; and Spiriva handihaler [tiotropium bromide monohydrate]   Social History   Socioeconomic History  . Marital status: Married    Spouse name: Not on file  .  Number of children: 1  . Years of education: Not on file  . Highest education level: Not on file  Occupational History  . Occupation: retired  Scientific laboratory technician  . Financial resource strain: Not on file  . Food insecurity:    Worry: Not on file    Inability: Not on file  . Transportation needs:    Medical: Not on file    Non-medical: Not on file  Tobacco Use  . Smoking status: Former Smoker    Packs/day: 1.00    Years: 32.00    Pack years: 32.00    Types: Cigarettes    Last attempt to quit: 08/27/1984    Years since quitting: 33.3  . Smokeless tobacco: Never Used  Substance and Sexual Activity  . Alcohol use: No  . Drug use: No  . Sexual activity: Not on file  Lifestyle  . Physical activity:    Days per week: Not on file    Minutes per session: Not on file  . Stress: Not on file  Relationships  . Social connections:    Talks on phone: Not on file    Gets together: Not on file    Attends religious service: Not on file    Active member of club or organization: Not on file    Attends meetings of clubs or organizations: Not on file    Relationship status: Not on file  Other Topics Concern  . Not on file  Social History Narrative   He is a married father of one. Does not smoke and does not drink. He walks routinely and also does stationary bike. 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 Currently retired Librarian, academic)     Family History:  The  patient's family history includes CVA in his sister; Congestive Heart Failure in his mother; Dementia in his father; Diabetes in his mother and sister; Heart disease in his sister; Hypertension in his sister; Lung cancer in his brother; Pneumonia in his father.   ROS:   Please see the history of present illness.    ROS All other systems reviewed and are negative.   PHYSICAL EXAM:   VS:  BP 112/62   Pulse 94   Ht 6' (1.829 m)   Wt 183 lb (83 kg)   BMI 24.82 kg/m    GEN: Well nourished, well developed, in no acute distress  HEENT: normal  Neck: no JVD, carotid bruits, or masses Cardiac: Irregularly irregular; no murmurs, rubs, or gallops,no edema  Respiratory:  clear to auscultation bilaterally, normal work of breathing GI: soft, nontender, nondistended, + BS MS: no deformity or atrophy  Skin: warm and dry, no rash Neuro:  Alert and Oriented x 3, Strength and sensation are intact Psych: euthymic mood, full affect  Wt Readings from Last 3 Encounters:  12/15/17 183 lb (83 kg)  11/18/17 182 lb (82.6 kg)  11/01/17 183 lb 3.2 oz (83.1 kg)      Studies/Labs Reviewed:   EKG:  EKG is not ordered today.    Recent Labs: No results found for requested labs within last 8760 hours.   Lipid Panel No results found for: CHOL, TRIG, HDL, CHOLHDL, VLDL, LDLCALC, LDLDIRECT  Additional studies/ records that were reviewed today include:   Cath 08/18/2003 RESULTS:  1. Hemodynamic monitoring:  Central aortic pressure is 158/85, left     ventricular pressure is 164/11 with no significant gradient noted at the     time of  pullback.  2. Ventriculography:  In the RAO projection using 25 mL at 12 mL per second     revealed normal left ventricular systolic function with an ejection     fraction of 50% to 55%. The end diastolic pressure was normal at 13.  3. Distal aortogram:  A distal aortogram was performed because there was     hangup of the J-wire as it passed through the bifurcation of the  aorta.     Then 30 mL of contrast at 15 mL were injected just above the level of the     renal arteries. The renal arteries showed no evidence of renal artery     stenosis. There was mild irregularities of the terminal aorta above the     bifurcation and there was mild calcification at the bifurcation, but no     occlusive plaques were noted. The iliacs were widely patent.  4. Coronary arteriography:     A. Left main normal.     B. Left anterior descending artery:  The LAD was 100% occluded after the        1st diagonal. The 1st diagonal was widely patent and the proximal        portion of the LAD which led to the 1st diagonal was also widely        patent. There was a septal perforator that came off the proximal        portion of the LAD which had an 80% area of proximal narrowing. This        vessel is not amenable to any type intervention.     C. Circumflex:  The circumflex itself was patent, but all the OM vessels        were occluded.     D. Right coronary artery:  The right coronary artery was 100% occluded        proximally.   GRAFTS:  1. Saphenous vein to the posterior descending artery:  The graft  was widely     patent. The PDA was widely patent and there was retrograde filling of the     PDA into the posterolateral vessel. This was all free of disease.  2. Saphenous vein graft to the obtuse marginal:  The graft was widely     patent. The OM itself was small  and free of disease.  3. Internal mammary artery to the left anterior descending artery:  The     internal mammary artery was widely patent. The LAD from the midportion     down around the apex of the heart  was widely patent.   CONCLUSION:  1. Widely patent bypass grafts.  2. Normal left ventricular systolic function at 58% to 55%.  3. Minimal irregularities in the terminal aorta.   ASSESSMENT:    1. Chronic atrial fibrillation ([place])   2. Heart murmur   3. Coronary artery disease involving coronary bypass  graft of native heart without angina pectoris   4. CKD (chronic kidney disease), stage III ([place])   5. Hyperlipidemia, unspecified hyperlipidemia type   6. Hypothyroidism, unspecified type   7. Controlled type 2 diabetes mellitus without complication, without long-term current use of insulin (H[facility])      PLAN:  In order of problems listed above:  1. Chronic atrial fibrillation: Heart rate well controlled on diltiazem 120 mg daily.  Blood pressure is also controlled now.  Continue on Eliquis and diltiazem  2. History of heart murmur: Heart murmur was appreciated by  Dr. Ellyn Hack during the last office visit, he will need an echocardiogram prior to his next office visit.  3. CAD s/p CABG: Denies any recent chest discomfort or shortness of breath.  Not on aspirin given the need for Eliquis.  On Crestor 20 mg daily  4. Hyperlipidemia: On Crestor 20 mg daily.  HDL borderline low, however well-controlled LDL, total cholesterol and triglyceride  5. Hypothyroidism: Managed by primary care provider  6. DM2: Managed by primary care provider    Medication Adjustments/Labs and Tests Ordered: Current medicines are reviewed at length with the patient today.  Concerns regarding medicines are outlined above.  Medication changes, Labs and Tests ordered today are listed in the Patient Instructions below. Patient Instructions  Medication Instructions:  Your physician recommends that you continue on your current medications as directed. Please refer to the Current Medication list given to you today.  Labwork: None    Testing/Procedures: Your physician has requested that you have an echocardiogram. Echocardiography is a painless test that uses sound waves to create images of your heart. It provides your doctor with information about the size and shape of your heart and how well your heart's chambers and valves are working. This procedure takes approximately one hour. There are no restrictions for this  procedure. [place]  Follow-Up: Your physician recommends that you schedule a follow-up appointment in: 5-6 months with Dr Ellyn Hack   Any Other Special Instructions Will Be Listed Below (If Applicable).   If you need a refill on your cardiac medications before your next appointment, please call your pharmacy.     Hilbert Corrigan, [place]  12/15/2017 4:36 PM    [facility] [address], [place], [place]  [address] Phone: [phone]; Fax: [phone]

## 2017-12-15 NOTE — Patient Instructions (Addendum)
Medication Instructions:  Your physician recommends that you continue on your current medications as directed. Please refer to the Current Medication list given to you today.  Labwork: None    Testing/Procedures: Your physician has requested that you have an echocardiogram. Echocardiography is a painless test that uses sound waves to create images of your heart. It provides your doctor with information about the size and shape of your heart and how well your heart's chambers and valves are working. This procedure takes approximately one hour. There are no restrictions for this procedure. [place]  Follow-Up: Your physician recommends that you schedule a follow-up appointment in: 5-6 months with Dr Ellyn Hack   Any Other Special Instructions Will Be Listed Below (If Applicable).   If you need a refill on your cardiac medications before your next appointment, please call your pharmacy.

## 2017-12-18 HISTORY — PX: TRANSTHORACIC ECHOCARDIOGRAM: SHX275

## 2017-12-23 ENCOUNTER — Other Ambulatory Visit: Payer: Self-pay

## 2017-12-23 ENCOUNTER — Ambulatory Visit (HOSPITAL_COMMUNITY): Payer: PPO | Attending: Cardiology

## 2017-12-23 DIAGNOSIS — I4891 Unspecified atrial fibrillation: Secondary | ICD-10-CM | POA: Insufficient documentation

## 2017-12-23 DIAGNOSIS — E785 Hyperlipidemia, unspecified: Secondary | ICD-10-CM | POA: Diagnosis not present

## 2017-12-23 DIAGNOSIS — I081 Rheumatic disorders of both mitral and tricuspid valves: Secondary | ICD-10-CM | POA: Insufficient documentation

## 2017-12-23 DIAGNOSIS — I2510 Atherosclerotic heart disease of native coronary artery without angina pectoris: Secondary | ICD-10-CM | POA: Diagnosis not present

## 2017-12-23 DIAGNOSIS — Z951 Presence of aortocoronary bypass graft: Secondary | ICD-10-CM | POA: Diagnosis not present

## 2017-12-23 DIAGNOSIS — E119 Type 2 diabetes mellitus without complications: Secondary | ICD-10-CM | POA: Insufficient documentation

## 2017-12-23 DIAGNOSIS — J449 Chronic obstructive pulmonary disease, unspecified: Secondary | ICD-10-CM | POA: Diagnosis not present

## 2017-12-23 DIAGNOSIS — R011 Cardiac murmur, unspecified: Secondary | ICD-10-CM | POA: Diagnosis not present

## 2017-12-23 MED ORDER — PERFLUTREN LIPID MICROSPHERE
1.0000 mL | INTRAVENOUS | Status: AC | PRN
Start: 2017-12-23 — End: 2017-12-23
  Administered 2017-12-23: 2 mL via INTRAVENOUS

## 2017-12-24 NOTE — Progress Notes (Signed)
Normal pumping function, mild mitral valve leakage likely responsible for the heart murmur. The degree of leakage is very mild and does not warrant any treatment.

## 2017-12-27 ENCOUNTER — Telehealth: Payer: Self-pay | Admitting: Cardiology

## 2017-12-27 NOTE — Telephone Encounter (Signed)
New message    Patient returning call back to nurse  - test results

## 2017-12-27 NOTE — Telephone Encounter (Signed)
Patient's wife made aware of results and verbalized her understanding (Per Dpr)  Notes recorded by Almyra Deforest, PA on 12/24/2017 at 9:11 AM EDT Normal pumping function, mild mitral valve leakage likely responsible for the heart murmur. The degree of leakage is very mild and does not warrant any treatment

## 2018-01-04 DIAGNOSIS — E119 Type 2 diabetes mellitus without complications: Secondary | ICD-10-CM | POA: Diagnosis not present

## 2018-01-04 DIAGNOSIS — I481 Persistent atrial fibrillation: Secondary | ICD-10-CM | POA: Diagnosis not present

## 2018-04-08 DIAGNOSIS — E119 Type 2 diabetes mellitus without complications: Secondary | ICD-10-CM | POA: Diagnosis not present

## 2018-04-08 DIAGNOSIS — I481 Persistent atrial fibrillation: Secondary | ICD-10-CM | POA: Diagnosis not present

## 2018-04-18 DIAGNOSIS — H25813 Combined forms of age-related cataract, bilateral: Secondary | ICD-10-CM | POA: Diagnosis not present

## 2018-04-18 DIAGNOSIS — E119 Type 2 diabetes mellitus without complications: Secondary | ICD-10-CM | POA: Diagnosis not present

## 2018-04-18 DIAGNOSIS — H04123 Dry eye syndrome of bilateral lacrimal glands: Secondary | ICD-10-CM | POA: Diagnosis not present

## 2018-05-15 IMAGING — MR MR ABDOMEN WO/W CM
12 of 18 series · 30 of 48 positions shown · IV contrast (multihance)
Comparison: 08/18/2016 CT abdomen/ pelvis.

CLINICAL DATA: 75-year-old male with indeterminate pancreatic head
abnormality on recent CT study performed for abdominal pain,
fluctuating appetite, weight loss and elevated liver function tests.

EXAM:
MRI ABDOMEN WITHOUT AND WITH CONTRAST
TECHNIQUE: Multiplanar multisequence MR imaging of the abdomen was performed
both before and after the administration of intravenous contrast.
CONTRAST:  16mL MULTIHANCE GADOBENATE DIMEGLUMINE 529 MG/ML IV SOLN

[Series 3: T2 · coronal · 5.0mm · 1.56mm/px · 1 of 44 slices shown (1 of 3)]
[im 1/44]
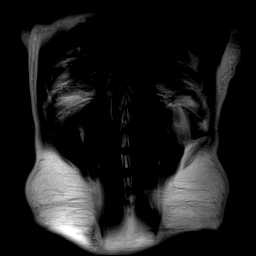

[Series 4: T1 · axial · 3.0mm · 1.25mm/px · z∈[-110,+127]mm · 5 of 160 slices shown]
[im 1/160]
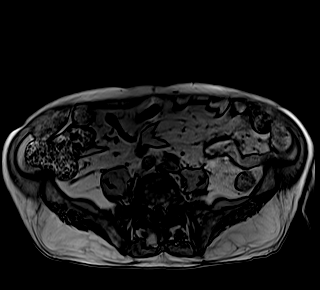
[im 40/160]
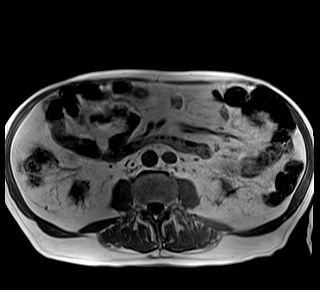
[im 80/160]
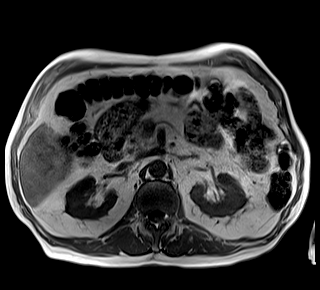
[im 120/160]
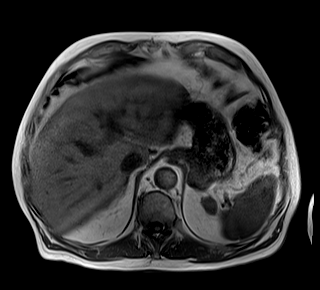
[im 160/160]
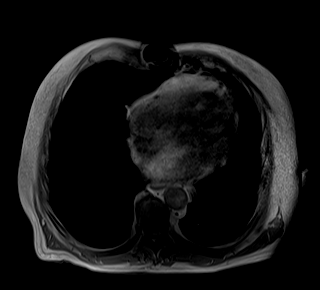

[Series 5: T2 · axial · 5.0mm · 1.56mm/px · 1 of 43 slices shown (2 of 3)]
[im 1/43]
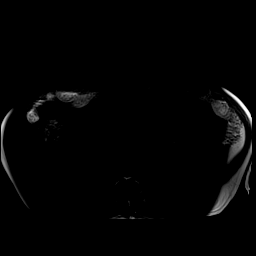

[Series 6: DWI · axial · 5.0mm · 1.49mm/px · z∈[-91,+161]mm · 4 of 129 slices shown (1 of 2)]
[im 1/129]
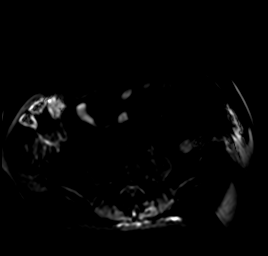
[im 43/129]
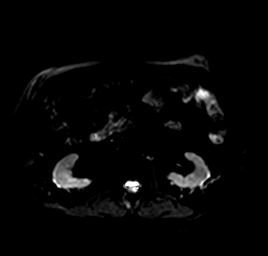
[im 86/129]
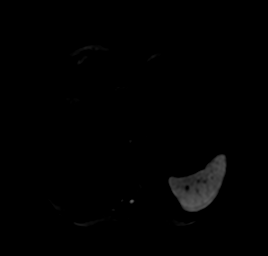
[im 129/129]
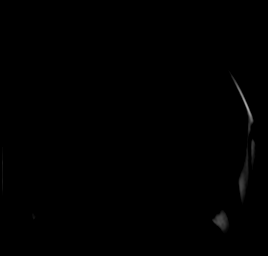

[Series 7: DWI · axial · 5.0mm · 1.49mm/px · 1 of 43 slices shown (2 of 2)]
[im 1/43]
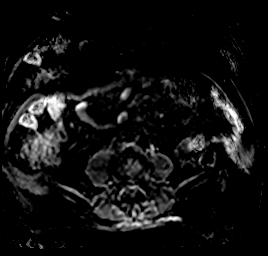

[Series 8: T2 · axial · 6.0mm · 1.25mm/px · 1 of 30 slices shown (3 of 3)]
[im 1/30]
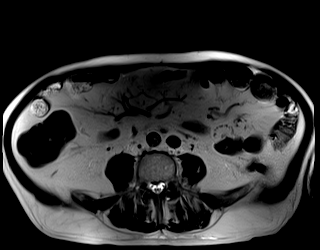

[Series 9: bSSFP · axial · 5.0mm · 1.25mm/px · 1 of 38 slices shown]
[im 1/38]
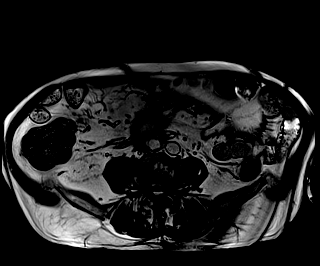

[Series 11: MRCP · coronal · 1.0mm · 0.49mm/px · 4 of 96 slices shown]
[im 1/96]
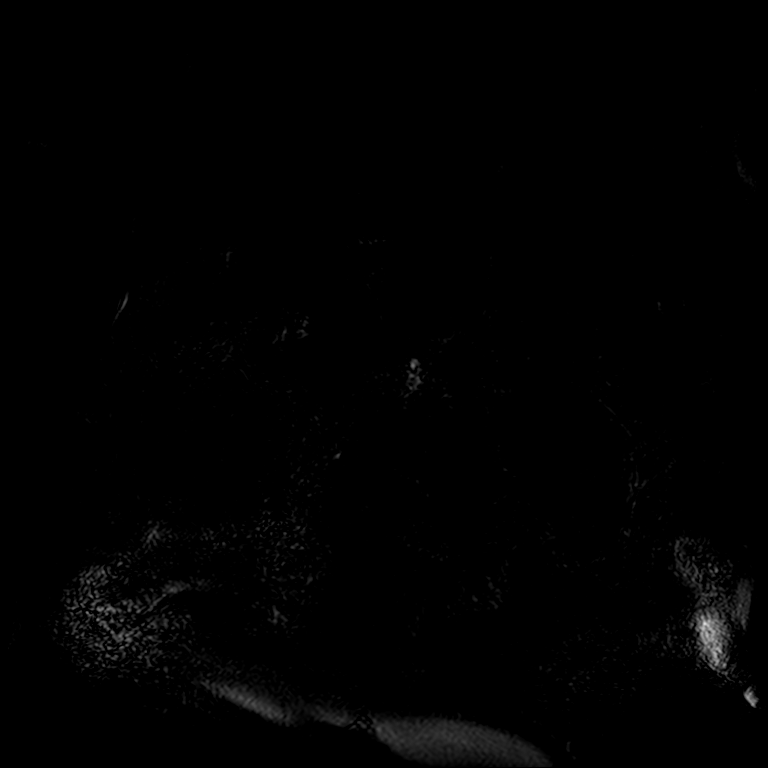
[im 32/96]
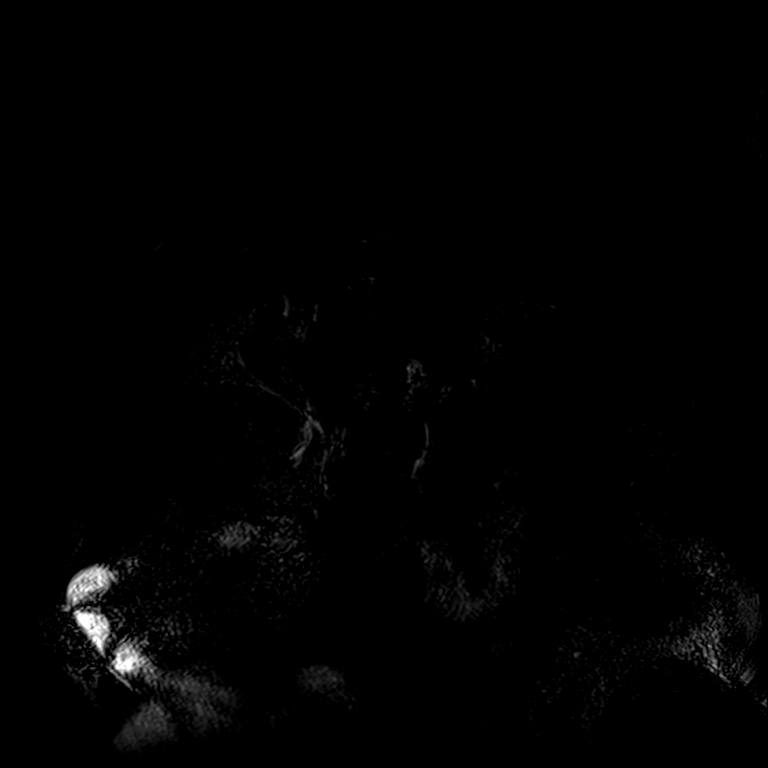
[im 64/96]
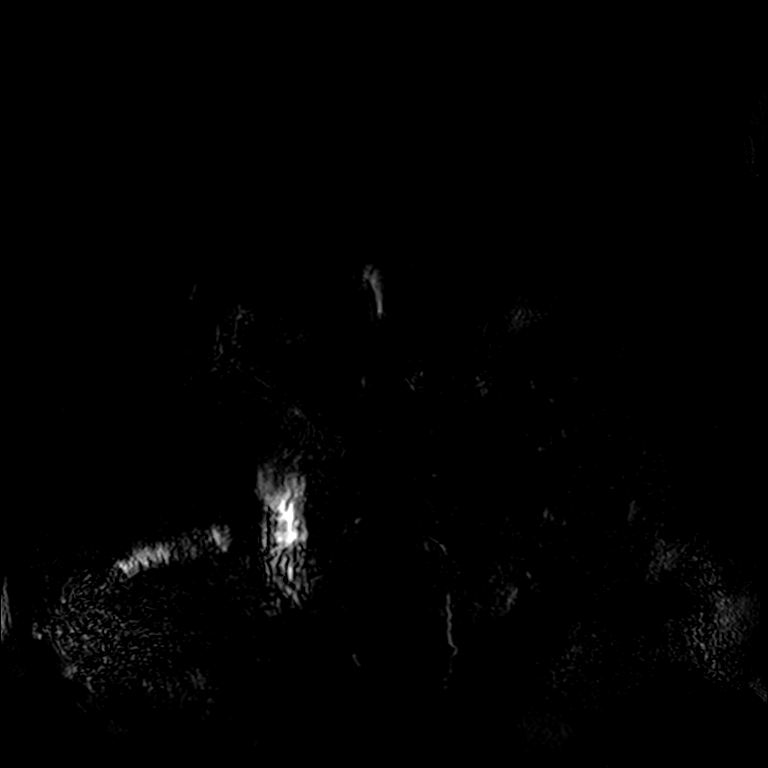
[im 96/96]
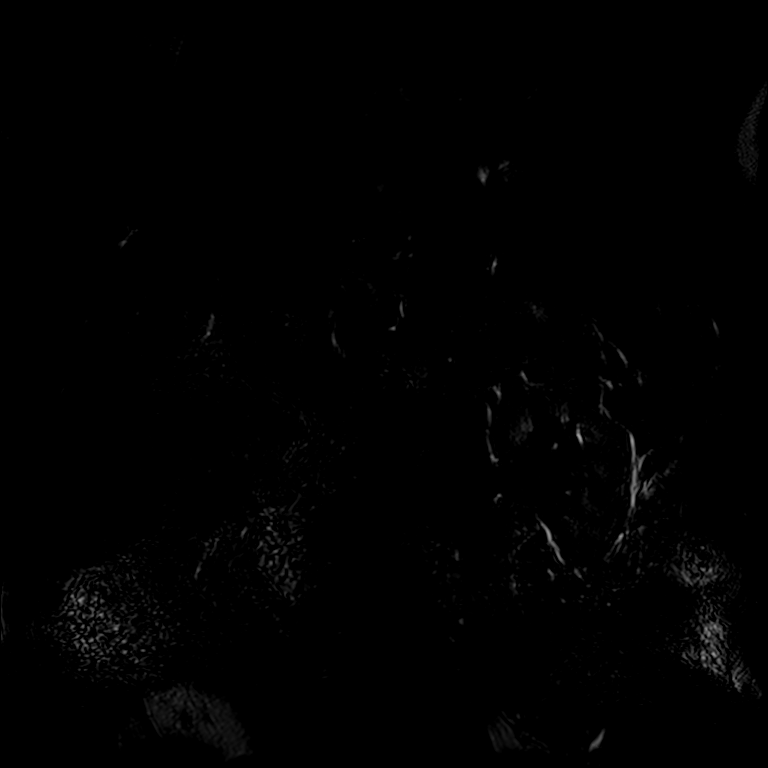

[Series 13: T1 dynamic · axial · non-contrast · 3.0mm · 1.25mm/px · z∈[-83,+130]mm · 3 of 72 slices shown]
[im 1/72]
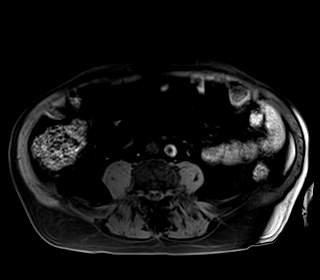
[im 36/72]
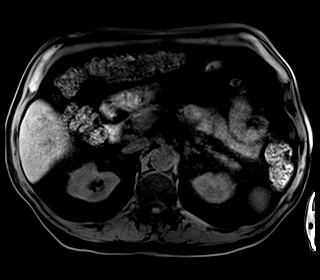
[im 72/72]
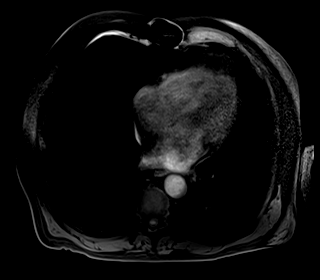

[Series 14: T1 dynamic post-contrast · axial · 3.0mm · 1.25mm/px · z∈[-83,+130]mm · 3 of 72 slices shown (1 of 3)]
[im 1/72]
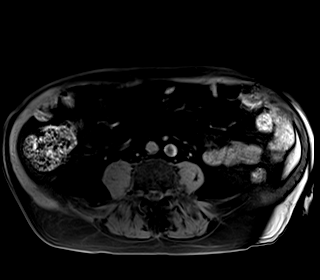
[im 36/72]
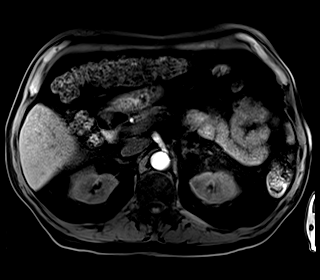
[im 72/72]
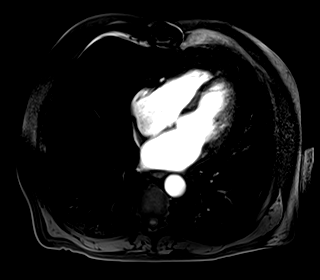

[Series 15: T1 dynamic post-contrast · axial · 3.0mm · 1.25mm/px · z∈[-83,+130]mm · 3 of 72 slices shown (2 of 3)]
[im 1/72]
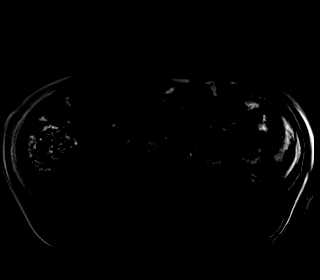
[im 36/72]
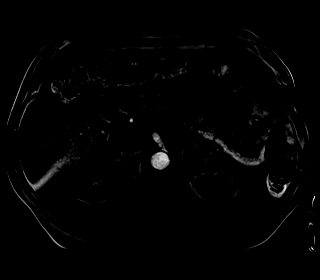
[im 72/72]
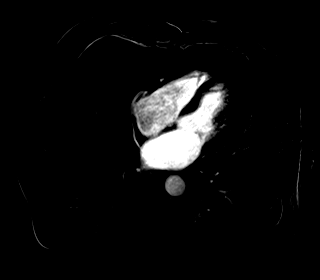

[Series 16: T1 dynamic post-contrast · axial · 3.0mm · 1.25mm/px · z∈[-83,+130]mm · 3 of 72 slices shown (3 of 3)]
[im 1/72]
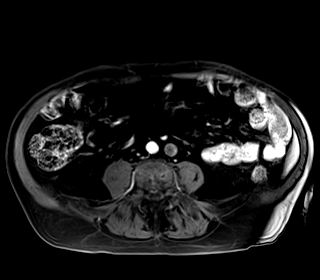
[im 36/72]
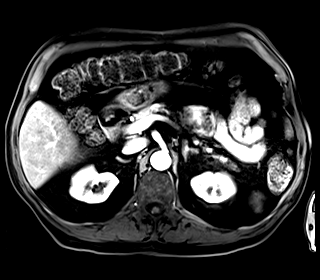
[im 72/72]
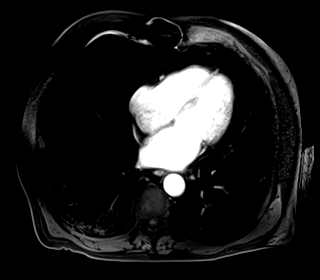

[30 of 48 positions shown; findings below may reference images not displayed]

FINDINGS: Lower chest: Partially visualized changes from prior median
sternotomy. Patchy reticular opacities in the dependent lung bases
bilaterally are not appreciably changed back to the 09/21/2012 chest
CT.

Hepatobiliary: Normal liver size and configuration. No hepatic
steatosis. Two simple subcentimeter left liver lobe cysts. No
additional liver lesions. Cholecystectomy. No biliary ductal
dilatation. Common bile duct diameter 2 mm. No choledocholithiasis.
No biliary strictures. No biliary or ampullary mass.

Pancreas: There is a 1.0 x 0.8 x 0.6 cm unilocular lobulated cystic
pancreatic lesion in the pancreatic body (series 5/ image 21),
without appreciable enhancement, wall thickening or internal
septations. No additional pancreatic lesions. The region of concern
described in the uncinate process/ pancreatic head on the 08/18/2016
CT abdomen/ pelvis report correlates with a region of variable fatty
infiltration of the pancreas as best depicted on the out of phase
chemical shift series 4/ images 49-51. There is a small duodenal
diverticulum arising anteriorly from the distal duodenum measuring
1.4 x 1.0 cm (series 4/ image 57), located between the SMV, duodenum
and uncinate process, which is filled with proteinaceous debris as
indicated by the precontrast T1 hyperintensity and absence of
enhancement. No main pancreatic duct dilation. No pancreas divisum.

Spleen: Normal size. No mass.

Adrenals/Urinary Tract: Normal adrenals. No hydronephrosis. Simple
1.4 cm renal cyst in the lateral lower right kidney. Additional
subcentimeter simple renal cysts in both kidneys. No suspicious
renal masses.

Stomach/Bowel: Grossly normal stomach. Visualized small and large
bowel is normal caliber, with no bowel wall thickening.

Vascular/Lymphatic: Atherosclerotic nonaneurysmal abdominal aorta.
Patent portal, splenic, hepatic and renal veins. Left-sided IVC
below the level of the renal veins. No pathologically enlarged lymph
nodes in the abdomen.

Other: No abdominal ascites or focal fluid collection.

Musculoskeletal: No aggressive appearing focal osseous lesions.
IMPRESSION: 1. No pancreatic head mass. The area of concern in the pancreatic
head described on the 08/18/2016 CT abdomen/ pelvis report
correlates with a region of variable fatty infiltration, also noting
an adjacent small anterior distal duodenal diverticulum abutting the
uncinate process.
2. Unilocular nonenhancing 1.0 cm cystic pancreatic lesion in the
pancreatic body without high risk features. No main pancreatic duct
dilation. Follow-up pancreas protocol MRI abdomen without and with
IV contrast is recommended in 2 years. This recommendation follows
ACR consensus guidelines: Management of Incidental Pancreatic Cysts:
A White Paper of the ACR Incidental Findings Committee. [HOSPITAL] 2755;[DATE].
3. Tiny benign liver and renal cysts. No suspicious liver masses. No
hepatic steatosis.
4. No biliary ductal dilatation status post cholecystectomy.
5. Aortic atherosclerosis .
6. Left-sided IVC.

## 2018-05-18 ENCOUNTER — Ambulatory Visit: Payer: PPO | Admitting: Cardiology

## 2018-05-18 ENCOUNTER — Encounter: Payer: Self-pay | Admitting: Cardiology

## 2018-05-18 VITALS — BP 122/62 | HR 100 | Ht 72.0 in | Wt 181.8 lb

## 2018-05-18 DIAGNOSIS — Z7901 Long term (current) use of anticoagulants: Secondary | ICD-10-CM

## 2018-05-18 DIAGNOSIS — I2510 Atherosclerotic heart disease of native coronary artery without angina pectoris: Secondary | ICD-10-CM

## 2018-05-18 DIAGNOSIS — I10 Essential (primary) hypertension: Secondary | ICD-10-CM | POA: Diagnosis not present

## 2018-05-18 DIAGNOSIS — I4811 Longstanding persistent atrial fibrillation: Secondary | ICD-10-CM

## 2018-05-18 DIAGNOSIS — E785 Hyperlipidemia, unspecified: Secondary | ICD-10-CM | POA: Diagnosis not present

## 2018-05-18 DIAGNOSIS — Z9229 Personal history of other drug therapy: Secondary | ICD-10-CM | POA: Diagnosis not present

## 2018-05-18 MED ORDER — DILTIAZEM HCL ER COATED BEADS 180 MG PO CP24: 180.0000 mg | ORAL_CAPSULE | Freq: Every day | ORAL | 3 refills | Status: DC

## 2018-05-18 NOTE — Patient Instructions (Signed)
Medication Instructions:  -- increase Diltiazem 180 mg  by mouth daily If you need a refill on your cardiac medications before your next appointment, please call your pharmacy.   Lab work: Not needed If you have labs (blood work) drawn today and your tests are completely normal, you will receive your results only by: [name] MyChart Message (if you have MyChart) OR . A paper copy in the mail If you have any lab test that is abnormal or we need to change your treatment, we will call you to review the results.  Testing/Procedures: Not needed  Follow-Up: At [facility] you and your health needs are our priority.  As part of our continuing mission to provide you with exceptional heart care, we have created designated Provider Care Teams.  These Care Teams include your primary Cardiologist (physician) and Advanced Practice Providers (APPs -  Physician Assistants and Nurse Practitioners) who all work together to provide you with the care you need, when you need it. You will need a follow up appointment in 6 months.  Please call our office 2 months in advance to schedule this appointment.  You may see Glenetta Hew, MD or one of the following Advanced Practice Providers on your designated Care Team:   Rosaria Ferries, PA-C . Jory Sims, DNP, ANP  Any Other Special Instructions Will Be Listed Below (If Applicable).

## 2018-05-18 NOTE — Progress Notes (Signed)
PCP: Derinda Late, MD  Clinic Note: Chief Complaint  Patient presents with  . Follow-up    No major complaints  . Coronary Artery Disease  . Atrial Fibrillation    Chronic persistent    HPI: Timothy Mclean is a 77 y.o. male with a PMH notable for CAD-CABG as well as persistent/paroxysmal atrial fibrillation on Eliquis & diltiazem (formerly on Amiodarone). He is here today for ~5  Month follow-up   CAD: History of CABGx4 in 1997 (after PTCA-stent of LCx in1987)with false positive Myoview in January 2005. No further testing since then. He is no longer on amiodarone and Eliquis for his A. Fib. -Was converted to Eliquis, and is not on amiodarone anymore. He also has hypertension hyperlipidemia and diabetes. Complicated by COPD.  Timothy Mclean was last seen by me in April  --I adjusted his medications (diltiazem).  He was then seen by Timothy Deforest, PA on May 29.  Echocardiogram ordered  Recent Hospitalizations: n/a  Studies Personally Reviewed - (if available, images/films reviewed: From Epic Chart or Care Everywhere)  2D Echo December 23, 2017: EF 55-60%.  Unable to assess diastolic function due to A. fib.  Moderate LA/RA dilation.  Mild MR.  Interval History: Timothy Mclean returns today overall doing okay.  He really still has not noticed being in A. fib at all.  He rarely is able to tell his heart rate going up too much faster than about 100 beats a minute.  He does not notice it being 100 beats a minute right now, but did say that they check his heart rate right when he walked in.  Usually it gets up into the 100s when he starts walking. Despite having somewhat faster heart rate than normal, he does not really feel it does not notice any chest tightness or pressure with rest or exertion.  A little bit of exertional dyspnea if he overexerts, but not with routine activity. No PND, orthopnea with minimal edema.  No syncope/near syncope or TIA/amaurosis fugax.  No claudication symptoms.  Just some mild  occasional cramping.  He feels much better with much less bruising on Eliquis as opposed to warfarin.  His PCP recently check labs, but not available.  ROS: A comprehensive was performed. Review of Systems  Constitutional: Negative for malaise/fatigue (Not fatigue, just feels "lazy ").  HENT: Negative for congestion.   Respiratory: Negative for shortness of breath.   Cardiovascular: Positive for leg swelling (Trivial).  Gastrointestinal: Positive for constipation.  Musculoskeletal: Positive for joint pain. Negative for falls and myalgias.  Neurological: Positive for dizziness (just poor balance -will occasionally use a walker.). Negative for weakness.  Psychiatric/Behavioral: Negative for depression and memory loss. The patient does not have insomnia.   All other systems reviewed and are negative.  I have reviewed and (if needed) personally updated the patient's problem list, medications, allergies, past medical and surgical history, social and family history.   Past Medical History:  Diagnosis Date  . Allergy   . Anxiety   . Asthma    since 2013  . Barrett's esophagus 03/09/2014  . Bilateral lumbar radiculopathy    since 2013  . CAD S/P percutaneous coronary angioplasty 1987   a. 1987 angioplasty and BMS- Cx; b. 1997 CABG x 4;  c. MYOVIEW 1/'05: ? Ischemia vs. artifact --> sent for cath - patent grafts.  . Chronic atrial fibrillation    a. Prev on amiodarone-->d/c 08/2016 in setting of recurrent AF, LFT abnormalities, and pulmonary symptoms;  b.  CHA2DS2VASc = 4-->eliquis.  . Chronic bronchitis ([place])    since 2007  . COPD (chronic obstructive pulmonary disease) ([place])   . DDD (degenerative disc disease)   . Diverticulosis   . Dyslipidemia, goal LDL below 70    Doing better off of Zocor, currently on Lescol   . ED (erectile dysfunction)   . Elevated LFTs   . Esophageal reflux   . GERD (gastroesophageal reflux disease)   . Gout, unspecified   . Hearing loss   . Helicobacter  pylori gastritis 07/2009  . Herpes zoster 2009  . Hypothyroidism    since 2010  . Iron deficiency anemia, unspecified   . Leg weakness    a. 09/2016 ABI's: R 1.05, L 0.98.  [name] Other and unspecified hyperlipidemia   . Other B-complex deficiencies   . Personal history of colonic polyps   . Prostatitis    chronic ulcerative  . Pulmonary fibrosis ([place])    and bronchiectasis, since 2015  . Renal stone    Right  . S/P CABG x 4 1997   a) LIMA-LAD, SVG to OM, SVG-dRCA-RPL; b) FALSE + MYOVIEW --> CATH 1/'05: 100% LAD after widelly patent D1, all Cx OM branches 100%, pRCA 100%, SVG-PDA patent w/ retrograde filling of RPL, SVG-OM widely patent ~ normal OM, LIMA-LAD patent.  [name] Spinal stenosis   . ST elevation myocardial infarction (STEMI) of inferior wall, subsequent episode of care ([place]) 1987, 1997   a) PTCA of Cx; b) CABG   . Type II or unspecified type diabetes mellitus without mention of complication, not stated as uncontrolled    no meds  . Ulcerative colitis, unspecified   . Unspecified essential hypertension   . Vitamin D deficiency     Past Surgical History:  Procedure Laterality Date  . BLEPHAROPLASTY Bilateral    for ptosis  . CARDIAC CATHETERIZATION  08/17/2003   "False positive stress test " grafts patent; RCA proximal 100% LAD 100% occlusion after normal D1 with 80% ostial as SP1; circumflex patent but OM1 on to all occluded; EF 50-55%  . CARDIAC CATHETERIZATION  11/1995   Preop CABG: LAD 90% at D1, circumflex 100 and OM a 90% proximal, 80% distal  . Carotid Duplex Doppler  12/23/2009   Right&Left ICAs 0-49%, mildly abnormal study,   . CHOLECYSTECTOMY    . COLONOSCOPY    . CORONARY ARTERY BYPASS GRAFT  11/1995   LIMA-LAD, SVG to OM, SVG-dRCA-RPL  . ESOPHAGOGASTRODUODENOSCOPY    . FLEXIBLE SIGMOIDOSCOPY    . NM MYOCAR PERF EJECTION FRACTION  07/03/2003   FALSE POSITIVE TEST; Bruce protocol, negative test with scintigraphic evidence of inferoapical scar, diaphragmatic  attenuation, moderate ischemia.  [name] NM MYOVIEW LTD  2005   Questionable ischemia that is likely either infarct versus artifact; normal  . TRANSTHORACIC ECHOCARDIOGRAM  12/2017   EF 55-60%.  Unable to assess diastolic function due to A. fib.  Moderate LA/RA dilation.  Mild MR.    Current Meds  Medication Sig  . ADVAIR DISKUS 250-50 MCG/DOSE AEPB Inhale 1 puff into the lungs 2 (two) times daily.   [name] albuterol (PROVENTIL HFA;VENTOLIN HFA) 108 (90 Base) MCG/ACT inhaler Inhale into the lungs every 6 (six) hours as needed for wheezing or shortness of breath.  [name] apixaban (ELIQUIS) 5 MG TABS tablet Take 1 tablet (5 mg total) by mouth 2 (two) times daily.  . balsalazide (COLAZAL) 750 MG capsule TAKE 2 CAPSULES BY MOUTH TWICE A DAY  . BAYER CONTOUR TEST test strip   .  CRESTOR 20 MG tablet Take 1 tablet by mouth daily.  . cyanocobalamin 2000 MCG tablet Take 2,000 mcg by mouth daily.  . finasteride (PROSCAR) 5 MG tablet Take 1 tablet by mouth daily.  [name] JANUVIA 50 MG tablet Take 50 mg by mouth daily.  [name] levothyroxine (SYNTHROID, LEVOTHROID) 75 MCG tablet Take 1 tablet by mouth daily. Take 1 tab daily  . metFORMIN (GLUCOPHAGE) 500 MG tablet Take 500 mg by mouth daily.   [name] oxybutynin (DITROPAN) 5 MG tablet Take 5 mg by mouth 1 day or 1 dose.   . pantoprazole (PROTONIX) 40 MG tablet Take 1 tablet by mouth 2 (two) times daily.   . vitamin D, CHOLECALCIFEROL, 400 UNITS tablet Take 400 Units by mouth daily.  . [DISCONTINUED] diltiazem (CARDIZEM CD) 120 MG 24 hr capsule Take 1 capsule (120 mg total) by mouth daily.    Allergies  Allergen Reactions  . Atorvastatin Other (See Comments)    Arthralgia, myalgia  . Fluvastatin Other (See Comments)    Arthralgia, myalgias  . Lisinopril Cough  . Metformin And Related Other (See Comments)    Anorexia, diarrhea, nausea, weight loss  . Pravastatin Other (See Comments)    Arthralgia, myalgias  . Simvastatin Other (See Comments)    myalgias  . Spiriva  Handihaler [Tiotropium Bromide Monohydrate] Nausea Only   Social History   Tobacco Use  . Smoking status: Former Smoker    Packs/day: 1.00    Years: 32.00    Pack years: 32.00    Types: Cigarettes    Last attempt to quit: 08/27/1984    Years since quitting: 33.7  . Smokeless tobacco: Never Used  Substance Use Topics  . Alcohol use: No  . Drug use: No   Social History   Social History Narrative   He is a married father of one. Does not smoke and does not drink. He walks routinely and also does stationary bike. 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  Wt Readings from Last 3 Encounters:  05/18/18 181 lb 12.8 oz (82.5 kg)  12/15/17 183 lb (83 kg)  11/18/17 182 lb (82.6 kg)    PHYSICAL EXAM BP 122/62   Pulse 100   Ht 6' (1.829 m)   Wt 181 lb 12.8 oz (82.5 kg)   SpO2 95%   BMI 24.66 kg/m  Physical Exam  Constitutional: He is oriented to person, place, and time. He appears well-developed and well-nourished. No distress.  Healthy appearing. - now looks his stated age  HENT:  Head: Normocephalic and atraumatic.  Neck: Normal range of motion. Neck supple. No hepatojugular reflux and no JVD present. Carotid bruit is not present.  Cardiovascular: Normal rate, regular rhythm and normal pulses.  Extrasystoles (Rare) are present. PMI is not displaced. Exam reveals distant heart sounds. Exam reveals no gallop.  Murmur heard.  Harsh crescendo-decrescendo early systolic murmur is present with a grade of 2/6 at the upper right sternal border radiating to the neck. Pulmonary/Chest: Effort normal and breath sounds normal. No respiratory  distress. He has no wheezes. He has no rales.  Abdominal: Soft. Bowel sounds are normal. He exhibits no distension. There is no tenderness. There  is no rebound.  Musculoskeletal: Normal range of motion. He exhibits no edema.  Neurological: He is alert and oriented to person, place, and time.  Psychiatric: He has a normal mood and affect. His behavior is normal. Judgment and thought content normal.  Nursing note and vitals reviewed.   Adult ECG Report  Rate: 106;  Rhythm: atrial fibrillation, premature ventricular contractions (PVC) and Diffuse ST-T wave changes in the inferolateral leads.;   Narrative Interpretation: Clearly now in A. fib RVR but asymptomatic  Other studies Reviewed: Additional studies/ records that were reviewed today include:  Recent Labs: Supposedly he had labs in January.  Should be due again in January.    ASSESSMENT / PLAN: Overall relatively stable.  A. fib rate seems to be a little bit higher than would like to be, but he is not overly symptomatic with it.  Problem List Items Addressed This Visit    Anticoagulant long-term use (Chronic)   CAD S/P PTCA-PCI then CABG x4 (Chronic)    No active angina symptoms.  He has not had a stress test since 2005 -was falsely abnormal with no significant findings on cath.  We therefore decided not to follow him with routine stress testing unless he has symptoms. He is on diltiazem for rate control of A. fib and antianginal benefit.  Did not tolerate eta blocker because of fatigue. He is on statin.  Not on aspirin because of Eliquis. Blood pressure would likely not tolerate addition of either ACE inhibitor or ARB.  With no active symptoms, will hold off on any further evaluation.      Relevant Medications   diltiazem (CARDIZEM CD) 180 MG 24 hr capsule   Dyslipidemia, goal LDL below 70 (Chronic)    Had cramping issues with Zocor.  Is now on rosuvastatin.  Labs to be followed by PCP in January.  Would like to have the results  sent here.      Relevant Medications   diltiazem (CARDIZEM CD) 180 MG 24 hr capsule   Essential hypertension (Chronic)    Relatively controlled on really diltiazem alone.      Relevant Medications   diltiazem (CARDIZEM CD) 180 MG 24 hr capsule   H/O amiodarone therapy (Chronic)    No longer on amiodarone.  Now on diltiazem only for rate control.      Longstanding persistent atrial fibrillation: CHA2DS2-VASc Score =4, On Eliquis. Diltiazem for rate control. - Primary (Chronic)    Rate not quite fully controlled.  May need to go well with 240 mg of diltiazem, however now been a start with the increase in the 180 mg.  Would like his resting heart rate to be in the 80s.  He will monitor that and we can discuss in follow-up if rates are still not unable controlled.  Continue Eliquis.  No bleeding      Relevant Medications   diltiazem (CARDIZEM CD) 180 MG 24 hr capsule      Current medicines are reviewed at length with the patient today. (+/- concerns) n/a The following changes have been made:Increase diltiazem 180 mg daily  Patient Instructions  Medication Instructions:  -- increase Diltiazem 180 mg  by mouth daily If you need a refill on your cardiac medications before your next appointment, please call your pharmacy.   Lab work: Not needed If you have labs (blood work) drawn today and your tests are completely normal, you will receive your results only by: [name] MyChart Message (if you have MyChart) OR . A paper  copy in the mail If you have any lab test that is abnormal or we need to change your treatment, we will call you to review the results.  Testing/Procedures: Not needed  Follow-Up: At [facility], you and your health needs are our priority.  As part of our continuing mission to provide you with exceptional heart care, we have created designated Provider Care Teams.  These Care Teams include your primary Cardiologist (physician) and Advanced Practice Providers (APPs -   Physician Assistants and Nurse Practitioners) who all work together to provide you with the care you need, when you need it. You will need a follow up appointment in 6 months.  Please call our office 2 months in advance to schedule this appointment.  You may see Glenetta Hew, MD or one of the following Advanced Practice Providers on your designated Care Team:   Rosaria Ferries, PA-C . Jory Sims, DNP, ANP  Any Other Special Instructions Will Be Listed Below (If Applicable).    Studies Ordered:   No orders of the defined types were placed in this encounter.     Glenetta Hew, M.D., M.S. Interventional Cardiologist   Pager # [phone] Phone # [phone] [address] [place],  [address]

## 2018-05-19 ENCOUNTER — Telehealth: Payer: Self-pay | Admitting: Cardiology

## 2018-05-19 NOTE — Telephone Encounter (Signed)
New message   [facility] pharmacy is calling to verify a prescription. Contacted Sharon at [employer] . She took the call.

## 2018-05-19 NOTE — Telephone Encounter (Signed)
SPOKE TO REPRESENTATIVE- CLARIFICATION OF DOSAGE OF DILITAZEM  PATIENT HAS STARTED  18 0MG AS OF 05/18/18 DISCONTINUE THE 120 MG DOSAGE  VERBALIZED UNDERSTANDING.

## 2018-05-24 ENCOUNTER — Encounter: Payer: Self-pay | Admitting: Cardiology

## 2018-05-24 NOTE — Assessment & Plan Note (Signed)
No active angina symptoms.  He has not had a stress test since 2005 -was falsely abnormal with no significant findings on cath.  We therefore decided not to follow him with routine stress testing unless he has symptoms. He is on diltiazem for rate control of A. fib and antianginal benefit.  Did not tolerate eta blocker because of fatigue. He is on statin.  Not on aspirin because of Eliquis. Blood pressure would likely not tolerate addition of either ACE inhibitor or ARB.  With no active symptoms, will hold off on any further evaluation.

## 2018-05-24 NOTE — Assessment & Plan Note (Signed)
No longer on amiodarone.  Now on diltiazem only for rate control.

## 2018-05-24 NOTE — Assessment & Plan Note (Addendum)
Rate not quite fully controlled.  May need to go well with 240 mg of diltiazem, however now been a start with the increase in the 180 mg.  Would like his resting heart rate to be in the 80s.  He will monitor that and we can discuss in follow-up if rates are still not unable controlled.  Continue Eliquis.  No bleeding

## 2018-05-24 NOTE — Assessment & Plan Note (Signed)
Relatively controlled on really diltiazem alone.

## 2018-05-24 NOTE — Assessment & Plan Note (Signed)
Had cramping issues with Zocor.  Is now on rosuvastatin.  Labs to be followed by PCP in January.  Would like to have the results sent here.

## 2018-05-25 ENCOUNTER — Telehealth: Payer: Self-pay | Admitting: *Deleted

## 2018-05-25 NOTE — Telephone Encounter (Signed)
Called and spoke to patient - informed him to let wife know -OFFICE RECEIVED PATIENT ASSISTANCE  APPLICATION FOR ELIQUIS   WILL NEED PATIENT PORTION FILLED OUT  , DOCUMENTATION FOR OUT OF POCKET [place] SIGNATURE  Patient states he will have wife call.

## 2018-05-25 NOTE — Telephone Encounter (Signed)
SPOKE TO WIFE . AWARE THAT PATIENT ASSISTANCE FORM NEEDS TO FILLED OUT AND INFORMATION OBTAINED AND BROUGHT OFFICE TO BE FAXED FOR 2020  ASSISTANCE  RN MAILED FORMS AND WIFE WILL BRING BACK WHEN COMPLETED.

## 2018-06-09 DIAGNOSIS — D485 Neoplasm of uncertain behavior of skin: Secondary | ICD-10-CM | POA: Diagnosis not present

## 2018-06-09 DIAGNOSIS — L814 Other melanin hyperpigmentation: Secondary | ICD-10-CM | POA: Diagnosis not present

## 2018-06-09 DIAGNOSIS — Z85828 Personal history of other malignant neoplasm of skin: Secondary | ICD-10-CM | POA: Diagnosis not present

## 2018-06-09 DIAGNOSIS — D1801 Hemangioma of skin and subcutaneous tissue: Secondary | ICD-10-CM | POA: Diagnosis not present

## 2018-06-09 DIAGNOSIS — L819 Disorder of pigmentation, unspecified: Secondary | ICD-10-CM | POA: Diagnosis not present

## 2018-06-09 DIAGNOSIS — L821 Other seborrheic keratosis: Secondary | ICD-10-CM | POA: Diagnosis not present

## 2018-06-09 DIAGNOSIS — D229 Melanocytic nevi, unspecified: Secondary | ICD-10-CM | POA: Diagnosis not present

## 2018-06-10 DIAGNOSIS — L820 Inflamed seborrheic keratosis: Secondary | ICD-10-CM | POA: Diagnosis not present

## 2018-07-29 DIAGNOSIS — Z125 Encounter for screening for malignant neoplasm of prostate: Secondary | ICD-10-CM | POA: Diagnosis not present

## 2018-08-05 DIAGNOSIS — I493 Ventricular premature depolarization: Secondary | ICD-10-CM | POA: Diagnosis not present

## 2018-08-05 DIAGNOSIS — E782 Mixed hyperlipidemia: Secondary | ICD-10-CM | POA: Diagnosis not present

## 2018-08-05 DIAGNOSIS — I2510 Atherosclerotic heart disease of native coronary artery without angina pectoris: Secondary | ICD-10-CM | POA: Diagnosis not present

## 2018-08-05 DIAGNOSIS — Z0000 Encounter for general adult medical examination without abnormal findings: Secondary | ICD-10-CM | POA: Diagnosis not present

## 2018-08-05 DIAGNOSIS — K5120 Ulcerative (chronic) proctitis without complications: Secondary | ICD-10-CM | POA: Diagnosis not present

## 2018-08-17 DIAGNOSIS — L578 Other skin changes due to chronic exposure to nonionizing radiation: Secondary | ICD-10-CM | POA: Diagnosis not present

## 2018-08-17 DIAGNOSIS — D485 Neoplasm of uncertain behavior of skin: Secondary | ICD-10-CM | POA: Diagnosis not present

## 2018-08-17 DIAGNOSIS — L820 Inflamed seborrheic keratosis: Secondary | ICD-10-CM | POA: Diagnosis not present

## 2018-08-17 DIAGNOSIS — B079 Viral wart, unspecified: Secondary | ICD-10-CM | POA: Diagnosis not present

## 2018-08-30 DIAGNOSIS — J4540 Moderate persistent asthma, uncomplicated: Secondary | ICD-10-CM | POA: Diagnosis not present

## 2018-08-30 DIAGNOSIS — J209 Acute bronchitis, unspecified: Secondary | ICD-10-CM | POA: Diagnosis not present

## 2018-08-31 ENCOUNTER — Ambulatory Visit
Admission: RE | Admit: 2018-08-31 | Discharge: 2018-08-31 | Disposition: A | Discharge status: Home / Self Care | Payer: PPO | Source: Ambulatory Visit | Attending: Family Medicine | Admitting: Family Medicine

## 2018-08-31 ENCOUNTER — Other Ambulatory Visit: Payer: Self-pay | Admitting: Family Medicine

## 2018-08-31 DIAGNOSIS — R059 Cough, unspecified: Secondary | ICD-10-CM

## 2018-08-31 DIAGNOSIS — R05 Cough: Secondary | ICD-10-CM

## 2018-10-03 DIAGNOSIS — I872 Venous insufficiency (chronic) (peripheral): Secondary | ICD-10-CM | POA: Diagnosis not present

## 2018-10-17 ENCOUNTER — Other Ambulatory Visit: Payer: Self-pay | Admitting: Cardiology

## 2018-10-17 MED ORDER — APIXABAN 2.5 MG PO TABS: 2.5000 mg | ORAL_TABLET | Freq: Two times a day (BID) | ORAL | 1 refills | Status: DC

## 2018-10-17 NOTE — Telephone Encounter (Signed)
°*  STAT* If patient is at the pharmacy, call can be transferred to refill team.   1. Which medications need to be refilled? (please list name of each medication and dose if known) apixaban (ELIQUIS) 5 MG TABS tablet  2. Which pharmacy/location (including street and city if local pharmacy) is medication to be sent to? [facility] ([place]) - [address], [place]  3. Do they need a 30 day or 90 day supply? 90 days

## 2018-10-17 NOTE — Telephone Encounter (Signed)
Last SCr was March 2019, 1.75.  Patient will be 78 in 5-6 weeks.  LMOM for patient to return call - will go ahead and switch him from 5 mg bid to 2.5 mg bid.  Would also recommend repeating BMET, but understand with COVID, he can wait 2-3 months until safe for him to be out.

## 2018-10-24 DIAGNOSIS — I872 Venous insufficiency (chronic) (peripheral): Secondary | ICD-10-CM | POA: Diagnosis not present

## 2018-10-24 DIAGNOSIS — H524 Presbyopia: Secondary | ICD-10-CM | POA: Diagnosis not present

## 2018-10-25 DIAGNOSIS — I872 Venous insufficiency (chronic) (peripheral): Secondary | ICD-10-CM | POA: Diagnosis not present

## 2018-10-25 DIAGNOSIS — E119 Type 2 diabetes mellitus without complications: Secondary | ICD-10-CM | POA: Diagnosis not present

## 2018-11-07 DIAGNOSIS — C44622 Squamous cell carcinoma of skin of right upper limb, including shoulder: Secondary | ICD-10-CM | POA: Diagnosis not present

## 2018-11-08 ENCOUNTER — Other Ambulatory Visit: Payer: Self-pay | Admitting: Internal Medicine

## 2018-11-10 ENCOUNTER — Telehealth: Payer: Self-pay

## 2018-11-10 NOTE — Telephone Encounter (Signed)
Virtual Visit Pre-Appointment Phone Call  "(Name), I am calling you today to discuss your upcoming appointment. We are currently trying to limit exposure to the virus that causes COVID-19 by seeing patients at home rather than in the office."  1. "What is the BEST phone number to call the day of the visit?" - [phone]  2. "Do you have or have access to (through a family member/friend) a smartphone with video capability that we can use for your visit?" a. If no - list the appointment type as a PHONE visit in appointment notes  3. Confirm consent - "In the setting of the current Covid19 crisis, you are scheduled for a (phone) visit with your provider on (April 27 ) at (10:40am ).  Just as we do with many in-office visits, in order for you to participate in this visit, we must obtain consent.  If you'd like, I can send this to your mychart (if signed up) or email for you to review.  Otherwise, I can obtain your verbal consent now.  All virtual visits are billed to your insurance company just like a normal visit would be.  By agreeing to a virtual visit, we'd like you to understand that the technology does not allow for your provider to perform an examination, and thus may limit your provider's ability to fully assess your condition. If your provider identifies any concerns that need to be evaluated in person, we will make arrangements to do so.  Finally, though the technology is pretty good, we cannot assure that it will always work on either your or our end, and in the setting of a video visit, we may have to convert it to a phone-only visit.  In either situation, we cannot ensure that we have a secure connection.  Are you willing to proceed?" STAFF: Did the patient verbally acknowledge consent to telehealth visit? Document YES/NO here: YES  4. Advise patient to be prepared - "Two hours prior to your appointment, go ahead and check your blood pressure, pulse, oxygen saturation, and your weight (if  you have the equipment to check those) and write them all down. When your visit starts, your provider will ask you for this information. If you have an Apple Watch or Kardia device, please plan to have heart rate information ready on the day of your appointment. Please have a pen and paper handy nearby the day of the visit as well."  5. Give patient instructions for MyChart download to smartphone OR Doximity/Doxy.me as below if video visit (depending on what platform provider is using)  6. Inform patient they will receive a phone call 15 minutes prior to their appointment time (may be from unknown caller ID) so they should be prepared to answer    Timothy Mclean has been deemed a candidate for a follow-up tele-health visit to limit community exposure during the Covid-19 pandemic. I spoke with the patient via phone to ensure availability of phone/video source, confirm preferred email & phone number, and discuss instructions and expectations.  I reminded Timothy Mclean to be prepared with any vital sign and/or heart rhythm information that could potentially be obtained via home monitoring, at the time of his visit. I reminded Timothy Mclean to expect a phone call prior to his visit.  Rhetta Mura, [place] 11/10/2018 10:02 AM   INSTRUCTIONS FOR DOWNLOADING THE MYCHART APP TO SMARTPHONE  - The patient must first make sure to have activated MyChart and  know their login information - If Apple, go to [employer] and type in MyChart in the search bar and download the app. If Android, ask patient to go to [employer] and type in [place] in the search bar and download the app. The app is free but as with any other app downloads, their phone may require them to verify saved payment information or Apple/Android password.  - The patient will need to then log into the app with their MyChart username and password, and select [facility] as their healthcare provider to link the account.  When it is time for your visit, go to the MyChart app, find appointments, and click Begin Video Visit. Be sure to Select Allow for your device to access the Microphone and Camera for your visit. You will then be connected, and your provider will be with you shortly.  **If they have any issues connecting, or need assistance please contact MyChart service desk [phone])**  **If using a computer, in order to ensure the best quality for their visit they will need to use either of the following Internet Browsers: [employer], or Google Chrome**  IF USING DOXIMITY or DOXY.ME - The patient will receive a link just prior to their visit by text.     FULL LENGTH CONSENT FOR TELE-HEALTH VISIT   I hereby voluntarily request, consent and authorize [facility] and its employed or contracted physicians, physician assistants, nurse practitioners or other licensed health care professionals (the Practitioner), to provide me with telemedicine health care services (the "Services") as deemed necessary by the treating Practitioner. I acknowledge and consent to receive the Services by the Practitioner via telemedicine. I understand that the telemedicine visit will involve communicating with the Practitioner through live audiovisual communication technology and the disclosure of certain medical information by electronic transmission. I acknowledge that I have been given the opportunity to request an in-person assessment or other available alternative prior to the telemedicine visit and am voluntarily participating in the telemedicine visit.  I understand that I have the right to withhold or withdraw my consent to the use of telemedicine in the course of my care at any time, without affecting my right to future care or treatment, and that the Practitioner or I may terminate the telemedicine visit at any time. I understand that I have the right to inspect all information obtained and/or recorded in the  course of the telemedicine visit and may receive copies of available information for a reasonable fee.  I understand that some of the potential risks of receiving the Services via telemedicine include:  [name] Delay or interruption in medical evaluation due to technological equipment failure or disruption; . Information transmitted may not be sufficient (e.g. poor resolution of images) to allow for appropriate medical decision making by the Practitioner; and/or  . In rare instances, security protocols could fail, causing a breach of personal health information.  Furthermore, I acknowledge that it is my responsibility to provide information about my medical history, conditions and care that is complete and accurate to the best of my ability. I acknowledge that Practitioner's advice, recommendations, and/or decision may be based on factors not within their control, such as incomplete or inaccurate data provided by me or distortions of diagnostic images or specimens that may result from electronic transmissions. I understand that the practice of medicine is not an exact science and that Practitioner makes no warranties or guarantees regarding treatment outcomes. I acknowledge that I will receive a copy of this  consent concurrently upon execution via email to the email address I last provided but may also request a printed copy by calling the office of [facility].    I understand that my insurance will be billed for this visit.   I have read or had this consent read to me. . I understand the contents of this consent, which adequately explains the benefits and risks of the Services being provided via telemedicine.  . I have been provided ample opportunity to ask questions regarding this consent and the Services and have had my questions answered to my satisfaction. . I give my informed consent for the services to be provided through the use of telemedicine in my medical care  By participating in this  telemedicine visit I agree to the above.

## 2018-11-11 ENCOUNTER — Telehealth: Payer: Self-pay | Admitting: Cardiology

## 2018-11-11 NOTE — Telephone Encounter (Signed)
Home phone/ my chart/ consent/ pre reg completed

## 2018-11-14 ENCOUNTER — Telehealth (INDEPENDENT_AMBULATORY_CARE_PROVIDER_SITE_OTHER): Payer: PPO | Admitting: Cardiology

## 2018-11-14 ENCOUNTER — Telehealth: Payer: Self-pay | Admitting: *Deleted

## 2018-11-14 ENCOUNTER — Encounter: Payer: Self-pay | Admitting: Cardiology

## 2018-11-14 VITALS — BP 104/64 | HR 95 | Ht 72.0 in | Wt 174.0 lb

## 2018-11-14 DIAGNOSIS — E785 Hyperlipidemia, unspecified: Secondary | ICD-10-CM

## 2018-11-14 DIAGNOSIS — I10 Essential (primary) hypertension: Secondary | ICD-10-CM

## 2018-11-14 DIAGNOSIS — E1169 Type 2 diabetes mellitus with other specified complication: Secondary | ICD-10-CM

## 2018-11-14 DIAGNOSIS — I4811 Longstanding persistent atrial fibrillation: Secondary | ICD-10-CM

## 2018-11-14 DIAGNOSIS — M7989 Other specified soft tissue disorders: Secondary | ICD-10-CM | POA: Insufficient documentation

## 2018-11-14 DIAGNOSIS — I2510 Atherosclerotic heart disease of native coronary artery without angina pectoris: Secondary | ICD-10-CM

## 2018-11-14 NOTE — Progress Notes (Signed)
Virtual Visit via Telephone Note   This visit type was conducted due to national recommendations for restrictions regarding the COVID-19 Pandemic (e.g. social distancing) in an effort to limit this patient's exposure and mitigate transmission in our community.  Due to his co-morbid illnesses, this patient is at least at moderate risk for complications without adequate follow up.  This format is felt to be most appropriate for this patient at this time.  The patient did not have access to video technology/had technical difficulties with video requiring transitioning to audio format only (telephone).  All issues noted in this document were discussed and addressed.  No physical exam could be performed with this format.  Please refer to the patient's chart for his  consent to telehealth for [facility].   No video technology available.   Patient has given verbal permission to conduct this visit via virtual appointment and to bill insurance 11/10/2018 10:02 AM     Evaluation Performed:  Follow-up visit  Date:  11/14/2018   ID:  Timothy Mclean, DOB [birth year], MRN [id number]  Patient Location: Home Provider Location: Home  PCP:  Derinda Late, MD  Cardiologist:  Glenetta Hew, MD  Electrophysiologist:  None   Chief Complaint: 63-monthfollow-up: CAD, atrial fibrillation  History of Present Illness:    Timothy MAYABBis a 78y.o. male with PMH notable for CAD-CABG along with persistent/paroxysmal A. fib (on Eliquis and diltiazem) who presents via audio/video conferencing for a telehealth visit today for routine 649-monthollow-up.. Marland KitchenHe had previously been on amiodarone for A. fib, but this was discontinued and is now rate controlled on diltiazem. He also has COPD, hypertension hyperlipidemia as well as diabetes mellitus, type II.  Timothy Mclean last seen in October 2019  -was doing well.  No symptoms of heart failure, angina.  No real sensation of A. fib  Interval History:  Timothy Mclean  doing relatively well - just continues to be "slowing down a bit".  Still trying to go outside for walks - especially with improving weather.  Also does inside stationary bicycle. Has his normal baseline dyspnea - nothing worse. No sensation of rapid or irregular heartbeats.  (Wife helps out answering most ?s & giving details).   ~ 6 weeks ago had pedal edema - PCP recommended support stockings  -- then Rx'd lasix & told him not to take Aleve.  Cardiovascular ROS: positive for - edema and improved as noted negative for - chest pain, dyspnea on exertion, irregular heartbeat, orthopnea, palpitations, paroxysmal nocturnal dyspnea, rapid heart rate or syncope / near syncope, TIA/amaurosis fugax.  10/17/2018 - Eliquis reduced to 2.5 mg due to age   The patient does not have symptoms concerning for COVID-19 infection (fever, chills, cough, or new shortness of breath).  The patient is practicing social distancing.  Wife goes to grocery store. Son helps getting groceries & taking her to store. ClAvrahams staying home.   ROS:  Please see the history of present illness.    Review of Systems  Constitutional: Negative for chills, fever and malaise/fatigue ("slowing down" some).  HENT: Negative for congestion and nosebleeds.   Respiratory: Positive for shortness of breath (baseline). Negative for cough (occasionally - better with rescue inhaler) and wheezing.   Cardiovascular: Positive for leg swelling (see above).  Gastrointestinal: Negative for blood in stool, diarrhea, heartburn, melena, nausea and vomiting.  Genitourinary: Negative for dysuria and flank pain.       Nocturia  Musculoskeletal: Positive for  back pain and joint pain.  Neurological: Negative for dizziness, focal weakness, weakness and headaches.  Psychiatric/Behavioral: Positive for memory loss. Negative for depression. The patient is not nervous/anxious and does not have insomnia.     Past Medical History:  Diagnosis Date  . Allergy    . Anxiety   . Asthma    since 2013  . Barrett's esophagus 03/09/2014  . Bilateral lumbar radiculopathy    since 2013  . CAD S/P percutaneous coronary angioplasty 1987   a. 1987 angioplasty and BMS- Cx; b. 1997 CABG x 4;  c. MYOVIEW 1/'05: ? Ischemia vs. artifact --> sent for cath - patent grafts.  . Chronic atrial fibrillation    a. Prev on amiodarone-->d/c 08/2016 in setting of recurrent AF, LFT abnormalities, and pulmonary symptoms;  b. CHA2DS2VASc = 4-->eliquis.  . Chronic bronchitis ([place])    since 2007  . COPD (chronic obstructive pulmonary disease) ([place])   . DDD (degenerative disc disease)   . Diverticulosis   . Dyslipidemia, goal LDL below 70    Doing better off of Zocor, currently on Lescol   . ED (erectile dysfunction)   . Elevated LFTs   . Esophageal reflux   . GERD (gastroesophageal reflux disease)   . Gout, unspecified   . Hearing loss   . Helicobacter pylori gastritis 07/2009  . Herpes zoster 2009  . Hypothyroidism    since 2010  . Iron deficiency anemia, unspecified   . Leg weakness    a. 09/2016 ABI's: R 1.05, L 0.98.  [name] Other and unspecified hyperlipidemia   . Other B-complex deficiencies   . Personal history of colonic polyps   . Prostatitis    chronic ulcerative  . Pulmonary fibrosis ([place])    and bronchiectasis, since 2015  . Renal stone    Right  . S/P CABG x 4 1997   a) LIMA-LAD, SVG to OM, SVG-dRCA-RPL; b) FALSE + MYOVIEW --> CATH 1/'05: 100% LAD after widelly patent D1, all Cx OM branches 100%, pRCA 100%, SVG-PDA patent w/ retrograde filling of RPL, SVG-OM widely patent ~ normal OM, LIMA-LAD patent.  [name] Spinal stenosis   . ST elevation myocardial infarction (STEMI) of inferior wall, subsequent episode of care ([place]) 1987, 1997   a) PTCA of Cx; b) CABG   . Type II or unspecified type diabetes mellitus without mention of complication, not stated as uncontrolled    no meds  . Ulcerative colitis, unspecified   . Unspecified essential hypertension   .  Vitamin D deficiency    Past Surgical History:  Procedure Laterality Date  . BLEPHAROPLASTY Bilateral    for ptosis  . CARDIAC CATHETERIZATION  08/17/2003   "False positive stress test " grafts patent; RCA proximal 100% LAD 100% occlusion after normal D1 with 80% ostial as SP1; circumflex patent but OM1 on to all occluded; EF 50-55%  . CARDIAC CATHETERIZATION  11/1995   Preop CABG: LAD 90% at D1, circumflex 100 and OM a 90% proximal, 80% distal  . Carotid Duplex Doppler  12/23/2009   Right&Left ICAs 0-49%, mildly abnormal study,   . CHOLECYSTECTOMY    . COLONOSCOPY    . CORONARY ARTERY BYPASS GRAFT  11/1995   LIMA-LAD, SVG to OM, SVG-dRCA-RPL  . ESOPHAGOGASTRODUODENOSCOPY    . FLEXIBLE SIGMOIDOSCOPY    . NM MYOCAR PERF EJECTION FRACTION  07/03/2003   FALSE POSITIVE TEST; Bruce protocol, negative test with scintigraphic evidence of inferoapical scar, diaphragmatic attenuation, moderate ischemia.  [name] NM MYOVIEW LTD  2005  Questionable ischemia that is likely either infarct versus artifact; normal  . TRANSTHORACIC ECHOCARDIOGRAM  12/2017   EF 55-60%.  Unable to assess diastolic function due to A. fib.  Moderate LA/RA dilation.  Mild MR.     Current Meds  Medication Sig  . ADVAIR DISKUS 250-50 MCG/DOSE AEPB Inhale 1 puff into the lungs 2 (two) times daily.   [name] albuterol (PROVENTIL HFA;VENTOLIN HFA) 108 (90 Base) MCG/ACT inhaler Inhale into the lungs every 6 (six) hours as needed for wheezing or shortness of breath.  [name] apixaban (ELIQUIS) 2.5 MG TABS tablet Take 1 tablet (2.5 mg total) by mouth 2 (two) times daily.  . balsalazide (COLAZAL) 750 MG capsule TAKE 2 CAPSULES BY MOUTH TWICE A DAY  . BAYER CONTOUR TEST test strip   . CRESTOR 20 MG tablet Take 1 tablet by mouth daily.  . cyanocobalamin 2000 MCG tablet Take 2,000 mcg by mouth daily.  [name] diltiazem (CARDIZEM CD) 180 MG 24 hr capsule Take 1 capsule (180 mg total) by mouth daily.  . finasteride (PROSCAR) 5 MG tablet Take 1 tablet by  mouth daily.  . furosemide (LASIX) 20 MG tablet Take 20 mg by mouth daily.  [name] glipiZIDE (GLUCOTROL XL) 5 MG 24 hr tablet Take 5 mg by mouth daily.  [name] JANUVIA 50 MG tablet Take 50 mg by mouth daily.  [name] levothyroxine (SYNTHROID, LEVOTHROID) 75 MCG tablet Take 1 tablet by mouth daily. Take 1 tab daily  . metFORMIN (GLUCOPHAGE) 500 MG tablet Take 500 mg by mouth 2 (two) times a day.   . oxybutynin (DITROPAN) 5 MG tablet Take 5 mg by mouth 1 day or 1 dose.   . pantoprazole (PROTONIX) 40 MG tablet Take 1 tablet by mouth 2 (two) times daily.   . vitamin D, CHOLECALCIFEROL, 400 UNITS tablet Take 400 Units by mouth daily.     Allergies:   Atorvastatin; Fluvastatin; Lisinopril; Metformin and related; Pravastatin; Simvastatin; and Spiriva handihaler [tiotropium bromide monohydrate]   Social History   Tobacco Use  . Smoking status: Former Smoker    Packs/day: 1.00    Years: 32.00    Pack years: 32.00    Types: Cigarettes    Last attempt to quit: 08/27/1984    Years since quitting: 34.2  . Smokeless tobacco: Never Used  Substance Use Topics  . Alcohol use: No  . Drug use: No     Family Hx: The patient's family history includes CVA in his sister; Congestive Heart Failure in his mother; Dementia in his father; Diabetes in his mother and sister; Heart disease in his sister; Hypertension in his sister; Lung cancer in his brother; Pneumonia in his father. There is no history of Colon cancer, Esophageal cancer, Rectal cancer, or Stomach cancer.   Prior CV studies:   The following studies were reviewed today: . No new studies  Labs/Other Tests and Data Reviewed:    EKG:  No ECG reviewed.  Recent Labs: No results found for requested labs within last 8760 hours.   Recent Lipid Panel No results found for: CHOL, TRIG, HDL, CHOLHDL, LDLCALC, LDLDIRECT -- PCP checked in Jan (no changes made)  Wt Readings from Last 3 Encounters:  11/14/18 174 lb (78.9 kg)  05/18/18 181 lb 12.8 oz (82.5 kg)   12/15/17 183 lb (83 kg)     Objective:    Vital Signs:  BP 104/64 (BP Location: Left Arm, Patient Position: Sitting, Cuff Size: Normal)   Pulse 95   Ht 6' (1.829 m)  Wt 174 lb (78.9 kg)   BMI 23.60 kg/m   VITAL SIGNS:  reviewed GEN:  NAD.  still a bit slow to respond.  RESPIRATORY:  normal respiratory effort - no overt wheezing NEURO:  alert and oriented x 3, no obvious focal deficit PSYCH:  normal affect   ASSESSMENT & PLAN:    Problem List Items Addressed This Visit    CAD S/P PTCA-PCI then CABG x4 - Primary (Chronic)    Timothy Mclean seems to doing quite well with no active angina symptoms.  History of falls positive stress test years ago.  As such we decided not to do routine testing, especially with his age. Not on beta-blocker because we switched him to diltiazem for rate control for A. fib.  This was secondary to fatigue from beta-blocker. We actually stopped his switch his statin from Zocor to rosuvastatin because of myalgias and memory issues.  Seems to be doing relatively well.  Labs have been followed by PCP. Is not on aspirin because of Eliquis.  Blood pressures not tolerated ACE inhibitor or ARB.  Without any active angina has been being stable, we are not doing a further evaluation.      Essential hypertension (Chronic)    Blood pressure looks good on low-medium dose of diltiazem.      Relevant Medications   furosemide (LASIX) 20 MG tablet   Hyperlipidemia associated with type 2 diabetes mellitus (HCC) (Chronic)    Switch to rosuvastatin 20 mg.  Seems to be doing well.  Labs were checked by PCP in January, unfortunately with inability to be at the office I do not have access to the labs.  Target LDL should be less than 70, will defer to PCP.  If closed, would probably opt to avoid increasing dose in order to reduce potential concerns of statin related memory issues.      Relevant Medications   glipiZIDE (GLUCOTROL XL) 5 MG 24 hr tablet   Leg swelling    Patient had  edema persistently sound like his heart failure.  Probably could be related to the increased dose of diltiazem versus just some mild venous stasis changes.  Was started on Lasix by PCP which I agree with.  Also do agree with the support stockings.  I did tell him if he has been worsening edema, it is okay to take an additional dose.      Longstanding persistent atrial fibrillation: CHA2DS2-VASc Score =4, On Eliquis. Diltiazem for rate control. (Chronic)    Rate seems to be relatively controlled with the increased dose of diltiazem.  At this time with borderline blood pressure abdominal increase any further.  He seems to be relatively asymptomatic just a little bit slowing of activity level.  In the absence of any breakthrough tachycardia, will probably do not need to increase any further.  Avoiding beta-blocker because of fatigue. Is on Eliquis now with no bleeding issues.  Apparently has been switched to 2.5 mg twice daily based on the fact that he will be turning 78 next month. May need to review patient assistance paperwork.      Relevant Medications   furosemide (LASIX) 20 MG tablet      COVID-19 Education: The signs and symptoms of COVID-19 were discussed with the patient and how to seek care for testing (follow up with PCP or arrange E-visit).   The importance of social distancing was discussed today.  Time:   Today, I have spent 22 minutes with the patient with telehealth technology discussing  the above problems.     Medication Adjustments/Labs and Tests Ordered: Current medicines are reviewed at length with the patient today.  Concerns regarding medicines are outlined above.  Medication Instructions:  No changes Ivin Booty will discuss Eliquis assistance forms with you (for new dose of 2.5 mg)  Tests Ordered: No orders of the defined types were placed in this encounter.   Medication Changes: No orders of the defined types were placed in this encounter. Continue with 2.5 mg BID  Eliquis.  Disposition:  Follow up in 6 month(s)    Signed, Glenetta Hew, MD  11/14/2018 12:20 PM    [facility]

## 2018-11-14 NOTE — Assessment & Plan Note (Signed)
Rate seems to be relatively controlled with the increased dose of diltiazem.  At this time with borderline blood pressure abdominal increase any further.  He seems to be relatively asymptomatic just a little bit slowing of activity level.  In the absence of any breakthrough tachycardia, will probably do not need to increase any further.  Avoiding beta-blocker because of fatigue. Is on Eliquis now with no bleeding issues.  Apparently has been switched to 2.5 mg twice daily based on the fact that he will be turning 78 next month. May need to review patient assistance paperwork.

## 2018-11-14 NOTE — Assessment & Plan Note (Signed)
Patient had edema persistently sound like his heart failure.  Probably could be related to the increased dose of diltiazem versus just some mild venous stasis changes.  Was started on Lasix by PCP which I agree with.  Also do agree with the support stockings.  I did tell him if he has been worsening edema, it is okay to take an additional dose.

## 2018-11-14 NOTE — Assessment & Plan Note (Addendum)
Switch to rosuvastatin 20 mg.  Seems to be doing well.  Labs were checked by PCP in January, unfortunately with inability to be at the office I do not have access to the labs.  Target LDL should be less than 70, will defer to PCP.  If closed, would probably opt to avoid increasing dose in order to reduce potential concerns of statin related memory issues.

## 2018-11-14 NOTE — Telephone Encounter (Signed)
SPOKE TO WIFE - REVIEWED INSTRUCTIONS.  AVS SUMMARY WILL BE MAILED .   REQUEST  PATIENT ASSISTANCE FORM FOR 2.5 MG ELIQUIS MAILED TO PATIENT TO FILL OUT AND MAILED BACK TO SEND TO PATIENT ASSISTANCE.   WIFE VERBALIZED UNDERSTANDING.

## 2018-11-14 NOTE — Patient Instructions (Addendum)
Medication Instructions:   Eliquis dose was reduced to 2.5 mg BID -- may need to refill out patient assistance paperwork.  Keep taking the fluid pill prescribed by Dr. Sandi Mariscal -- can take an additional tablet for worsening swelling.   If you need a refill on your cardiac medications before your next appointment, please call your pharmacy.   Lab work: none   Testing/Procedures: none  Follow-Up: At [facility], you and your health needs are our priority.  As part of our continuing mission to provide you with exceptional heart care, we have created designated Provider Care Teams.  These Care Teams include your primary Cardiologist (physician) and Advanced Practice Providers (APPs -  Physician Assistants and Nurse Practitioners) who all work together to provide you with the care you need, when you need it. You will need a follow up appointment in  6   months  OCT 2020.  Please call our office 2 months in advance to schedule this appointment.  You may see Glenetta Hew, MD or one of the following Advanced Practice Providers on your designated Care Team:   Rosaria Ferries, PA-C . Jory Sims, DNP, ANP  Any Other Special Instructions Will Be Listed Below (If Applicable).

## 2018-11-14 NOTE — Assessment & Plan Note (Addendum)
Timothy Mclean seems to doing quite well with no active angina symptoms.  History of falls positive stress test years ago.  As such we decided not to do routine testing, especially with his age. Not on beta-blocker because we switched him to diltiazem for rate control for A. fib.  This was secondary to fatigue from beta-blocker. We actually stopped his switch his statin from Zocor to rosuvastatin because of myalgias and memory issues.  Seems to be doing relatively well.  Labs have been followed by PCP. Is not on aspirin because of Eliquis.  Blood pressures not tolerated ACE inhibitor or ARB.  Without any active angina has been being stable, we are not doing a further evaluation.

## 2018-11-14 NOTE — Assessment & Plan Note (Signed)
Blood pressure looks good on low-medium dose of diltiazem.

## 2018-11-18 ENCOUNTER — Telehealth: Payer: Self-pay | Admitting: *Deleted

## 2018-11-18 NOTE — Telephone Encounter (Signed)
Spoke to wife . Informed her  Patient assistance Eliqius 2.5 mg will be placed in the mail for to fill out and send back to the office.  She verbalized understanding.

## 2018-11-29 DIAGNOSIS — E039 Hypothyroidism, unspecified: Secondary | ICD-10-CM | POA: Diagnosis not present

## 2018-12-07 DIAGNOSIS — Z08 Encounter for follow-up examination after completed treatment for malignant neoplasm: Secondary | ICD-10-CM | POA: Diagnosis not present

## 2018-12-07 DIAGNOSIS — L820 Inflamed seborrheic keratosis: Secondary | ICD-10-CM | POA: Diagnosis not present

## 2018-12-07 DIAGNOSIS — Z85828 Personal history of other malignant neoplasm of skin: Secondary | ICD-10-CM | POA: Diagnosis not present

## 2019-01-24 DIAGNOSIS — E119 Type 2 diabetes mellitus without complications: Secondary | ICD-10-CM | POA: Diagnosis not present

## 2019-02-03 ENCOUNTER — Telehealth: Payer: Self-pay | Admitting: Cardiology

## 2019-02-03 NOTE — Telephone Encounter (Signed)
PER PT'S WIFE REQUESTED DOCUMENTS SENT OUT ON Monday VIA MAIL .Timothy Mclean

## 2019-02-03 NOTE — Telephone Encounter (Signed)
° °  Lauren from [facility] was calling to get information for the patient's Eliquis application for free medication through the foundation.  The foundation has tried to contact the patient to submit his proof of income. The patient has not submitted any information to the companyThe foundation would prefer a copy of his 2019 taxes, but 2 consecutive pay stubs, or a W2 would be acceptable. The foundation can NOT accept bank statements as a way to verify income.  Without this information, the foundation will not be able to approve the application for the patient to receive free medication.  If the office recieves a call from the patient regarding his application, please direct him to the foundation.  He can call the foundation at [phone] for provide the financial information required.

## 2019-04-19 DIAGNOSIS — H25813 Combined forms of age-related cataract, bilateral: Secondary | ICD-10-CM | POA: Diagnosis not present

## 2019-04-19 DIAGNOSIS — H04123 Dry eye syndrome of bilateral lacrimal glands: Secondary | ICD-10-CM | POA: Diagnosis not present

## 2019-04-19 DIAGNOSIS — H02403 Unspecified ptosis of bilateral eyelids: Secondary | ICD-10-CM | POA: Diagnosis not present

## 2019-04-19 DIAGNOSIS — E119 Type 2 diabetes mellitus without complications: Secondary | ICD-10-CM | POA: Diagnosis not present

## 2019-05-11 DIAGNOSIS — Z23 Encounter for immunization: Secondary | ICD-10-CM | POA: Diagnosis not present

## 2019-05-22 ENCOUNTER — Ambulatory Visit: Payer: PPO | Admitting: Cardiology

## 2019-05-22 ENCOUNTER — Encounter: Payer: Self-pay | Admitting: Cardiology

## 2019-05-22 ENCOUNTER — Other Ambulatory Visit: Payer: Self-pay

## 2019-05-22 VITALS — BP 123/69 | HR 90 | Ht 72.0 in | Wt 172.0 lb

## 2019-05-22 DIAGNOSIS — I25810 Atherosclerosis of coronary artery bypass graft(s) without angina pectoris: Secondary | ICD-10-CM | POA: Diagnosis not present

## 2019-05-22 DIAGNOSIS — I4811 Longstanding persistent atrial fibrillation: Secondary | ICD-10-CM

## 2019-05-22 DIAGNOSIS — I10 Essential (primary) hypertension: Secondary | ICD-10-CM

## 2019-05-22 DIAGNOSIS — I952 Hypotension due to drugs: Secondary | ICD-10-CM | POA: Diagnosis not present

## 2019-05-22 DIAGNOSIS — I2510 Atherosclerotic heart disease of native coronary artery without angina pectoris: Secondary | ICD-10-CM

## 2019-05-22 DIAGNOSIS — E785 Hyperlipidemia, unspecified: Secondary | ICD-10-CM | POA: Diagnosis not present

## 2019-05-22 DIAGNOSIS — M7989 Other specified soft tissue disorders: Secondary | ICD-10-CM

## 2019-05-22 DIAGNOSIS — E1169 Type 2 diabetes mellitus with other specified complication: Secondary | ICD-10-CM

## 2019-05-22 NOTE — Patient Instructions (Signed)

## 2019-05-22 NOTE — Progress Notes (Signed)
PCP: Derinda Late, MD  Clinic Note: Chief Complaint  Patient presents with   Follow-up    6 months   Coronary Artery Disease    HPI:   Timothy Mclean is a 78 y.o. male with a PMH below who presents today for 6 month f/u. -- Cardiac Hx: CAD-CABG, PAF (persistent - on Eliquis & Diltiazem).  Timothy Mclean was last seen in April 2020 via [facility] -- is doing relatively well - just continues to be "slowing down a bit".  Still trying to go outside for walks - especially with improving weather.  Also does inside stationary bicycle. Has his normal baseline dyspnea - nothing worse. No sensation of rapid or irregular heartbeats.  Recent Hospitalizations: None  Reviewed  CV studies:    The following studies were reviewed today: (if available, images/films reviewed: From Epic Chart or Care Everywhere)  None  Interval History:   Timothy Mclean presents here today for routine follow-up doing quite well.  He still walks, does not do as much he used to, but walks with a cane and relatively slowly.  He got out of the habit of doing a stationary bicycle, and has become more sedentary over the last several months.  He and his wife pretty much stay at home now, do not go out very much secondary to COVID-19 restrictions.  He was relatively asymptomatic overall from cardiac standpoint.  No sensation whatsoever being in A. fib.  No complaints of exertional dyspnea or chest discomfort.  Cardiovascular review of symptoms: (SUMMARY) no chest pain or dyspnea on exertion positive for - edema and Relatively controlled swelling, just lack of energy, lack of get up and go.  Mild bruising. negative for - irregular heartbeat, orthopnea, palpitations, paroxysmal nocturnal dyspnea, rapid heart rate, shortness of breath or Syncope/near syncope, TIA/amaurosis fugax, claudication.  The patient does not have symptoms concerning for COVID-19 infection (fever, chills, cough, or new shortness of breath).  The  patient is practicing social distancing. ++ Masking.  Not really going out for groceries/shopping, his wife usually does most of this is due to their family and friends.   REVIEWED OF SYSTEMS   ROS: A comprehensive was performed. ROS   I have reviewed and (if needed) personally updated the patient's problem list, medications, allergies, past medical and surgical history, social and family history.   PAST MEDICAL HISTORY   Past Medical History:  Diagnosis Date   Allergy    Anxiety    Asthma    since 2013   Barrett's esophagus 03/09/2014   Bilateral lumbar radiculopathy    since 2013   CAD S/P percutaneous coronary angioplasty 1987   a. 1987 angioplasty and BMS- Cx; b. 1997 CABG x 4;  c. MYOVIEW 1/'05: ? Ischemia vs. artifact --> sent for cath - patent grafts.   Chronic atrial fibrillation ([place])    a. Prev on amiodarone-->d/c 08/2016 in setting of recurrent AF, LFT abnormalities, and pulmonary symptoms;  b. CHA2DS2VASc = 4-->eliquis.   Chronic bronchitis ([place])    since 2007   COPD (chronic obstructive pulmonary disease) (HCC)    DDD (degenerative disc disease)    Diverticulosis    Dyslipidemia, goal LDL below 70    Doing better off of Zocor, currently on Lescol    ED (erectile dysfunction)    Elevated LFTs    Esophageal reflux    GERD (gastroesophageal reflux disease)    Gout, unspecified    Hearing loss    Helicobacter pylori gastritis 07/2009  Herpes zoster 2009   Hypothyroidism    since 2010   Iron deficiency anemia, unspecified    Leg weakness    a. 09/2016 ABI's: R 1.05, L 0.98.   Other and unspecified hyperlipidemia    Other B-complex deficiencies    Personal history of colonic polyps    Prostatitis    chronic ulcerative   Pulmonary fibrosis (HCC)    and bronchiectasis, since 2015   Renal stone    Right   S/P CABG x 4 1997   a) LIMA-LAD, SVG to OM, SVG-dRCA-RPL; b) FALSE + MYOVIEW --> CATH 1/'05: 100% LAD after widelly patent D1,  all Cx OM branches 100%, pRCA 100%, SVG-PDA patent w/ retrograde filling of RPL, SVG-OM widely patent ~ normal OM, LIMA-LAD patent.   Spinal stenosis    ST elevation myocardial infarction (STEMI) of inferior wall, subsequent episode of care ([place]) 1987, 1997   a) PTCA of Cx; b) CABG    Type II or unspecified type diabetes mellitus without mention of complication, not stated as uncontrolled    no meds   Ulcerative colitis, unspecified    Unspecified essential hypertension    Vitamin D deficiency      PAST SURGICAL HISTORY   Past Surgical History:  Procedure Laterality Date   BLEPHAROPLASTY Bilateral    for ptosis   CARDIAC CATHETERIZATION  08/17/2003   "False positive stress test " grafts patent; RCA proximal 100% LAD 100% occlusion after normal D1 with 80% ostial as SP1; circumflex patent but OM1 on to all occluded; EF 50-55%   CARDIAC CATHETERIZATION  11/1995   Preop CABG: LAD 90% at D1, circumflex 100 and OM a 90% proximal, 80% distal   Carotid Duplex Doppler  12/23/2009   Right&Left ICAs 0-49%, mildly abnormal study,    CHOLECYSTECTOMY     COLONOSCOPY     CORONARY ARTERY BYPASS GRAFT  11/1995   LIMA-LAD, SVG to OM, SVG-dRCA-RPL   ESOPHAGOGASTRODUODENOSCOPY     FLEXIBLE SIGMOIDOSCOPY     NM MYOCAR PERF EJECTION FRACTION  07/03/2003   FALSE POSITIVE TEST; Bruce protocol, negative test with scintigraphic evidence of inferoapical scar, diaphragmatic attenuation, moderate ischemia.   NM MYOVIEW LTD  2005   Questionable ischemia that is likely either infarct versus artifact; normal   TRANSTHORACIC ECHOCARDIOGRAM  12/2017   EF 55-60%.  Unable to assess diastolic function due to A. fib.  Moderate LA/RA dilation.  Mild MR.     MEDICATIONS/ALLERGIES   Current Meds  Medication Sig   ADVAIR DISKUS 250-50 MCG/DOSE AEPB Inhale 1 puff into the lungs 2 (two) times daily.    albuterol (PROVENTIL HFA;VENTOLIN HFA) 108 (90 Base) MCG/ACT inhaler Inhale into the lungs every  6 (six) hours as needed for wheezing or shortness of breath.   apixaban (ELIQUIS) 2.5 MG TABS tablet Take 1 tablet (2.5 mg total) by mouth 2 (two) times daily.   balsalazide (COLAZAL) 750 MG capsule TAKE 2 CAPSULES BY MOUTH TWICE A DAY   BAYER CONTOUR TEST test strip    CRESTOR 20 MG tablet Take 1 tablet by mouth daily.   cyanocobalamin 2000 MCG tablet Take 2,000 mcg by mouth daily.   finasteride (PROSCAR) 5 MG tablet Take 1 tablet by mouth daily.   furosemide (LASIX) 20 MG tablet Take 20 mg by mouth daily.   glipiZIDE (GLUCOTROL XL) 5 MG 24 hr tablet Take 5 mg by mouth daily.   JANUVIA 50 MG tablet Take 50 mg by mouth daily.   levothyroxine (SYNTHROID, LEVOTHROID)  75 MCG tablet Take 1 tablet by mouth daily. Take 1 tab daily   metFORMIN (GLUCOPHAGE) 500 MG tablet Take 500 mg by mouth 2 (two) times a day.    oxybutynin (DITROPAN) 5 MG tablet Take 5 mg by mouth 1 day or 1 dose.    pantoprazole (PROTONIX) 40 MG tablet Take 1 tablet by mouth 2 (two) times daily.    vitamin D, CHOLECALCIFEROL, 400 UNITS tablet Take 400 Units by mouth daily.    Allergies  Allergen Reactions   Atorvastatin Other (See Comments)    Arthralgia, myalgia   Fluvastatin Other (See Comments)    Arthralgia, myalgias   Lisinopril Cough   Metformin And Related Other (See Comments)    Anorexia, diarrhea, nausea, weight loss   Pravastatin Other (See Comments)    Arthralgia, myalgias   Simvastatin Other (See Comments)    myalgias   Spiriva Handihaler [Tiotropium Bromide Monohydrate] Nausea Only     SOCIAL HISTORY/FAMILY HISTORY   Social History   Tobacco Use   Smoking status: Former Smoker    Packs/day: 1.00    Years: 32.00    Pack years: 32.00    Types: Cigarettes    Quit date: 08/27/1984    Years since quitting: 34.7   Smokeless tobacco: Never Used  Substance Use Topics   Alcohol use: No   Drug use: No   Social History   Social History Narrative   He is a married father of  one. Does not smoke and does not drink. He walks routinely and also does stationary bike. 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   OBJCTIVE -PE, EKG, labs   Wt Readings from Last 3 Encounters:  05/22/19 172 lb (78 kg)  11/14/18 174 lb (78.9 kg)  05/18/18 181 lb 12.8 oz (82.5 kg)    Physical Exam: BP 123/69    Pulse 90    Ht 6' (1.829 m)    Wt 172 lb (78 kg)    SpO2 95%    BMI 23.33 kg/m  Physical Exam  Constitutional: He is oriented to person, place, and time. He appears well-developed and well-nourished. No distress.  Healthy-appearing.  Well-groomed.  Does look as if he is aging  HENT:  Head: Normocephalic and atraumatic.  Neck: Normal range of motion. Neck supple. No JVD present.  Cardiovascular: Normal rate, S1 normal, S2 normal and intact distal pulses. An irregularly irregular rhythm present. PMI is not displaced. Exam reveals distant heart sounds. Exam reveals no gallop and no friction rub.  Murmur (Harsh 2/6 early peaking C-D SEM at RUSB--> neck) heard. Pulmonary/Chest: Effort normal and breath sounds normal. No respiratory distress. He has no wheezes. He has no rales.  Abdominal: Soft. Bowel sounds are normal. He exhibits no distension. There is no abdominal tenderness. There is no rebound.  Mild truncal obesity, but otherwise normal.  No HSM  Musculoskeletal: Normal range of motion.        General: Edema (Trivial) present.  Neurological: He is alert and oriented to person, place, and time.  Psychiatric: He has a normal mood and affect. His behavior is normal.  Somewhat slow to answer questions.  Wife answers most questions.  Vitals reviewed.   Adult ECG Report  Rate: 90 ;  Rhythm: atrial fibrillation and Normal axis, intervals and durations.;   Narrative Interpretation: Stable  Recent Labs: PCP due to check labs in January   ASSESSMENT/PLAN   Problem List Items Addressed This Visit    CAD S/P PTCA-PCI then CABG x4 (Chronic)    Doing well with no active angina.  He had a false positive stress test in the past and therefore we are not limited to routine testing unless he has significant symptoms.  On diltiazem in lieu of beta-blocker because of fatigue issues on beta-blocker. On rosuvastatin. Not on aspirin because of Eliquis. Because of hypotension issues no longer on ACE inhibitor/ARB.      Essential hypertension (Chronic)    Now stable blood pressure on low-moderate dose diltiazem      Longstanding persistent atrial fibrillation: CHA2DS2-VASc Score =4, On Eliquis. Diltiazem for rate control. - Primary (Chronic)    Pretty much permanent A. fib.  Rate control with diltiazem.  I backed off on it because of his unsteady gait.  Was also concerned about potential bradycardia. Not on beta-blocker because of fatigue. Tolerating Eliquis without any major bleeding just some bruising.  Reduced to lower dose Eliquis because of age.      Hyperlipidemia associated with type 2 diabetes mellitus (HCC) (Chronic)    Currently on rosuvastatin 20 mg daily.  Labs are to be followed by PCP in January.  I have not seen these labs.  Would like to see target LDL less than 70.      Hypotension due to drugs    We have previously reduced to no medications, now he is on diltiazem for rate control, but at lower dose.      Leg swelling    Pretty well controlled.  I just told him to take his Lasix more than every other day or as needed basis now.       Other Visit Diagnoses    Coronary artery disease involving coronary bypass graft of native heart without angina pectoris  (Chronic)      Relevant Orders   EKG 12-Lead  (Completed)      COVID-19 Education: The signs and symptoms of COVID-19 were discussed with the patient and how to seek care for testing (follow up with PCP or arrange E-visit).   The importance of social distancing was discussed today.  I spent a total of 26mnutes with the patient and chart review. >  50% of the time was spent in direct patient consultation.  Additional time spent with chart review (studies, outside notes, etc): 6 Total Time: 24 min  Current medicines are reviewed at length with the patient today.  (+/- concerns) NONE   Patient Instructions / Medication Changes & Studies & Tests Ordered   Patient Instructions  Medication Instructions:  NO CHANGES   *If you need a refill on your cardiac medications before your next appointment, please call your pharmacy*  Lab Work: NOT NEEDED   Testing/Procedures: NOT NEEDED  Follow-Up: At [facility] you and your health needs are our priority.  As part of our continuing mission to provide you with exceptional heart care, we have created designated Provider Care Teams.  These Care Teams include your primary Cardiologist (physician) and Advanced Practice Providers (APPs -  Physician Assistants and Nurse Practitioners) who all work together to provide you with the care you need, when you need it.  Your next appointment:   12 months  The format  for your next appointment:   In Person  Provider:   Glenetta Hew, MD  Other Instructions   Studies Ordered:   Orders Placed This Encounter  Procedures   EKG 12-Lead     Glenetta Hew, M.D., M.S. Interventional Cardiologist   Pager # [phone] Phone # [phone] [address] [place], [place] [address]   Thank you for choosing Heartcare at [facility]!!

## 2019-05-23 ENCOUNTER — Encounter: Payer: Self-pay | Admitting: Cardiology

## 2019-05-23 NOTE — Assessment & Plan Note (Signed)
Now stable blood pressure on low-moderate dose diltiazem

## 2019-05-23 NOTE — Assessment & Plan Note (Signed)
Currently on rosuvastatin 20 mg daily.  Labs are to be followed by PCP in January.  I have not seen these labs.  Would like to see target LDL less than 70.

## 2019-05-23 NOTE — Assessment & Plan Note (Signed)
We have previously reduced to no medications, now he is on diltiazem for rate control, but at lower dose.

## 2019-05-23 NOTE — Assessment & Plan Note (Signed)
Pretty much permanent A. fib.  Rate control with diltiazem.  I backed off on it because of his unsteady gait.  Was also concerned about potential bradycardia. Not on beta-blocker because of fatigue. Tolerating Eliquis without any major bleeding just some bruising.  Reduced to lower dose Eliquis because of age.

## 2019-05-23 NOTE — Assessment & Plan Note (Signed)
Doing well with no active angina.  He had a false positive stress test in the past and therefore we are not limited to routine testing unless he has significant symptoms.  On diltiazem in lieu of beta-blocker because of fatigue issues on beta-blocker. On rosuvastatin. Not on aspirin because of Eliquis. Because of hypotension issues no longer on ACE inhibitor/ARB.

## 2019-05-23 NOTE — Assessment & Plan Note (Signed)
Pretty well controlled.  I just told him to take his Lasix more than every other day or as needed basis now.

## 2019-06-09 ENCOUNTER — Other Ambulatory Visit: Payer: Self-pay

## 2019-06-09 MED ORDER — DILTIAZEM HCL ER COATED BEADS 180 MG PO CP24: 180.0000 mg | ORAL_CAPSULE | Freq: Every day | ORAL | 3 refills | Status: DC

## 2019-06-09 NOTE — Telephone Encounter (Signed)
Rx(s) sent to pharmacy electronically.  

## 2019-06-20 DIAGNOSIS — D225 Melanocytic nevi of trunk: Secondary | ICD-10-CM | POA: Diagnosis not present

## 2019-06-20 DIAGNOSIS — Z85828 Personal history of other malignant neoplasm of skin: Secondary | ICD-10-CM | POA: Diagnosis not present

## 2019-06-20 DIAGNOSIS — Z08 Encounter for follow-up examination after completed treatment for malignant neoplasm: Secondary | ICD-10-CM | POA: Diagnosis not present

## 2019-06-20 DIAGNOSIS — B078 Other viral warts: Secondary | ICD-10-CM | POA: Diagnosis not present

## 2019-06-28 ENCOUNTER — Telehealth: Payer: Self-pay | Admitting: Cardiology

## 2019-06-28 NOTE — Telephone Encounter (Signed)
Spoke Timothy Mclean ,  She states some information is not  present on form . Will review an fax  back

## 2019-06-28 NOTE — Telephone Encounter (Signed)
New Message  [place] from [facility] is calling in for patient. Transferred call to [facility].

## 2019-07-03 DIAGNOSIS — E1142 Type 2 diabetes mellitus with diabetic polyneuropathy: Secondary | ICD-10-CM | POA: Diagnosis not present

## 2019-07-03 DIAGNOSIS — D510 Vitamin B12 deficiency anemia due to intrinsic factor deficiency: Secondary | ICD-10-CM | POA: Diagnosis not present

## 2019-07-03 DIAGNOSIS — D519 Vitamin B12 deficiency anemia, unspecified: Secondary | ICD-10-CM | POA: Diagnosis not present

## 2019-07-03 DIAGNOSIS — E039 Hypothyroidism, unspecified: Secondary | ICD-10-CM | POA: Diagnosis not present

## 2019-07-03 DIAGNOSIS — I872 Venous insufficiency (chronic) (peripheral): Secondary | ICD-10-CM | POA: Diagnosis not present

## 2019-07-31 DIAGNOSIS — Z20822 Contact with and (suspected) exposure to covid-19: Secondary | ICD-10-CM | POA: Diagnosis not present

## 2019-07-31 DIAGNOSIS — Z1152 Encounter for screening for COVID-19: Secondary | ICD-10-CM | POA: Diagnosis not present

## 2019-08-18 DIAGNOSIS — Z125 Encounter for screening for malignant neoplasm of prostate: Secondary | ICD-10-CM | POA: Diagnosis not present

## 2019-08-21 ENCOUNTER — Other Ambulatory Visit: Payer: Self-pay

## 2019-08-21 ENCOUNTER — Other Ambulatory Visit: Payer: Self-pay | Admitting: Cardiology

## 2019-08-21 DIAGNOSIS — I4811 Longstanding persistent atrial fibrillation: Secondary | ICD-10-CM

## 2019-08-21 NOTE — Telephone Encounter (Signed)
*  STAT* If patient is at the pharmacy, call can be transferred to refill team.   1. Which medications need to be refilled? (please list name of each medication and dose if known) apixaban (ELIQUIS) 2.5 MG TABS tablet  2. Which pharmacy/location (including street and city if local pharmacy) is medication to be sent to? [facility], [place]  3. Do they need a 30 day or 90 day supply? [place]

## 2019-08-21 NOTE — Telephone Encounter (Signed)
Called and spoke w/pt regarding overdue labs orders placed and they stated that they would come this afternoon

## 2019-08-22 LAB — COMPREHENSIVE METABOLIC PANEL
ALT: 14 IU/L (ref 0–44)
AST: 23 IU/L (ref 0–40)
Albumin/Globulin Ratio: 2.3 — ABNORMAL HIGH (ref 1.2–2.2)
Albumin: 5.1 g/dL — ABNORMAL HIGH (ref 3.7–4.7)
Alkaline Phosphatase: 71 IU/L (ref 39–117)
BUN/Creatinine Ratio: 14 (ref 10–24)
BUN: 26 mg/dL (ref 8–27)
Bilirubin Total: 0.5 mg/dL (ref 0.0–1.2)
CO2: 23 mmol/L (ref 20–29)
Calcium: 10.1 mg/dL (ref 8.6–10.2)
Chloride: 104 mmol/L (ref 96–106)
Creatinine, Ser: 1.89 mg/dL — ABNORMAL HIGH (ref 0.76–1.27)
GFR calc Af Amer: 38 mL/min/{1.73_m2} — ABNORMAL LOW (ref >59)
GFR calc non Af Amer: 33 mL/min/{1.73_m2} — ABNORMAL LOW (ref >59)
Globulin, Total: 2.2 g/dL (ref 1.5–4.5)
Glucose: 66 mg/dL (ref 65–99)
Potassium: 4.6 mmol/L (ref 3.5–5.2)
Sodium: 145 mmol/L — ABNORMAL HIGH (ref 134–144)
Total Protein: 7.3 g/dL (ref 6.0–8.5)

## 2019-08-22 MED ORDER — APIXABAN 2.5 MG PO TABS: 2.5000 mg | ORAL_TABLET | Freq: Two times a day (BID) | ORAL | 1 refills | Status: DC

## 2019-08-22 NOTE — Telephone Encounter (Signed)
He is 78, he qualifies for 2.71m BID dosing. Refill sent in.

## 2019-08-22 NOTE — Telephone Encounter (Signed)
15m78kg Scr 1.89 (08/22/19) Lovw/harding 05/22/19 Pt requesting 2.5 eliquis when they qualify for 583meliquis. Will route to the pharmd pool for review

## 2019-08-25 DIAGNOSIS — I872 Venous insufficiency (chronic) (peripheral): Secondary | ICD-10-CM | POA: Diagnosis not present

## 2019-08-25 DIAGNOSIS — Z0000 Encounter for general adult medical examination without abnormal findings: Secondary | ICD-10-CM | POA: Diagnosis not present

## 2019-08-25 DIAGNOSIS — E1122 Type 2 diabetes mellitus with diabetic chronic kidney disease: Secondary | ICD-10-CM | POA: Diagnosis not present

## 2019-08-25 DIAGNOSIS — I2510 Atherosclerotic heart disease of native coronary artery without angina pectoris: Secondary | ICD-10-CM | POA: Diagnosis not present

## 2019-08-25 DIAGNOSIS — I4819 Other persistent atrial fibrillation: Secondary | ICD-10-CM | POA: Diagnosis not present

## 2019-08-25 DIAGNOSIS — I493 Ventricular premature depolarization: Secondary | ICD-10-CM | POA: Diagnosis not present

## 2019-08-25 DIAGNOSIS — E782 Mixed hyperlipidemia: Secondary | ICD-10-CM | POA: Diagnosis not present

## 2019-08-25 DIAGNOSIS — E1142 Type 2 diabetes mellitus with diabetic polyneuropathy: Secondary | ICD-10-CM | POA: Diagnosis not present

## 2019-08-25 DIAGNOSIS — N183 Chronic kidney disease, stage 3 unspecified: Secondary | ICD-10-CM | POA: Diagnosis not present

## 2019-10-16 DIAGNOSIS — H25813 Combined forms of age-related cataract, bilateral: Secondary | ICD-10-CM | POA: Diagnosis not present

## 2019-10-16 DIAGNOSIS — H02403 Unspecified ptosis of bilateral eyelids: Secondary | ICD-10-CM | POA: Diagnosis not present

## 2019-10-16 DIAGNOSIS — H04123 Dry eye syndrome of bilateral lacrimal glands: Secondary | ICD-10-CM | POA: Diagnosis not present

## 2019-10-16 DIAGNOSIS — E119 Type 2 diabetes mellitus without complications: Secondary | ICD-10-CM | POA: Diagnosis not present

## 2019-11-03 DIAGNOSIS — H25811 Combined forms of age-related cataract, right eye: Secondary | ICD-10-CM | POA: Diagnosis not present

## 2019-11-08 ENCOUNTER — Other Ambulatory Visit: Payer: Self-pay | Admitting: Internal Medicine

## 2019-11-21 DIAGNOSIS — E1122 Type 2 diabetes mellitus with diabetic chronic kidney disease: Secondary | ICD-10-CM | POA: Diagnosis not present

## 2019-11-21 DIAGNOSIS — N183 Chronic kidney disease, stage 3 unspecified: Secondary | ICD-10-CM | POA: Diagnosis not present

## 2019-11-21 DIAGNOSIS — E039 Hypothyroidism, unspecified: Secondary | ICD-10-CM | POA: Diagnosis not present

## 2019-11-21 DIAGNOSIS — E782 Mixed hyperlipidemia: Secondary | ICD-10-CM | POA: Diagnosis not present

## 2019-12-05 ENCOUNTER — Ambulatory Visit: Payer: PPO | Admitting: Internal Medicine

## 2019-12-05 ENCOUNTER — Encounter: Payer: Self-pay | Admitting: Internal Medicine

## 2019-12-05 VITALS — BP 110/78 | HR 69 | Ht 70.0 in | Wt 172.4 lb

## 2019-12-05 DIAGNOSIS — K2270 Barrett's esophagus without dysplasia: Secondary | ICD-10-CM

## 2019-12-05 DIAGNOSIS — K5100 Ulcerative (chronic) pancolitis without complications: Secondary | ICD-10-CM

## 2019-12-05 DIAGNOSIS — K219 Gastro-esophageal reflux disease without esophagitis: Secondary | ICD-10-CM | POA: Diagnosis not present

## 2019-12-05 MED ORDER — BALSALAZIDE DISODIUM 750 MG PO CAPS: ORAL_CAPSULE | ORAL | 3 refills | Status: DC

## 2019-12-05 NOTE — Patient Instructions (Signed)
We have sent the following medications to your pharmacy for you to pick up at your convenience: Balsalazide  Decrease carbonated beverages in the evening.  Try Gas-x , take 1-2 at bedtime and see if it helps with belching.  Follow up with Dr Carlean Purl in a year or sooner if needed.    I appreciate the opportunity to care for you. Silvano Rusk, MD, [facility]

## 2019-12-05 NOTE — Progress Notes (Signed)
Timothy Mclean 79 y.o. [birth year] [id number]  Assessment & Plan:   Encounter Diagnoses  Name Primary?  . Ulcerative pancolitis without complication ([place]) Yes  . Barrett's esophagus without dysplasia   . Gastroesophageal reflux disease, unspecified whether esophagitis present     Ulcerative colitis is under control.  The patient chooses not to have surveillance colonoscopy in his age and with his history that is reasonable.  Refill balsalazide for a year.  Reflux and Barrett's seem to be under control.  The belching may be from carbonated beverages he drinks a lot of these apparently.  He will reduce his carbonated beverages in the evening and try simethicone (Gas-X) as needed.  We did review performing an EGD to check the Barrett's esophagus given the cancer risk though low its not 0 in someone.  It seems very unlikely that this belching symptom is related to development of esophageal cancer but I explained that I would not know unless we checked.  He declined proceeding with endoscopy and wanted to see if the simple measures mentioned above would relieve his belching and then if not would call back and reconsider endoscopy.   Return to clinic 1 year  I appreciate the opportunity to care for this patient. CC: Derinda Late, MD     Subjective:   Chief Complaint: Follow-up of ulcerative colitis, GERD plus Barrett's and belching  HPI This 79 year old white man is followed for ulcerative pancolitis and Barrett's esophagus and GERD.  Last seen April 2019.  Only complaint today is belching at night intermittently for the past few months.  He does drink carbonated beverages mostly ginger ale some Mountain Dew according to his wife "a lot".  No diarrhea no symptoms from his ulcerative colitis.  No change in medications.  He has chronic kidney disease with a stable creatinine and GFR.  Labs reviewed and epic.  Last colonoscopy and EGD 2016 no evidence of dysplasia in his Barrett's and  no colitis on colonoscopy.  Wt Readings from Last 3 Encounters:  12/05/19 172 lb 6 oz (78.2 kg)  05/22/19 172 lb (78 kg)  11/14/18 174 lb (78.9 kg)    Allergies  Allergen Reactions  . Atorvastatin Other (See Comments)    Arthralgia, myalgia  . Fluvastatin Other (See Comments)    Arthralgia, myalgias  . Lisinopril Cough  . Metformin And Related Other (See Comments)    Anorexia, diarrhea, nausea, weight loss  . Pravastatin Other (See Comments)    Arthralgia, myalgias  . Simvastatin Other (See Comments)    myalgias  . Spiriva Handihaler [Tiotropium Bromide Monohydrate] Nausea Only   Current Meds  Medication Sig  . ADVAIR DISKUS 250-50 MCG/DOSE AEPB Inhale 1 puff into the lungs 2 (two) times daily.   [name] albuterol (PROVENTIL HFA;VENTOLIN HFA) 108 (90 Base) MCG/ACT inhaler Inhale into the lungs every 6 (six) hours as needed for wheezing or shortness of breath.  [name] apixaban (ELIQUIS) 2.5 MG TABS tablet Take 1 tablet (2.5 mg total) by mouth 2 (two) times daily.  . balsalazide (COLAZAL) 750 MG capsule TAKE 2 CAPSULES BY MOUTH TWICE DAILY  . BAYER CONTOUR TEST test strip   . CRESTOR 20 MG tablet Take 1 tablet by mouth daily.  . cyanocobalamin 2000 MCG tablet Take 2,000 mcg by mouth daily.  [name] diltiazem (CARDIZEM CD) 180 MG 24 hr capsule Take 1 capsule (180 mg total) by mouth daily.  . finasteride (PROSCAR) 5 MG tablet Take 1 tablet by mouth daily.  . furosemide (LASIX) 20  MG tablet Take 20 mg by mouth daily.  [name] glipiZIDE (GLUCOTROL XL) 5 MG 24 hr tablet Take 5 mg by mouth daily.  [name] JANUVIA 50 MG tablet Take 50 mg by mouth daily.  [name] levothyroxine (SYNTHROID, LEVOTHROID) 75 MCG tablet Take 1 tablet by mouth daily. Take 1 tab daily  . metFORMIN (GLUCOPHAGE) 500 MG tablet Take 500 mg by mouth 2 (two) times a day.   . oxybutynin (DITROPAN) 5 MG tablet Take 5 mg by mouth 1 day or 1 dose.   . pantoprazole (PROTONIX) 40 MG tablet Take 1 tablet by mouth 2 (two) times daily.   . vitamin D,  CHOLECALCIFEROL, 400 UNITS tablet Take 400 Units by mouth daily.   Past Medical History:  Diagnosis Date  . Allergy   . Anxiety   . Asthma    since 2013  . Barrett's esophagus 03/09/2014  . Bilateral lumbar radiculopathy    since 2013  . CAD S/P percutaneous coronary angioplasty 1987   a. 1987 angioplasty and BMS- Cx; b. 1997 CABG x 4;  c. MYOVIEW 1/'05: ? Ischemia vs. artifact --> sent for cath - patent grafts.  . Chronic atrial fibrillation ([place])    a. Prev on amiodarone-->d/c 08/2016 in setting of recurrent AF, LFT abnormalities, and pulmonary symptoms;  b. CHA2DS2VASc = 4-->eliquis.  . Chronic bronchitis ([place])    since 2007  . COPD (chronic obstructive pulmonary disease) ([place])   . DDD (degenerative disc disease)   . Diverticulosis   . Dyslipidemia, goal LDL below 70    Doing better off of Zocor, currently on Lescol   . ED (erectile dysfunction)   . Elevated LFTs   . Esophageal reflux   . GERD (gastroesophageal reflux disease)   . Gout, unspecified   . Hearing loss   . Helicobacter pylori gastritis 07/2009  . Herpes zoster 2009  . Hypothyroidism    since 2010  . Iron deficiency anemia, unspecified   . Leg weakness    a. 09/2016 ABI's: R 1.05, L 0.98.  [name] Other and unspecified hyperlipidemia   . Other B-complex deficiencies   . Personal history of colonic polyps   . Prostatitis    chronic ulcerative  . Pulmonary fibrosis ([place])    and bronchiectasis, since 2015  . Renal stone    Right  . S/P CABG x 4 1997   a) LIMA-LAD, SVG to OM, SVG-dRCA-RPL; b) FALSE + MYOVIEW --> CATH 1/'05: 100% LAD after widelly patent D1, all Cx OM branches 100%, pRCA 100%, SVG-PDA patent w/ retrograde filling of RPL, SVG-OM widely patent ~ normal OM, LIMA-LAD patent.  [name] Spinal stenosis   . ST elevation myocardial infarction (STEMI) of inferior wall, subsequent episode of care ([place]) 1987, 1997   a) PTCA of Cx; b) CABG   . Type II or unspecified type diabetes mellitus without mention of  complication, not stated as uncontrolled    no meds  . Ulcerative colitis, unspecified   . Unspecified essential hypertension   . Vitamin D deficiency    Past Surgical History:  Procedure Laterality Date  . BLEPHAROPLASTY Bilateral    for ptosis  . CARDIAC CATHETERIZATION  08/17/2003   "False positive stress test " grafts patent; RCA proximal 100% LAD 100% occlusion after normal D1 with 80% ostial as SP1; circumflex patent but OM1 on to all occluded; EF 50-55%  . CARDIAC CATHETERIZATION  11/1995   Preop CABG: LAD 90% at D1, circumflex 100 and OM a 90% proximal, 80% distal  .  Carotid Duplex Doppler  12/23/2009   Right&Left ICAs 0-49%, mildly abnormal study,   . CHOLECYSTECTOMY    . COLONOSCOPY    . CORONARY ARTERY BYPASS GRAFT  11/1995   LIMA-LAD, SVG to OM, SVG-dRCA-RPL  . ESOPHAGOGASTRODUODENOSCOPY    . FLEXIBLE SIGMOIDOSCOPY    . NM MYOCAR PERF EJECTION FRACTION  07/03/2003   FALSE POSITIVE TEST; Bruce protocol, negative test with scintigraphic evidence of inferoapical scar, diaphragmatic attenuation, moderate ischemia.  [name] NM MYOVIEW LTD  2005   Questionable ischemia that is likely either infarct versus artifact; normal  . TRANSTHORACIC ECHOCARDIOGRAM  12/2017   EF 55-60%.  Unable to assess diastolic function due to A. fib.  Moderate LA/RA dilation.  Mild MR.   Social History   Social History Narrative   He is a married father of one. Does not smoke and does not drink. He walks routinely and also does stationary bike. 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   Review of Systems As above  Objective:   Physical Exam BP 110/78    Pulse 69   Ht 5' 10"  (1.778 m)   Wt 172 lb 6 oz (78.2 kg)   BMI 24.73 kg/m  Anicteric Lungs clear  heart sounds no murmur, they are distant irregular rhythm normal rate Abdomen soft nontender bowel sounds present

## 2019-12-06 DIAGNOSIS — H2512 Age-related nuclear cataract, left eye: Secondary | ICD-10-CM | POA: Diagnosis not present

## 2019-12-08 DIAGNOSIS — H25812 Combined forms of age-related cataract, left eye: Secondary | ICD-10-CM | POA: Diagnosis not present

## 2019-12-19 DIAGNOSIS — Z85828 Personal history of other malignant neoplasm of skin: Secondary | ICD-10-CM | POA: Diagnosis not present

## 2019-12-19 DIAGNOSIS — L570 Actinic keratosis: Secondary | ICD-10-CM | POA: Diagnosis not present

## 2019-12-19 DIAGNOSIS — B078 Other viral warts: Secondary | ICD-10-CM | POA: Diagnosis not present

## 2019-12-19 DIAGNOSIS — X32XXXD Exposure to sunlight, subsequent encounter: Secondary | ICD-10-CM | POA: Diagnosis not present

## 2019-12-19 DIAGNOSIS — L821 Other seborrheic keratosis: Secondary | ICD-10-CM | POA: Diagnosis not present

## 2019-12-19 DIAGNOSIS — Z08 Encounter for follow-up examination after completed treatment for malignant neoplasm: Secondary | ICD-10-CM | POA: Diagnosis not present

## 2020-03-18 ENCOUNTER — Other Ambulatory Visit: Payer: Self-pay

## 2020-03-18 MED ORDER — DILTIAZEM HCL ER COATED BEADS 180 MG PO CP24: 180.0000 mg | ORAL_CAPSULE | Freq: Every day | ORAL | 1 refills | Status: DC

## 2020-03-26 DIAGNOSIS — L570 Actinic keratosis: Secondary | ICD-10-CM | POA: Diagnosis not present

## 2020-03-26 DIAGNOSIS — B078 Other viral warts: Secondary | ICD-10-CM | POA: Diagnosis not present

## 2020-03-26 DIAGNOSIS — X32XXXD Exposure to sunlight, subsequent encounter: Secondary | ICD-10-CM | POA: Diagnosis not present

## 2020-04-16 ENCOUNTER — Other Ambulatory Visit: Payer: Self-pay | Admitting: Family Medicine

## 2020-04-16 ENCOUNTER — Ambulatory Visit
Admission: RE | Admit: 2020-04-16 | Discharge: 2020-04-16 | Disposition: A | Discharge status: Home / Self Care | Payer: PPO | Source: Ambulatory Visit | Attending: Family Medicine | Admitting: Family Medicine

## 2020-04-16 DIAGNOSIS — R634 Abnormal weight loss: Secondary | ICD-10-CM

## 2020-04-16 DIAGNOSIS — J4540 Moderate persistent asthma, uncomplicated: Secondary | ICD-10-CM | POA: Diagnosis not present

## 2020-04-16 DIAGNOSIS — E119 Type 2 diabetes mellitus without complications: Secondary | ICD-10-CM | POA: Diagnosis not present

## 2020-04-16 DIAGNOSIS — R059 Cough, unspecified: Secondary | ICD-10-CM

## 2020-04-16 DIAGNOSIS — I4819 Other persistent atrial fibrillation: Secondary | ICD-10-CM | POA: Diagnosis not present

## 2020-04-16 DIAGNOSIS — I872 Venous insufficiency (chronic) (peripheral): Secondary | ICD-10-CM | POA: Diagnosis not present

## 2020-04-16 DIAGNOSIS — R05 Cough: Secondary | ICD-10-CM | POA: Diagnosis not present

## 2020-05-28 ENCOUNTER — Ambulatory Visit: Payer: PPO | Admitting: Cardiology

## 2020-05-28 ENCOUNTER — Encounter: Payer: Self-pay | Admitting: Cardiology

## 2020-05-28 ENCOUNTER — Other Ambulatory Visit: Payer: Self-pay

## 2020-05-28 VITALS — BP 118/50 | HR 66 | Ht 72.0 in | Wt 167.6 lb

## 2020-05-28 DIAGNOSIS — I10 Essential (primary) hypertension: Secondary | ICD-10-CM | POA: Diagnosis not present

## 2020-05-28 DIAGNOSIS — I4821 Permanent atrial fibrillation: Secondary | ICD-10-CM | POA: Diagnosis not present

## 2020-05-28 DIAGNOSIS — R011 Cardiac murmur, unspecified: Secondary | ICD-10-CM

## 2020-05-28 DIAGNOSIS — I2119 ST elevation (STEMI) myocardial infarction involving other coronary artery of inferior wall: Secondary | ICD-10-CM | POA: Diagnosis not present

## 2020-05-28 DIAGNOSIS — Z7901 Long term (current) use of anticoagulants: Secondary | ICD-10-CM

## 2020-05-28 DIAGNOSIS — I952 Hypotension due to drugs: Secondary | ICD-10-CM

## 2020-05-28 DIAGNOSIS — I4811 Longstanding persistent atrial fibrillation: Secondary | ICD-10-CM

## 2020-05-28 DIAGNOSIS — Z951 Presence of aortocoronary bypass graft: Secondary | ICD-10-CM

## 2020-05-28 DIAGNOSIS — I2510 Atherosclerotic heart disease of native coronary artery without angina pectoris: Secondary | ICD-10-CM

## 2020-05-28 NOTE — Patient Instructions (Addendum)
Medication Instructions:  No changes    *If you need a refill on your cardiac medications before your next appointment, please call your pharmacy*   Lab Work: Please have primary send copy of next  lipid panel    If you have labs (blood work) drawn today and your tests are completely normal, you will receive your results only by: [name] MyChart Message (if you have MyChart) OR . A paper copy in the mail If you have any lab test that is abnormal or we need to change your treatment, we will call you to review the results.   Testing/Procedures: Not neede   Follow-Up: At [facility], you and your health needs are our priority.  As part of our continuing mission to provide you with exceptional heart care, we have created designated Provider Care Teams.  These Care Teams include your primary Cardiologist (physician) and Advanced Practice Providers (APPs -  Physician Assistants and Nurse Practitioners) who all work together to provide you with the care you need, when you need it.  We recommend signing up for the patient portal called "MyChart".  Sign up information is provided on this After Visit Summary.  MyChart is used to connect with patients for Virtual Visits (Telemedicine).  Patients are able to view lab/test results, encounter notes, upcoming appointments, etc.  Non-urgent messages can be sent to your provider as well.   To learn more about what you can do with MyChart, go to [email or web address].    Your next appointment:   12 month(s)  The format for your next appointment:   In Person  Provider:   Glenetta Hew, MD   Other Instructions

## 2020-05-28 NOTE — Progress Notes (Signed)
Primary Care Provider: Derinda Late, MD Cardiologist: Glenetta Hew, MD Electrophysiologist: None  Clinic Note: Chief Complaint  Patient presents with  . Follow-up    Doing well.  No major complaints.  . Atrial Fibrillation    No symptoms.  No bleeding.  . Coronary Artery Disease    No angina    HPI:    Timothy Mclean is a 79 y.o. male with a PMH below who presents today for annual f/u . -- Cardiac Hx: CAD-CABG, Permanent AibF (persistent/Permanent - on Eliquis & Diltiazem).  Problem List Items Addressed This Visit    CAD S/P PTCA-PCI then CABG x4 (Chronic)   S/P CABG x 4 (Chronic)   Essential hypertension (Chronic)   ST elevation myocardial infarction (STEMI) of inferior wall, subsequent episode of care (Timothy Mclean) - Primary (Chronic)   Longstanding persistent atrial fibrillation: CHA2DS2-VASc Score =4, On Eliquis. Diltiazem for rate control. (Chronic)   Hypotension due to drugs   Systolic ejection murmur     Timothy Mclean was last seen on May 23, 2019 doing fairly well.  Still does intermittent walking but does walk with a cane very slowly.  Has not been doing the stationary bike as much.  A little more sedentary.  No sensation of A. fib.  A little more hard of hearing and little worse memory.  Recent Hospitalizations: None  Reviewed  CV studies:    The following studies were reviewed today: (if available, images/films reviewed: From Epic Chart or Care Everywhere) . None:   Interval History:   Timothy Mclean returns here today for annual follow-up doing fairly well pretty stable.  No major issues.  Has occasional cough seeing there, maybe every now and then feels a skipped beat but does not necessarily feel any thing consistent with A. fib.  He still he denies any chest pain or pressure with rest or exertion.  Not doing as much exertion, but is trying to walk most days, but walks slowly with his cane. No exertional dyspnea just gets tired.  No hip heart failure  symptoms of PND, orthopnea, or edema.  Other than feeling a few skipped beats he does not have any sensation of A. fib or fast/slow heart rates.   CV Review of Symptoms (Summary) Cardiovascular ROS: positive for - Occasional skipped beat feeling; he gets short of breath he walks too far or overdoes it, but has been allowing this up to get a little bit more out of shape. negative for - chest pain, edema, orthopnea, palpitations, paroxysmal nocturnal dyspnea, rapid heart rate, shortness of breath or Lightheadedness, dizziness, syncope/near syncope, TIA/amaurosis fugax, claudication; melena, hematochezia, hematuria, epistaxis.  The patient does not have symptoms concerning for COVID-19 infection (fever, chills, cough, or new shortness of breath).   REVIEWED OF SYSTEMS   Review of Systems  Constitutional: Positive for weight loss (Weight does seem less than last visit). Negative for malaise/fatigue (Not a lot of energy, but not fatigue).  HENT: Positive for congestion.   Respiratory: Positive for cough (Off-and-on cough for last month.). Negative for sputum production, shortness of breath and wheezing.   Gastrointestinal: Negative for abdominal pain.  Musculoskeletal: Positive for back pain. Negative for falls and joint pain.  Neurological: Positive for dizziness (If he moves too quick) and weakness (Legs get weak). Negative for tingling, focal weakness and headaches.  Endo/Heme/Allergies: Negative for environmental allergies. Does not bruise/bleed easily.  Psychiatric/Behavioral: Positive for memory loss. The patient is not nervous/anxious and does not have insomnia.  I have reviewed and (if needed) personally updated the patient's problem list, medications, allergies, past medical and surgical history, social and family history.   PAST MEDICAL HISTORY   Past Medical History:  Diagnosis Date  . Allergy   . Anxiety   . Asthma    since 2013  . Barrett's esophagus 03/09/2014  . Bilateral  lumbar radiculopathy    since 2013  . CAD S/P percutaneous coronary angioplasty 1987   a. 1987 angioplasty and BMS- Cx; b. 1997 CABG x 4;  c. MYOVIEW 1/'05: ? Ischemia vs. artifact --> sent for cath - patent grafts.  . Chronic atrial fibrillation ([place])    a. Prev on amiodarone-->d/c 08/2016 in setting of recurrent AF, LFT abnormalities, and pulmonary symptoms;  b. CHA2DS2VASc = 4-->eliquis.  . Chronic bronchitis ([place])    since 2007  . COPD (chronic obstructive pulmonary disease) ([place])   . DDD (degenerative disc disease)   . Diverticulosis   . Dyslipidemia, goal LDL below 70    Doing better off of Zocor, currently on Lescol   . ED (erectile dysfunction)   . Elevated LFTs   . Esophageal reflux   . GERD (gastroesophageal reflux disease)   . Gout, unspecified   . Hearing loss   . Helicobacter pylori gastritis 07/2009  . Herpes zoster 2009  . Hypothyroidism    since 2010  . Iron deficiency anemia, unspecified   . Leg weakness    a. 09/2016 ABI's: R 1.05, L 0.98.  [name] Other and unspecified hyperlipidemia   . Other B-complex deficiencies   . Personal history of colonic polyps   . Prostatitis    chronic ulcerative  . Pulmonary fibrosis ([place])    and bronchiectasis, since 2015  . Renal stone    Right  . S/P CABG x 4 1997   a) LIMA-LAD, SVG to OM, SVG-dRCA-RPL; b) FALSE + MYOVIEW --> CATH 1/'05: 100% LAD after widelly patent D1, all Cx OM branches 100%, pRCA 100%, SVG-PDA patent w/ retrograde filling of RPL, SVG-OM widely patent ~ normal OM, LIMA-LAD patent.  [name] Spinal stenosis   . ST elevation myocardial infarction (STEMI) of inferior wall, subsequent episode of care ([place]) 1987, 1997   a) PTCA of Cx; b) CABG   . Type II or unspecified type diabetes mellitus without mention of complication, not stated as uncontrolled    no meds  . Ulcerative colitis, unspecified   . Unspecified essential hypertension   . Vitamin D deficiency     PAST SURGICAL HISTORY   Past Surgical History:    Procedure Laterality Date  . BLEPHAROPLASTY Bilateral    for ptosis  . CARDIAC CATHETERIZATION  08/17/2003   "False positive stress test " grafts patent; RCA proximal 100% LAD 100% occlusion after normal D1 with 80% ostial as SP1; circumflex patent but OM1 on to all occluded; EF 50-55%  . CARDIAC CATHETERIZATION  11/1995   Preop CABG: LAD 90% at D1, circumflex 100 and OM a 90% proximal, 80% distal  . Carotid Duplex Doppler  12/23/2009   Right&Left ICAs 0-49%, mildly abnormal study,   . CHOLECYSTECTOMY    . COLONOSCOPY    . CORONARY ARTERY BYPASS GRAFT  11/1995   LIMA-LAD, SVG to OM, SVG-dRCA-RPL  . ESOPHAGOGASTRODUODENOSCOPY    . FLEXIBLE SIGMOIDOSCOPY    . NM MYOCAR PERF EJECTION FRACTION  07/03/2003   FALSE POSITIVE TEST; Bruce protocol, negative test with scintigraphic evidence of inferoapical scar, diaphragmatic attenuation, moderate ischemia.  [name] NM MYOVIEW LTD  2005  Questionable ischemia that is likely either infarct versus artifact; normal  . TRANSTHORACIC ECHOCARDIOGRAM  12/2017   EF 55-60%.  Unable to assess diastolic function due to A. fib.  Moderate LA/RA dilation.  Mild MR.    Immunization History  Administered Date(s) Administered  . Influenza Split 05/20/2013  . Pneumococcal Conjugate-13 09/25/2013  . Pneumococcal Polysaccharide-23 07/20/2013, 07/31/2015  . Tdap 06/23/2013  . Zoster 10/27/2007    MEDICATIONS/ALLERGIES   Current Meds  Medication Sig  . ADVAIR DISKUS 250-50 MCG/DOSE AEPB Inhale 1 puff into the lungs 2 (two) times daily.   [name] albuterol (PROVENTIL HFA;VENTOLIN HFA) 108 (90 Base) MCG/ACT inhaler Inhale into the lungs every 6 (six) hours as needed for wheezing or shortness of breath.  [name] apixaban (ELIQUIS) 2.5 MG TABS tablet Take 1 tablet (2.5 mg total) by mouth 2 (two) times daily.  . balsalazide (COLAZAL) 750 MG capsule TAKE 2 CAPSULES BY MOUTH TWICE DAILY  . BAYER CONTOUR TEST test strip   . CRESTOR 20 MG tablet Take 1 tablet by mouth daily.  .  cyanocobalamin 2000 MCG tablet Take 2,000 mcg by mouth daily.  [name] diltiazem (CARDIZEM CD) 180 MG 24 hr capsule Take 1 capsule (180 mg total) by mouth daily.  . finasteride (PROSCAR) 5 MG tablet Take 1 tablet by mouth daily.  . furosemide (LASIX) 20 MG tablet Take 20 mg by mouth daily.  [name] glipiZIDE (GLUCOTROL XL) 5 MG 24 hr tablet Take 5 mg by mouth daily.  [name] JANUVIA 50 MG tablet Take 50 mg by mouth daily.  [name] levothyroxine (SYNTHROID, LEVOTHROID) 75 MCG tablet Take 1 tablet by mouth daily. Take 1 tab daily  . metFORMIN (GLUCOPHAGE) 500 MG tablet Take 500 mg by mouth 2 (two) times a day.   . oxybutynin (DITROPAN) 5 MG tablet Take 5 mg by mouth 1 day or 1 dose.   . pantoprazole (PROTONIX) 40 MG tablet Take 1 tablet by mouth 2 (two) times daily.   . vitamin D, CHOLECALCIFEROL, 400 UNITS tablet Take 400 Units by mouth daily.    Allergies  Allergen Reactions  . Atorvastatin Other (See Comments)    Arthralgia, myalgia  . Fluvastatin Other (See Comments)    Arthralgia, myalgias  . Lisinopril Cough  . Metformin And Related Other (See Comments)    Anorexia, diarrhea, nausea, weight loss  . Pravastatin Other (See Comments)    Arthralgia, myalgias  . Simvastatin Other (See Comments)    myalgias  . Spiriva Handihaler [Tiotropium Bromide Monohydrate] Nausea Only    SOCIAL HISTORY/FAMILY HISTORY   Reviewed in Epic:  Pertinent findings: No change; not eating as much and not as active.  OBJCTIVE -PE, EKG, labs   Wt Readings from Last 3 Encounters:  05/28/20 167 lb 9.6 oz (76 kg)  12/05/19 172 lb 6 oz (78.2 kg)  05/22/19 172 lb (78 kg)   Physical Exam: BP (!) 118/50   Pulse 66   Ht 6' (1.829 m)   Wt 167 lb 9.6 oz (76 kg)   SpO2 99%   BMI 22.73 kg/m  Physical Exam Constitutional:      General: He is not in acute distress.    Appearance: Normal appearance.     Comments: Quite hard of hearing.  Well-groomed.  More kyphosis.  Seems weaker and more frail.  HENT:     Head: Normocephalic  and atraumatic.  Neck:     Vascular: No carotid bruit.  Cardiovascular:     Rate and Rhythm: Normal rate. Rhythm irregularly  irregular.     Chest Wall: PMI is not displaced.     Pulses: Intact distal pulses.     Heart sounds: S1 normal and S2 normal. Heart sounds are distant. Murmur heard.  Harsh crescendo-decrescendo midsystolic murmur is present with a grade of 2/6 at the upper right sternal border radiating to the neck.  No friction rub. No gallop.   Pulmonary:     Effort: Pulmonary effort is normal. No respiratory distress.     Breath sounds: Normal breath sounds.  Chest:     Chest wall: No tenderness.  Musculoskeletal:        General: Swelling (Trivial bilateral) and deformity (More thoracic kyphosis.) present. Normal range of motion.     Cervical back: Normal range of motion. Rigidity (More just stiff neck) present.  Neurological:     General: No focal deficit present.     Mental Status: He is alert and oriented to person, place, and time. Mental status is at baseline.     Gait: Gait abnormal (Slow, deliberate).  Psychiatric:        Mood and Affect: Mood normal.        Behavior: Behavior normal.        Thought Content: Thought content normal.        Judgment: Judgment normal.     Comments: Somewhat slow, deliberate speech      Adult ECG Report  Rate: 66 ;  Rhythm: atrial fibrillation, premature ventricular contractions (PVC) and aberrantly conducted beats. NS ST-T cahnges;   Narrative Interpretation: Stable EKG.  Recent Labs: Reviewed No results found for: CHOL, HDL, LDLCALC, LDLDIRECT, TRIG, CHOLHDL Lab Results  Component Value Date   CREATININE 1.89 (H) 08/21/2019   BUN 26 08/21/2019   NA 145 (H) 08/21/2019   K 4.6 08/21/2019   CL 104 08/21/2019   CO2 23 08/21/2019   Lab Results  Component Value Date   TSH 1.570 12/26/2013    ASSESSMENT/PLAN    Problem List Items Addressed This Visit    CAD S/P PTCA-PCI then CABG x4 (Chronic)    Distant history of CABG.   No ischemic evaluation since 2005, however he had a relatively normal echocardiogram in 2019.  In the absence of any symptoms, I will see any reason to continue with her ischemic evaluation given his age.  He made this agreement with Dr. Rex Kras before he became my patient.  No active anginal symptoms.  No change in medications.   On diltiazem in the beta-blocker because of fatigue and hypotension.  On stable dose of rosuvastatin.  Not on aspirin or Plavix because of Eliquis.  Not on ACE Imdur/ARB because of history of hypotension.        Relevant Orders   EKG 12-Lead (Completed)   S/P CABG x 4 (Chronic)    As noted -> last evaluation 2005 led to a false positive stress test and cath showing patent grafts.  Preserved EF on echo in 2019 with no regional wall motion normalities.  Unless he has any rest symptoms, would not study further.      Relevant Orders   EKG 12-Lead (Completed)   Essential hypertension (Chronic)    Lady no longer hypertensive.  Had low blood pressures with higher dose medications.  Moderate-low-dose diltiazem.  Stable.      ST elevation myocardial infarction (STEMI) of inferior wall, subsequent episode of care [facility]) (Chronic)    Distant history of MI.  No further spells since 89 as far as I can tell.  Had bypass surgery.  Last Myoview and cardiac catheterization was in 2005-likely false positive stress test.  Grafts patent..3  Per discussion with with the patient, no further stress test without significant symptoms..      Relevant Orders   EKG 12-Lead (Completed)   Permanent atrial fibrillation [facility]): CHA2DS2-VASc Score =4, On Eliquis. Diltiazem for rate control. - Primary (Chronic)    Permanent A. fib.  Rate controlled with diltiazem.  Heart rate seems to be stable with reduced dose.  He remains completely asymptomatic.  We previously tried amiodarone, but states he really does not know if he is or is not in A. fib, I decided to stop.  CCB chosen over  beta-blocker because less fatigue.  On reduced dose Eliquis, no major bleeding.       Relevant Orders   EKG 12-Lead (Completed)   Anticoagulant long-term use (Chronic)    He is on Eliquis, reduced dose because of age.  No bleeding issues.  Tolerating well.      Systolic ejection murmur (Chronic)    Nothing dramatic noted on echo in 2019.  I would suspect that he does have mild aortic sclerosis, and also had mild MR.      Hypotension due to drugs    BP medicines have been reduced in the past.  Now he is essentially only on diltiazem at lower dose.  Blood pressures seem to be doing relatively well.          COVID-19 Education: The signs and symptoms of COVID-19 were discussed with the patient and how to seek care for testing (follow up with PCP or arrange E-visit).   The importance of social distancing and COVID-19 vaccination was discussed today. 2 min The patient is practicing social distancing & Masking.   I spent a total of 60mnutes with the patient spent in direct patient consultation.  Additional time spent with chart review  / charting (studies, outside notes, etc): 9 Total Time: 31 min   Current medicines are reviewed at length with the patient today.  (+/- concerns) n/a  This visit occurred during the SARS-CoV-2 public health emergency.  Safety protocols were in place, including screening questions prior to the visit, additional usage of staff PPE, and extensive cleaning of exam room while observing appropriate contact time as indicated for disinfecting solutions.  Notice: This dictation was prepared with Dragon dictation along with smaller phrase technology. Any transcriptional errors that result from this process are unintentional and may not be corrected upon review.  Patient Instructions / Medication Changes & Studies & Tests Ordered   Patient Instructions  Medication Instructions:  No changes    *If you need a refill on your cardiac medications before your  next appointment, please call your pharmacy*   Lab Work: Please have primary send copy of next  lipid panel    If you have labs (blood work) drawn today and your tests are completely normal, you will receive your results only by: .Marland KitchenMyChart Message (if you have MyChart) OR . A paper copy in the mail If you have any lab test that is abnormal or we need to change your treatment, we will call you to review the results.   Testing/Procedures: Not neede   Follow-Up: [facility] and your health needs are our priority.  As part of our continuing mission to provide you with exceptional heart care, we have created designated Provider Care Teams.  These Care Teams include your primary Cardiologist (physician) and Advanced Practice  Providers (APPs -  Physician Assistants and Nurse Practitioners) who all work together to provide you with the care you need, when you need it.  We recommend signing up for the patient portal called "MyChart".  Sign up information is provided on this After Visit Summary.  MyChart is used to connect with patients for Virtual Visits (Telemedicine).  Patients are able to view lab/test results, encounter notes, upcoming appointments, etc.  Non-urgent messages can be sent to your provider as well.   To learn more about what you can do with MyChart, go to [email or web address].    Your next appointment:   12 month(s)  The format for your next appointment:   In Person  Provider:   Glenetta Hew, MD   Other Instructions     Studies Ordered:   Orders Placed This Encounter  Procedures  . EKG 12-Lead     Glenetta Hew, M.D., M.S. Interventional Cardiologist   Pager # [phone] Phone # [phone] [address]. [place], [place] [address]   Thank you for choosing [facility] at [facility]!!

## 2020-06-09 ENCOUNTER — Encounter: Payer: Self-pay | Admitting: Cardiology

## 2020-06-09 NOTE — Assessment & Plan Note (Signed)
BP medicines have been reduced in the past.  Now he is essentially only on diltiazem at lower dose.  Blood pressures seem to be doing relatively well.

## 2020-06-09 NOTE — Assessment & Plan Note (Addendum)
Distant history of CABG.  No ischemic evaluation since 2005, however he had a relatively normal echocardiogram in 2019.  In the absence of any symptoms, I will see any reason to continue with her ischemic evaluation given his age.  He made this agreement with Dr. Rex Kras before he became my patient.  No active anginal symptoms.  No change in medications.   On diltiazem in the beta-blocker because of fatigue and hypotension.  On stable dose of rosuvastatin.  Not on aspirin or Plavix because of Eliquis.  Not on ACE Imdur/ARB because of history of hypotension.

## 2020-06-09 NOTE — Assessment & Plan Note (Signed)
He is on Eliquis, reduced dose because of age.  No bleeding issues.  Tolerating well.

## 2020-06-09 NOTE — Assessment & Plan Note (Signed)
Distant history of MI.  No further spells since 89 as far as I can tell.  Had bypass surgery.  Last Myoview and cardiac catheterization was in 2005-likely false positive stress test.  Grafts patent..3  Per discussion with with the patient, no further stress test without significant symptoms.[name]

## 2020-06-09 NOTE — Assessment & Plan Note (Signed)
As noted -> last evaluation 2005 led to a false positive stress test and cath showing patent grafts.  Preserved EF on echo in 2019 with no regional wall motion normalities.  Unless he has any rest symptoms, would not study further.

## 2020-06-09 NOTE — Assessment & Plan Note (Signed)
Lady no longer hypertensive.  Had low blood pressures with higher dose medications.  Moderate-low-dose diltiazem.  Stable.

## 2020-06-09 NOTE — Assessment & Plan Note (Signed)
Nothing dramatic noted on echo in 2019.  I would suspect that he does have mild aortic sclerosis, and also had mild MR.

## 2020-06-09 NOTE — Assessment & Plan Note (Addendum)
Permanent A. fib.  Rate controlled with diltiazem.  Heart rate seems to be stable with reduced dose.  He remains completely asymptomatic.  We previously tried amiodarone, but states he really does not know if he is or is not in A. fib, I decided to stop.  CCB chosen over beta-blocker because less fatigue.  On reduced dose Eliquis, no major bleeding.

## 2020-07-03 DIAGNOSIS — Z961 Presence of intraocular lens: Secondary | ICD-10-CM | POA: Diagnosis not present

## 2020-07-03 DIAGNOSIS — H04123 Dry eye syndrome of bilateral lacrimal glands: Secondary | ICD-10-CM | POA: Diagnosis not present

## 2020-07-03 DIAGNOSIS — H02403 Unspecified ptosis of bilateral eyelids: Secondary | ICD-10-CM | POA: Diagnosis not present

## 2020-07-03 DIAGNOSIS — E119 Type 2 diabetes mellitus without complications: Secondary | ICD-10-CM | POA: Diagnosis not present

## 2020-08-19 DIAGNOSIS — Z125 Encounter for screening for malignant neoplasm of prostate: Secondary | ICD-10-CM | POA: Diagnosis not present

## 2020-08-28 DIAGNOSIS — N183 Chronic kidney disease, stage 3 unspecified: Secondary | ICD-10-CM | POA: Diagnosis not present

## 2020-08-28 DIAGNOSIS — I2510 Atherosclerotic heart disease of native coronary artery without angina pectoris: Secondary | ICD-10-CM | POA: Diagnosis not present

## 2020-08-28 DIAGNOSIS — I493 Ventricular premature depolarization: Secondary | ICD-10-CM | POA: Diagnosis not present

## 2020-08-28 DIAGNOSIS — E1122 Type 2 diabetes mellitus with diabetic chronic kidney disease: Secondary | ICD-10-CM | POA: Diagnosis not present

## 2020-08-28 DIAGNOSIS — K219 Gastro-esophageal reflux disease without esophagitis: Secondary | ICD-10-CM | POA: Diagnosis not present

## 2020-08-28 DIAGNOSIS — G3184 Mild cognitive impairment, so stated: Secondary | ICD-10-CM | POA: Diagnosis not present

## 2020-08-28 DIAGNOSIS — E039 Hypothyroidism, unspecified: Secondary | ICD-10-CM | POA: Diagnosis not present

## 2020-08-28 DIAGNOSIS — I4819 Other persistent atrial fibrillation: Secondary | ICD-10-CM | POA: Diagnosis not present

## 2020-08-28 DIAGNOSIS — Z0000 Encounter for general adult medical examination without abnormal findings: Secondary | ICD-10-CM | POA: Diagnosis not present

## 2020-09-24 DIAGNOSIS — L820 Inflamed seborrheic keratosis: Secondary | ICD-10-CM | POA: Diagnosis not present

## 2020-09-24 DIAGNOSIS — Z08 Encounter for follow-up examination after completed treatment for malignant neoplasm: Secondary | ICD-10-CM | POA: Diagnosis not present

## 2020-09-24 DIAGNOSIS — D225 Melanocytic nevi of trunk: Secondary | ICD-10-CM | POA: Diagnosis not present

## 2020-09-24 DIAGNOSIS — L570 Actinic keratosis: Secondary | ICD-10-CM | POA: Diagnosis not present

## 2020-09-24 DIAGNOSIS — Z85828 Personal history of other malignant neoplasm of skin: Secondary | ICD-10-CM | POA: Diagnosis not present

## 2020-09-24 DIAGNOSIS — B078 Other viral warts: Secondary | ICD-10-CM | POA: Diagnosis not present

## 2020-09-24 DIAGNOSIS — X32XXXD Exposure to sunlight, subsequent encounter: Secondary | ICD-10-CM | POA: Diagnosis not present

## 2020-10-08 DIAGNOSIS — H903 Sensorineural hearing loss, bilateral: Secondary | ICD-10-CM | POA: Diagnosis not present

## 2020-10-29 DIAGNOSIS — E039 Hypothyroidism, unspecified: Secondary | ICD-10-CM | POA: Diagnosis not present

## 2020-11-21 ENCOUNTER — Telehealth: Payer: Self-pay | Admitting: Cardiology

## 2020-11-21 MED ORDER — APIXABAN 2.5 MG PO TABS: 2.5000 mg | ORAL_TABLET | Freq: Two times a day (BID) | ORAL | 1 refills | Status: DC

## 2020-11-21 NOTE — Telephone Encounter (Signed)
*  STAT* If patient is at the pharmacy, call can be transferred to refill team.   1. Which medications need to be refilled? (please list name of each medication and dose if known) apixaban (ELIQUIS) 2.5 MG TABS tablet  2. Which pharmacy/location (including street and city if local pharmacy) is medication to be sent to? [place], [place]  3. Do they need a 30 day or 90 day supply? 90 day

## 2020-11-21 NOTE — Telephone Encounter (Signed)
Rx(s) sent to pharmacy electronically.  

## 2020-12-31 ENCOUNTER — Telehealth: Payer: Self-pay | Admitting: *Deleted

## 2020-12-31 NOTE — Telephone Encounter (Signed)
Received patient assistance form form patient 's wife.   Information completed ,signed and faxed to [facility] - for Eliquis 2.5 mg  twice a day  #180  refill x 3. WIFE NOTIFIED

## 2021-01-13 NOTE — Telephone Encounter (Signed)
SPOKE TO PATIENT'S WIFE. RN sates a letter received that patient did not meet the 3% expense  for the year. Timothy Mclean states she spoke to representative  and sent the documents requested needed for possible assistance with medication.

## 2021-01-28 ENCOUNTER — Telehealth: Payer: Self-pay | Admitting: Cardiology

## 2021-01-28 MED ORDER — DILTIAZEM HCL ER COATED BEADS 180 MG PO CP24: 180.0000 mg | ORAL_CAPSULE | Freq: Every day | ORAL | 0 refills | Status: DC

## 2021-01-28 NOTE — Telephone Encounter (Signed)
Refill sent to the pharmacy 

## 2021-01-28 NOTE — Telephone Encounter (Signed)
*  STAT* If patient is at the pharmacy, call can be transferred to refill team.   1. Which medications need to be refilled? (please list name of each medication and dose if known)  diltiazem (CARDIZEM CD) 180 MG 24 hr capsule    2. Which pharmacy/location (including street and city if local pharmacy) is medication to be sent to? [facility] - [place], [place]  3. Do they need a 30 day or 90 day supply?  90 day supply

## 2021-03-10 ENCOUNTER — Other Ambulatory Visit: Payer: Self-pay

## 2021-03-10 ENCOUNTER — Ambulatory Visit
Admission: RE | Admit: 2021-03-10 | Discharge: 2021-03-10 | Disposition: A | Discharge status: Home / Self Care | Payer: PPO | Source: Ambulatory Visit | Attending: Family Medicine | Admitting: Family Medicine

## 2021-03-10 ENCOUNTER — Other Ambulatory Visit: Payer: Self-pay | Admitting: Family Medicine

## 2021-03-10 DIAGNOSIS — R059 Cough, unspecified: Secondary | ICD-10-CM

## 2021-03-10 DIAGNOSIS — J4520 Mild intermittent asthma, uncomplicated: Secondary | ICD-10-CM | POA: Diagnosis not present

## 2021-03-10 DIAGNOSIS — K219 Gastro-esophageal reflux disease without esophagitis: Secondary | ICD-10-CM | POA: Diagnosis not present

## 2021-04-25 DIAGNOSIS — E119 Type 2 diabetes mellitus without complications: Secondary | ICD-10-CM | POA: Diagnosis not present

## 2021-04-25 DIAGNOSIS — R634 Abnormal weight loss: Secondary | ICD-10-CM | POA: Diagnosis not present

## 2021-04-25 DIAGNOSIS — U071 COVID-19: Secondary | ICD-10-CM | POA: Diagnosis not present

## 2021-04-25 DIAGNOSIS — J4540 Moderate persistent asthma, uncomplicated: Secondary | ICD-10-CM | POA: Diagnosis not present

## 2021-04-26 ENCOUNTER — Emergency Department (HOSPITAL_COMMUNITY): Payer: PPO

## 2021-04-26 ENCOUNTER — Other Ambulatory Visit: Payer: Self-pay

## 2021-04-26 ENCOUNTER — Inpatient Hospital Stay (HOSPITAL_COMMUNITY)
Admission: EM | Admit: 2021-04-26 | Discharge: 2021-05-05 | DRG: 193 | Disposition: A | Discharge status: Skilled Nursing Facility | Payer: PPO | Attending: Internal Medicine | Admitting: Internal Medicine

## 2021-04-26 ENCOUNTER — Encounter (HOSPITAL_COMMUNITY): Payer: Self-pay | Admitting: Emergency Medicine

## 2021-04-26 DIAGNOSIS — Z825 Family history of asthma and other chronic lower respiratory diseases: Secondary | ICD-10-CM

## 2021-04-26 DIAGNOSIS — J159 Unspecified bacterial pneumonia: Secondary | ICD-10-CM | POA: Diagnosis not present

## 2021-04-26 DIAGNOSIS — E11649 Type 2 diabetes mellitus with hypoglycemia without coma: Secondary | ICD-10-CM | POA: Diagnosis present

## 2021-04-26 DIAGNOSIS — J189 Pneumonia, unspecified organism: Secondary | ICD-10-CM | POA: Diagnosis not present

## 2021-04-26 DIAGNOSIS — E440 Moderate protein-calorie malnutrition: Secondary | ICD-10-CM | POA: Diagnosis present

## 2021-04-26 DIAGNOSIS — Z8616 Personal history of COVID-19: Secondary | ICD-10-CM | POA: Diagnosis not present

## 2021-04-26 DIAGNOSIS — U071 COVID-19: Secondary | ICD-10-CM

## 2021-04-26 DIAGNOSIS — R778 Other specified abnormalities of plasma proteins: Secondary | ICD-10-CM | POA: Diagnosis not present

## 2021-04-26 DIAGNOSIS — Z833 Family history of diabetes mellitus: Secondary | ICD-10-CM

## 2021-04-26 DIAGNOSIS — N400 Enlarged prostate without lower urinary tract symptoms: Secondary | ICD-10-CM | POA: Diagnosis present

## 2021-04-26 DIAGNOSIS — E039 Hypothyroidism, unspecified: Secondary | ICD-10-CM | POA: Diagnosis present

## 2021-04-26 DIAGNOSIS — U099 Post covid-19 condition, unspecified: Secondary | ICD-10-CM | POA: Diagnosis present

## 2021-04-26 DIAGNOSIS — Z823 Family history of stroke: Secondary | ICD-10-CM

## 2021-04-26 DIAGNOSIS — E1165 Type 2 diabetes mellitus with hyperglycemia: Secondary | ICD-10-CM | POA: Diagnosis present

## 2021-04-26 DIAGNOSIS — Z888 Allergy status to other drugs, medicaments and biological substances status: Secondary | ICD-10-CM

## 2021-04-26 DIAGNOSIS — Z7901 Long term (current) use of anticoagulants: Secondary | ICD-10-CM

## 2021-04-26 DIAGNOSIS — Z8249 Family history of ischemic heart disease and other diseases of the circulatory system: Secondary | ICD-10-CM

## 2021-04-26 DIAGNOSIS — Z87891 Personal history of nicotine dependence: Secondary | ICD-10-CM

## 2021-04-26 DIAGNOSIS — Z6822 Body mass index (BMI) 22.0-22.9, adult: Secondary | ICD-10-CM | POA: Diagnosis not present

## 2021-04-26 DIAGNOSIS — E1122 Type 2 diabetes mellitus with diabetic chronic kidney disease: Secondary | ICD-10-CM | POA: Diagnosis not present

## 2021-04-26 DIAGNOSIS — I2510 Atherosclerotic heart disease of native coronary artery without angina pectoris: Secondary | ICD-10-CM | POA: Diagnosis present

## 2021-04-26 DIAGNOSIS — F05 Delirium due to known physiological condition: Secondary | ICD-10-CM | POA: Diagnosis present

## 2021-04-26 DIAGNOSIS — K5190 Ulcerative colitis, unspecified, without complications: Secondary | ICD-10-CM | POA: Diagnosis not present

## 2021-04-26 DIAGNOSIS — J9601 Acute respiratory failure with hypoxia: Secondary | ICD-10-CM | POA: Diagnosis present

## 2021-04-26 DIAGNOSIS — Z743 Need for continuous supervision: Secondary | ICD-10-CM | POA: Diagnosis not present

## 2021-04-26 DIAGNOSIS — I4891 Unspecified atrial fibrillation: Secondary | ICD-10-CM | POA: Diagnosis not present

## 2021-04-26 DIAGNOSIS — Z801 Family history of malignant neoplasm of trachea, bronchus and lung: Secondary | ICD-10-CM

## 2021-04-26 DIAGNOSIS — R059 Cough, unspecified: Secondary | ICD-10-CM | POA: Diagnosis not present

## 2021-04-26 DIAGNOSIS — R0602 Shortness of breath: Secondary | ICD-10-CM

## 2021-04-26 DIAGNOSIS — E871 Hypo-osmolality and hyponatremia: Secondary | ICD-10-CM | POA: Diagnosis not present

## 2021-04-26 DIAGNOSIS — K2270 Barrett's esophagus without dysplasia: Secondary | ICD-10-CM | POA: Diagnosis present

## 2021-04-26 DIAGNOSIS — E861 Hypovolemia: Secondary | ICD-10-CM | POA: Diagnosis not present

## 2021-04-26 DIAGNOSIS — I4811 Longstanding persistent atrial fibrillation: Secondary | ICD-10-CM | POA: Diagnosis not present

## 2021-04-26 DIAGNOSIS — N1832 Chronic kidney disease, stage 3b: Secondary | ICD-10-CM | POA: Diagnosis present

## 2021-04-26 DIAGNOSIS — Z951 Presence of aortocoronary bypass graft: Secondary | ICD-10-CM

## 2021-04-26 DIAGNOSIS — F03A0 Unspecified dementia, mild, without behavioral disturbance, psychotic disturbance, mood disturbance, and anxiety: Secondary | ICD-10-CM | POA: Diagnosis not present

## 2021-04-26 DIAGNOSIS — Z79890 Hormone replacement therapy (postmenopausal): Secondary | ICD-10-CM

## 2021-04-26 DIAGNOSIS — N179 Acute kidney failure, unspecified: Secondary | ICD-10-CM | POA: Diagnosis not present

## 2021-04-26 DIAGNOSIS — J1282 Pneumonia due to coronavirus disease 2019: Secondary | ICD-10-CM | POA: Diagnosis not present

## 2021-04-26 DIAGNOSIS — K219 Gastro-esophageal reflux disease without esophagitis: Secondary | ICD-10-CM | POA: Diagnosis present

## 2021-04-26 DIAGNOSIS — J811 Chronic pulmonary edema: Secondary | ICD-10-CM | POA: Diagnosis not present

## 2021-04-26 DIAGNOSIS — I129 Hypertensive chronic kidney disease with stage 1 through stage 4 chronic kidney disease, or unspecified chronic kidney disease: Secondary | ICD-10-CM | POA: Diagnosis not present

## 2021-04-26 DIAGNOSIS — J9600 Acute respiratory failure, unspecified whether with hypoxia or hypercapnia: Secondary | ICD-10-CM | POA: Diagnosis not present

## 2021-04-26 DIAGNOSIS — R531 Weakness: Secondary | ICD-10-CM | POA: Diagnosis not present

## 2021-04-26 DIAGNOSIS — E785 Hyperlipidemia, unspecified: Secondary | ICD-10-CM | POA: Diagnosis present

## 2021-04-26 DIAGNOSIS — R0603 Acute respiratory distress: Secondary | ICD-10-CM | POA: Diagnosis not present

## 2021-04-26 DIAGNOSIS — R0902 Hypoxemia: Secondary | ICD-10-CM | POA: Diagnosis not present

## 2021-04-26 DIAGNOSIS — Z7984 Long term (current) use of oral hypoglycemic drugs: Secondary | ICD-10-CM

## 2021-04-26 LAB — CBC WITH DIFFERENTIAL/PLATELET
Abs Immature Granulocytes: 0.1 10*3/uL — ABNORMAL HIGH (ref 0.00–0.07)
Basophils Absolute: 0 10*3/uL (ref 0.0–0.1)
Basophils Relative: 0 %
Eosinophils Absolute: 0 10*3/uL (ref 0.0–0.5)
Eosinophils Relative: 0 %
HCT: 42.1 % (ref 39.0–52.0)
Hemoglobin: 13.9 g/dL (ref 13.0–17.0)
Immature Granulocytes: 1 %
Lymphocytes Relative: 6 %
Lymphs Abs: 1 10*3/uL (ref 0.7–4.0)
MCH: 26.5 pg (ref 26.0–34.0)
MCHC: 33 g/dL (ref 30.0–36.0)
MCV: 80.3 fL (ref 80.0–100.0)
Monocytes Absolute: 0.9 10*3/uL (ref 0.1–1.0)
Monocytes Relative: 6 %
Neutro Abs: 14.4 10*3/uL — ABNORMAL HIGH (ref 1.7–7.7)
Neutrophils Relative %: 87 %
Platelets: 200 10*3/uL (ref 150–400)
RBC: 5.24 MIL/uL (ref 4.22–5.81)
RDW: 16.6 % — ABNORMAL HIGH (ref 11.5–15.5)
WBC: 16.4 10*3/uL — ABNORMAL HIGH (ref 4.0–10.5)
nRBC: 0 % (ref 0.0–0.2)

## 2021-04-26 LAB — CBG MONITORING, ED
Glucose-Capillary: 152 mg/dL — ABNORMAL HIGH (ref 70–99)
Glucose-Capillary: 150 mg/dL — ABNORMAL HIGH (ref 70–99)

## 2021-04-26 LAB — COMPREHENSIVE METABOLIC PANEL
ALT: 26 U/L (ref 0–44)
AST: 26 U/L (ref 15–41)
Albumin: 3.6 g/dL (ref 3.5–5.0)
Alkaline Phosphatase: 59 U/L (ref 38–126)
Anion gap: 12 (ref 5–15)
BUN: 28 mg/dL — ABNORMAL HIGH (ref 8–23)
CO2: 22 mmol/L (ref 22–32)
Calcium: 9.4 mg/dL (ref 8.9–10.3)
Chloride: 96 mmol/L — ABNORMAL LOW (ref 98–111)
Creatinine, Ser: 1.56 mg/dL — ABNORMAL HIGH (ref 0.61–1.24)
GFR, Estimated: 44 mL/min — ABNORMAL LOW (ref >60)
Glucose, Bld: 193 mg/dL — ABNORMAL HIGH (ref 70–99)
Potassium: 4.4 mmol/L (ref 3.5–5.1)
Sodium: 130 mmol/L — ABNORMAL LOW (ref 135–145)
Total Bilirubin: 1.3 mg/dL — ABNORMAL HIGH (ref 0.3–1.2)
Total Protein: 6.5 g/dL (ref 6.5–8.1)

## 2021-04-26 LAB — RESP PANEL BY RT-PCR (FLU A&B, COVID) ARPGX2
Influenza A by PCR: NEGATIVE
Influenza B by PCR: NEGATIVE
SARS Coronavirus 2 by RT PCR: POSITIVE — AB

## 2021-04-26 LAB — BRAIN NATRIURETIC PEPTIDE: B Natriuretic Peptide: 118.7 pg/mL — ABNORMAL HIGH (ref 0.0–100.0)

## 2021-04-26 LAB — TROPONIN I (HIGH SENSITIVITY)
Troponin I (High Sensitivity): 18 ng/L — ABNORMAL HIGH (ref <18)
Troponin I (High Sensitivity): 19 ng/L — ABNORMAL HIGH (ref <18)

## 2021-04-26 LAB — HEMOGLOBIN A1C
Hgb A1c MFr Bld: 7.7 % — ABNORMAL HIGH (ref 4.8–5.6)
Mean Plasma Glucose: 174.29 mg/dL

## 2021-04-26 MED ORDER — METHYLPREDNISOLONE SODIUM SUCC 125 MG IJ SOLR
80.0000 mg | Freq: Two times a day (BID) | INTRAMUSCULAR | Status: DC
  Administered 2021-04-26: 80 mg via INTRAVENOUS
  Filled 2021-04-26: qty 2

## 2021-04-26 MED ORDER — SODIUM CHLORIDE 0.9 % IV SOLN
2.0000 g | INTRAVENOUS | Status: AC
  Administered 2021-04-27 – 2021-05-01 (×5): 2 g via INTRAVENOUS
  Filled 2021-04-26 (×5): qty 20

## 2021-04-26 MED ORDER — FUROSEMIDE 20 MG PO TABS
20.0000 mg | ORAL_TABLET | Freq: Every day | ORAL | Status: DC
  Administered 2021-04-26: 20 mg via ORAL
  Filled 2021-04-26: qty 1

## 2021-04-26 MED ORDER — ONDANSETRON HCL 4 MG PO TABS: 4.0000 mg | ORAL_TABLET | Freq: Four times a day (QID) | ORAL | Status: DC | PRN

## 2021-04-26 MED ORDER — METOPROLOL TARTRATE 5 MG/5ML IV SOLN: 2.5000 mg | Freq: Four times a day (QID) | INTRAVENOUS | Status: DC | PRN

## 2021-04-26 MED ORDER — BALSALAZIDE DISODIUM 750 MG PO CAPS
2250.0000 mg | ORAL_CAPSULE | Freq: Two times a day (BID) | ORAL | Status: DC
  Administered 2021-04-26 – 2021-05-05 (×18): 2250 mg via ORAL
  Filled 2021-04-26 (×21): qty 3

## 2021-04-26 MED ORDER — LEVALBUTEROL HCL 1.25 MG/0.5ML IN NEBU
1.2500 mg | INHALATION_SOLUTION | Freq: Four times a day (QID) | RESPIRATORY_TRACT | Status: DC
  Administered 2021-04-26: 1.25 mg via RESPIRATORY_TRACT
  Filled 2021-04-26: qty 0.5

## 2021-04-26 MED ORDER — FINASTERIDE 5 MG PO TABS
5.0000 mg | ORAL_TABLET | Freq: Every day | ORAL | Status: DC
  Administered 2021-04-26 – 2021-05-05 (×10): 5 mg via ORAL
  Filled 2021-04-26 (×10): qty 1

## 2021-04-26 MED ORDER — APIXABAN 2.5 MG PO TABS
2.5000 mg | ORAL_TABLET | Freq: Two times a day (BID) | ORAL | Status: DC
  Administered 2021-04-26 – 2021-05-05 (×18): 2.5 mg via ORAL
  Filled 2021-04-26 (×18): qty 1

## 2021-04-26 MED ORDER — LEVOTHYROXINE SODIUM 50 MCG PO TABS
50.0000 ug | ORAL_TABLET | Freq: Every day | ORAL | Status: DC
  Administered 2021-04-27 – 2021-05-05 (×8): 50 ug via ORAL
  Filled 2021-04-26 (×6): qty 1
  Filled 2021-04-26: qty 2
  Filled 2021-04-26 (×3): qty 1

## 2021-04-26 MED ORDER — ROSUVASTATIN CALCIUM 20 MG PO TABS
20.0000 mg | ORAL_TABLET | Freq: Every day | ORAL | Status: DC
  Administered 2021-04-26 – 2021-05-05 (×10): 20 mg via ORAL
  Filled 2021-04-26 (×11): qty 1

## 2021-04-26 MED ORDER — SODIUM CHLORIDE 0.9 % IV BOLUS
500.0000 mL | Freq: Once | INTRAVENOUS | Status: AC
  Administered 2021-04-26: 500 mL via INTRAVENOUS

## 2021-04-26 MED ORDER — BENZONATATE 100 MG PO CAPS
100.0000 mg | ORAL_CAPSULE | Freq: Three times a day (TID) | ORAL | Status: DC
  Administered 2021-04-26 – 2021-05-05 (×27): 100 mg via ORAL
  Filled 2021-04-26 (×26): qty 1

## 2021-04-26 MED ORDER — AZITHROMYCIN 500 MG PO TABS
500.0000 mg | ORAL_TABLET | Freq: Every day | ORAL | Status: AC
  Administered 2021-04-27 – 2021-05-01 (×5): 500 mg via ORAL
  Filled 2021-04-26 (×2): qty 1
  Filled 2021-04-26: qty 2
  Filled 2021-04-26 (×2): qty 1

## 2021-04-26 MED ORDER — FLUTICASONE FUROATE-VILANTEROL 200-25 MCG/INH IN AEPB: 1.0000 | INHALATION_SPRAY | Freq: Every day | RESPIRATORY_TRACT | Status: DC

## 2021-04-26 MED ORDER — SODIUM CHLORIDE 0.9 % IV SOLN
200.0000 mg | Freq: Once | INTRAVENOUS | Status: AC
  Administered 2021-04-26: 200 mg via INTRAVENOUS
  Filled 2021-04-26: qty 40

## 2021-04-26 MED ORDER — ONDANSETRON HCL 4 MG/2ML IJ SOLN
4.0000 mg | Freq: Four times a day (QID) | INTRAMUSCULAR | Status: DC | PRN
  Administered 2021-04-30 – 2021-05-01 (×2): 4 mg via INTRAVENOUS
  Filled 2021-04-26 (×2): qty 2

## 2021-04-26 MED ORDER — SODIUM CHLORIDE 0.9 % IV SOLN
500.0000 mg | INTRAVENOUS | Status: DC
  Administered 2021-04-26: 500 mg via INTRAVENOUS
  Filled 2021-04-26: qty 500

## 2021-04-26 MED ORDER — ACETAMINOPHEN 325 MG PO TABS
650.0000 mg | ORAL_TABLET | Freq: Once | ORAL | Status: AC
  Administered 2021-04-26: 650 mg via ORAL
  Filled 2021-04-26: qty 2

## 2021-04-26 MED ORDER — GUAIFENESIN ER 600 MG PO TB12
1200.0000 mg | ORAL_TABLET | Freq: Two times a day (BID) | ORAL | Status: DC
  Administered 2021-04-26 – 2021-05-05 (×19): 1200 mg via ORAL
  Filled 2021-04-26 (×19): qty 2

## 2021-04-26 MED ORDER — SENNOSIDES-DOCUSATE SODIUM 8.6-50 MG PO TABS: 1.0000 | ORAL_TABLET | Freq: Every evening | ORAL | Status: DC | PRN

## 2021-04-26 MED ORDER — DILTIAZEM HCL ER COATED BEADS 180 MG PO CP24
180.0000 mg | ORAL_CAPSULE | Freq: Every day | ORAL | Status: DC
  Filled 2021-04-26: qty 1

## 2021-04-26 MED ORDER — ACETAMINOPHEN 325 MG PO TABS: 650.0000 mg | ORAL_TABLET | Freq: Four times a day (QID) | ORAL | Status: DC | PRN

## 2021-04-26 MED ORDER — LINAGLIPTIN 5 MG PO TABS
5.0000 mg | ORAL_TABLET | Freq: Every day | ORAL | Status: DC
  Administered 2021-04-27 – 2021-05-05 (×9): 5 mg via ORAL
  Filled 2021-04-26 (×10): qty 1

## 2021-04-26 MED ORDER — INSULIN ASPART 100 UNIT/ML IJ SOLN
0.0000 [IU] | Freq: Three times a day (TID) | INTRAMUSCULAR | Status: DC
  Administered 2021-04-26: 2 [IU] via SUBCUTANEOUS

## 2021-04-26 MED ORDER — FLUTICASONE FUROATE-VILANTEROL 200-25 MCG/INH IN AEPB
1.0000 | INHALATION_SPRAY | Freq: Every day | RESPIRATORY_TRACT | Status: DC
  Administered 2021-04-26 – 2021-05-05 (×9): 1 via RESPIRATORY_TRACT
  Filled 2021-04-26: qty 28

## 2021-04-26 MED ORDER — PANTOPRAZOLE SODIUM 40 MG PO TBEC
40.0000 mg | DELAYED_RELEASE_TABLET | Freq: Two times a day (BID) | ORAL | Status: DC
  Administered 2021-04-26 – 2021-05-05 (×19): 40 mg via ORAL
  Filled 2021-04-26 (×17): qty 1

## 2021-04-26 MED ORDER — ACETAMINOPHEN 650 MG RE SUPP: 650.0000 mg | Freq: Four times a day (QID) | RECTAL | Status: DC | PRN

## 2021-04-26 MED ORDER — SODIUM CHLORIDE 0.9 % IV SOLN: 100.0000 mg | Freq: Every day | INTRAVENOUS | Status: DC

## 2021-04-26 MED ORDER — SODIUM CHLORIDE 0.9 % IV SOLN: INTRAVENOUS | Status: DC

## 2021-04-26 MED ORDER — DILTIAZEM HCL 60 MG PO TABS
60.0000 mg | ORAL_TABLET | Freq: Three times a day (TID) | ORAL | Status: DC
  Administered 2021-04-26 – 2021-05-05 (×28): 60 mg via ORAL
  Filled 2021-04-26 (×31): qty 1

## 2021-04-26 MED ORDER — VITAMIN B-12 1000 MCG PO TABS
2000.0000 ug | ORAL_TABLET | Freq: Every day | ORAL | Status: DC
  Administered 2021-04-26 – 2021-05-05 (×10): 2000 ug via ORAL
  Filled 2021-04-26 (×11): qty 2

## 2021-04-26 MED ORDER — SODIUM CHLORIDE 0.9 % IV SOLN
2.0000 g | INTRAVENOUS | Status: DC
  Administered 2021-04-26: 2 g via INTRAVENOUS
  Filled 2021-04-26: qty 20

## 2021-04-26 MED ORDER — OXYBUTYNIN CHLORIDE 5 MG PO TABS
5.0000 mg | ORAL_TABLET | Freq: Every day | ORAL | Status: DC
  Administered 2021-04-26 – 2021-05-05 (×10): 5 mg via ORAL
  Filled 2021-04-26 (×10): qty 1

## 2021-04-26 NOTE — ED Triage Notes (Signed)
Wife reported persistent productive cough this evening with chest congestion , mild SOB and upper back pain .

## 2021-04-26 NOTE — ED Provider Notes (Signed)
Emergency Medicine Provider Triage Evaluation Note  Timothy Mclean , a 80 y.o. male  was evaluated in triage.  Pt complains of no symptoms currently.  He does have some history of dementia.  His wife states that he has been coughing over the past 3 weeks he was diagnosed with COVID-19 either the 15th or 16th of last month has been coughing since.  His wife states that he has been coughing more over the past day or 2 however.  Has been productive of yellow phlegm.  Patient denies any chest pain or shortness of breath.  Review of Systems  Positive: Cough Negative: Chest pain  Physical Exam  BP 129/64 (BP Location: Right Arm)   Pulse (!) 103   Temp 99.6 F (37.6 C) (Oral)   Resp 17   SpO2 93%  Gen:   Awake, no distress  Resp:  Normal effort  MSK:   Moves extremities without difficulty  Other:  No lower extremity edema.  Medical Decision Making  Medically screening exam initiated at 3:42 AM.  Appropriate orders placed.  [name] was informed that the remainder of the evaluation will be completed by another provider, this initial triage assessment does not replace that evaluation, and the importance of remaining in the ED until their evaluation is complete.  80 year old male presented today with cough temperature 99.6 mildly tachycardic suspect low-grade temperature elevation may be due to other viral process we will test for influenza with swab.  We will also obtain basic labs troponin for atypical ACS and EKG.  Some concern for post-COVID/postviral pneumonia.  Chest x-ray pending   Tedd Sias, [place] 04/26/21 0345    Fatima Blank, MD 04/27/21 [phone]

## 2021-04-26 NOTE — ED Provider Notes (Signed)
[facility] EMERGENCY DEPARTMENT Provider Note   CSN: [id number] Arrival date & time: 04/26/21  0324     History Chief Complaint  Patient presents with   Cough    Timothy Mclean is a 80 y.o. male.  HPI Patient being evaluated today for worsening cough, with weakness, and leaking urine when coughing.  He had a COVID infection diagnosed and treated at home, on 04/03/2021.  He followed up with his PCP, 1 week ago and was started on albuterol.  He had a follow-up appointment yesterday and was again maintained on albuterol because of the persistent cough.  His wife states he is getting weaker and requiring more help to get around.  He has not complained of chest pain.  He has been eating, "okay," but a little less than usual.  He was vaccinated against COVID and had a booster.  There are no other known active modifying factors.    Past Medical History:  Diagnosis Date   Allergy    Anxiety    Asthma    since 2013   Barrett's esophagus 03/09/2014   Bilateral lumbar radiculopathy    since 2013   CAD S/P percutaneous coronary angioplasty 1987   a. 1987 angioplasty and BMS- Cx; b. 1997 CABG x 4;  c. MYOVIEW 1/'05: ? Ischemia vs. artifact --> sent for cath - patent grafts.   Chronic atrial fibrillation ([place])    a. Prev on amiodarone-->d/c 08/2016 in setting of recurrent AF, LFT abnormalities, and pulmonary symptoms;  b. CHA2DS2VASc = 4-->eliquis.   Chronic bronchitis ([place])    since 2007   COPD (chronic obstructive pulmonary disease) (HCC)    DDD (degenerative disc disease)    Diverticulosis    Dyslipidemia, goal LDL below 70    Doing better off of Zocor, currently on Lescol    ED (erectile dysfunction)    Elevated LFTs    Esophageal reflux    GERD (gastroesophageal reflux disease)    Gout, unspecified    Hearing loss    Helicobacter pylori gastritis 07/2009   Herpes zoster 2009   Hypothyroidism    since 2010   Iron deficiency anemia, unspecified    Leg weakness     a. 09/2016 ABI's: R 1.05, L 0.98.   Other and unspecified hyperlipidemia    Other B-complex deficiencies    Personal history of colonic polyps    Prostatitis    chronic ulcerative   Pulmonary fibrosis (HCC)    and bronchiectasis, since 2015   Renal stone    Right   S/P CABG x 4 1997   a) LIMA-LAD, SVG to OM, SVG-dRCA-RPL; b) FALSE + MYOVIEW --> CATH 1/'05: 100% LAD after widelly patent D1, all Cx OM branches 100%, pRCA 100%, SVG-PDA patent w/ retrograde filling of RPL, SVG-OM widely patent ~ normal OM, LIMA-LAD patent.   Spinal stenosis    ST elevation myocardial infarction (STEMI) of inferior wall, subsequent episode of care ([place]) 1987, 1997   a) PTCA of Cx; b) CABG    Type II or unspecified type diabetes mellitus without mention of complication, not stated as uncontrolled    no meds   Ulcerative colitis, unspecified    Unspecified essential hypertension    Vitamin D deficiency     Patient Active Problem List   Diagnosis Date Noted   Community acquired pneumonia 04/26/2021   PNA (pneumonia) 04/26/2021   COVID-19 virus infection    Leg swelling 54/56/2563   Systolic ejection murmur 89/37/3428  Weakness of lower extremity 09/07/2016   Barrett's esophagus 03/09/2014   Hypotension due to drugs 03/06/2014   Emphysema of lung ([place]) 02/19/2014   Cough 02/19/2014   H/O amiodarone therapy 02/16/2013   S/P CABG x 4    Permanent atrial fibrillation [facility]): CHA2DS2-VASc Score =4, On Eliquis. Diltiazem for rate control.    Hyperlipidemia associated with type 2 diabetes mellitus ([place])    Personal history of colonic polyps 09/10/2011   UC (ulcerative colitis) ([employer]) 09/10/2011   Anticoagulant long-term use 09/10/2011   VITAMIN B12 DEFICIENCY 01/30/2009   Type 2 diabetes mellitus with complication - CAD 02/77/4128   GOUT 10/31/2007   Essential hypertension 10/31/2007   ACID REFLUX DISEASE 10/31/2007   CAD S/P PTCA-PCI then CABG x4 12/01/1995   ST elevation myocardial infarction  (STEMI) of inferior wall, subsequent episode of care [facility]) 10/30/1985    Past Surgical History:  Procedure Laterality Date   BLEPHAROPLASTY Bilateral    for ptosis   CARDIAC CATHETERIZATION  08/17/2003   "False positive stress test " grafts patent; RCA proximal 100% LAD 100% occlusion after normal D1 with 80% ostial as SP1; circumflex patent but OM1 on to all occluded; EF 50-55%   CARDIAC CATHETERIZATION  11/1995   Preop CABG: LAD 90% at D1, circumflex 100 and OM a 90% proximal, 80% distal   Carotid Duplex Doppler  12/23/2009   Right&Left ICAs 0-49%, mildly abnormal study,    CHOLECYSTECTOMY     COLONOSCOPY     CORONARY ARTERY BYPASS GRAFT  11/1995   LIMA-LAD, SVG to OM, SVG-dRCA-RPL   ESOPHAGOGASTRODUODENOSCOPY     FLEXIBLE SIGMOIDOSCOPY     NM MYOCAR PERF EJECTION FRACTION  07/03/2003   FALSE POSITIVE TEST; Bruce protocol, negative test with scintigraphic evidence of inferoapical scar, diaphragmatic attenuation, moderate ischemia.   NM MYOVIEW LTD  2005   Questionable ischemia that is likely either infarct versus artifact; normal   TRANSTHORACIC ECHOCARDIOGRAM  12/2017   EF 55-60%.  Unable to assess diastolic function due to A. fib.  Moderate LA/RA dilation.  Mild MR.       Family History  Problem Relation Age of Onset   Diabetes Mother    Congestive Heart Failure Mother    Dementia Father    Pneumonia Father        died of this at age 30, hx of bronchiectasis   Heart disease Sister    Diabetes Sister    Lung cancer Brother        died at age 60   Hypertension Sister    CVA Sister    Colon cancer Neg Hx    Esophageal cancer Neg Hx    Rectal cancer Neg Hx    Stomach cancer Neg Hx     Social History   Tobacco Use   Smoking status: Former    Packs/day: 1.00    Years: 32.00    Pack years: 32.00    Types: Cigarettes    Quit date: 08/27/1984    Years since quitting: 36.6   Smokeless tobacco: Never  Substance Use Topics   Alcohol use: No   Drug use: No    Home  Medications Prior to Admission medications   Medication Sig Start Date End Date Taking? Authorizing Provider  ADVAIR DISKUS 250-50 MCG/DOSE AEPB Inhale 1 puff into the lungs 2 (two) times daily.  08/16/16  Yes [provider]  albuterol (PROVENTIL HFA;VENTOLIN HFA) 108 (90 Base) MCG/ACT inhaler Inhale into the lungs every 6 (six) hours as  needed for wheezing or shortness of breath.   Yes [provider]  apixaban (ELIQUIS) 2.5 MG TABS tablet Take 1 tablet (2.5 mg total) by mouth 2 (two) times daily. 11/21/20  Yes Leonie Man, MD  balsalazide (COLAZAL) 750 MG capsule TAKE 2 CAPSULES BY MOUTH TWICE DAILY Patient taking differently: Take 2,250 mg by mouth in the morning and at bedtime. 12/05/19  Yes Gatha Mayer, MD  Cholecalciferol (VITAMIN D3) 50 MCG (2000 UT) capsule Take 2,000 Units by mouth in the morning and at bedtime.   Yes [provider]  cyanocobalamin 1000 MCG tablet Take 1,000 mcg by mouth in the morning and at bedtime.   Yes [provider]  diltiazem (CARDIZEM CD) 180 MG 24 hr capsule Take 1 capsule (180 mg total) by mouth daily. 01/28/21 01/23/22 Yes Leonie Man, MD  ezetimibe (ZETIA) 10 MG tablet Take 10 mg by mouth daily. 04/13/21  Yes [provider]  finasteride (PROSCAR) 5 MG tablet Take 5 mg by mouth daily. 04/14/17  Yes [provider]  furosemide (LASIX) 20 MG tablet Take 20 mg by mouth daily. 11/07/18  Yes [provider]  glipiZIDE (GLUCOTROL XL) 5 MG 24 hr tablet Take 5 mg by mouth daily. 10/06/18  Yes [provider]  levothyroxine (SYNTHROID) 50 MCG tablet Take 50 mcg by mouth daily. 03/24/21  Yes [provider]  oxybutynin (DITROPAN) 5 MG tablet Take 5 mg by mouth daily. 10/02/11  Yes [provider]  pantoprazole (PROTONIX) 40 MG tablet Take 1 tablet by mouth 2 (two) times daily.  09/07/14  Yes [provider]  rosuvastatin (CRESTOR) 40 MG tablet Take 1 tablet by mouth  daily. 07/23/14  Yes [provider]  BAYER CONTOUR TEST test strip  05/07/14   [provider]    Allergies    Atorvastatin, Fluvastatin, Lisinopril, Metformin and related, Pravastatin, Simvastatin, and Spiriva handihaler [tiotropium bromide monohydrate]  Review of Systems   Review of Systems  Unable to perform ROS: Dementia   Physical Exam Updated Vital Signs BP 120/67   Pulse (!) 116   Temp 98.5 F (36.9 C) (Oral)   Resp 16   SpO2 90%   Physical Exam Vitals and nursing note reviewed.  Constitutional:      General: He is not in acute distress.    Appearance: He is well-developed. He is not ill-appearing.  HENT:     Head: Normocephalic and atraumatic.     Right Ear: External ear normal.     Left Ear: External ear normal.  Eyes:     Conjunctiva/sclera: Conjunctivae normal.     Pupils: Pupils are equal, round, and reactive to light.  Neck:     Trachea: Phonation normal.  Cardiovascular:     Rate and Rhythm: Normal rate.  Pulmonary:     Effort: Pulmonary effort is normal. No respiratory distress.     Breath sounds: No stridor.     Comments: Persistent cough, occasionally productive Abdominal:     General: There is no distension.  Musculoskeletal:        General: Normal range of motion.     Cervical back: Normal range of motion and neck supple.  Skin:    General: Skin is warm and dry.     Coloration: Skin is not jaundiced or pale.  Neurological:     Mental Status: He is alert.     Cranial Nerves: No cranial nerve deficit.     Motor: No abnormal muscle  tone.     Coordination: Coordination normal.  Psychiatric:        Mood and Affect: Mood normal.        Behavior: Behavior normal.    ED Results / Procedures / Treatments   Labs (all labs ordered are listed, but only abnormal results are displayed) Labs Reviewed  RESP PANEL BY RT-PCR (FLU A&B, COVID) ARPGX2 - Abnormal; Notable for the following components:      Result Value   SARS Coronavirus 2  by RT PCR POSITIVE (*)    All other components within normal limits  CBC WITH DIFFERENTIAL/PLATELET - Abnormal; Notable for the following components:   WBC 16.4 (*)    RDW 16.6 (*)    Neutro Abs 14.4 (*)    Abs Immature Granulocytes 0.10 (*)    All other components within normal limits  COMPREHENSIVE METABOLIC PANEL - Abnormal; Notable for the following components:   Sodium 130 (*)    Chloride 96 (*)    Glucose, Bld 193 (*)    BUN 28 (*)    Creatinine, Ser 1.56 (*)    Total Bilirubin 1.3 (*)    GFR, Estimated 44 (*)    All other components within normal limits  HEMOGLOBIN A1C - Abnormal; Notable for the following components:   Hgb A1c MFr Bld 7.7 (*)    All other components within normal limits  TROPONIN I (HIGH SENSITIVITY) - Abnormal; Notable for the following components:   Troponin I (High Sensitivity) 18 (*)    All other components within normal limits  TROPONIN I (HIGH SENSITIVITY) - Abnormal; Notable for the following components:   Troponin I (High Sensitivity) 19 (*)    All other components within normal limits  CULTURE, BLOOD (ROUTINE X 2)  CULTURE, BLOOD (ROUTINE X 2)  EXPECTORATED SPUTUM ASSESSMENT W GRAM STAIN, RFLX TO RESP C  LEGIONELLA PNEUMOPHILA SEROGP 1 UR AG  STREP PNEUMONIAE URINARY ANTIGEN  BRAIN NATRIURETIC PEPTIDE    EKG EKG Interpretation  Date/Time:  Saturday April 26 2021 03:40:21 EDT Ventricular Rate:  106 PR Interval:    QRS Duration: 96 QT Interval:  336 QTC Calculation: 446 R Axis:   60 Text Interpretation: Atrial fibrillation with rapid ventricular response with premature ventricular or aberrantly conducted complexes ST & T wave abnormality, consider inferior ischemia Abnormal ECG Since last tracing Non-specific ST-t changes are present Confirmed by Daleen Bo [id number] on 04/26/2021 1:09:38 PM  Radiology DG Chest 2 View  Result Date: 04/26/2021 CLINICAL DATA:  80 year old male with cough for 3 weeks and congestion. Positive for  COVID-19 last month. EXAM: CHEST - 2 VIEW COMPARISON:  03/10/2021 and earlier. FINDINGS: AP and lateral views. Stable chronically large lung volumes. Stable cardiac size and mediastinal contours. Prior CABG. Visualized tracheal air column is within normal limits. No pneumothorax. No pulmonary edema or pleural effusion. Patchy and nodular bibasilar pulmonary opacity is new since August. No consolidation. Stable visualized osseous structures. Negative visible bowel gas. IMPRESSION: New patchy and nodular bibasilar pulmonary opacity since August, compatible with bilateral pneumonia. Aspiration also a consideration. No pleural effusion. Underlying chronic pulmonary hyperinflation. Electronically Signed   By: Genevie Ann M.D.   On: 04/26/2021 04:46    Procedures .Critical Care Performed by: Daleen Bo, MD Authorized by: Daleen Bo, MD   Critical care provider statement:    Critical care time (minutes):  35   Critical care start time:  04/26/2021 1:02 PM   Critical care end time:  04/26/2021 2:22 PM  Critical care time was exclusive of:  Separately billable procedures and treating other patients   Critical care was necessary to treat or prevent imminent or life-threatening deterioration of the following conditions:  Respiratory failure   Critical care was time spent personally by me on the following activities:  Blood draw for specimens, development of treatment plan with patient or surrogate, discussions with consultants, evaluation of patient's response to treatment, examination of patient, obtaining history from patient or surrogate, ordering and performing treatments and interventions, ordering and review of laboratory studies, pulse oximetry, re-evaluation of patient's condition, review of old charts and ordering and review of radiographic studies   Medications Ordered in ED Medications  remdesivir 200 mg in sodium chloride 0.9% 250 mL IVPB (has no administration in time range)  cefTRIAXone  (ROCEPHIN) 2 g in sodium chloride 0.9 % 100 mL IVPB (0 g Intravenous Stopped 04/26/21 1435)  azithromycin (ZITHROMAX) 500 mg in sodium chloride 0.9 % 250 mL IVPB (500 mg Intravenous New Bag/Given 04/26/21 1440)  0.9 %  sodium chloride infusion (has no administration in time range)  rosuvastatin (CRESTOR) tablet 20 mg (has no administration in time range)  furosemide (LASIX) tablet 20 mg (has no administration in time range)  linagliptin (TRADJENTA) tablet 5 mg (has no administration in time range)  levothyroxine (SYNTHROID) tablet 50 mcg (has no administration in time range)  balsalazide (COLAZAL) capsule 2,250 mg (has no administration in time range)  pantoprazole (PROTONIX) EC tablet 40 mg (has no administration in time range)  finasteride (PROSCAR) tablet 5 mg (has no administration in time range)  oxybutynin (DITROPAN) tablet 5 mg (has no administration in time range)  apixaban (ELIQUIS) tablet 2.5 mg (has no administration in time range)  vitamin B-12 (CYANOCOBALAMIN) tablet 2,000 mcg (has no administration in time range)  fluticasone furoate-vilanterol (BREO ELLIPTA) 200-25 MCG/INH 1 puff (has no administration in time range)  insulin aspart (novoLOG) injection 0-9 Units (has no administration in time range)  cefTRIAXone (ROCEPHIN) 2 g in sodium chloride 0.9 % 100 mL IVPB (has no administration in time range)  azithromycin (ZITHROMAX) tablet 500 mg (has no administration in time range)  acetaminophen (TYLENOL) tablet 650 mg (has no administration in time range)    Or  acetaminophen (TYLENOL) suppository 650 mg (has no administration in time range)  senna-docusate (Senokot-S) tablet 1 tablet (has no administration in time range)  ondansetron (ZOFRAN) tablet 4 mg (has no administration in time range)    Or  ondansetron (ZOFRAN) injection 4 mg (has no administration in time range)  levalbuterol (XOPENEX) nebulizer solution 1.25 mg (has no administration in time range)  guaiFENesin (MUCINEX)  12 hr tablet 1,200 mg (has no administration in time range)  benzonatate (TESSALON) capsule 100 mg (has no administration in time range)  metoprolol tartrate (LOPRESSOR) injection 2.5 mg (has no administration in time range)  diltiazem (CARDIZEM) tablet 60 mg (has no administration in time range)  acetaminophen (TYLENOL) tablet 650 mg (650 mg Oral Given 04/26/21 1404)  sodium chloride 0.9 % bolus 500 mL (500 mLs Intravenous New Bag/Given 04/26/21 1436)    ED Course  I have reviewed the triage vital signs and the nursing notes.  Pertinent labs & imaging results that were available during my care of the patient were reviewed by me and considered in my medical decision making (see chart for details).  Clinical Course as of 04/26/21 1544  Sat Apr 26, 2021  0455 SARS Coronavirus 2 by RT PCR(!): POSITIVE This was diagnosed last month  Flu A/B Negative [WF]    Clinical Course User Index [WF] Tedd Sias, [place]   MDM Rules/Calculators/A&P                            Patient Vitals for the past 24 hrs:  BP Temp Temp src Pulse Resp SpO2  04/26/21 1515 120/67 -- -- (!) 116 -- 90 %  04/26/21 1500 136/78 -- -- (!) 116 16 (!) 88 %  04/26/21 1445 137/67 -- -- (!) 107 (!) 29 90 %  04/26/21 1430 (!) 143/60 -- -- (!) 104 (!) 21 --  04/26/21 1415 135/69 -- -- (!) 110 (!) 24 95 %  04/26/21 1400 131/61 -- -- (!) 110 18 93 %  04/26/21 1349 -- -- -- -- -- 92 %  04/26/21 1348 -- -- -- (!) 103 18 (!) 87 %  04/26/21 1347 -- -- -- (!) 109 (!) 23 (!) 87 %  04/26/21 1346 -- -- -- (!) 122 (!) 33 (!) 88 %  04/26/21 1345 (!) 116/56 -- -- (!) 112 (!) 31 (!) 87 %  04/26/21 1330 137/68 -- -- (!) 109 (!) 30 92 %  04/26/21 1315 (!) 129/97 -- -- (!) 101 (!) 28 91 %  04/26/21 1314 (!) 140/125 -- -- 98 15 92 %  04/26/21 0905 115/62 98.5 F (36.9 C) Oral (!) 106 18 94 %  04/26/21 0650 128/66 -- -- 100 16 93 %  04/26/21 0332 129/64 99.6 F (37.6 C) Oral (!) 103 17 93 %    2:23 PM Reevaluation with update and  discussion. After initial assessment and treatment, an updated evaluation reveals no change in clinical status, findings discussed and questions answered. Daleen Bo   Medical Decision Making:  This patient is presenting for evaluation of cough after COVID infection, which does require a range of treatment options, and is a complaint that involves a high risk of morbidity and mortality. The differential diagnoses include prolonged COVID, secondary bacterial infection, progression of cardiac disease. I decided to review old records, and in summary Ehly male with history of diabetes, coronary artery disease with STEMI and stenting.  Also history of atrial fibrillation.  I obtained additional historical information from wife at bedside.  Clinical Laboratory Tests Ordered, included CBC, Metabolic panel, and delta troponin, viral panel . Review indicates normal except stable mildly elevated high-sensitivity troponin, elevated white count, sodium low, chloride low, glucose high, BUN high, creatinine high, total bilirubin high, COVID PCR positive. Radiologic Tests Ordered, included chest x-ray.  I independently Visualized: Radiograph images, which show bilateral infiltrates, not typical of COVID  Cardiac Monitor Tracing which shows atrial fibrillation, RVR    Critical Interventions-clinical evaluation, laboratory testing, radiography, consultation with pharmacist, medication started, observation and reassessment  After These Interventions, the Patient was reevaluated and was found with pneumonia, likely secondary to COVID infection.  Mild elevation of high-sensitivity troponin suggesting COVID cardiomyopathy.  EKG shows ongoing atrial fibrillation with mild tachycardia.  IV fluids ordered for resuscitation.  He is anticoagulated.  He takes oral diltiazem, which should be adequate for rate control.  CRITICAL CARE-yes Performed by: Daleen Bo   Nursing Notes Reviewed/ Care  Coordinated Applicable Imaging Reviewed Interpretation of Laboratory Data incorporated into ED treatment   2:23 PM-Consult complete with hospitalist. Patient case explained and discussed.  He agrees to admit patient for further evaluation and treatment. Call ended at 2:40 PM    Final Clinical Impression(s) / ED Diagnoses Final  diagnoses:  Community acquired pneumonia, unspecified laterality  COVID-19 virus infection  Elevated troponin  Longstanding persistent atrial fibrillation ([place])    Rx / DC Orders ED Discharge Orders     None        Daleen Bo, MD 04/26/21 1544

## 2021-04-26 NOTE — Progress Notes (Signed)
D/W Pulm attending, agreed to continue high flow, with admission to PCU.  Explained to patient and his wife at bedside, both expressed understanding and agreed.

## 2021-04-26 NOTE — ED Notes (Signed)
Wife states that she is leaving for the night and left clean clothes in the patients room.

## 2021-04-26 NOTE — H&P (Signed)
History and Physical    Timothy Mclean WIO:[id number] DOB: 06-24-1941 DOA: 04/26/2021  PCP: Derinda Late, MD (Confirm with patient/family/NH records and if not entered, this has to be entered at [facility] point of entry) Patient coming from: Home  I have personally briefly reviewed patient's old medical records in [place]  Chief Complaint: Cough, SOB  HPI: Timothy Mclean is a 80 y.o. male with medical history significant of HTN, chronic A. fib, CAD s/p CABG 1997, IIDM, asthma/COPD, GERD/Barrett esophagus, hypothyroidism, CKD stage II, came with worsening of breathing symptoms including productive cough and SOB.  His symptoms started about 3 wks ago, initially with a dry cough, stuffy and runny nose. On September 15th, he did a home COVID test which turned positive. He informed his PCP who ordered Albuterol pump PRN for breathing symptoms. Gradually he developed SOB after about 2 week ago and went to see PCP again who started him on Paxlovid BID x5 days (completed earlier this week) and a 7 days course of prednisone which he just completed yesterday. But family did not see his breathing status getting better and his cough became productive with yellowish thick sputums since Monday, no fever or chills. He does have some incontinence every time he have severe cough. No abd pain or diarrhea, no chest pain. Yesterday, home pulsOx dropped to mid 80s and PCP recommend patient coming in to ED. wife also reported patient has mild dementia with memory problems but never had aspiration before and no choking or cough after eating regular food.  ED Course: Hypoxic with O2 saturation 87% on room air, stabilized on 2 L, tachycardia, blood pressure stable.  Chest x-ray showed bilateral lower field infiltrates compatible with bilateral pneumonia.  WBC 16.7, creatinine 1.5 bicarb 22.  COVID positive.  Patient received remdesivir and ceftriaxone plus azithromycin in the ED.  Review of Systems: As per HPI  otherwise 14 point review of systems negative.    Past Medical History:  Diagnosis Date   Allergy    Anxiety    Asthma    since 2013   Barrett's esophagus 03/09/2014   Bilateral lumbar radiculopathy    since 2013   CAD S/P percutaneous coronary angioplasty 1987   a. 1987 angioplasty and BMS- Cx; b. 1997 CABG x 4;  c. MYOVIEW 1/'05: ? Ischemia vs. artifact --> sent for cath - patent grafts.   Chronic atrial fibrillation ([place])    a. Prev on amiodarone-->d/c 08/2016 in setting of recurrent AF, LFT abnormalities, and pulmonary symptoms;  b. CHA2DS2VASc = 4-->eliquis.   Chronic bronchitis ([place])    since 2007   COPD (chronic obstructive pulmonary disease) (HCC)    DDD (degenerative disc disease)    Diverticulosis    Dyslipidemia, goal LDL below 70    Doing better off of Zocor, currently on Lescol    ED (erectile dysfunction)    Elevated LFTs    Esophageal reflux    GERD (gastroesophageal reflux disease)    Gout, unspecified    Hearing loss    Helicobacter pylori gastritis 07/2009   Herpes zoster 2009   Hypothyroidism    since 2010   Iron deficiency anemia, unspecified    Leg weakness    a. 09/2016 ABI's: R 1.05, L 0.98.   Other and unspecified hyperlipidemia    Other B-complex deficiencies    Personal history of colonic polyps    Prostatitis    chronic ulcerative   Pulmonary fibrosis ([place])    and bronchiectasis, since 2015  Renal stone    Right   S/P CABG x 4 1997   a) LIMA-LAD, SVG to OM, SVG-dRCA-RPL; b) FALSE + MYOVIEW --> CATH 1/'05: 100% LAD after widelly patent D1, all Cx OM branches 100%, pRCA 100%, SVG-PDA patent w/ retrograde filling of RPL, SVG-OM widely patent ~ normal OM, LIMA-LAD patent.   Spinal stenosis    ST elevation myocardial infarction (STEMI) of inferior wall, subsequent episode of care ([place]) 1987, 1997   a) PTCA of Cx; b) CABG    Type II or unspecified type diabetes mellitus without mention of complication, not stated as uncontrolled    no meds    Ulcerative colitis, unspecified    Unspecified essential hypertension    Vitamin D deficiency     Past Surgical History:  Procedure Laterality Date   BLEPHAROPLASTY Bilateral    for ptosis   CARDIAC CATHETERIZATION  08/17/2003   "False positive stress test " grafts patent; RCA proximal 100% LAD 100% occlusion after normal D1 with 80% ostial as SP1; circumflex patent but OM1 on to all occluded; EF 50-55%   CARDIAC CATHETERIZATION  11/1995   Preop CABG: LAD 90% at D1, circumflex 100 and OM a 90% proximal, 80% distal   Carotid Duplex Doppler  12/23/2009   Right&Left ICAs 0-49%, mildly abnormal study,    CHOLECYSTECTOMY     COLONOSCOPY     CORONARY ARTERY BYPASS GRAFT  11/1995   LIMA-LAD, SVG to OM, SVG-dRCA-RPL   ESOPHAGOGASTRODUODENOSCOPY     FLEXIBLE SIGMOIDOSCOPY     NM MYOCAR PERF EJECTION FRACTION  07/03/2003   FALSE POSITIVE TEST; Bruce protocol, negative test with scintigraphic evidence of inferoapical scar, diaphragmatic attenuation, moderate ischemia.   NM MYOVIEW LTD  2005   Questionable ischemia that is likely either infarct versus artifact; normal   TRANSTHORACIC ECHOCARDIOGRAM  12/2017   EF 55-60%.  Unable to assess diastolic function due to A. fib.  Moderate LA/RA dilation.  Mild MR.     reports that he quit smoking about 36 years ago. His smoking use included cigarettes. He has a 32.00 pack-year smoking history. He has never used smokeless tobacco. He reports that he does not drink alcohol and does not use drugs.  Allergies  Allergen Reactions   Atorvastatin Other (See Comments)    Arthralgia, myalgia   Fluvastatin Other (See Comments)    Arthralgia, myalgias   Lisinopril Cough   Metformin And Related Other (See Comments)    Anorexia, diarrhea, nausea, weight loss   Pravastatin Other (See Comments)    Arthralgia, myalgias   Simvastatin Other (See Comments)    myalgias   Spiriva Handihaler [Tiotropium Bromide Monohydrate] Nausea Only    Family History  Problem  Relation Age of Onset   Diabetes Mother    Congestive Heart Failure Mother    Dementia Father    Pneumonia Father        died of this at age 67, hx of bronchiectasis   Heart disease Sister    Diabetes Sister    Lung cancer Brother        died at age 90   Hypertension Sister    CVA Sister    Colon cancer Neg Hx    Esophageal cancer Neg Hx    Rectal cancer Neg Hx    Stomach cancer Neg Hx      Prior to Admission medications   Medication Sig Start Date End Date Taking? Authorizing Provider  ADVAIR DISKUS 250-50 MCG/DOSE AEPB Inhale 1 puff into the  lungs 2 (two) times daily.  08/16/16   [provider]  albuterol (PROVENTIL HFA;VENTOLIN HFA) 108 (90 Base) MCG/ACT inhaler Inhale into the lungs every 6 (six) hours as needed for wheezing or shortness of breath.    [provider]  apixaban (ELIQUIS) 2.5 MG TABS tablet Take 1 tablet (2.5 mg total) by mouth 2 (two) times daily. 11/21/20   Leonie Man, MD  balsalazide (COLAZAL) 750 MG capsule TAKE 2 CAPSULES BY MOUTH TWICE DAILY 12/05/19   Gatha Mayer, MD  BAYER CONTOUR TEST test strip  05/07/14   [provider]  CRESTOR 20 MG tablet Take 1 tablet by mouth daily. 07/23/14   [provider]  cyanocobalamin 2000 MCG tablet Take 2,000 mcg by mouth daily.    [provider]  diltiazem (CARDIZEM CD) 180 MG 24 hr capsule Take 1 capsule (180 mg total) by mouth daily. 01/28/21 01/23/22  Leonie Man, MD  finasteride (PROSCAR) 5 MG tablet Take 1 tablet by mouth daily. 04/14/17   [provider]  furosemide (LASIX) 20 MG tablet Take 20 mg by mouth daily. 11/07/18   [provider]  glipiZIDE (GLUCOTROL XL) 5 MG 24 hr tablet Take 5 mg by mouth daily. 10/06/18   [provider]  JANUVIA 50 MG tablet Take 50 mg by mouth daily. 10/05/16   [provider]  levothyroxine (SYNTHROID, LEVOTHROID) 75 MCG tablet Take 1 tablet by mouth daily. Take 1 tab daily 01/08/15   [provider]  metFORMIN (GLUCOPHAGE) 500 MG tablet Take 500 mg by mouth 2 (two) times a day.  02/06/16   [provider]  oxybutynin (DITROPAN) 5 MG tablet Take 5 mg by mouth 1 day or 1 dose.  10/02/11   [provider]  pantoprazole (PROTONIX) 40 MG tablet Take 1 tablet by mouth 2 (two) times daily.  09/07/14   [provider]  vitamin D, CHOLECALCIFEROL, 400 UNITS tablet Take 400 Units by mouth daily.    [provider]    Physical Exam: Vitals:   04/26/21 1348 04/26/21 1349 04/26/21 1400 04/26/21 1415  BP:   131/61 135/69  Pulse: (!) 103  (!) 110 (!) 110  Resp: 18  18 (!) 24  Temp:      TempSrc:      SpO2: (!) 87% 92% 93% 95%    Constitutional: NAD, calm, comfortable Vitals:   04/26/21 1348 04/26/21 1349 04/26/21 1400 04/26/21 1415  BP:   131/61 135/69  Pulse: (!) 103  (!) 110 (!) 110  Resp: 18  18 (!) 24  Temp:      TempSrc:      SpO2: (!) 87% 92% 93% 95%   Eyes: PERRL, lids and conjunctivae normal ENMT: Mucous membranes are moist. Posterior pharynx clear of any exudate or lesions.Normal dentition.  Neck: normal, supple, no masses, no thyromegaly Respiratory: clear to auscultation bilaterally, no wheezing, coarse crackles on B/L mid-lower fields. Increasing respiratory effort. No accessory muscle use.  Cardiovascular: Regular rate and rhythm, no murmurs / rubs / gallops. No extremity edema. 2+ pedal pulses. No carotid bruits.  Abdomen: no tenderness, no masses palpated. No hepatosplenomegaly. Bowel sounds positive.  Musculoskeletal: no clubbing / cyanosis. No joint deformity upper and lower extremities. Good ROM, no contractures. Normal muscle tone.  Skin: no rashes, lesions, ulcers. No induration Neurologic: CN 2-12 grossly intact. Sensation intact, DTR normal. Strength 5/5 in all 4.  Psychiatric: Normal judgment and insight. Alert and oriented x 3. Normal  mood.     Labs on Admission: I have personally reviewed following labs and  imaging studies  CBC: Recent Labs  Lab 04/26/21 0351  WBC 16.4*  NEUTROABS 14.4*  HGB 13.9  HCT 42.1  MCV 80.3  PLT 790   Basic Metabolic Panel: Recent Labs  Lab 04/26/21 0351  NA 130*  K 4.4  CL 96*  CO2 22  GLUCOSE 193*  BUN 28*  CREATININE 1.56*  CALCIUM 9.4   GFR: CrCl cannot be calculated (Unknown ideal weight.). Liver Function Tests: Recent Labs  Lab 04/26/21 0351  AST 26  ALT 26  ALKPHOS 59  BILITOT 1.3*  PROT 6.5  ALBUMIN 3.6   No results for input(s): LIPASE, AMYLASE in the last 168 hours. No results for input(s): AMMONIA in the last 168 hours. Coagulation Profile: No results for input(s): INR, PROTIME in the last 168 hours. Cardiac Enzymes: No results for input(s): CKTOTAL, CKMB, CKMBINDEX, TROPONINI in the last 168 hours. BNP (last 3 results) No results for input(s): PROBNP in the last 8760 hours. HbA1C: No results for input(s): HGBA1C in the last 72 hours. CBG: No results for input(s): GLUCAP in the last 168 hours. Lipid Profile: No results for input(s): CHOL, HDL, LDLCALC, TRIG, CHOLHDL, LDLDIRECT in the last 72 hours. Thyroid Function Tests: No results for input(s): TSH, T4TOTAL, FREET4, T3FREE, THYROIDAB in the last 72 hours. Anemia Panel: No results for input(s): VITAMINB12, FOLATE, FERRITIN, TIBC, IRON, RETICCTPCT in the last 72 hours. Urine analysis: No results found for: COLORURINE, APPEARANCEUR, LABSPEC, PHURINE, GLUCOSEU, HGBUR, BILIRUBINUR, KETONESUR, PROTEINUR, UROBILINOGEN, NITRITE, LEUKOCYTESUR  Radiological Exams on Admission: DG Chest 2 View  Result Date: 04/26/2021 CLINICAL DATA:  80 year old male with cough for 3 weeks and congestion. Positive for COVID-19 last month. EXAM: CHEST - 2 VIEW COMPARISON:  03/10/2021 and earlier. FINDINGS: AP and lateral views. Stable chronically large lung volumes. Stable cardiac size and mediastinal contours. Prior CABG. Visualized tracheal air column is within normal limits. No pneumothorax. No  pulmonary edema or pleural effusion. Patchy and nodular bibasilar pulmonary opacity is new since August. No consolidation. Stable visualized osseous structures. Negative visible bowel gas. IMPRESSION: New patchy and nodular bibasilar pulmonary opacity since August, compatible with bilateral pneumonia. Aspiration also a consideration. No pleural effusion. Underlying chronic pulmonary hyperinflation. Electronically Signed   By: Genevie Ann M.D.   On: 04/26/2021 04:46    EKG: Independently reviewed.  Chronic A. fib, chronic ST changes III aVF.  Assessment/Plan Active Problems:   Community acquired pneumonia   PNA (pneumonia)  (please populate well all problems here in Problem List. (For example, if patient is on BP meds at home and you resume or decide to hold them, it is a problem that needs to be her. Same for CAD, COPD, HLD and so on)   Acute hypoxic respite failure -Secondary to bilateral pneumonia, postviral, bacterial, likely from increased postnasal drips during COVID infection, aspiration less likely. -Continue ceftriaxone and azithromycin. -Breathing treatment. -Flutter valve -BNP, CHF considered to be less likely given history and physical. -Speech evaluation, but low suspicion on aspiration.  COVID infection -D/C remdesivir, patient already completed 5 days course of Paxlovid and prednisone.  Chronic A. Fib -Change Cardizem to short-acting -As needed metoprolol -Continue Eliquis.  Leukocytosis -Secondary to pneumonia and recent steroid use. -Antibiotics as above.  BPH -UA pending. -Continue Proscar and Flomax  HTN -Euvolemic, continue daily Lasix.  Hypothyroidism -Continue Synthroid    DVT prophylaxis: Eliquis Code Status: Full code,  Family Communication: Wife  at bedside Disposition Plan: Expect more than 2 midnight hospital stay Consults called: None Admission status: Tele admit   Lequita Halt MD [facility] Pager [phone]  04/26/2021, 3:01 PM

## 2021-04-27 ENCOUNTER — Encounter (HOSPITAL_COMMUNITY): Payer: Self-pay | Admitting: Internal Medicine

## 2021-04-27 DIAGNOSIS — J9601 Acute respiratory failure with hypoxia: Secondary | ICD-10-CM

## 2021-04-27 DIAGNOSIS — E1165 Type 2 diabetes mellitus with hyperglycemia: Secondary | ICD-10-CM

## 2021-04-27 DIAGNOSIS — I4811 Longstanding persistent atrial fibrillation: Secondary | ICD-10-CM

## 2021-04-27 LAB — GLUCOSE, CAPILLARY
Glucose-Capillary: 213 mg/dL — ABNORMAL HIGH (ref 70–99)
Glucose-Capillary: 282 mg/dL — ABNORMAL HIGH (ref 70–99)

## 2021-04-27 LAB — CBG MONITORING, ED
Glucose-Capillary: 225 mg/dL — ABNORMAL HIGH (ref 70–99)
Glucose-Capillary: 209 mg/dL — ABNORMAL HIGH (ref 70–99)

## 2021-04-27 LAB — CBC
HCT: 40.6 % (ref 39.0–52.0)
Hemoglobin: 13.5 g/dL (ref 13.0–17.0)
MCH: 26.8 pg (ref 26.0–34.0)
MCHC: 33.3 g/dL (ref 30.0–36.0)
MCV: 80.7 fL (ref 80.0–100.0)
Platelets: 157 10*3/uL (ref 150–400)
RBC: 5.03 MIL/uL (ref 4.22–5.81)
RDW: 16.7 % — ABNORMAL HIGH (ref 11.5–15.5)
WBC: 17.2 10*3/uL — ABNORMAL HIGH (ref 4.0–10.5)
nRBC: 0 % (ref 0.0–0.2)

## 2021-04-27 LAB — BASIC METABOLIC PANEL
Anion gap: 14 (ref 5–15)
BUN: 30 mg/dL — ABNORMAL HIGH (ref 8–23)
CO2: 18 mmol/L — ABNORMAL LOW (ref 22–32)
Calcium: 8.6 mg/dL — ABNORMAL LOW (ref 8.9–10.3)
Chloride: 98 mmol/L (ref 98–111)
Creatinine, Ser: 1.61 mg/dL — ABNORMAL HIGH (ref 0.61–1.24)
GFR, Estimated: 42 mL/min — ABNORMAL LOW (ref >60)
Glucose, Bld: 197 mg/dL — ABNORMAL HIGH (ref 70–99)
Potassium: 4.1 mmol/L (ref 3.5–5.1)
Sodium: 130 mmol/L — ABNORMAL LOW (ref 135–145)

## 2021-04-27 LAB — I-STAT ARTERIAL BLOOD GAS, ED
Acid-base deficit: 5 mmol/L — ABNORMAL HIGH (ref 0.0–2.0)
Bicarbonate: 19.2 mmol/L — ABNORMAL LOW (ref 20.0–28.0)
Calcium, Ion: 1.22 mmol/L (ref 1.15–1.40)
HCT: 40 % (ref 39.0–52.0)
Hemoglobin: 13.6 g/dL (ref 13.0–17.0)
O2 Saturation: 98 %
Patient temperature: 98.6
Potassium: 4 mmol/L (ref 3.5–5.1)
Sodium: 132 mmol/L — ABNORMAL LOW (ref 135–145)
TCO2: 20 mmol/L — ABNORMAL LOW (ref 22–32)
pCO2 arterial: 32.6 mmHg (ref 32.0–48.0)
pH, Arterial: 7.378 (ref 7.350–7.450)
pO2, Arterial: 110 mmHg — ABNORMAL HIGH (ref 83.0–108.0)

## 2021-04-27 LAB — STREP PNEUMONIAE URINARY ANTIGEN: Strep Pneumo Urinary Antigen: NEGATIVE

## 2021-04-27 MED ORDER — INSULIN DETEMIR 100 UNIT/ML ~~LOC~~ SOLN
10.0000 [IU] | Freq: Two times a day (BID) | SUBCUTANEOUS | Status: DC
  Administered 2021-04-27 – 2021-04-28 (×3): 10 [IU] via SUBCUTANEOUS
  Filled 2021-04-27 (×5): qty 0.1

## 2021-04-27 MED ORDER — INSULIN ASPART 100 UNIT/ML IJ SOLN
3.0000 [IU] | Freq: Three times a day (TID) | INTRAMUSCULAR | Status: DC
Start: 2021-04-27 — End: 2021-05-01
  Administered 2021-04-27 – 2021-04-30 (×12): 3 [IU] via SUBCUTANEOUS

## 2021-04-27 MED ORDER — ALBUTEROL SULFATE HFA 108 (90 BASE) MCG/ACT IN AERS
2.0000 | INHALATION_SPRAY | RESPIRATORY_TRACT | Status: DC | PRN
  Filled 2021-04-27: qty 6.7

## 2021-04-27 MED ORDER — ALBUTEROL SULFATE HFA 108 (90 BASE) MCG/ACT IN AERS: 2.0000 | INHALATION_SPRAY | Freq: Four times a day (QID) | RESPIRATORY_TRACT | Status: DC

## 2021-04-27 MED ORDER — INSULIN ASPART 100 UNIT/ML IJ SOLN
0.0000 [IU] | Freq: Every day | INTRAMUSCULAR | Status: DC
  Administered 2021-04-27 – 2021-04-28 (×2): 3 [IU] via SUBCUTANEOUS
  Administered 2021-04-30: 5 [IU] via SUBCUTANEOUS
  Administered 2021-05-01: 3 [IU] via SUBCUTANEOUS
  Administered 2021-05-03: 4 [IU] via SUBCUTANEOUS

## 2021-04-27 MED ORDER — SODIUM CHLORIDE 0.9 % IV SOLN: INTRAVENOUS | Status: AC

## 2021-04-27 MED ORDER — INSULIN ASPART 100 UNIT/ML IJ SOLN
0.0000 [IU] | Freq: Three times a day (TID) | INTRAMUSCULAR | Status: DC
  Administered 2021-04-27 – 2021-04-28 (×4): 3 [IU] via SUBCUTANEOUS
  Administered 2021-04-28: 2 [IU] via SUBCUTANEOUS
  Administered 2021-04-28: 3 [IU] via SUBCUTANEOUS
  Administered 2021-04-29: 9 [IU] via SUBCUTANEOUS
  Administered 2021-04-29: 2 [IU] via SUBCUTANEOUS
  Administered 2021-04-29: 9 [IU] via SUBCUTANEOUS
  Administered 2021-04-30: 1 [IU] via SUBCUTANEOUS
  Administered 2021-04-30: 3 [IU] via SUBCUTANEOUS
  Administered 2021-04-30: 7 [IU] via SUBCUTANEOUS
  Administered 2021-05-01: 3 [IU] via SUBCUTANEOUS
  Administered 2021-05-01: 2 [IU] via SUBCUTANEOUS
  Administered 2021-05-02: 1 [IU] via SUBCUTANEOUS
  Administered 2021-05-02: 3 [IU] via SUBCUTANEOUS
  Administered 2021-05-02: 2 [IU] via SUBCUTANEOUS
  Administered 2021-05-03: 5 [IU] via SUBCUTANEOUS
  Administered 2021-05-03: 3 [IU] via SUBCUTANEOUS
  Administered 2021-05-04: 5 [IU] via SUBCUTANEOUS
  Administered 2021-05-04: 3 [IU] via SUBCUTANEOUS
  Administered 2021-05-05 (×2): 1 [IU] via SUBCUTANEOUS

## 2021-04-27 MED ORDER — METHYLPREDNISOLONE SODIUM SUCC 40 MG IJ SOLR
40.0000 mg | Freq: Two times a day (BID) | INTRAMUSCULAR | Status: DC
  Administered 2021-04-27 – 2021-04-28 (×3): 40 mg via INTRAVENOUS
  Filled 2021-04-27 (×3): qty 1

## 2021-04-27 NOTE — ED Notes (Signed)
Lunch ordered 

## 2021-04-27 NOTE — Evaluation (Signed)
Clinical/Bedside Swallow Evaluation Patient Details  Name: Timothy Mclean MRN: [id number] Date of Birth: Oct 21, 1940  Today's Date: 04/27/2021 Time: SLP Start Time (ACUTE ONLY): 5027 SLP Stop Time (ACUTE ONLY): 1206 SLP Time Calculation (min) (ACUTE ONLY): 21 min  Past Medical History:  Past Medical History:  Diagnosis Date   Allergy    Anxiety    Asthma    since 2013   Barrett's esophagus 03/09/2014   Bilateral lumbar radiculopathy    since 2013   CAD S/P percutaneous coronary angioplasty 1987   a. 1987 angioplasty and BMS- Cx; b. 1997 CABG x 4;  c. MYOVIEW 1/'05: ? Ischemia vs. artifact --> sent for cath - patent grafts.   Chronic atrial fibrillation ([place])    a. Prev on amiodarone-->d/c 08/2016 in setting of recurrent AF, LFT abnormalities, and pulmonary symptoms;  b. CHA2DS2VASc = 4-->eliquis.   Chronic bronchitis ([place])    since 2007   COPD (chronic obstructive pulmonary disease) (HCC)    DDD (degenerative disc disease)    Diverticulosis    Dyslipidemia, goal LDL below 70    Doing better off of Zocor, currently on Lescol    ED (erectile dysfunction)    Elevated LFTs    Esophageal reflux    GERD (gastroesophageal reflux disease)    Gout, unspecified    Hearing loss    Helicobacter pylori gastritis 07/2009   Herpes zoster 2009   Hypothyroidism    since 2010   Iron deficiency anemia, unspecified    Leg weakness    a. 09/2016 ABI's: R 1.05, L 0.98.   Other and unspecified hyperlipidemia    Other B-complex deficiencies    Personal history of colonic polyps    Prostatitis    chronic ulcerative   Pulmonary fibrosis (HCC)    and bronchiectasis, since 2015   Renal stone    Right   S/P CABG x 4 1997   a) LIMA-LAD, SVG to OM, SVG-dRCA-RPL; b) FALSE + MYOVIEW --> CATH 1/'05: 100% LAD after widelly patent D1, all Cx OM branches 100%, pRCA 100%, SVG-PDA patent w/ retrograde filling of RPL, SVG-OM widely patent ~ normal OM, LIMA-LAD patent.   Spinal stenosis    ST elevation  myocardial infarction (STEMI) of inferior wall, subsequent episode of care ([place]) 1987, 1997   a) PTCA of Cx; b) CABG    Type II or unspecified type diabetes mellitus without mention of complication, not stated as uncontrolled    no meds   Ulcerative colitis, unspecified    Unspecified essential hypertension    Vitamin D deficiency    Past Surgical History:  Past Surgical History:  Procedure Laterality Date   BLEPHAROPLASTY Bilateral    for ptosis   CARDIAC CATHETERIZATION  08/17/2003   "False positive stress test " grafts patent; RCA proximal 100% LAD 100% occlusion after normal D1 with 80% ostial as SP1; circumflex patent but OM1 on to all occluded; EF 50-55%   CARDIAC CATHETERIZATION  11/1995   Preop CABG: LAD 90% at D1, circumflex 100 and OM a 90% proximal, 80% distal   Carotid Duplex Doppler  12/23/2009   Right&Left ICAs 0-49%, mildly abnormal study,    CHOLECYSTECTOMY     COLONOSCOPY     CORONARY ARTERY BYPASS GRAFT  11/1995   LIMA-LAD, SVG to OM, SVG-dRCA-RPL   ESOPHAGOGASTRODUODENOSCOPY     FLEXIBLE SIGMOIDOSCOPY     NM MYOCAR PERF EJECTION FRACTION  07/03/2003   FALSE POSITIVE TEST; Bruce protocol, negative test with scintigraphic evidence of inferoapical  scar, diaphragmatic attenuation, moderate ischemia.   NM MYOVIEW LTD  2005   Questionable ischemia that is likely either infarct versus artifact; normal   TRANSTHORACIC ECHOCARDIOGRAM  12/2017   EF 55-60%.  Unable to assess diastolic function due to A. fib.  Moderate LA/RA dilation.  Mild MR.   HPI:  Pt is an 80 y.o. male who presented with productive cough and SOB. Pt's wife reported on admission that pt has mild dementia with memory problems but never had aspiration before and no choking or cough after eating regular food. CXR 10/8: New patchy and nodular bibasilar pulmonary opacity since August,  compatible with bilateral pneumonia. Aspiration also a  consideration.  PMH: HTN, chronic A. fib, CAD s/p CABG 1997, IIDM,  asthma/COPD, GERD/Barrett esophagus, hypothyroidism, CKD stage II, positive COVID-19 test on 04/03/21.    Assessment / Plan / Recommendation  Clinical Impression  Pt was seen in ED for bedside swallow evaluation with his wife present. Both parties denied the pt having any symptoms of oropharyngeal dysphagia at baseline. Oral mechanism exam was limited due to pt's difficulty following commands; however, oral motor strength and ROM appeared grossly WFL, and he presented with full dentures. Pt demonstrated coughing with thin liquids via cup and straw, with nectar thick liquids via straw, and inconsistently with regular texture solids. Mastication and oral clearance were adequate. Aspiration is questioned, but pt does present with frequent coughing at baseline so the possibility of at least some coughing being unrelated to swallowing is also considered. A dysphagia 3 diet with nectar thick liquids is recommended at this time.  Instrumental assessment may be clinically indicated if pt's symptoms persist, and SLP will follow pt.  SLP Visit Diagnosis: Dysphagia, unspecified (R13.10)    Aspiration Risk  Mild aspiration risk    Diet Recommendation Dysphagia 3 (Mech soft);Nectar-thick liquid   Liquid Administration via: Cup;No straw Medication Administration: Whole meds with liquid Supervision: Patient able to self feed Compensations: Slow rate;Small sips/bites Postural Changes: Seated upright at 90 degrees    Other  Recommendations Oral Care Recommendations: Oral care BID    Recommendations for follow up therapy are one component of a multi-disciplinary discharge planning process, led by the attending physician.  Recommendations may be updated based on patient status, additional functional criteria and insurance authorization.  Follow up Recommendations  (TBD)      Frequency and Duration min 2x/week  2 weeks       Prognosis Prognosis for Safe Diet Advancement: Good Barriers to Reach Goals:  Cognitive deficits;Severity of deficits      Swallow Study   General Date of Onset: 04/26/21 HPI: Pt is an 80 y.o. male who presented with productive cough and SOB. Pt's wife reported on admission that pt has mild dementia with memory problems but never had aspiration before and no choking or cough after eating regular food. CXR 10/8: New patchy and nodular bibasilar pulmonary opacity since August,  compatible with bilateral pneumonia. Aspiration also a  consideration.  PMH: HTN, chronic A. fib, CAD s/p CABG 1997, IIDM, asthma/COPD, GERD/Barrett esophagus, hypothyroidism, CKD stage II, positive COVID-19 test on 04/03/21. Type of Study: Bedside Swallow Evaluation Previous Swallow Assessment: none Diet Prior to this Study: Regular;Thin liquids Temperature Spikes Noted: No Respiratory Status: Nasal cannula History of Recent Intubation: No Behavior/Cognition: Alert;Cooperative;Pleasant mood;Requires cueing Oral Cavity Assessment: Within Functional Limits Oral Care Completed by SLP: No Oral Cavity - Dentition: Dentures, top;Dentures, bottom Vision: Functional for self-feeding Self-Feeding Abilities: Needs assist Patient Positioning: Upright in bed;Postural control  adequate for testing Baseline Vocal Quality: Normal Volitional Swallow: Able to elicit    Oral/Motor/Sensory Function Overall Oral Motor/Sensory Function: Within functional limits   Ice Chips Ice chips: Within functional limits Presentation: Spoon   Thin Liquid Thin Liquid: Impaired Presentation: Cup;Straw Pharyngeal  Phase Impairments: Cough - Immediate;Cough - Delayed    Nectar Thick Nectar Thick Liquid: Impaired Presentation: Straw;Cup Pharyngeal Phase Impairments: Cough - Immediate;Cough - Delayed   Honey Thick Honey Thick Liquid: Not tested   Puree Puree: Within functional limits Presentation: Spoon   Solid     Solid: Impaired Presentation: Self Fed Pharyngeal Phase Impairments: Cough - Delayed     Iktan Aikman I.  Hardin Negus, [place], [place] Office number [phone] Pager [place] 04/27/2021,12:40 PM

## 2021-04-27 NOTE — Progress Notes (Signed)
Pt arrived to 5W23 from ED. Pt currently requiring 15L hi flo [place]. VS stable. No c/o pain. Pt oriented to room. Wife at bedside. Suction cannister set up. Bed locked and low. Call bell within reach.

## 2021-04-27 NOTE — Progress Notes (Signed)
[facility] PROGRESS NOTE    Progress Note  Timothy Mclean  DSK:[id number] DOB: 04/04/1941 DOA: 04/26/2021 PCP: Derinda Late, MD     Brief Narrative:   Timothy Mclean is an 80 y.o. male past medical history significant for essential hypertension, chronic atrial fibrillation on Eliquis, CAD with CABG in 97, diabetes mellitus type 2 Barrett esophagus, chronic kidney disease stage II came in with difficulty breathing and productive cough that started about 3 weeks prior to admission, as per patient had a home test of COVID on September 15 that was positive, went to PCP who provided him Paxlovid for 5 days and a 7-day course of prednisone which she completed yesterday.  In the ED was found to be satting 87% placed on 2 L chest x-ray showed bilateral infiltrates White count of 16 bicarb of 1.5 SARS-CoV-2 PCR was positive was started on remdesivir Rocephin and azithromycin   Assessment/Plan:   Acute respiratory failure with hypoxia probably secondary to community-acquired pneumonia: COVID-19 infection less likely. Was started on Rocephin and azithromycin and inhalers. He has a 20-year pack per day history of smoking, he is moving poor air, but no wheezing physical exam agree with IV steroids. This morning he is gone about 10 L high flow nasal cannula to keep saturations greater 92%.  Blood pressure and heart rate have been relatively stable. Culture data has been sent and continues to be negative till date. Continue flutter valve and incentive spirometry we will asked respiratory to do pulmonary toileting  COVID-19 infection: Agree has completed 5-day course of treatment and its 10-day course of steroids is unlikely to be COVID-19 infection.  Chronic atrial fibrillation: Mildly tachycardic around 10 5-97, currently on diltiazem short acting plus metoprolol as needed continue Eliquis.  BPH: Continue Proscar and Flomax.  Essential hypertension: Blood pressure seems to be relatively  well controlled, his heart rate is improving with treatment of his community-acquired pneumonia. Continue diltiazem and Lasix.  Hypovolemic hyponatremia: In the setting of decreased oral intake and Lasix use.  Diabetes mellitus type 2, uncontrolled: With a last A1c of 7.7 he is now on steroids which will make his blood glucose erratic. Hold oral hypoglycemic agent start him on long-acting insulin plus sliding scale.  DVT prophylaxis: Lovenox Family Communication:none Status is: Inpatient  Remains inpatient appropriate because:Hemodynamically unstable  Dispo: The patient is from: Home              Anticipated d/c is to: Home              Patient currently is not medically stable to d/c.   Difficult to place patient No   Code Status:     Code Status Orders  (From admission, onward)           Start     Ordered   04/26/21 1459  Full code  Continuous        04/26/21 1500           Code Status History     This patient has a current code status but no historical code status.         IV Access:   Peripheral IV   Procedures and diagnostic studies:   DG Chest 2 View  Result Date: 04/26/2021 CLINICAL DATA:  80 year old male with cough for 3 weeks and congestion. Positive for COVID-19 last month. EXAM: CHEST - 2 VIEW COMPARISON:  03/10/2021 and earlier. FINDINGS: AP and lateral views. Stable chronically large lung volumes. Stable cardiac size and  mediastinal contours. Prior CABG. Visualized tracheal air column is within normal limits. No pneumothorax. No pulmonary edema or pleural effusion. Patchy and nodular bibasilar pulmonary opacity is new since August. No consolidation. Stable visualized osseous structures. Negative visible bowel gas. IMPRESSION: New patchy and nodular bibasilar pulmonary opacity since August, compatible with bilateral pneumonia. Aspiration also a consideration. No pleural effusion. Underlying chronic pulmonary hyperinflation. Electronically Signed    By: Genevie Ann M.D.   On: 04/26/2021 04:46     Medical Consultants:   None.   Subjective:    Timothy Mclean he relates his breathing is about the same when he came in still with a ongoing productive cough.  Objective:    Vitals:   04/27/21 0615 04/27/21 0630 04/27/21 0645 04/27/21 0700  BP: 126/70 136/70 129/78 131/68  Pulse: 89 85 91 (!) 102  Resp: 18 (!) 21 (!) 25 (!) 21  Temp:      TempSrc:      SpO2: 99% 100% 97% 96%   SpO2: 96 % O2 Flow Rate (L/min): 10 L/min   Intake/Output Summary (Last 24 hours) at 04/27/2021 0725 Last data filed at 04/27/2021 0033 Gross per 24 hour  Intake --  Output 300 ml  Net -300 ml   There were no vitals filed for this visit.  Exam: General exam: In no acute distress. Respiratory system: Moderate air movement with rhonchi bilaterally.  He also has diffuse crackles throughout both lungs Cardiovascular system: S1 & S2 heard, RRR. No JVD. Gastrointestinal system: Abdomen is nondistended, soft and nontender.  Extremities: No pedal edema. Skin: No rashes, lesions or ulcers Psychiatry: Judgement and insight appear normal. Mood & affect appropriate.    Data Reviewed:    Labs: Basic Metabolic Panel: Recent Labs  Lab 04/26/21 0351 04/27/21 0239 04/27/21 0330  NA 130* 132* 130*  K 4.4 4.0 4.1  CL 96*  --  98  CO2 22  --  18*  GLUCOSE 193*  --  197*  BUN 28*  --  30*  CREATININE 1.56*  --  1.61*  CALCIUM 9.4  --  8.6*   GFR CrCl cannot be calculated (Unknown ideal weight.). Liver Function Tests: Recent Labs  Lab 04/26/21 0351  AST 26  ALT 26  ALKPHOS 59  BILITOT 1.3*  PROT 6.5  ALBUMIN 3.6   No results for input(s): LIPASE, AMYLASE in the last 168 hours. No results for input(s): AMMONIA in the last 168 hours. Coagulation profile No results for input(s): INR, PROTIME in the last 168 hours. COVID-19 Labs  No results for input(s): DDIMER, FERRITIN, LDH, CRP in the last 72 hours.  Lab Results  Component Value Date    SARSCOV2NAA POSITIVE (A) 04/26/2021    CBC: Recent Labs  Lab 04/26/21 0351 04/27/21 0239 04/27/21 0330  WBC 16.4*  --  17.2*  NEUTROABS 14.4*  --   --   HGB 13.9 13.6 13.5  HCT 42.1 40.0 40.6  MCV 80.3  --  80.7  PLT 200  --  157   Cardiac Enzymes: No results for input(s): CKTOTAL, CKMB, CKMBINDEX, TROPONINI in the last 168 hours. BNP (last 3 results) No results for input(s): PROBNP in the last 8760 hours. CBG: Recent Labs  Lab 04/26/21 1719 04/26/21 2216  GLUCAP 152* 150*   D-Dimer: No results for input(s): DDIMER in the last 72 hours. Hgb A1c: Recent Labs    04/26/21 1501  HGBA1C 7.7*   Lipid Profile: No results for input(s): CHOL, HDL, LDLCALC, TRIG, CHOLHDL,  LDLDIRECT in the last 72 hours. Thyroid function studies: No results for input(s): TSH, T4TOTAL, T3FREE, THYROIDAB in the last 72 hours.  Invalid input(s): FREET3 Anemia work up: No results for input(s): VITAMINB12, FOLATE, FERRITIN, TIBC, IRON, RETICCTPCT in the last 72 hours. Sepsis Labs: Recent Labs  Lab 04/26/21 0351 04/27/21 0330  WBC 16.4* 17.2*   Microbiology Recent Results (from the past 240 hour(s))  Resp Panel by RT-PCR (Flu A&B, Covid) Nasopharyngeal Swab     Status: Abnormal   Collection Time: 04/26/21  3:43 AM   Specimen: Nasopharyngeal Swab; Nasopharyngeal(NP) swabs in vial transport medium  Result Value Ref Range Status   SARS Coronavirus 2 by RT PCR POSITIVE (A) NEGATIVE Final    Comment: RESULT CALLED TO, READ BACK BY AND VERIFIED WITH: RN BRITTANY O. 04/26/21@4 :50 BY TW (NOTE) SARS-CoV-2 target nucleic acids are DETECTED.  The SARS-CoV-2 RNA is generally detectable in upper respiratory specimens during the acute phase of infection. Positive results are indicative of the presence of the identified virus, but do not rule out bacterial infection or co-infection with other pathogens not detected by the test. Clinical correlation with patient history and other diagnostic  information is necessary to determine patient infection status. The expected result is Negative.  Fact Sheet for Patients: EntrepreneurPulse.com.au  Fact Sheet for Healthcare Providers: IncredibleEmployment.be  This test is not yet approved or cleared by the [place] FDA and  has been authorized for detection and/or diagnosis of SARS-CoV-2 by FDA under an Emergency Use Authorization (EUA).  This EUA will remain in effect (meaning this test can b e used) for the duration of  the COVID-19 declaration under Section 564(b)(1) of the Act, 21 U.S.C. section 360bbb-3(b)(1), unless the authorization is terminated or revoked sooner.     Influenza A by PCR NEGATIVE NEGATIVE Final   Influenza B by PCR NEGATIVE NEGATIVE Final    Comment: (NOTE) The Xpert Xpress SARS-CoV-2/FLU/RSV plus assay is intended as an aid in the diagnosis of influenza from Nasopharyngeal swab specimens and should not be used as a sole basis for treatment. Nasal washings and aspirates are unacceptable for Xpert Xpress SARS-CoV-2/FLU/RSV testing.  Fact Sheet for Patients: EntrepreneurPulse.com.au  Fact Sheet for Healthcare Providers: IncredibleEmployment.be  This test is not yet approved or cleared by the [place] FDA and has been authorized for detection and/or diagnosis of SARS-CoV-2 by FDA under an Emergency Use Authorization (EUA). This EUA will remain in effect (meaning this test can be used) for the duration of the COVID-19 declaration under Section 564(b)(1) of the Act, 21 U.S.C. section 360bbb-3(b)(1), unless the authorization is terminated or revoked.  Performed at [facility], [place] [address] [place], [place] [address]   Culture, blood (routine x 2) Call MD if unable to obtain prior to antibiotics being given     Status: None (Preliminary result)   Collection Time: 04/26/21  3:15 PM   Specimen: BLOOD  Result Value  Ref Range Status   Specimen Description BLOOD RIGHT ANTECUBITAL  Final   Special Requests   Final    BOTTLES DRAWN AEROBIC AND ANAEROBIC Blood Culture adequate volume   Culture  Setup Time   Final    AEROBIC BOTTLE ONLY NO ORGANISMS SEEN REINCUBATED 04/27/21@0050  BY TW Performed at [facility], [address], [place], [place] [address]    Culture PENDING  Incomplete   Report Status PENDING  Incomplete     Medications:    apixaban  2.5 mg Oral BID  azithromycin  500 mg Oral Daily   balsalazide  2,250 mg Oral BID   benzonatate  100 mg Oral TID   diltiazem  60 mg Oral Q8H   finasteride  5 mg Oral Daily   fluticasone furoate-vilanterol  1 puff Inhalation Daily   furosemide  20 mg Oral Daily   guaiFENesin  1,200 mg Oral BID   insulin aspart  0-9 Units Subcutaneous TID WC   levothyroxine  50 mcg Oral Daily   linagliptin  5 mg Oral Daily   methylPREDNISolone (SOLU-MEDROL) injection  80 mg Intravenous BID   oxybutynin  5 mg Oral Daily   pantoprazole  40 mg Oral BID   rosuvastatin  20 mg Oral Daily   cyanocobalamin  2,000 mcg Oral Daily   Continuous Infusions:  sodium chloride 125 mL/hr at 04/27/21 0216   azithromycin Stopped (04/26/21 1617)   cefTRIAXone (ROCEPHIN)  IV Stopped (04/26/21 1435)   cefTRIAXone (ROCEPHIN)  IV        LOS: 1 day   Charlynne Cousins  [facility]  04/27/2021, 7:25 AM

## 2021-04-28 LAB — CBC WITH DIFFERENTIAL/PLATELET
Abs Immature Granulocytes: 0.19 10*3/uL — ABNORMAL HIGH (ref 0.00–0.07)
Basophils Absolute: 0 10*3/uL (ref 0.0–0.1)
Basophils Relative: 0 %
Eosinophils Absolute: 0 10*3/uL (ref 0.0–0.5)
Eosinophils Relative: 0 %
HCT: 38.6 % — ABNORMAL LOW (ref 39.0–52.0)
Hemoglobin: 12.8 g/dL — ABNORMAL LOW (ref 13.0–17.0)
Immature Granulocytes: 1 %
Lymphocytes Relative: 2 %
Lymphs Abs: 0.4 10*3/uL — ABNORMAL LOW (ref 0.7–4.0)
MCH: 26.6 pg (ref 26.0–34.0)
MCHC: 33.2 g/dL (ref 30.0–36.0)
MCV: 80.2 fL (ref 80.0–100.0)
Monocytes Absolute: 0.4 10*3/uL (ref 0.1–1.0)
Monocytes Relative: 2 %
Neutro Abs: 20.3 10*3/uL — ABNORMAL HIGH (ref 1.7–7.7)
Neutrophils Relative %: 95 %
Platelets: 176 10*3/uL (ref 150–400)
RBC: 4.81 MIL/uL (ref 4.22–5.81)
RDW: 17 % — ABNORMAL HIGH (ref 11.5–15.5)
WBC: 21.4 10*3/uL — ABNORMAL HIGH (ref 4.0–10.5)
nRBC: 0 % (ref 0.0–0.2)

## 2021-04-28 LAB — BASIC METABOLIC PANEL
Anion gap: 12 (ref 5–15)
BUN: 40 mg/dL — ABNORMAL HIGH (ref 8–23)
CO2: 19 mmol/L — ABNORMAL LOW (ref 22–32)
Calcium: 8.4 mg/dL — ABNORMAL LOW (ref 8.9–10.3)
Chloride: 102 mmol/L (ref 98–111)
Creatinine, Ser: 1.43 mg/dL — ABNORMAL HIGH (ref 0.61–1.24)
GFR, Estimated: 49 mL/min — ABNORMAL LOW (ref >60)
Glucose, Bld: 243 mg/dL — ABNORMAL HIGH (ref 70–99)
Potassium: 3.3 mmol/L — ABNORMAL LOW (ref 3.5–5.1)
Sodium: 133 mmol/L — ABNORMAL LOW (ref 135–145)

## 2021-04-28 LAB — GLUCOSE, CAPILLARY
Glucose-Capillary: 163 mg/dL — ABNORMAL HIGH (ref 70–99)
Glucose-Capillary: 248 mg/dL — ABNORMAL HIGH (ref 70–99)
Glucose-Capillary: 241 mg/dL — ABNORMAL HIGH (ref 70–99)
Glucose-Capillary: 297 mg/dL — ABNORMAL HIGH (ref 70–99)

## 2021-04-28 MED ORDER — INSULIN DETEMIR 100 UNIT/ML ~~LOC~~ SOLN: 15.0000 [IU] | Freq: Two times a day (BID) | SUBCUTANEOUS | Status: DC

## 2021-04-28 MED ORDER — PREDNISONE 20 MG PO TABS
20.0000 mg | ORAL_TABLET | Freq: Every day | ORAL | Status: DC
  Administered 2021-04-29: 20 mg via ORAL
  Filled 2021-04-28: qty 1

## 2021-04-28 MED ORDER — INSULIN DETEMIR 100 UNIT/ML ~~LOC~~ SOLN
10.0000 [IU] | Freq: Two times a day (BID) | SUBCUTANEOUS | Status: DC
  Administered 2021-04-28 – 2021-04-29 (×2): 10 [IU] via SUBCUTANEOUS
  Filled 2021-04-28 (×3): qty 0.1

## 2021-04-28 MED ORDER — ENSURE ENLIVE PO LIQD
237.0000 mL | Freq: Two times a day (BID) | ORAL | Status: DC
  Administered 2021-04-29 – 2021-04-30 (×3): 237 mL via ORAL

## 2021-04-28 MED ORDER — POTASSIUM CHLORIDE CRYS ER 20 MEQ PO TBCR
40.0000 meq | EXTENDED_RELEASE_TABLET | Freq: Two times a day (BID) | ORAL | Status: AC
  Administered 2021-04-28: 40 meq via ORAL
  Administered 2021-04-28: 20 meq via ORAL
  Filled 2021-04-28 (×2): qty 2

## 2021-04-28 MED ORDER — LORAZEPAM 2 MG/ML IJ SOLN
0.5000 mg | Freq: Once | INTRAMUSCULAR | Status: AC
  Administered 2021-04-30: 0.5 mg via INTRAVENOUS
  Filled 2021-04-28: qty 1

## 2021-04-28 MED ORDER — POTASSIUM CHLORIDE CRYS ER 20 MEQ PO TBCR
20.0000 meq | EXTENDED_RELEASE_TABLET | Freq: Two times a day (BID) | ORAL | Status: DC
  Administered 2021-04-28: 20 meq via ORAL
  Filled 2021-04-28: qty 1

## 2021-04-28 MED ORDER — ADULT MULTIVITAMIN W/MINERALS CH
1.0000 | ORAL_TABLET | Freq: Every day | ORAL | Status: DC
  Administered 2021-04-28 – 2021-05-05 (×8): 1 via ORAL
  Filled 2021-04-28 (×7): qty 1

## 2021-04-28 NOTE — Progress Notes (Signed)
[facility] PROGRESS NOTE    Progress Note  Timothy Mclean  EOF:[id number] DOB: 20-Nov-1940 DOA: 04/26/2021 PCP: Derinda Late, MD     Brief Narrative:   Timothy Mclean is an 80 y.o. male past medical history significant for essential hypertension, chronic atrial fibrillation on Eliquis, CAD with CABG in 97, diabetes mellitus type 2 Barrett esophagus, chronic kidney disease stage II came in with difficulty breathing and productive cough that started about 3 weeks prior to admission, as per patient had a home test of COVID on September 15 that was positive, went to PCP who provided him Paxlovid for 5 days and a 7-day course of prednisone which she completed yesterday.  In the ED was found to be satting 87% placed on 2 L chest x-ray showed bilateral infiltrates White count of 16 bicarb of 1.5 SARS-CoV-2 PCR was positive was started on remdesivir Rocephin and azithromycin   Assessment/Plan:   Acute respiratory failure with hypoxia probably secondary to community-acquired pneumonia: COVID-19 infection less likely. Continue IV empiric Rocephin, lisinopril, inhalers and high-dose steroids.   This morning he is requiring 15 L of oxygen to keep saturations greater than 90%. Tachycardia has resolved, blood pressure is stable. Culture data has remained negative till date. He appears comfortable. Continue flutter valve and incentive spirometry we will asked respiratory to do pulmonary toileting  COVID-19 infection: Agree has completed 5-day course of treatment and its 10-day course of steroids is unlikely to be COVID-19 infection. Will start to wean down steroids.  Acute kidney injury on chronic kidney disease stage III: Unknown baseline admission 1.9 after IV fluid hydration is improved to 1. 4. Likely prerenal azotemia  Chronic atrial fibrillation: RVR has resolved, currently on diltiazem short acting plus metoprolol as needed continue Eliquis. Heart rate is  controlled  BPH: Continue Proscar and Flomax.  Essential hypertension: Continue to hold diuretic therapy continue diltiazem blood pressure stable.  Hypovolemic hyponatremia: In the setting of decreased oral intake and Lasix use. Started on IV fluids and sodium is improving slowly.  Diabetes mellitus type 2, uncontrolled: With a last A1c of 7.7 he is now on steroids which will make his blood glucose erratic. Cont. Long actin insulin, cont SSI.    DVT prophylaxis: Lovenox Family Communication:none Status is: Inpatient  Remains inpatient appropriate because:Hemodynamically unstable  Dispo: The patient is from: Home              Anticipated d/c is to: Home              Patient currently is not medically stable to d/c.   Difficult to place patient No   Code Status:     Code Status Orders  (From admission, onward)           Start     Ordered   04/26/21 1459  Full code  Continuous        04/26/21 1500           Code Status History     This patient has a current code status but no historical code status.         IV Access:   Peripheral IV   Procedures and diagnostic studies:   No results found.   Medical Consultants:   None.   Subjective:    [name] relates his breathing is unchanged compared to yesterday.  Objective:    Vitals:   04/28/21 0612 04/28/21 0806 04/28/21 1202 04/28/21 1225  BP: 126/65 (!) 114/57 97/75  Pulse:  78    Resp:  14 18   Temp:    97.6 F (36.4 C)  TempSrc:    Oral  SpO2:  96% 96%   Weight:      Height:       SpO2: 96 % O2 Flow Rate (L/min): 15 L/min   Intake/Output Summary (Last 24 hours) at 04/28/2021 1251 Last data filed at 04/28/2021 1100 Gross per 24 hour  Intake 820 ml  Output 1100 ml  Net -280 ml    Filed Weights   04/27/21 1519 04/28/21 0350  Weight: 74.2 kg 73.9 kg    Exam: General exam: In no acute distress. Respiratory system: Good air movement and clear to  auscultation. Cardiovascular system: S1 & S2 heard, RRR. No JVD. Gastrointestinal system: Abdomen is nondistended, soft and nontender.  Extremities: No pedal edema. Skin: No rashes, lesions or ulcers Psychiatry: Judgement and insight appear normal. Mood & affect appropriate.   Data Reviewed:    Labs: Basic Metabolic Panel: Recent Labs  Lab 04/26/21 0351 04/27/21 0239 04/27/21 0330 04/28/21 0953  NA 130* 132* 130* 133*  K 4.4 4.0 4.1 3.3*  CL 96*  --  98 102  CO2 22  --  18* 19*  GLUCOSE 193*  --  197* 243*  BUN 28*  --  30* 40*  CREATININE 1.56*  --  1.61* 1.43*  CALCIUM 9.4  --  8.6* 8.4*    GFR Estimated Creatinine Clearance: 41.6 mL/min (A) (by C-G formula based on SCr of 1.43 mg/dL (H)). Liver Function Tests: Recent Labs  Lab 04/26/21 0351  AST 26  ALT 26  ALKPHOS 59  BILITOT 1.3*  PROT 6.5  ALBUMIN 3.6    No results for input(s): LIPASE, AMYLASE in the last 168 hours. No results for input(s): AMMONIA in the last 168 hours. Coagulation profile No results for input(s): INR, PROTIME in the last 168 hours. COVID-19 Labs  No results for input(s): DDIMER, FERRITIN, LDH, CRP in the last 72 hours.  Lab Results  Component Value Date   SARSCOV2NAA POSITIVE (A) 04/26/2021    CBC: Recent Labs  Lab 04/26/21 0351 04/27/21 0239 04/27/21 0330 04/28/21 0953  WBC 16.4*  --  17.2* 21.4*  NEUTROABS 14.4*  --   --  20.3*  HGB 13.9 13.6 13.5 12.8*  HCT 42.1 40.0 40.6 38.6*  MCV 80.3  --  80.7 80.2  PLT 200  --  157 176    Cardiac Enzymes: No results for input(s): CKTOTAL, CKMB, CKMBINDEX, TROPONINI in the last 168 hours. BNP (last 3 results) No results for input(s): PROBNP in the last 8760 hours. CBG: Recent Labs  Lab 04/27/21 1247 04/27/21 1725 04/27/21 2024 04/28/21 0808 04/28/21 1138  GLUCAP 209* 213* 282* 163* 248*    D-Dimer: No results for input(s): DDIMER in the last 72 hours. Hgb A1c: Recent Labs    04/26/21 1501  HGBA1C 7.7*     Lipid Profile: No results for input(s): CHOL, HDL, LDLCALC, TRIG, CHOLHDL, LDLDIRECT in the last 72 hours. Thyroid function studies: No results for input(s): TSH, T4TOTAL, T3FREE, THYROIDAB in the last 72 hours.  Invalid input(s): FREET3 Anemia work up: No results for input(s): VITAMINB12, FOLATE, FERRITIN, TIBC, IRON, RETICCTPCT in the last 72 hours. Sepsis Labs: Recent Labs  Lab 04/26/21 0351 04/27/21 0330 04/28/21 0953  WBC 16.4* 17.2* 21.4*    Microbiology Recent Results (from the past 240 hour(s))  Resp Panel by RT-PCR (Flu A&B, Covid) Nasopharyngeal Swab  Status: Abnormal   Collection Time: 04/26/21  3:43 AM   Specimen: Nasopharyngeal Swab; Nasopharyngeal(NP) swabs in vial transport medium  Result Value Ref Range Status   SARS Coronavirus 2 by RT PCR POSITIVE (A) NEGATIVE Final    Comment: RESULT CALLED TO, READ BACK BY AND VERIFIED WITH: RN BRITTANY O. 04/26/21@4 :50 BY TW (NOTE) SARS-CoV-2 target nucleic acids are DETECTED.  The SARS-CoV-2 RNA is generally detectable in upper respiratory specimens during the acute phase of infection. Positive results are indicative of the presence of the identified virus, but do not rule out bacterial infection or co-infection with other pathogens not detected by the test. Clinical correlation with patient history and other diagnostic information is necessary to determine patient infection status. The expected result is Negative.  Fact Sheet for Patients: EntrepreneurPulse.com.au  Fact Sheet for Healthcare Providers: IncredibleEmployment.be  This test is not yet approved or cleared by the [place] FDA and  has been authorized for detection and/or diagnosis of SARS-CoV-2 by FDA under an Emergency Use Authorization (EUA).  This EUA will remain in effect (meaning this test can b e used) for the duration of  the COVID-19 declaration under Section 564(b)(1) of the Act, 21 U.S.C.  section 360bbb-3(b)(1), unless the authorization is terminated or revoked sooner.     Influenza A by PCR NEGATIVE NEGATIVE Final   Influenza B by PCR NEGATIVE NEGATIVE Final    Comment: (NOTE) The Xpert Xpress SARS-CoV-2/FLU/RSV plus assay is intended as an aid in the diagnosis of influenza from Nasopharyngeal swab specimens and should not be used as a sole basis for treatment. Nasal washings and aspirates are unacceptable for Xpert Xpress SARS-CoV-2/FLU/RSV testing.  Fact Sheet for Patients: EntrepreneurPulse.com.au  Fact Sheet for Healthcare Providers: IncredibleEmployment.be  This test is not yet approved or cleared by the [place] FDA and has been authorized for detection and/or diagnosis of SARS-CoV-2 by FDA under an Emergency Use Authorization (EUA). This EUA will remain in effect (meaning this test can be used) for the duration of the COVID-19 declaration under Section 564(b)(1) of the Act, 21 U.S.C. section 360bbb-3(b)(1), unless the authorization is terminated or revoked.  Performed at [facility], [place] [address] [place], [place] [address]   Culture, blood (routine x 2) Call MD if unable to obtain prior to antibiotics being given     Status: None (Preliminary result)   Collection Time: 04/26/21  3:15 PM   Specimen: BLOOD  Result Value Ref Range Status   Specimen Description BLOOD RIGHT ANTECUBITAL  Final   Special Requests   Final    BOTTLES DRAWN AEROBIC AND ANAEROBIC Blood Culture adequate volume   Culture   Final    NO GROWTH 2 DAYS Performed at [facility], [place] [address] [place], [place] [address]    Report Status PENDING  Incomplete  Culture, blood (routine x 2) Call MD if unable to obtain prior to antibiotics being given     Status: None (Preliminary result)   Collection Time: 04/26/21  3:57 PM   Specimen: BLOOD LEFT HAND  Result Value Ref Range Status   Specimen Description BLOOD LEFT HAND  Final    Special Requests   Final    BOTTLES DRAWN AEROBIC AND ANAEROBIC Blood Culture adequate volume   Culture   Final    NO GROWTH 2 DAYS Performed at [facility], [place] [address], [place],  [address]    Report Status PENDING  Incomplete     Medications:  apixaban  2.5 mg Oral BID   azithromycin  500 mg Oral Daily   balsalazide  2,250 mg Oral BID   benzonatate  100 mg Oral TID   diltiazem  60 mg Oral Q8H   finasteride  5 mg Oral Daily   fluticasone furoate-vilanterol  1 puff Inhalation Daily   guaiFENesin  1,200 mg Oral BID   insulin aspart  0-5 Units Subcutaneous QHS   insulin aspart  0-9 Units Subcutaneous TID WC   insulin aspart  3 Units Subcutaneous TID WC   insulin detemir  10 Units Subcutaneous BID   levothyroxine  50 mcg Oral Daily   linagliptin  5 mg Oral Daily   LORazepam  0.5 mg Intravenous Once   methylPREDNISolone (SOLU-MEDROL) injection  40 mg Intravenous BID   oxybutynin  5 mg Oral Daily   pantoprazole  40 mg Oral BID   potassium chloride  20 mEq Oral BID   rosuvastatin  20 mg Oral Daily   cyanocobalamin  2,000 mcg Oral Daily   Continuous Infusions:  cefTRIAXone (ROCEPHIN)  IV Stopped (04/28/21 1018)      LOS: 2 days   Charlynne Cousins  [facility]  04/28/2021, 12:51 PM

## 2021-04-28 NOTE — Evaluation (Signed)
Physical Therapy Evaluation Patient Details Name: Timothy Mclean MRN: [id number] DOB: [birth year] Today's Date: 04/28/2021  History of Present Illness  Timothy Mclean is a 80 y.o. male came with worsening of breathing symptoms including productive cough and SOB, COVID+ on 9/15 & treated at home by PCP. PMHx: HTN, chronic A. fib, CAD s/p CABG 1997, IIDM, asthma/COPD, GERD/Barrett esophagus, hypothyroidism, CKD stage II  Clinical Impression   Pt admitted secondary to problem above with deficits below. PTA patient was ambulating at home with cane. He had cognitive deficits, but per his wife he is much worse now than prior to getting COVID. Pt currently requires min assist to stand and will need 2 person assist to ambulate (may need to use rollator, which he also owns). Currently his AMPAC score reflects the need for 2 person assist for ambulation and therefore is indicating CIR vs SNF, however feel he will progress to one person assist prior to discharge and therefore recommending home with wife with HHPT.  Anticipate patient will benefit from PT to address problems listed below.Will continue to follow acutely to maximize functional mobility independence and safety.          Recommendations for follow up therapy are one component of a multi-disciplinary discharge planning process, led by the attending physician.  Recommendations may be updated based on patient status, additional functional criteria and insurance authorization.  Follow Up Recommendations Home health PT;Supervision/Assistance - 24 hour    Equipment Recommendations  None recommended by PT    Recommendations for Other Services       Precautions / Restrictions Precautions Precautions: Fall      Mobility  Bed Mobility Overal bed mobility: Needs Assistance Bed Mobility: Supine to Sit     Supine to sit: Min guard;HOB elevated     General bed mobility comments: OOB in recliner on arrival and at end of session     Transfers Overall transfer level: Needs assistance Equipment used: Straight cane Transfers: Sit to/from Stand Sit to Stand: Min assist Stand pivot transfers: Min assist       General transfer comment: repeated x 2 for strengthening, balance, repetition for carryover of technique; min A for powering into standing and balance in standing; utilizes cane in his rt hand and HHA on his left (once standing for balance)  Ambulation/Gait             General Gait Details: marching in place attempted and pt with poor ability to weight-shift and unload/lift each leg; Will require 2 person assist for lines, tubes, tec  Stairs            Wheelchair Mobility    Modified Rankin (Stroke Patients Only)       Balance Overall balance assessment: Needs assistance Sitting-balance support: Feet supported Sitting balance-Leahy Scale: Fair     Standing balance support: Single extremity supported;During functional activity Standing balance-Leahy Scale: Poor Standing balance comment: required assist from therapist                             Pertinent Vitals/Pain Pain Assessment: Faces Faces Pain Scale: Hurts a little bit Pain Location: back and Rt shoulder Pain Descriptors / Indicators: Aching Pain Intervention(s): Limited activity within patient's tolerance;Monitored during session    [place] expects to be discharged to:: Private residence Living Arrangements: Spouse/significant other Available Help at Discharge: Family;Available PRN/intermittently Type of Home: House Home Access: Level entry     Home Layout:  Two level Home Equipment: Walker - 4 wheels;Cane - single point      Prior Function Level of Independence: Independent with assistive device(s)         Comments: ambulates with cane, does not drive, mows lawn, vacuumes and does dishes - wife manages finances and medication     Hand Dominance   Dominant Hand: Right     Extremity/Trunk Assessment   Upper Extremity Assessment Upper Extremity Assessment: Defer to OT evaluation RUE Deficits / Details: shoulder ROM limited to ~ 90*, no hx of injury 3/5 MMT; elbow, wrist and hand ROM WFL and strength 5/5 RUE Sensation: WNL RUE Coordination: WNL    Lower Extremity Assessment Lower Extremity Assessment: Generalized weakness    Cervical / Trunk Assessment Cervical / Trunk Assessment: Kyphotic  Communication   Communication: HOH (wife reports he has one hearing aid but not here at the hospital)  Cognition Arousal/Alertness: Awake/alert Behavior During Therapy: WFL for tasks assessed/performed Overall Cognitive Status: Impaired/Different from baseline Area of Impairment: Following commands;Problem solving                       Following Commands: Follows one step commands with increased time     Problem Solving: Slow processing;Decreased initiation;Requires verbal cues;Requires tactile cues General Comments: pt's wife states that his cognitive deficits have worsened since having COVID      General Comments General comments (skin integrity, edema, etc.): on 15L HFNC with sats 95-97% when pleth reading well. During activity, artifact causes pleth to be inaccurate with ?drop in sats with sit to stand    Exercises     Assessment/Plan    PT Assessment Patient needs continued PT services  PT Problem List Decreased strength;Decreased balance;Decreased mobility;Decreased cognition;Decreased knowledge of use of DME;Decreased safety awareness;Cardiopulmonary status limiting activity       PT Treatment Interventions DME instruction;Gait training;Functional mobility training;Therapeutic activities;Therapeutic exercise;Balance training;Patient/family education    PT Goals (Current goals can be found in the Care Plan section)  Acute Rehab PT Goals Patient Stated Goal: per wife home soon PT Goal Formulation: With family Time For Goal Achievement:  05/12/21 Potential to Achieve Goals: Good    Frequency Min 3X/week   Barriers to discharge        Co-evaluation               AM-[name] PT "6 Clicks" Mobility  Outcome Measure Help needed turning from your back to your side while in a flat bed without using bedrails?: A Little Help needed moving from lying on your back to sitting on the side of a flat bed without using bedrails?: A Little Help needed moving to and from a bed to a chair (including a wheelchair)?: A Little Help needed standing up from a chair using your arms (e.g., wheelchair or bedside chair)?: A Little Help needed to walk in hospital room?: Total Help needed climbing 3-5 steps with a railing? : Total 6 Click Score: 14    End of Session Equipment Utilized During Treatment: Oxygen Activity Tolerance: Patient tolerated treatment well Patient left: in chair;with call bell/phone within reach;with chair alarm set;with family/visitor present;Other (comment) (telesitter)   PT Visit Diagnosis: Unsteadiness on feet (R26.81);Muscle weakness (generalized) (M62.81);Difficulty in walking, not elsewhere classified (R26.2)    Time: 3875-6433 PT Time Calculation (min) (ACUTE ONLY): 20 min   Charges:   PT Evaluation $PT Eval Moderate Complexity: 1 Mod           Arby Barrette, PT Pager (  [phone]) [place] 04/28/2021, 11:13 AM

## 2021-04-28 NOTE — Plan of Care (Signed)
  Problem: Clinical Measurements: Goal: Will remain free from infection Outcome: Progressing   Problem: Clinical Measurements: Goal: Will remain free from infection Outcome: Progressing

## 2021-04-28 NOTE — Progress Notes (Signed)
Initial Nutrition Assessment  DOCUMENTATION CODES:   Non-severe (moderate) malnutrition in context of chronic illness  INTERVENTION:  Ensure Enlive po BID, each supplement provides 350 kcal and 20 grams of protein  Magic cup TID with meals, each supplement provides 290 kcal and 9 grams of protein  MVI with minerals  NUTRITION DIAGNOSIS:   Moderate Malnutrition related to chronic illness as evidenced by moderate fat depletion, severe muscle depletion.  GOAL:   Patient will meet greater than or equal to 90% of their needs  MONITOR:   PO intake, Supplement acceptance, Weight trends, Labs  REASON FOR ASSESSMENT:   Malnutrition Screening Tool    ASSESSMENT:   Pt admitted with hypoxia, and bilateral PNA. PMH includes HTN, chronic afib, CAD s/p CABG, T2DM, asthma/COPD, GERD/Barrett esophagus, hypothyroidism, and CKD stage II. COVID + (9/15)   10/9 SLP bedside evaluation recommends dysphagia 3, nectar thick d/t mild aspiration risk  Pt appeared confused during visit and unable to get history on appetite and meal patterns PTA. He lives at home with his wife and uses a cane to get around. They prepare meals at home and like to eat out as well.   Meal Completion: 25-100% x 2 recorded meal.  Pt ate 100% of breakfast (pork sausage, french toast, and OJ) and 25% of lunch. Noted high blood sugar readings, pt being weaned from steroids. Pt would benefit from higher calorie, higher protein nutrition supplement. With further meal records, will reassess and adjust supplement as needed.   Unfortunately, there is a limited weight history for patient but per chart review, noted downward trends over the past 2 years.   Medications: zithromax, colazal, long acting insulin, SSI, tradjenta, protonix, Vitamin B12, rocephin  Labs: CBGs 163-282 x24 hrs  NUTRITION - FOCUSED PHYSICAL EXAM:  Flowsheet Row Most Recent Value  Orbital Region Moderate depletion  Upper Arm Region Severe depletion   Thoracic and Lumbar Region Moderate depletion  Buccal Region Moderate depletion  Temple Region Moderate depletion  Clavicle Bone Region Moderate depletion  Clavicle and Acromion Bone Region Severe depletion  Scapular Bone Region Moderate depletion  Dorsal Hand Moderate depletion  Patellar Region Severe depletion  Anterior Thigh Region Severe depletion  Posterior Calf Region Severe depletion  Edema (RD Assessment) None  Hair Reviewed  Eyes Reviewed  Mouth Reviewed  Skin Reviewed  Nails Reviewed       Diet Order:   Diet Order             DIET DYS 3 Room service appropriate? Yes with Assist; Fluid consistency: Nectar Thick  Diet effective now                   EDUCATION NEEDS:   No education needs have been identified at this time  Skin:  Skin Assessment: Skin Integrity Issues: Skin Integrity Issues:: Incisions Incisions: non-pressure wound sacrum  Last BM:  04/28/21  Height:   Ht Readings from Last 1 Encounters:  04/27/21 6' (1.829 m)    Weight:   Wt Readings from Last 1 Encounters:  04/28/21 73.9 kg    BMI:  Body mass index is 22.1 kg/m.  Estimated Nutritional Needs:   Kcal:  1900-2200  Protein:  100-115g  Fluid:  >1.9L  Clayborne Dana, RDN, LDN Clinical Nutrition

## 2021-04-28 NOTE — Evaluation (Signed)
Occupational Therapy Evaluation Patient Details Name: Timothy Mclean MRN: [id number] DOB: 10/17/1940 Today's Date: 04/28/2021   History of Present Illness Timothy Mclean is a 80 y.o. male came with worsening of breathing symptoms including productive cough and SOB, COVID+ on 9/15 & treated at home by PCP. PMHx: HTN, chronic A. fib, CAD s/p CABG 1997, IIDM, asthma/COPD, GERD/Barrett esophagus, hypothyroidism, CKD stage II   Clinical Impression   Timothy Mclean was mod I with ADLs and mobility PTA, he does not drive but does mow the lawn and complete house chores. 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 Upon evaluation py was oriented to self, and required increased cueing to complete simple functional tasks. Overall pt was min guard for bed mobility, min a for transfers and up to mod A for ADLs. Pt is limited by poor insight into safety, baseline cognitive deficits, and generalized weakness. Pt would benefit from continued OT acutely, recommend d/c to home with [place].      Recommendations for follow up therapy are one component of a multi-disciplinary discharge planning process, led by the attending physician.  Recommendations may be updated based on patient status, additional functional criteria and insurance authorization.   Follow Up Recommendations  Home health OT;Supervision/Assistance - 24 hour    Equipment Recommendations  None recommended by OT       Precautions / Restrictions Precautions Precautions: Fall      Mobility Bed Mobility Overal bed mobility: Needs Assistance Bed Mobility: Supine to Sit     Supine to sit: Min guard;HOB elevated     General bed mobility comments: OOB in recliner on arrival and at end of session    Transfers Overall transfer level: Needs assistance Equipment used: Straight cane Transfers: Sit to/from Stand Sit to Stand: Min assist Stand pivot transfers: Min assist       General transfer comment: min  A for powering into standing and balance in standing; pt attempting to reach of chair arm too early and causing fall risk/safety concern    Balance Overall balance assessment: Needs assistance Sitting-balance support: Feet supported Sitting balance-Leahy Scale: Fair     Standing balance support: Single extremity supported;During functional activity Standing balance-Leahy Scale: Poor Standing balance comment: required assist from therapist                           ADL either performed or assessed with clinical judgement   ADL Overall ADL's : Needs assistance/impaired Eating/Feeding: Independent;Sitting   Grooming: Set up;Sitting   Upper Body Bathing: Minimal assistance;Sitting Upper Body Bathing Details (indicate cue type and reason): for verbal cues Lower Body Bathing: Moderate assistance;Sit to/from stand   Upper Body Dressing : Set up;Sitting   Lower Body Dressing: Moderate assistance;Sit to/from stand   Toilet Transfer: Minimal assistance;Stand-pivot;BSC   Toileting- Clothing Manipulation and Hygiene: Min guard;Sitting/lateral lean       Functional mobility during ADLs: Minimal assistance;Cueing for safety General ADL Comments: Pt requires verbal cues for sequencing and safety, can be impulsvie with mobility but he is easily re-directed. transfers and mobility are unsteady requiring min A for balance and safety.     Vision Baseline Vision/History: 0 No visual deficits Ability to See in Adequate Light: 0 Adequate Patient Visual Report: No change from baseline Vision Assessment?: No apparent visual deficits     Perception     Praxis      Pertinent  Vitals/Pain Pain Assessment: Faces Faces Pain Scale: Hurts a little bit Pain Location: back and Rq shoulder     Hand Dominance Right   Extremity/Trunk Assessment Upper Extremity Assessment Upper Extremity Assessment: Defer to OT evaluation RUE Deficits / Details: shoulder ROM limited to ~ 90*, no hx of  injury 3/5 MMT; elbow, wrist and hand ROM WFL and strength 5/5 RUE Sensation: WNL RUE Coordination: WNL   Lower Extremity Assessment Lower Extremity Assessment: Generalized weakness   Cervical / Trunk Assessment Cervical / Trunk Assessment: Kyphotic   Communication Communication Communication: HOH   Cognition Arousal/Alertness: Awake/alert Behavior During Therapy: WFL for tasks assessed/performed Overall Cognitive Status: History of cognitive impairments - at baseline                                 General Comments: pt's wife states that his cognitive deficits have worsened since having COVID   General Comments  VSS on O2, SpO2 dropped ot 88% briefly but recovered with a few ddep breaths    Exercises     Shoulder Instructions      Home Living Family/patient expects to be discharged to:: Private residence Living Arrangements: Spouse/significant other Available Help at Discharge: Family;Available PRN/intermittently Type of Home: House Home Access: Level entry     Home Layout: Two level Alternate Level Stairs-Number of Steps: 1 (between kitchen and den) Alternate Level Stairs-Rails: Left Bathroom Shower/Tub: Walk-in shower;Door   Bathroom Toilet: Handicapped height     Home Equipment: Environmental consultant - 4 wheels;Cane - single point          Prior Functioning/Environment Level of Independence: Independent with assistive device(s)        Comments: ambulates with cane, does not drive, mows lawn, vacuumes and does dishes - wife manages finances and medication        OT Problem List: Decreased strength;Decreased range of motion;Decreased activity tolerance;Impaired balance (sitting and/or standing);Decreased safety awareness;Decreased knowledge of use of DME or AE;Decreased knowledge of precautions;Decreased cognition;Pain      OT Treatment/Interventions: Self-care/ADL training;Therapeutic exercise;Therapeutic activities;Patient/family education;Balance  training;DME and/or AE instruction    OT Goals(Current goals can be found in the care plan section) Acute Rehab OT Goals Patient Stated Goal: per wife home soon OT Goal Formulation: With patient Time For Goal Achievement: 05/12/21 Potential to Achieve Goals: Fair ADL Goals Pt Will Perform Grooming: with min guard assist;standing Pt Will Perform Lower Body Bathing: with min guard assist;sit to/from stand Pt Will Perform Lower Body Dressing: with min guard assist;sit to/from stand Pt Will Transfer to Toilet: with min guard assist;ambulating Pt Will Perform Tub/Shower Transfer: with min guard assist;ambulating  OT Frequency: Min 2X/week   Barriers to D/C:            Co-evaluation              AM-[name] OT "6 Clicks" Daily Activity     Outcome Measure Help from another person eating meals?: None Help from another person taking care of personal grooming?: A Little Help from another person toileting, which includes using toliet, bedpan, or urinal?: A Little Help from another person bathing (including washing, rinsing, drying)?: A Lot Help from another person to put on and taking off regular upper body clothing?: None Help from another person to put on and taking off regular lower body clothing?: A Lot 6 Click Score: 18   End of Session Equipment Utilized During Treatment: Gait belt;Oxygen Nurse Communication: Mobility status;Precautions  Activity Tolerance: Patient tolerated treatment well Patient left: in chair;with chair alarm set;with nursing/sitter in room;with family/visitor present;with call bell/phone within reach  OT Visit Diagnosis: Unsteadiness on feet (R26.81);Other abnormalities of gait and mobility (R26.89);Muscle weakness (generalized) (M62.81);Pain                Time: 7893-8101 OT Time Calculation (min): 22 min Charges:  OT General Charges $OT Visit: 1 Visit OT Evaluation $OT Eval Moderate Complexity: 1 Mod   Tristram Milian A Diron Haddon 04/28/2021, 10:43 AM

## 2021-04-29 DIAGNOSIS — E440 Moderate protein-calorie malnutrition: Secondary | ICD-10-CM | POA: Insufficient documentation

## 2021-04-29 LAB — BASIC METABOLIC PANEL
Anion gap: 11 (ref 5–15)
BUN: 46 mg/dL — ABNORMAL HIGH (ref 8–23)
CO2: 20 mmol/L — ABNORMAL LOW (ref 22–32)
Calcium: 8.6 mg/dL — ABNORMAL LOW (ref 8.9–10.3)
Chloride: 102 mmol/L (ref 98–111)
Creatinine, Ser: 1.44 mg/dL — ABNORMAL HIGH (ref 0.61–1.24)
GFR, Estimated: 49 mL/min — ABNORMAL LOW (ref >60)
Glucose, Bld: 221 mg/dL — ABNORMAL HIGH (ref 70–99)
Potassium: 4.1 mmol/L (ref 3.5–5.1)
Sodium: 133 mmol/L — ABNORMAL LOW (ref 135–145)

## 2021-04-29 LAB — LEGIONELLA PNEUMOPHILA SEROGP 1 UR AG: L. pneumophila Serogp 1 Ur Ag: NEGATIVE

## 2021-04-29 LAB — GLUCOSE, CAPILLARY
Glucose-Capillary: 177 mg/dL — ABNORMAL HIGH (ref 70–99)
Glucose-Capillary: 334 mg/dL — ABNORMAL HIGH (ref 70–99)
Glucose-Capillary: 392 mg/dL — ABNORMAL HIGH (ref 70–99)
Glucose-Capillary: 460 mg/dL — ABNORMAL HIGH (ref 70–99)
Glucose-Capillary: 484 mg/dL — ABNORMAL HIGH (ref 70–99)

## 2021-04-29 MED ORDER — INSULIN DETEMIR 100 UNIT/ML ~~LOC~~ SOLN
30.0000 [IU] | Freq: Two times a day (BID) | SUBCUTANEOUS | Status: DC
  Administered 2021-04-29 – 2021-04-30 (×3): 30 [IU] via SUBCUTANEOUS
  Filled 2021-04-29 (×5): qty 0.3

## 2021-04-29 MED ORDER — INSULIN ASPART 100 UNIT/ML IJ SOLN
5.0000 [IU] | Freq: Once | INTRAMUSCULAR | Status: AC
  Administered 2021-04-29: 5 [IU] via SUBCUTANEOUS

## 2021-04-29 MED ORDER — INSULIN ASPART 100 UNIT/ML IJ SOLN
12.0000 [IU] | Freq: Once | INTRAMUSCULAR | Status: AC
  Administered 2021-04-29: 12 [IU] via SUBCUTANEOUS

## 2021-04-29 MED ORDER — INSULIN ASPART 100 UNIT/ML IJ SOLN: 15.0000 [IU] | Freq: Once | INTRAMUSCULAR | Status: DC

## 2021-04-29 MED ORDER — PREDNISONE 5 MG PO TABS
5.0000 mg | ORAL_TABLET | Freq: Every day | ORAL | Status: AC
  Administered 2021-04-30: 5 mg via ORAL
  Filled 2021-04-29: qty 1

## 2021-04-29 MED ORDER — GUAIFENESIN-DM 100-10 MG/5ML PO SYRP
10.0000 mL | ORAL_SOLUTION | Freq: Four times a day (QID) | ORAL | Status: DC | PRN
  Administered 2021-04-29 – 2021-04-30 (×3): 10 mL via ORAL
  Filled 2021-04-29 (×3): qty 10

## 2021-04-29 NOTE — Discharge Instructions (Signed)

## 2021-04-29 NOTE — TOC Initial Note (Addendum)
Transition of Care [facility]) - Initial/Assessment Note    Patient Details  Name: Timothy Mclean MRN: [id number] Date of Birth: Feb 03, 1941  [facility] of [facility]) CM/SW Contact:    Timothy Carmine, RN Phone Number: 04/29/2021, 8:11 AM  Clinical Narrative:                 Timothy Mclean is a 80 year old patient that tested positive for COVID received paxlovid OP, now admitted with COVID pneumonia, presently on 2LPM of oxygen. , weaning down.  PT and OT evaluation revealed recommendation for home health. Spoke to son, who was in the room with the patient. Patient is agreeable to home health and wants a agency that works best with his insurance. He feels he does need a walker at home for assistance, he is using one in the hospital at present.  Reached out to Timothy Mclean to provide [facility] PT/OT.   CM will follow for needs, recommendations, and transitions  [facility] declined due to staffing [facility]- declined due to staffing [facility]- Cannot take  [facility]- Cannot accept due to staffing  Expected Discharge Plan: [place] Barriers to Discharge: Continued Medical Work up   Patient Goals and CMS Choice        Expected Discharge Plan and Services Expected Discharge Plan: [place]   Discharge Planning Services: CM Consult   Living arrangements for the past 2 months: Single Family Home                           HH Arranged: PT, OT          Prior Living Arrangements/Services Living arrangements for the past 2 months: Single Family Home Lives with:: Spouse Patient language and need for interpreter reviewed:: Yes        Need for Family Participation in Patient Care: Yes (Comment) Care giver support system in place?: Yes (comment)   Criminal Activity/Legal Involvement Pertinent to Current Situation/Hospitalization: No - Comment as needed  Activities of Daily Living Home Assistive Devices/Equipment: Hearing aid ADL Screening (condition at time of  admission) Patient's cognitive ability adequate to safely complete daily activities?: Yes Is the patient deaf or have difficulty hearing?: Yes Does the patient have difficulty seeing, even when wearing glasses/contacts?: No Does the patient have difficulty concentrating, remembering, or making decisions?: Yes Patient able to express need for assistance with ADLs?: Yes Does the patient have difficulty dressing or bathing?: Yes Independently performs ADLs?: No Does the patient have difficulty walking or climbing stairs?: Yes Weakness of Legs: Both Weakness of Arms/Hands: None  Permission Sought/Granted                  Emotional Assessment       Orientation: : Oriented to Self, Oriented to Place, Oriented to  Time Alcohol / Substance Use: Not Applicable Psych Involvement: No (comment)  Admission diagnosis:  Elevated troponin [R77.8] PNA (pneumonia) [J18.9] Longstanding persistent atrial fibrillation ([place]) [I48.11] Community acquired pneumonia, unspecified laterality [J18.9] COVID-19 virus infection [U07.1] Patient Active Problem List   Diagnosis Date Noted   Community acquired pneumonia 04/26/2021   PNA (pneumonia) 04/26/2021   COVID-19 virus infection    Leg swelling 45/36/4680   Systolic ejection murmur 32/06/2481   Weakness of lower extremity 09/07/2016   Barrett's esophagus 03/09/2014   Hypotension due to drugs 03/06/2014   Emphysema of lung ([place]) 02/19/2014   Cough 02/19/2014   H/O amiodarone therapy 02/16/2013  S/P CABG x 4    Permanent atrial fibrillation [facility]): CHA2DS2-VASc Score =4, On Eliquis. Diltiazem for rate control.    Hyperlipidemia associated with type 2 diabetes mellitus ([place])    Personal history of colonic polyps 09/10/2011   UC (ulcerative colitis) ([place]) 09/10/2011   Anticoagulant long-term use 09/10/2011   VITAMIN B12 DEFICIENCY 01/30/2009   Type 2 diabetes mellitus with complication - CAD 34/74/2595   GOUT 10/31/2007   Essential hypertension  10/31/2007   ACID REFLUX DISEASE 10/31/2007   CAD S/P PTCA-PCI then CABG x4 12/01/1995   ST elevation myocardial infarction (STEMI) of inferior wall, subsequent episode of care ([place]) 10/30/1985   PCP:  Timothy Late, MD Pharmacy:   [facility], [address] [place] [address] Phone: [phone] Fax: [phone]  [facility]) - [place], [place] [address] Phone: [phone] Fax: [phone]     Social Determinants of Health (SDOH) Interventions    Readmission Risk Interventions No flowsheet data found.

## 2021-04-29 NOTE — Progress Notes (Signed)
Speech Language Pathology Treatment: Dysphagia  Patient Details Name: Timothy Mclean MRN: [id number] DOB: [birth year] Today's Date: 04/29/2021 Time: 0910-0930 SLP Time Calculation (min) (ACUTE ONLY): 20 min  Assessment / Plan / Recommendation Clinical Impression  Pt seen at bedside for skilled ST intervention targeting goals for diet tolerance and advancement. Pt's son was present. Pt was awake and alert, pleasant and cooperative. He accepted trials of puree, solid, nectar thick, and thin liquid consistencies. Pt continues to demonstrate intermittent cough, even in the absence of PO intake. Son reports no obvious difficulty at meals, and that the cough "come and goes" throughout the day. Pt passed Yale swallow screen with consecutive boluses of thin water. Of note, pt was noted to have a cup of unthickened water at bedside upon arrival of SLP.  Recommend advancing diet to Dys3 (soft) and thin liquids, with adherence to safe swallow precautions. Pt should maintain upright position for ~30 minutes after meals due to diagnosis of Barrett's Esophagus. In addition, small bites/sips at slow rate is also recommended. SLP will continue to follow to assess tolerance of advanced textures.    HPI HPI: Pt is an 80 y.o. male who presented with productive cough and SOB. Pt's wife reported on admission that pt has mild dementia with memory problems but never had aspiration before and no choking or cough after eating regular food. CXR 10/8: New patchy and nodular bibasilar pulmonary opacity since August,  compatible with bilateral pneumonia. Aspiration also a  consideration.  PMH: HTN, chronic A. fib, CAD s/p CABG 1997, IIDM, asthma/COPD, GERD/Barrett esophagus, hypothyroidism, CKD stage II, positive COVID-19 test on 04/03/21.      SLP Plan  Continue with current plan of care      Recommendations for follow up therapy are one component of a multi-disciplinary discharge planning process, led by the attending  physician.  Recommendations may be updated based on patient status, additional functional criteria and insurance authorization.    Recommendations  Diet recommendations: Dysphagia 3 (mechanical soft);Thin liquid Liquids provided via: Straw;Cup Medication Administration: Whole meds with liquid (crush large pills if needed) Supervision: Patient able to self feed Compensations: Slow rate;Small sips/bites Postural Changes and/or Swallow Maneuvers: Seated upright 90 degrees;Upright 30-60 min after meal                Oral Care Recommendations: Oral care BID Follow up Recommendations: Other (comment) (TBD) SLP Visit Diagnosis: Dysphagia, unspecified (R13.10) Plan: Continue with current plan of care       [place]. Quentin Ore, [facility], [place] Speech Language Pathologist Office: [phone]  Shonna Chock  04/29/2021, 9:36 AM

## 2021-04-29 NOTE — Progress Notes (Signed)
[facility] PROGRESS NOTE    Progress Note  Timothy Mclean  HUD:[id number] DOB: [birth year] DOA: 04/26/2021 PCP: Derinda Late, MD     Brief Narrative:   Timothy Mclean is an 80 y.o. male past medical history significant for essential hypertension, chronic atrial fibrillation on Eliquis, CAD with CABG in 97, diabetes mellitus type 2 Barrett esophagus, chronic kidney disease stage II came in with difficulty breathing and productive cough that started about 3 weeks prior to admission, as per patient had a home test of COVID on September 15 that was positive, went to PCP who provided him Paxlovid for 5 days and a 7-day course of prednisone which she completed yesterday.  In the ED was found to be satting 87% placed on 2 L chest x-ray showed bilateral infiltrates White count of 16 bicarb of 1.5 SARS-CoV-2 PCR was positive was started on remdesivir Rocephin and azithromycin   Assessment/Plan:   Acute respiratory failure with hypoxia probably secondary to community-acquired pneumonia: COVID-19 infection less likely. Continue IV Rocephin, azithromycin, inhalers and steroids. Check saturations are improving requiring 9 L of high flow nasal cannula keep saturations greater 90%. Tachycardia has resolved blood pressure stable. Has remained negative till date. Continue flutter valve, incentive spirometry and pulmonary toileting. He is improving his physical exam he is moving more air rhonchi have improved.  COVID-19 infection: Agree has completed 5-day course of treatment and its 10-day course of steroids is unlikely to be COVID-19 infection. Continue to wean steroids.  Acute kidney injury on chronic kidney disease stage III: Unknown baseline admission 1.9 after IV fluid hydration is improved to 1. 4. Likely prerenal azotemia  Chronic atrial fibrillation: RVR has resolved, currently on diltiazem short acting plus metoprolol as needed continue Eliquis. Heart rate is  controlled  BPH: Continue Proscar and Flomax.  Essential hypertension: Continue to hold diuretic therapy continue diltiazem blood pressure stable.  Hypovolemic hyponatremia: In the setting of decreased oral intake and Lasix use. Started on IV fluids and sodium is improving slowly.  Diabetes mellitus type 2, uncontrolled: With a last A1c of 7.7 he is now on steroids which will make his blood glucose erratic. Continue long-acting insulin plus sliding scale and Linaglitipin    DVT prophylaxis: Lovenox Family Communication:none Status is: Inpatient  Remains inpatient appropriate because:Hemodynamically unstable  Dispo: The patient is from: Home              Anticipated d/c is to: Home              Patient currently is not medically stable to d/c.   Difficult to place patient No   Code Status:     Code Status Orders  (From admission, onward)           Start     Ordered   04/26/21 1459  Full code  Continuous        04/26/21 1500           Code Status History     This patient has a current code status but no historical code status.         IV Access:   Peripheral IV   Procedures and diagnostic studies:   No results found.   Medical Consultants:   None.   Subjective:    Timothy Mclean relates that his breathing is significantly better compared to yesterday.  Objective:    Vitals:   04/29/21 0028 04/29/21 0526 04/29/21 0534 04/29/21 0806  BP: 113/63 122/63  130/60  Pulse: 75 80 86 85  Resp: 18 (!) 22 20 (!) 21  Temp: 97.8 F (36.6 C) 97.7 F (36.5 C)  97.6 F (36.4 C)  TempSrc: Oral Oral  Axillary  SpO2: 96% 96% 93% 98%  Weight:      Height:       SpO2: 98 % O2 Flow Rate (L/min): 9 L/min   Intake/Output Summary (Last 24 hours) at 04/29/2021 0942 Last data filed at 04/29/2021 0658 Gross per 24 hour  Intake 360 ml  Output 1860 ml  Net -1500 ml    Filed Weights   04/27/21 1519 04/28/21 0350  Weight: 74.2 kg 73.9 kg     Exam: General exam: In no acute distress. Respiratory system: Good air movement and clear to auscultation. Cardiovascular system: S1 & S2 heard, RRR. No JVD. Gastrointestinal system: Abdomen is nondistended, soft and nontender.  Extremities: No pedal edema. Skin: No rashes, lesions or ulcers Psychiatry: Judgement and insight appear normal. Mood & affect appropriate.   Data Reviewed:    Labs: Basic Metabolic Panel: Recent Labs  Lab 04/26/21 0351 04/27/21 0239 04/27/21 0330 04/28/21 0953 04/29/21 0122  NA 130* 132* 130* 133* 133*  K 4.4 4.0 4.1 3.3* 4.1  CL 96*  --  98 102 102  CO2 22  --  18* 19* 20*  GLUCOSE 193*  --  197* 243* 221*  BUN 28*  --  30* 40* 46*  CREATININE 1.56*  --  1.61* 1.43* 1.44*  CALCIUM 9.4  --  8.6* 8.4* 8.6*    GFR Estimated Creatinine Clearance: 41.3 mL/min (A) (by C-G formula based on SCr of 1.44 mg/dL (H)). Liver Function Tests: Recent Labs  Lab 04/26/21 0351  AST 26  ALT 26  ALKPHOS 59  BILITOT 1.3*  PROT 6.5  ALBUMIN 3.6    No results for input(s): LIPASE, AMYLASE in the last 168 hours. No results for input(s): AMMONIA in the last 168 hours. Coagulation profile No results for input(s): INR, PROTIME in the last 168 hours. COVID-19 Labs  No results for input(s): DDIMER, FERRITIN, LDH, CRP in the last 72 hours.  Lab Results  Component Value Date   SARSCOV2NAA POSITIVE (A) 04/26/2021    CBC: Recent Labs  Lab 04/26/21 0351 04/27/21 0239 04/27/21 0330 04/28/21 0953  WBC 16.4*  --  17.2* 21.4*  NEUTROABS 14.4*  --   --  20.3*  HGB 13.9 13.6 13.5 12.8*  HCT 42.1 40.0 40.6 38.6*  MCV 80.3  --  80.7 80.2  PLT 200  --  157 176    Cardiac Enzymes: No results for input(s): CKTOTAL, CKMB, CKMBINDEX, TROPONINI in the last 168 hours. BNP (last 3 results) No results for input(s): PROBNP in the last 8760 hours. CBG: Recent Labs  Lab 04/28/21 0808 04/28/21 1138 04/28/21 1656 04/28/21 2044 04/29/21 0808  GLUCAP 163*  248* 241* 297* 177*    D-Dimer: No results for input(s): DDIMER in the last 72 hours. Hgb A1c: Recent Labs    04/26/21 1501  HGBA1C 7.7*    Lipid Profile: No results for input(s): CHOL, HDL, LDLCALC, TRIG, CHOLHDL, LDLDIRECT in the last 72 hours. Thyroid function studies: No results for input(s): TSH, T4TOTAL, T3FREE, THYROIDAB in the last 72 hours.  Invalid input(s): FREET3 Anemia work up: No results for input(s): VITAMINB12, FOLATE, FERRITIN, TIBC, IRON, RETICCTPCT in the last 72 hours. Sepsis Labs: Recent Labs  Lab 04/26/21 0351 04/27/21 0330 04/28/21 0953  WBC 16.4* 17.2* 21.4*    Microbiology Recent Results (  from the past 240 hour(s))  Resp Panel by RT-PCR (Flu A&B, Covid) Nasopharyngeal Swab     Status: Abnormal   Collection Time: 04/26/21  3:43 AM   Specimen: Nasopharyngeal Swab; Nasopharyngeal(NP) swabs in vial transport medium  Result Value Ref Range Status   SARS Coronavirus 2 by RT PCR POSITIVE (A) NEGATIVE Final    Comment: RESULT CALLED TO, READ BACK BY AND VERIFIED WITH: RN BRITTANY O. 04/26/21@4 :50 BY TW (NOTE) SARS-CoV-2 target nucleic acids are DETECTED.  The SARS-CoV-2 RNA is generally detectable in upper respiratory specimens during the acute phase of infection. Positive results are indicative of the presence of the identified virus, but do not rule out bacterial infection or co-infection with other pathogens not detected by the test. Clinical correlation with patient history and other diagnostic information is necessary to determine patient infection status. The expected result is Negative.  Fact Sheet for Patients: EntrepreneurPulse.com.au  Fact Sheet for Healthcare Providers: IncredibleEmployment.be  This test is not yet approved or cleared by the [place] FDA and  has been authorized for detection and/or diagnosis of SARS-CoV-2 by FDA under an Emergency Use Authorization (EUA).  This EUA  will remain in effect (meaning this test can b e used) for the duration of  the COVID-19 declaration under Section 564(b)(1) of the Act, 21 U.S.C. section 360bbb-3(b)(1), unless the authorization is terminated or revoked sooner.     Influenza A by PCR NEGATIVE NEGATIVE Final   Influenza B by PCR NEGATIVE NEGATIVE Final    Comment: (NOTE) The Xpert Xpress SARS-CoV-2/FLU/RSV plus assay is intended as an aid in the diagnosis of influenza from Nasopharyngeal swab specimens and should not be used as a sole basis for treatment. Nasal washings and aspirates are unacceptable for Xpert Xpress SARS-CoV-2/FLU/RSV testing.  Fact Sheet for Patients: EntrepreneurPulse.com.au  Fact Sheet for Healthcare Providers: IncredibleEmployment.be  This test is not yet approved or cleared by the [place] FDA and has been authorized for detection and/or diagnosis of SARS-CoV-2 by FDA under an Emergency Use Authorization (EUA). This EUA will remain in effect (meaning this test can be used) for the duration of the COVID-19 declaration under Section 564(b)(1) of the Act, 21 U.S.C. section 360bbb-3(b)(1), unless the authorization is terminated or revoked.  Performed at [facility], [place] [address] [place], [place] [address]   Culture, blood (routine x 2) Call MD if unable to obtain prior to antibiotics being given     Status: None (Preliminary result)   Collection Time: 04/26/21  3:15 PM   Specimen: BLOOD  Result Value Ref Range Status   Specimen Description BLOOD RIGHT ANTECUBITAL  Final   Special Requests   Final    BOTTLES DRAWN AEROBIC AND ANAEROBIC Blood Culture adequate volume   Culture   Final    NO GROWTH 3 DAYS Performed at [facility], [place] [address], [place], [place] [address]    Report Status PENDING  Incomplete  Culture, blood (routine x 2) Call MD if unable to obtain prior to antibiotics being given     Status: None (Preliminary  result)   Collection Time: 04/26/21  3:57 PM   Specimen: BLOOD LEFT HAND  Result Value Ref Range Status   Specimen Description BLOOD LEFT HAND  Final   Special Requests   Final    BOTTLES DRAWN AEROBIC AND ANAEROBIC Blood Culture adequate volume   Culture   Final    NO GROWTH 3 DAYS Performed at [facility], [place]  [address] [place], [place] [address]    Report Status PENDING  Incomplete  Expectorated Sputum Assessment w Gram Stain, Rflx to Resp Cult     Status: None (Preliminary result)   Collection Time: 04/28/21  8:24 PM   Specimen: SPU  Result Value Ref Range Status   Specimen Description SPUTUM  Final   Special Requests NONE  Final   Sputum evaluation   Final    THIS SPECIMEN IS ACCEPTABLE FOR SPUTUM CULTURE Performed at [facility], [address] [place], [place] [address]    Report Status PENDING  Incomplete  Culture, Respiratory w Gram Stain     Status: None (Preliminary result)   Collection Time: 04/28/21  8:24 PM   Specimen: SPU  Result Value Ref Range Status   Specimen Description SPUTUM  Final   Special Requests NONE Reflexed from [id number]  Final   Gram Stain   Final    FEW SQUAMOUS EPITHELIAL CELLS PRESENT MODERATE WBC SEEN NO ORGANISMS SEEN Performed at [facility], [place] [address], [place], [place] [address]    Culture PENDING  Incomplete   Report Status PENDING  Incomplete     Medications:    apixaban  2.5 mg Oral BID   azithromycin  500 mg Oral Daily   balsalazide  2,250 mg Oral BID   benzonatate  100 mg Oral TID   diltiazem  60 mg Oral Q8H   feeding supplement  237 mL Oral BID BM   finasteride  5 mg Oral Daily   fluticasone furoate-vilanterol  1 puff Inhalation Daily   guaiFENesin  1,200 mg Oral BID   insulin aspart  0-5 Units Subcutaneous QHS   insulin aspart  0-9 Units Subcutaneous TID WC   insulin aspart  3 Units Subcutaneous TID WC   insulin detemir  10 Units Subcutaneous BID   levothyroxine  50 mcg Oral Daily    linagliptin  5 mg Oral Daily   LORazepam  0.5 mg Intravenous Once   multivitamin with minerals  1 tablet Oral Daily   oxybutynin  5 mg Oral Daily   pantoprazole  40 mg Oral BID   predniSONE  20 mg Oral Q breakfast   rosuvastatin  20 mg Oral Daily   cyanocobalamin  2,000 mcg Oral Daily   Continuous Infusions:  cefTRIAXone (ROCEPHIN)  IV 2 g (04/29/21 0921)      LOS: 3 days   Charlynne Cousins  [facility]  04/29/2021, 9:42 AM

## 2021-04-29 NOTE — Progress Notes (Signed)
Physical Therapy Treatment Patient Details Name: Timothy Mclean MRN: [id number] DOB: 28-Feb-1941 Today's Date: 04/29/2021   History of Present Illness Timothy Mclean is a 80 y.o. male came with worsening of breathing symptoms including productive cough and SOB, COVID+ on 9/15 & treated at home by PCP. PMHx: HTN, chronic A. fib, CAD s/p CABG 1997, IIDM, asthma/COPD, GERD/Barrett esophagus, hypothyroidism, CKD stage II    PT Comments    Patient more confused today compared to 04/28/21 (wife present and in agreement). Difficult initially to direct his attention to walking as he was distracted by lines/tubes and wanting to hold them despite his need to hold RW with both hands. Once initiated ambulation, balance improved and pt able to maneuver RW around end of bed and to the door. Pt became "stuck" at the door as trying to help him turn around (180 degrees) and pt initially not cooperating. Wife encouraged him to turn and with assist to maneuver RW, pt walked back to his recliner with min assist +2nd person for lines and oxygen.     Recommendations for follow up therapy are one component of a multi-disciplinary discharge planning process, led by the attending physician.  Recommendations may be updated based on patient status, additional functional criteria and insurance authorization.  Follow Up Recommendations  Home health PT;Supervision/Assistance - 24 hour (if cognition improves and requires minguard assist or less)     Equipment Recommendations  None recommended by PT    Recommendations for Other Services       Precautions / Restrictions Precautions Precautions: Fall     Mobility  Bed Mobility                    Transfers Overall transfer level: Needs assistance Equipment used: Rolling walker (2 wheeled) Transfers: Sit to/from Stand Sit to Stand: Min assist         General transfer comment: posterior lean and confusion re: holding lines vs holding handles of RW (tech  had to take lines out of his hand repeatedly and repeatedly told to hold onto RW with both hands)  Ambulation/Gait Ambulation/Gait assistance: Min assist;+2 safety/equipment Gait Distance (Feet): 50 Feet Assistive device: Rolling walker (2 wheeled) Gait Pattern/deviations: Step-through pattern;Decreased stride length   Gait velocity interpretation: <1.8 ft/sec, indicate of risk for recurrent falls General Gait Details: pt at times required min assist for balance and at times for maneuvering RW in turns; +2 for O2 and monitor   Stairs             Wheelchair Mobility    Modified Rankin (Stroke Patients Only)       Balance Overall balance assessment: Needs assistance Sitting-balance support: Feet supported Sitting balance-Leahy Scale: Fair     Standing balance support: During functional activity;Bilateral upper extremity supported Standing balance-Leahy Scale: Poor Standing balance comment: required assist from therapist                            Cognition Arousal/Alertness: Awake/alert Behavior During Therapy: Restless (continuously rearranging/holding lines) Overall Cognitive Status: Impaired/Different from baseline Area of Impairment: Following commands;Problem solving;Orientation;Attention;Memory;Safety/judgement;Awareness                 Orientation Level: Place;Time;Situation Current Attention Level: Sustained Memory: Decreased short-term memory Following Commands: Follows one step commands with increased time Safety/Judgement: Decreased awareness of safety;Decreased awareness of deficits Awareness:  (pre-intellectual) Problem Solving: Slow processing;Decreased initiation;Requires verbal cues;Requires tactile cues;Difficulty sequencing General Comments: cognition worse  than 04/28/21; thought he was at home, going to a funeral, and then wanting to walk out to sit on the porch      Exercises      General Comments General comments (skin  integrity, edema, etc.): on 9L O2 on arrival with sats 97%; placed on 10L with ambulation with sats 92%      Pertinent Vitals/Pain Pain Assessment: Faces    Home Living                      Prior Function            PT Goals (current goals can now be found in the care plan section) Acute Rehab PT Goals Patient Stated Goal: per wife home soon Time For Goal Achievement: 05/12/21 Potential to Achieve Goals: Good Progress towards PT goals: Progressing toward goals    Frequency    Min 3X/week      PT Plan Current plan remains appropriate    Co-evaluation              AM-[name] PT "6 Clicks" Mobility   Outcome Measure  Help needed turning from your back to your side while in a flat bed without using bedrails?: A Little Help needed moving from lying on your back to sitting on the side of a flat bed without using bedrails?: A Little Help needed moving to and from a bed to a chair (including a wheelchair)?: A Little Help needed standing up from a chair using your arms (e.g., wheelchair or bedside chair)?: A Little Help needed to walk in hospital room?: Total Help needed climbing 3-5 steps with a railing? : Total 6 Click Score: 14    End of Session Equipment Utilized During Treatment: Oxygen Activity Tolerance: Treatment limited secondary to medical complications (Comment) (limited by confusion) Patient left: in chair;with call bell/phone within reach;with chair alarm set;with family/visitor present;Other (comment) (telesitter)   PT Visit Diagnosis: Unsteadiness on feet (R26.81);Muscle weakness (generalized) (M62.81);Difficulty in walking, not elsewhere classified (R26.2)     Time: 8315-1761 PT Time Calculation (min) (ACUTE ONLY): 18 min  Charges:  $Gait Training: 8-22 mins                      Arby Barrette, PT Pager [phone]    Rexanne Mano 04/29/2021, 3:20 PM

## 2021-04-30 ENCOUNTER — Inpatient Hospital Stay (HOSPITAL_COMMUNITY): Payer: PPO

## 2021-04-30 DIAGNOSIS — E440 Moderate protein-calorie malnutrition: Secondary | ICD-10-CM

## 2021-04-30 LAB — GLUCOSE, CAPILLARY
Glucose-Capillary: 123 mg/dL — ABNORMAL HIGH (ref 70–99)
Glucose-Capillary: 226 mg/dL — ABNORMAL HIGH (ref 70–99)
Glucose-Capillary: 302 mg/dL — ABNORMAL HIGH (ref 70–99)
Glucose-Capillary: 355 mg/dL — ABNORMAL HIGH (ref 70–99)

## 2021-04-30 LAB — EXPECTORATED SPUTUM ASSESSMENT W GRAM STAIN, RFLX TO RESP C

## 2021-04-30 LAB — C-REACTIVE PROTEIN: CRP: 17.6 mg/dL — ABNORMAL HIGH (ref <1.0)

## 2021-04-30 MED ORDER — ENSURE MAX PROTEIN PO LIQD
11.0000 [oz_av] | Freq: Every day | ORAL | Status: DC
  Administered 2021-05-01 – 2021-05-03 (×3): 11 [oz_av] via ORAL
  Filled 2021-04-30 (×6): qty 330

## 2021-04-30 MED ORDER — TRAZODONE HCL 50 MG PO TABS
25.0000 mg | ORAL_TABLET | Freq: Every evening | ORAL | Status: DC | PRN
  Administered 2021-04-30: 25 mg via ORAL
  Filled 2021-04-30: qty 1

## 2021-04-30 MED ORDER — MELATONIN 5 MG PO TABS
5.0000 mg | ORAL_TABLET | Freq: Every day | ORAL | Status: DC
  Administered 2021-04-30 – 2021-05-04 (×5): 5 mg via ORAL
  Filled 2021-04-30 (×5): qty 1

## 2021-04-30 MED ORDER — GLUCERNA SHAKE PO LIQD
237.0000 mL | Freq: Two times a day (BID) | ORAL | Status: DC
  Administered 2021-04-30 – 2021-05-05 (×11): 237 mL via ORAL

## 2021-04-30 NOTE — Progress Notes (Signed)
Occupational Therapy Treatment Patient Details Name: Timothy Mclean MRN: [id number] DOB: 04-09-1941 Today's Date: 04/30/2021   History of present illness Timothy Mclean is a 80 y.o. male came with worsening of breathing symptoms including productive cough and SOB, COVID+ on 9/15 & treated at home by PCP. PMHx: HTN, chronic A. fib, CAD s/p CABG 1997, IIDM, asthma/COPD, GERD/Barrett esophagus, hypothyroidism, CKD stage II   OT comments  Timothy Mclean is not making functional progress at this time, this session he required increased assistance for all mobility and ADLs completed. He was only oriented to self, more confused this session with difficulty command following compared to the initial evaluation. After bed>chair transfer with RW pt stated he needed to use the bathroom, and stood to have BM with min- mod A for balance and max A for hygiene. Pt would benefit from continued OT acutely. Discharge recommendation updated to SNF - spoke with family about the change and they agree based on his apparent decline and need for increased assistance.    Recommendations for follow up therapy are one component of a multi-disciplinary discharge planning process, led by the attending physician.  Recommendations may be updated based on patient status, additional functional criteria and insurance authorization.    Follow Up Recommendations  SNF;Supervision/Assistance - 24 hour (based on pt's need for incrased assistance and increased confusion, would recommend d/c to SNF prior to home; wife understands and agreeable to chage in recommendation.)    Equipment Recommendations  None recommended by OT    Recommendations for Other Services      Precautions / Restrictions Precautions Precautions: Fall Restrictions Weight Bearing Restrictions: No       Mobility Bed Mobility Overal bed mobility: Needs Assistance Bed Mobility: Supine to Sit     Supine to sit: Min assist     General bed mobility comments: min A  to bring hips toward the EOB - pt with difficulty problem solving    Transfers Overall transfer level: Needs assistance Equipment used: Rolling walker (2 wheeled) Transfers: Sit to/from Stand Sit to Stand: Mod assist         General transfer comment: mod A to stant this session; min A with short ambulation - required max verbal cues for confusion and command following    Balance Overall balance assessment: Needs assistance Sitting-balance support: Feet supported Sitting balance-Leahy Scale: Fair     Standing balance support: During functional activity;Bilateral upper extremity supported Standing balance-Leahy Scale: Poor                             ADL either performed or assessed with clinical judgement   ADL Overall ADL's : Needs assistance/impaired     Grooming: Set up;Sitting Grooming Details (indicate cue type and reason): verbal cues for initiation                 Toilet Transfer: Moderate assistance;Stand-pivot;Cueing for safety Toilet Transfer Details (indicate cue type and reason): simulated - but then pt had a BM. Mod A for sit<>stand and min A for pivoting with RW Toileting- Clothing Manipulation and Hygiene: Maximal assistance;Sit to/from stand Toileting - Clothing Manipulation Details (indicate cue type and reason): required incrased assistance this session, pt wtih BM after transfer; max A for clean up - pt not following commands well for hygiene in standing in an un-natural toileting environment (at recliner chair).     Functional mobility during ADLs: Minimal assistance;Moderate assistance General ADL Comments: pt requried  incrased assistance levels this session for all tasks assessed. Also more confused. Wife present and agrees.     Vision   Vision Assessment?: No apparent visual deficits   Perception     Praxis      Cognition Arousal/Alertness: Awake/alert Behavior During Therapy: WFL for tasks assessed/performed Overall  Cognitive Status: Impaired/Different from baseline Area of Impairment: Memory;Following commands;Safety/judgement;Awareness;Problem solving                 Orientation Level: Disoriented to;Place;Time;Situation Current Attention Level: Sustained Memory: Decreased short-term memory Following Commands: Follows one step commands with increased time Safety/Judgement: Decreased awareness of safety;Decreased awareness of deficits Awareness: Intellectual Problem Solving: Slow processing;Decreased initiation;Requires verbal cues;Requires tactile cues;Difficulty sequencing General Comments: cognition worse than ininital evaluation - pt thought he was at my house, fishing, going to the race, & needs to go to the doctor for his cough        Exercises     Shoulder Instructions       General Comments 4.5L O2 SpO2  >92%    Pertinent Vitals/ Pain       Pain Assessment: Faces Faces Pain Scale: No hurt   Frequency  Min 2X/week        Progress Toward Goals  OT Goals(current goals can now be found in the care plan section)  Progress towards OT goals: Not progressing toward goals - comment (decline in function and cogntion since evaluation)  Acute Rehab OT Goals Patient Stated Goal: per wife home soon OT Goal Formulation: With patient Time For Goal Achievement: 05/12/21 Potential to Achieve Goals: Fair ADL Goals Pt Will Perform Grooming: with min guard assist;standing Pt Will Perform Lower Body Bathing: with min guard assist;sit to/from stand Pt Will Perform Lower Body Dressing: with min guard assist;sit to/from stand Pt Will Transfer to Toilet: with min guard assist;ambulating Pt Will Perform Tub/Shower Transfer: with min guard assist;ambulating  Plan Discharge plan needs to be updated    Co-evaluation                 AM-[name] OT "6 Clicks" Daily Activity     Outcome Measure   Help from another person eating meals?: None Help from another person taking care of  personal grooming?: A Little Help from another person toileting, which includes using toliet, bedpan, or urinal?: A Little Help from another person bathing (including washing, rinsing, drying)?: A Lot Help from another person to put on and taking off regular upper body clothing?: None Help from another person to put on and taking off regular lower body clothing?: A Lot 6 Click Score: 18    End of Session Equipment Utilized During Treatment: Gait belt;Rolling walker;Oxygen  OT Visit Diagnosis: Unsteadiness on feet (R26.81);Other abnormalities of gait and mobility (R26.89);Muscle weakness (generalized) (M62.81);Pain   Activity Tolerance Patient tolerated treatment well   Patient Left in chair;with call bell/phone within reach;with chair alarm set;with family/visitor present   Nurse Communication Mobility status;Precautions (BM)        Time: 5364-6803 OT Time Calculation (min): 27 min  Charges: OT General Charges $OT Visit: 1 Visit OT Treatments $Self Care/Home Management : 23-37 mins   Prisha Hiley A Adyan Palau 04/30/2021, 3:32 PM

## 2021-04-30 NOTE — Progress Notes (Signed)
PROGRESS NOTE        PATIENT DETAILS Name: Timothy Mclean Age: 80 y.o. Sex: male Date of Birth: [birth year] Admit Date: 04/26/2021 Admitting Physician Lequita Halt, MD MGN:[id number], Collier Salina, MD  Brief Narrative: Patient is a 80 y.o. male with history of COVID-19 infection on 9/15 (completed back paxlovid/prednisone as an outpatient), dementia, atrial fibrillation on Eliquis, CAD s/p CABG, DM-2 who presented with shortness of breath-he was found to have acute hypoxic respiratory failure-due to community-acquired pneumonia-and subsequently admitted to the hospitalist service.  Subjective: Lying comfortably in bed-denies any chest pain or shortness of breath.  Objective: Vitals: Blood pressure 132/66, pulse 90, temperature 98.2 F (36.8 C), temperature source Oral, resp. rate 19, height 6' (1.829 m), weight 73.9 kg, SpO2 100 %.   Exam: Gen Exam: Mildly confused-following simple commands.  Spouse at bedside. HEENT:atraumatic, normocephalic Chest: B/L clear to auscultation anteriorly CVS:S1S2 regular Abdomen:soft non tender, non distended Extremities:no edema Neurology: Moving all 4 extremities. Skin: no rash  Pertinent Labs/Radiology: No new labs on 10/12.  10/8>>Blood culture: No growth 10/10>> sputum culture: Few yeast-pending final result  Assessment/Plan: Acute hypoxic respiratory failure due to community-acquired pneumonia: Probable post viral pneumonia-given recent history of COVID-19 infection.  Hypoxia slowly improving-down to around 6-7 L of HFNC.  On Rocephin/Zithromax.  Has been titrated off steroids.  Cultures are negative so far.  Check CRP/procalcitonin with a.m. labs-repeat chest x-ray tomorrow morning.  Recent COVID-19 infection: Not felt to have active COVID-19 infection at this point.  Completed outpatient treatment as noted above.  CKD stage IIIb: Creatinine close to baseline  History of ulcerative colitis: Continue balsalazide-appears  to be controlled.  GERD/Barrett's esophagus: Continue PPI-continue outpatient follow-up with GI.  Chronic atrial fibrillation: Rate controlled with diltiazem-anticoagulated with Eliquis.  HLD: Continue statin  Hypothyroidism: Continue Synthroid  DM-2 (A1c 7.7 on 10/8): CBGs slowly improving-was on steroids till recently-continue Levemir 30 units twice daily, with units of NovoLog with meals and SSI.  Reassess on 10/13.  Recent Labs    04/29/21 2350 04/30/21 0748 04/30/21 1141  GLUCAP 334* 123* 226*    Dementia with delirium: Supportive care-Per spouse-has not slept in several days-waxing and waning delirium-we will add melatonin and see how he does.  May need low-dose Seroquel if no response to melatonin.  Nutrition Status: Nutrition Problem: Moderate Malnutrition Etiology: chronic illness Signs/Symptoms: moderate fat depletion, severe muscle depletion Interventions: Ensure Enlive (each supplement provides 350kcal and 20 grams of protein), MVI, Magic cup   Procedures: None Consults: None DVT Prophylaxis: Eliquis Code Status:Full code Family Communication: Spouse at bedside  Time spent: 66 minutes-Greater than 50% of this time was spent in counseling, explanation of diagnosis, planning of further management, and coordination of care.  Diet: Diet Order             DIET DYS 3 Room service appropriate? Yes with Assist; Fluid consistency: Thin  Diet effective now                      Disposition Plan: Status is: Inpatient  Remains inpatient appropriate because:Inpatient level of care appropriate due to severity of illness  Dispo: The patient is from: Home              Anticipated d/c is to: Home  Patient currently is not medically stable to d/c.   Difficult to place patient No   Barriers to Discharge: Hypoxia with O2 requirement-still on IV antibiotics-not yet stable for discharge.  Antimicrobial agents: Anti-infectives (From admission, onward)     Start     Dose/Rate Route Frequency Ordered Stop   04/27/21 1000  remdesivir 100 mg in sodium chloride 0.9 % 100 mL IVPB  Status:  Discontinued       See Hyperspace for full Linked Orders Report.   100 mg 200 mL/hr over 30 Minutes Intravenous Daily 04/26/21 1348 04/26/21 1504   04/27/21 1000  cefTRIAXone (ROCEPHIN) 2 g in sodium chloride 0.9 % 100 mL IVPB        2 g 200 mL/hr over 30 Minutes Intravenous Every 24 hours 04/26/21 1500 05/02/21 0959   04/27/21 1000  azithromycin (ZITHROMAX) tablet 500 mg        500 mg Oral Daily 04/26/21 1500 05/02/21 0959   04/26/21 1500  remdesivir 200 mg in sodium chloride 0.9% 250 mL IVPB       See Hyperspace for full Linked Orders Report.   200 mg 580 mL/hr over 30 Minutes Intravenous Once 04/26/21 1348 04/26/21 1722   04/26/21 1400  cefTRIAXone (ROCEPHIN) 2 g in sodium chloride 0.9 % 100 mL IVPB  Status:  Discontinued        2 g 200 mL/hr over 30 Minutes Intravenous Every 24 hours 04/26/21 1348 04/27/21 0841   04/26/21 1400  azithromycin (ZITHROMAX) 500 mg in sodium chloride 0.9 % 250 mL IVPB  Status:  Discontinued        500 mg 250 mL/hr over 60 Minutes Intravenous Every 24 hours 04/26/21 1348 04/27/21 0840        MEDICATIONS: Scheduled Meds:  apixaban  2.5 mg Oral BID   azithromycin  500 mg Oral Daily   balsalazide  2,250 mg Oral BID   benzonatate  100 mg Oral TID   diltiazem  60 mg Oral Q8H   feeding supplement (GLUCERNA SHAKE)  237 mL Oral BID BM   finasteride  5 mg Oral Daily   fluticasone furoate-vilanterol  1 puff Inhalation Daily   guaiFENesin  1,200 mg Oral BID   insulin aspart  0-5 Units Subcutaneous QHS   insulin aspart  0-9 Units Subcutaneous TID WC   insulin aspart  3 Units Subcutaneous TID WC   insulin detemir  30 Units Subcutaneous BID   levothyroxine  50 mcg Oral Daily   linagliptin  5 mg Oral Daily   multivitamin with minerals  1 tablet Oral Daily   oxybutynin  5 mg Oral Daily   pantoprazole  40 mg Oral BID    Ensure Max Protein  11 oz Oral QHS   rosuvastatin  20 mg Oral Daily   cyanocobalamin  2,000 mcg Oral Daily   Continuous Infusions:  cefTRIAXone (ROCEPHIN)  IV 2 g (04/30/21 0847)   PRN Meds:.acetaminophen **OR** acetaminophen, albuterol, guaiFENesin-dextromethorphan, metoprolol tartrate, ondansetron **OR** ondansetron (ZOFRAN) IV, senna-docusate   I have personally reviewed following labs and imaging studies  LABORATORY DATA: CBC: Recent Labs  Lab 04/26/21 0351 04/27/21 0239 04/27/21 0330 04/28/21 0953  WBC 16.4*  --  17.2* 21.4*  NEUTROABS 14.4*  --   --  20.3*  HGB 13.9 13.6 13.5 12.8*  HCT 42.1 40.0 40.6 38.6*  MCV 80.3  --  80.7 80.2  PLT 200  --  157 045    Basic Metabolic Panel: Recent Labs  Lab 04/26/21 0351  04/27/21 0239 04/27/21 0330 04/28/21 0953 04/29/21 0122  NA 130* 132* 130* 133* 133*  K 4.4 4.0 4.1 3.3* 4.1  CL 96*  --  98 102 102  CO2 22  --  18* 19* 20*  GLUCOSE 193*  --  197* 243* 221*  BUN 28*  --  30* 40* 46*  CREATININE 1.56*  --  1.61* 1.43* 1.44*  CALCIUM 9.4  --  8.6* 8.4* 8.6*    GFR: Estimated Creatinine Clearance: 41.3 mL/min (A) (by C-G formula based on SCr of 1.44 mg/dL (H)).  Liver Function Tests: Recent Labs  Lab 04/26/21 0351  AST 26  ALT 26  ALKPHOS 59  BILITOT 1.3*  PROT 6.5  ALBUMIN 3.6   No results for input(s): LIPASE, AMYLASE in the last 168 hours. No results for input(s): AMMONIA in the last 168 hours.  Coagulation Profile: No results for input(s): INR, PROTIME in the last 168 hours.  Cardiac Enzymes: No results for input(s): CKTOTAL, CKMB, CKMBINDEX, TROPONINI in the last 168 hours.  BNP (last 3 results) No results for input(s): PROBNP in the last 8760 hours.  Lipid Profile: No results for input(s): CHOL, HDL, LDLCALC, TRIG, CHOLHDL, LDLDIRECT in the last 72 hours.  Thyroid Function Tests: No results for input(s): TSH, T4TOTAL, FREET4, T3FREE, THYROIDAB in the last 72 hours.  Anemia Panel: No  results for input(s): VITAMINB12, FOLATE, FERRITIN, TIBC, IRON, RETICCTPCT in the last 72 hours.  Urine analysis: No results found for: COLORURINE, APPEARANCEUR, LABSPEC, PHURINE, GLUCOSEU, HGBUR, BILIRUBINUR, KETONESUR, PROTEINUR, UROBILINOGEN, NITRITE, LEUKOCYTESUR  Sepsis Labs: Lactic Acid, Venous No results found for: LATICACIDVEN  MICROBIOLOGY: Recent Results (from the past 240 hour(s))  Resp Panel by RT-PCR (Flu A&B, Covid) Nasopharyngeal Swab     Status: Abnormal   Collection Time: 04/26/21  3:43 AM   Specimen: Nasopharyngeal Swab; Nasopharyngeal(NP) swabs in vial transport medium  Result Value Ref Range Status   SARS Coronavirus 2 by RT PCR POSITIVE (A) NEGATIVE Final    Comment: RESULT CALLED TO, READ BACK BY AND VERIFIED WITH: RN BRITTANY O. 04/26/21@4 :50 BY TW (NOTE) SARS-CoV-2 target nucleic acids are DETECTED.  The SARS-CoV-2 RNA is generally detectable in upper respiratory specimens during the acute phase of infection. Positive results are indicative of the presence of the identified virus, but do not rule out bacterial infection or co-infection with other pathogens not detected by the test. Clinical correlation with patient history and other diagnostic information is necessary to determine patient infection status. The expected result is Negative.  Fact Sheet for Patients: EntrepreneurPulse.com.au  Fact Sheet for Healthcare Providers: IncredibleEmployment.be  This test is not yet approved or cleared by the [place] FDA and  has been authorized for detection and/or diagnosis of SARS-CoV-2 by FDA under an Emergency Use Authorization (EUA).  This EUA will remain in effect (meaning this test can b e used) for the duration of  the COVID-19 declaration under Section 564(b)(1) of the Act, 21 U.S.C. section 360bbb-3(b)(1), unless the authorization is terminated or revoked sooner.     Influenza A by PCR NEGATIVE NEGATIVE Final    Influenza B by PCR NEGATIVE NEGATIVE Final    Comment: (NOTE) The Xpert Xpress SARS-CoV-2/FLU/RSV plus assay is intended as an aid in the diagnosis of influenza from Nasopharyngeal swab specimens and should not be used as a sole basis for treatment. Nasal washings and aspirates are unacceptable for Xpert Xpress SARS-CoV-2/FLU/RSV testing.  Fact Sheet for Patients: EntrepreneurPulse.com.au  Fact Sheet for Healthcare Providers: IncredibleEmployment.be  This  test is not yet approved or cleared by the [place] and has been authorized for detection and/or diagnosis of SARS-CoV-2 by FDA under an Emergency Use Authorization (EUA). This EUA will remain in effect (meaning this test can be used) for the duration of the COVID-19 declaration under Section 564(b)(1) of the Act, 21 U.S.C. section 360bbb-3(b)(1), unless the authorization is terminated or revoked.  Performed at [facility], [place] [address], [place], [place] [address]   Culture, blood (routine x 2) Call MD if unable to obtain prior to antibiotics being given     Status: None (Preliminary result)   Collection Time: 04/26/21  3:15 PM   Specimen: BLOOD  Result Value Ref Range Status   Specimen Description BLOOD RIGHT ANTECUBITAL  Final   Special Requests   Final    BOTTLES DRAWN AEROBIC AND ANAEROBIC Blood Culture adequate volume   Culture   Final    NO GROWTH 4 DAYS Performed at [facility], [place] [address] [place], [place] [address]    Report Status PENDING  Incomplete  Culture, blood (routine x 2) Call MD if unable to obtain prior to antibiotics being given     Status: None (Preliminary result)   Collection Time: 04/26/21  3:57 PM   Specimen: BLOOD LEFT HAND  Result Value Ref Range Status   Specimen Description BLOOD LEFT HAND  Final   Special Requests   Final    BOTTLES DRAWN AEROBIC AND ANAEROBIC Blood Culture adequate volume   Culture   Final    NO GROWTH 4  DAYS Performed at [facility], [place] [address] [place], [place] [address]    Report Status PENDING  Incomplete  Expectorated Sputum Assessment w Gram Stain, Rflx to Resp Cult     Status: None   Collection Time: 04/28/21  8:24 PM   Specimen: SPU  Result Value Ref Range Status   Specimen Description SPUTUM  Final   Special Requests NONE  Final   Sputum evaluation   Final    THIS SPECIMEN IS ACCEPTABLE FOR SPUTUM CULTURE Performed at [facility], [address], [place], [place] [address]    Report Status 04/30/2021 FINAL  Final  Culture, Respiratory w Gram Stain     Status: None (Preliminary result)   Collection Time: 04/28/21  8:24 PM   Specimen: SPU  Result Value Ref Range Status   Specimen Description SPUTUM  Final   Special Requests NONE Reflexed from [id number]  Final   Gram Stain   Final    FEW SQUAMOUS EPITHELIAL CELLS PRESENT MODERATE WBC SEEN NO ORGANISMS SEEN    Culture   Final    FEW YEAST IDENTIFICATION TO FOLLOW Performed at [facility], [place] [address] [place], [place] [address]    Report Status PENDING  Incomplete    RADIOLOGY STUDIES/RESULTS: No results found.   LOS: 4 days   Oren Binet, MD  [facility]    To contact the attending provider between 7A-7P or the covering provider during after hours 7P-7A, please log into the web site [email or web address] and access using universal  password for that web site. If you do not have the password, please call the hospital operator.  04/30/2021, 3:29 PM

## 2021-04-30 NOTE — Progress Notes (Signed)
Nutrition Follow-up  DOCUMENTATION CODES:   Non-severe (moderate) malnutrition in context of chronic illness  INTERVENTION:   -Continue Magic cup TID with meals, each supplement provides 290 kcal and 9 grams of protein  -D/c Ensure Enlive po BID, each supplement provides 350 kcal and 20 grams of protein  -Glucerna Shake po BID, each supplement provides 220 kcal and 10 grams of protein  -Ensure Max po daily, each supplement provides 150 kcal and 30 grams of protein.   -Continue MVI with minerals daily  NUTRITION DIAGNOSIS:   Moderate Malnutrition related to chronic illness as evidenced by moderate fat depletion, severe muscle depletion.  Ongoing  GOAL:   Patient will meet greater than or equal to 90% of their needs  Progressing   MONITOR:   PO intake, Supplement acceptance, Weight trends, Labs  REASON FOR ASSESSMENT:   Malnutrition Screening Tool    ASSESSMENT:   Pt admitted with hypoxia, and bilateral PNA. PMH includes HTN, chronic afib, CAD s/p CABG, T2DM, asthma/COPD, GERD/Barrett esophagus, hypothyroidism, and CKD stage II.  10/9 SLP bedside evaluation recommends dysphagia 3, nectar thick d/t mild aspiration risk 10/10- s/p BSE- advanced diet to dysphagia 3 diet with thin liquids  Reviewed I/O's: -3 L x 24 hours and -4.5 L since admission  UOP: 3.3 L x 24 hours  Pt unavailable at time of visit. Attempted to speak with pt via call to hospital room phone, however, unable to reach.   Per SLP notes, plan for MBSS tomorrow.   Pt with improved oral intake. Noted meal completion 25-100%. Pt consuming Ensure Enlive supplements, but noted persistent hypoglycemia. Will adjust nutritional supplements now that PO intake has improved.    Medications reviewed and include cardizem and vitamin B-12.  Labs reviewed: CBGS: [id number] (inpatient orders for glycemic control are 5 mg linagliptin daily, 0-5 units insulin aspart daily at bedtime, 3 units insulin aspart TID with meals,  and 30 units insulin determir daily).    Diet Order:   Diet Order             DIET DYS 3 Room service appropriate? Yes with Assist; Fluid consistency: Thin  Diet effective now                   EDUCATION NEEDS:   No education needs have been identified at this time  Skin:  Skin Assessment: Skin Integrity Issues: Skin Integrity Issues:: Incisions Incisions: non-pressure wound sacrum  Last BM:  04/29/21  Height:   Ht Readings from Last 1 Encounters:  04/27/21 6' (1.829 m)    Weight:   Wt Readings from Last 1 Encounters:  04/28/21 73.9 kg   BMI:  Body mass index is 22.1 kg/m.  Estimated Nutritional Needs:   Kcal:  1900-2200  Protein:  100-115g  Fluid:  >1.9L    Loistine Chance, RD, LDN, [place] Registered Dietitian II Certified Diabetes Care and Education Specialist Please refer to [facility] for RD and/or RD on-call/weekend/after hours pager

## 2021-04-30 NOTE — Progress Notes (Signed)
Speech Language Pathology Treatment: Dysphagia  Patient Details Name: Timothy Mclean MRN: [id number] DOB: Apr 24, 1941 Today's Date: 04/30/2021 Time: 1203-1217 SLP Time Calculation (min) (ACUTE ONLY): 14 min  Assessment / Plan / Recommendation Clinical Impression  Pt was seen for dysphagia treatment with his wife present. Pt's wife expressed that the pt continues to be confused and "has been fishing all day". Pt's wife reported that the pt still continues to demonstrate intermittent coughing during meals, but that this has improved. Pt consumed trials of thin liquids, puree, and regular texture solids. Pt continues to demonstrate inconsistent coughing with p.o. intake. However, oral phase was [facility]. Pt does have a baseline cough, and SLP anticipates that all of his coughing with p.o .may be not be due to aspiration. However, a modified barium swallow study will be conducted to rule out aspiration. It was originally planned for today at 1430, but will be deferred until 10/13 due to radiology's schedule.    HPI HPI: Pt is an 80 y.o. male who presented with productive cough and SOB. Pt's wife reported on admission that pt has mild dementia with memory problems but never had aspiration before and no choking or cough after eating regular food. CXR 10/8: New patchy and nodular bibasilar pulmonary opacity since August,  compatible with bilateral pneumonia. Aspiration also a  consideration.  PMH: HTN, chronic A. fib, CAD s/p CABG 1997, IIDM, asthma/COPD, GERD/Barrett esophagus, hypothyroidism, CKD stage II, positive COVID-19 test on 04/03/21.      SLP Plan  Continue with current plan of care      Recommendations for follow up therapy are one component of a multi-disciplinary discharge planning process, led by the attending physician.  Recommendations may be updated based on patient status, additional functional criteria and insurance authorization.    Recommendations  Diet recommendations: Dysphagia 3  (mechanical soft);Thin liquid Liquids provided via: Cup;Straw Medication Administration: Whole meds with puree Supervision: Patient able to self feed Compensations: Slow rate;Small sips/bites Postural Changes and/or Swallow Maneuvers: Seated upright 90 degrees;Upright 30-60 min after meal                Oral Care Recommendations: Oral care BID Follow up Recommendations: None SLP Visit Diagnosis: Dysphagia, unspecified (R13.10) Plan: Continue with current plan of care       Zaiyah Sottile I. Hardin Negus, [place], [place] Office number [phone] Pager [place]  04/30/2021, 2:03 PM

## 2021-04-30 NOTE — TOC Initial Note (Signed)
Transition of Care [facility]) - Initial/Assessment Note    Patient Details  Name: Timothy Mclean MRN: [id number] Date of Birth: 02-25-1941  Transition of Care [facility]) CM/SW Contact:    Cyndi Bender, RN Phone Number: 04/30/2021, 2:50 PM  Clinical Narrative:  Orders for [place]. Spoke to patient's son, Cale, regarding transition needs. Son expressed concern about patient needing more care then spouse can provide. Orders for [facility] PT/OT. Alvis Lemmings accepted referral. Will continue to follow for progression with therapy to see if patient will need skilled nursing rehab.    Expected Discharge Plan: [place] Barriers to Discharge: Continued Medical Work up   Patient Goals and CMS Choice        Expected Discharge Plan and Services Expected Discharge Plan: [place]   Discharge Planning Services: CM Consult Post Acute Care Choice: Home Health, Durable Medical Equipment Living arrangements for the past 2 months: Single Family Home                 DME Arranged: [place] rolling DME Agency: [employer] Date DME Agency Contacted: 04/29/21 Time DME Agency Contacted: [phone] Representative spoke with at DME Agency: Freda Munro HH Arranged: PT, OT [place] Agency: [place] Date [place]: 04/30/21 Time [place]: 1332 Representative spoke with at [place]: [place] Arrangements/Services Living arrangements for the past 2 months: [place] with:: Spouse Patient language and need for interpreter reviewed:: Yes Do you feel safe going back to the place where you live?: Yes      Need for Family Participation in Patient Care: Yes (Comment) Care giver support system in place?: Yes (comment)   Criminal Activity/Legal Involvement Pertinent to Current Situation/Hospitalization: No - Comment as needed  Activities of Daily Living Home Assistive Devices/Equipment: Hearing aid ADL Screening (condition at time of  admission) Patient's cognitive ability adequate to safely complete daily activities?: Yes Is the patient deaf or have difficulty hearing?: Yes Does the patient have difficulty seeing, even when wearing glasses/contacts?: No Does the patient have difficulty concentrating, remembering, or making decisions?: Yes Patient able to express need for assistance with ADLs?: Yes Does the patient have difficulty dressing or bathing?: Yes Independently performs ADLs?: No Does the patient have difficulty walking or climbing stairs?: Yes Weakness of Legs: Both Weakness of Arms/Hands: None  Permission Sought/Granted Permission sought to share information with : Facility Art therapist granted to share information with : Yes, Verbal Permission Granted     Permission granted to share info w AGENCY: home health and DME        Emotional Assessment       Orientation: : Oriented to Self, Oriented to Place, Oriented to  Time Alcohol / Substance Use: Not Applicable Psych Involvement: No (comment)  Admission diagnosis:  Elevated troponin [R77.8] PNA (pneumonia) [J18.9] Longstanding persistent atrial fibrillation ([place]) [I48.11] Community acquired pneumonia, unspecified laterality [J18.9] COVID-19 virus infection [U07.1] Patient Active Problem List   Diagnosis Date Noted   Malnutrition of moderate degree 04/29/2021   Community acquired pneumonia 04/26/2021   PNA (pneumonia) 04/26/2021   COVID-19 virus infection    Leg swelling 50/38/8828   Systolic ejection murmur 00/34/9179   Weakness of lower extremity 09/07/2016   Barrett's esophagus 03/09/2014   Hypotension due to drugs 03/06/2014   Emphysema of lung ([place]) 02/19/2014   Cough 02/19/2014   H/O amiodarone therapy 02/16/2013   S/P CABG x 4    Permanent atrial fibrillation (  [place]): CHA2DS2-VASc Score =4, On Eliquis. Diltiazem for rate control.    Hyperlipidemia associated with type 2 diabetes mellitus ([place])    Personal history  of colonic polyps 09/10/2011   UC (ulcerative colitis) ([place]) 09/10/2011   Anticoagulant long-term use 09/10/2011   VITAMIN B12 DEFICIENCY 01/30/2009   Type 2 diabetes mellitus with complication - CAD 36/46/8032   GOUT 10/31/2007   Essential hypertension 10/31/2007   ACID REFLUX DISEASE 10/31/2007   CAD S/P PTCA-PCI then CABG x4 12/01/1995   ST elevation myocardial infarction (STEMI) of inferior wall, subsequent episode of care ([place]) 10/30/1985   PCP:  Derinda Late, MD Pharmacy:   [facility], [place] [address] [place] [address] Phone: [phone] Fax: [phone]  [place] [facility]) - [place], [place] [address] Phone: [phone] Fax: [phone]     Social Determinants of Health (SDOH) Interventions    Readmission Risk Interventions No flowsheet data found.

## 2021-05-01 ENCOUNTER — Inpatient Hospital Stay (HOSPITAL_COMMUNITY): Payer: PPO

## 2021-05-01 DIAGNOSIS — R0603 Acute respiratory distress: Secondary | ICD-10-CM

## 2021-05-01 LAB — C-REACTIVE PROTEIN: CRP: 3.9 mg/dL — ABNORMAL HIGH (ref <1.0)

## 2021-05-01 LAB — COMPREHENSIVE METABOLIC PANEL
ALT: 55 U/L — ABNORMAL HIGH (ref 0–44)
AST: 116 U/L — ABNORMAL HIGH (ref 15–41)
Albumin: 2.7 g/dL — ABNORMAL LOW (ref 3.5–5.0)
Alkaline Phosphatase: 85 U/L (ref 38–126)
Anion gap: 8 (ref 5–15)
BUN: 36 mg/dL — ABNORMAL HIGH (ref 8–23)
CO2: 22 mmol/L (ref 22–32)
Calcium: 8.7 mg/dL — ABNORMAL LOW (ref 8.9–10.3)
Chloride: 104 mmol/L (ref 98–111)
Creatinine, Ser: 1.17 mg/dL (ref 0.61–1.24)
GFR, Estimated: >60 mL/min (ref >60)
Glucose, Bld: 105 mg/dL — ABNORMAL HIGH (ref 70–99)
Potassium: 3.8 mmol/L (ref 3.5–5.1)
Sodium: 134 mmol/L — ABNORMAL LOW (ref 135–145)
Total Bilirubin: 0.5 mg/dL (ref 0.3–1.2)
Total Protein: 5.5 g/dL — ABNORMAL LOW (ref 6.5–8.1)

## 2021-05-01 LAB — CBC
HCT: 38.6 % — ABNORMAL LOW (ref 39.0–52.0)
Hemoglobin: 12.9 g/dL — ABNORMAL LOW (ref 13.0–17.0)
MCH: 26.3 pg (ref 26.0–34.0)
MCHC: 33.4 g/dL (ref 30.0–36.0)
MCV: 78.6 fL — ABNORMAL LOW (ref 80.0–100.0)
Platelets: 174 10*3/uL (ref 150–400)
RBC: 4.91 MIL/uL (ref 4.22–5.81)
RDW: 16.9 % — ABNORMAL HIGH (ref 11.5–15.5)
WBC: 18.7 10*3/uL — ABNORMAL HIGH (ref 4.0–10.5)
nRBC: 0 % (ref 0.0–0.2)

## 2021-05-01 LAB — CULTURE, BLOOD (ROUTINE X 2)
Culture: NO GROWTH
Culture: NO GROWTH
Special Requests: ADEQUATE
Special Requests: ADEQUATE

## 2021-05-01 LAB — GLUCOSE, CAPILLARY
Glucose-Capillary: 55 mg/dL — ABNORMAL LOW (ref 70–99)
Glucose-Capillary: 66 mg/dL — ABNORMAL LOW (ref 70–99)
Glucose-Capillary: 166 mg/dL — ABNORMAL HIGH (ref 70–99)
Glucose-Capillary: 244 mg/dL — ABNORMAL HIGH (ref 70–99)
Glucose-Capillary: 298 mg/dL — ABNORMAL HIGH (ref 70–99)

## 2021-05-01 LAB — ECHOCARDIOGRAM COMPLETE
Calc EF: 47.8 %
Height: 72 in
S' Lateral: 3.1 cm
Single Plane A2C EF: 47.7 %
Single Plane A4C EF: 47.1 %
Weight: 2606.72 oz

## 2021-05-01 LAB — CULTURE, RESPIRATORY W GRAM STAIN

## 2021-05-01 LAB — PROCALCITONIN: Procalcitonin: 0.28 ng/mL

## 2021-05-01 LAB — BRAIN NATRIURETIC PEPTIDE: B Natriuretic Peptide: 210.4 pg/mL — ABNORMAL HIGH (ref 0.0–100.0)

## 2021-05-01 MED ORDER — INSULIN DETEMIR 100 UNIT/ML ~~LOC~~ SOLN
24.0000 [IU] | Freq: Every day | SUBCUTANEOUS | Status: DC
  Administered 2021-05-01 – 2021-05-02 (×2): 24 [IU] via SUBCUTANEOUS
  Filled 2021-05-01 (×3): qty 0.24

## 2021-05-01 MED ORDER — HALOPERIDOL LACTATE 5 MG/ML IJ SOLN
2.0000 mg | Freq: Once | INTRAMUSCULAR | Status: AC | PRN
  Administered 2021-05-01: 2 mg via INTRAVENOUS
  Filled 2021-05-01: qty 1

## 2021-05-01 MED ORDER — INSULIN DETEMIR 100 UNIT/ML ~~LOC~~ SOLN
38.0000 [IU] | Freq: Every morning | SUBCUTANEOUS | Status: DC
  Administered 2021-05-02 – 2021-05-05 (×4): 38 [IU] via SUBCUTANEOUS
  Filled 2021-05-01 (×5): qty 0.38

## 2021-05-01 MED ORDER — INSULIN DETEMIR 100 UNIT/ML ~~LOC~~ SOLN
30.0000 [IU] | Freq: Every day | SUBCUTANEOUS | Status: DC
  Filled 2021-05-01: qty 0.3

## 2021-05-01 MED ORDER — INSULIN ASPART 100 UNIT/ML IJ SOLN
6.0000 [IU] | Freq: Three times a day (TID) | INTRAMUSCULAR | Status: DC
  Administered 2021-05-01 – 2021-05-05 (×12): 6 [IU] via SUBCUTANEOUS

## 2021-05-01 MED ORDER — FUROSEMIDE 10 MG/ML IJ SOLN
40.0000 mg | Freq: Once | INTRAMUSCULAR | Status: AC
  Administered 2021-05-01: 40 mg via INTRAVENOUS
  Filled 2021-05-01: qty 4

## 2021-05-01 MED ORDER — CEFTRIAXONE SODIUM 2 G IJ SOLR
2.0000 g | INTRAMUSCULAR | Status: AC
  Administered 2021-05-02: 2 g via INTRAVENOUS
  Filled 2021-05-01: qty 20

## 2021-05-01 MED ORDER — PERFLUTREN LIPID MICROSPHERE
1.0000 mL | INTRAVENOUS | Status: AC | PRN
Start: 2021-05-01 — End: 2021-05-01
  Administered 2021-05-01: 5 mL via INTRAVENOUS
  Filled 2021-05-01: qty 10

## 2021-05-01 MED ORDER — QUETIAPINE FUMARATE 25 MG PO TABS
25.0000 mg | ORAL_TABLET | Freq: Every day | ORAL | Status: DC
  Administered 2021-05-01 – 2021-05-04 (×4): 25 mg via ORAL
  Filled 2021-05-01 (×4): qty 1

## 2021-05-01 MED ORDER — INSULIN DETEMIR 100 UNIT/ML ~~LOC~~ SOLN: 38.0000 [IU] | Freq: Two times a day (BID) | SUBCUTANEOUS | Status: DC

## 2021-05-01 NOTE — TOC Progression Note (Addendum)
Transition of Care [facility]) - Progression Note    Patient Details  Name: Timothy Mclean MRN: [id number] Date of Birth: 08-28-1940  Transition of Care [facility]) CM/SW [place], LCSW Phone Number: 05/01/2021, 8:59 AM  Clinical Narrative:    Recommendation changing to SNF and family in agreement. Patient will be eligible for SNF on day 11 past COVID + date (10/18). CSW will fax out referral once oxygen levels are down.   UPDATE: Per MD, patient's COVID was weeks ago and no longer needs contact precautions. CSW will send out referral once O2 levels are less.   Expected Discharge Plan: [place] Barriers to Discharge: Continued Medical Work up  Expected Discharge Plan and Services Expected Discharge Plan: [place]   Discharge Planning Services: CM Consult Post Acute Care Choice: Home Health, Durable Medical Equipment Living arrangements for the past 2 months: Single Family Home                 DME Arranged: Walker rolling DME Agency: [employer] Date DME Agency Contacted: 04/29/21 Time DME Agency Contacted: [phone] Representative spoke with at DME Agency: Freda Munro HH Arranged: PT, OT [facility] Agency: [place] Date [place]: 04/30/21 Time [place]: 1332 Representative spoke with at [place]: [place] Determinants of Health ([place]) Interventions    Readmission Risk Interventions No flowsheet data found.

## 2021-05-01 NOTE — Progress Notes (Signed)
Dr Sloan Leiter would like to continue ceftriaxone for a total of 7d.  Onnie Boer, PharmD, BCIDP, AAHIVP, CPP Infectious Disease Pharmacist 05/01/2021 10:50 AM

## 2021-05-01 NOTE — Progress Notes (Signed)
PROGRESS NOTE        PATIENT DETAILS Name: Timothy Mclean Age: 80 y.o. Sex: male Date of Birth: December 19, 1940 Admit Date: 04/26/2021 Admitting Physician Lequita Halt, MD [id number], Collier [place], MD  Brief Narrative: Patient is a 80 y.o. male with history of COVID-19 infection on 9/15 (completed paxlovid/prednisone as an outpatient), dementia, atrial fibrillation on Eliquis, CAD s/p CABG, DM-2 who presented with shortness of breath-he was found to have acute hypoxic respiratory failure-due to community-acquired pneumonia-and subsequently admitted to the hospitalist service.  Subjective: Lying comfortably in bed-mildly confused-spouse at bedside-patient following simple commands.  Had episode of agitation last night and received Haldol.  Objective: Vitals: Blood pressure (!) 109/97, pulse 100, temperature 97.7 F (36.5 C), temperature source Oral, resp. rate 17, height 6' (1.829 m), weight 73.9 kg, SpO2 90 %.   Exam: Gen Exam: Pleasantly confused-following simple commands. HEENT:atraumatic, normocephalic Chest: B/L clear to auscultation anteriorly CVS:S1S2 regular Abdomen:soft non tender, non distended Extremities:no edema Neurology: Non focal Skin: no rash   Pertinent Labs/Radiology: NA: 134 K: 3.8 Creatinine: 1.17 WBC: 18.7 Hb: 12.9 PLT: 174  10/8>>Blood culture: No growth 10/10>> sputum culture: Few yeast-pending final result  Assessment/Plan: Acute hypoxic respiratory failure due to community-acquired pneumonia: Probable post viral pneumonia-given recent history of COVID-19 infection.  Continues to have slow improvement in hypoxemia-down to 3-4 L today.  Given severity of pneumonia-we will extend IV antimicrobial therapy for 7 days total.  Has some mild lower extremity edema-we will give 1 dose of IV Lasix today to ensure a negative balance.  Recent COVID-19 infection: Not felt to have active COVID-19 infection at this point.  Completed outpatient  treatment as noted above.  CKD stage IIIb: Creatinine close to baseline  History of ulcerative colitis: Continue balsalazide-appears to be controlled.  GERD/Barrett's esophagus: Continue PPI-continue outpatient follow-up with GI.  Chronic atrial fibrillation: Rate controlled with diltiazem-anticoagulated with Eliquis.  HLD: Continue statin  Hypothyroidism: Continue Synthroid  DM-2 (A1c 7.7 on 10/8): CBGs continue to fluctuate-has hypoglycemia this morning-decrease p.m. dosage of Levemir to 24 units-increase a.m. dosage of Levemir to 38 units.  Have increased Premeal NovoLog to 6 units-continue SSI and reassess on 10/14.    Recent Labs    05/01/21 0819 05/01/21 0952 05/01/21 1316  GLUCAP 55* 66* 166*     Dementia with delirium: Had episode of severe agitation last night-subsequently received Haldol-Per spouse-patient finally slept after receiving Haldol.  Since he continues to have delirium-we will start low-dose Seroquel nightly.  Reassess on 10/14.  Nutrition Status: Nutrition Problem: Moderate Malnutrition Etiology: chronic illness Signs/Symptoms: moderate fat depletion, severe muscle depletion Interventions: Ensure Enlive (each supplement provides 350kcal and 20 grams of protein), MVI, Magic cup   Procedures: None Consults: None DVT Prophylaxis: Eliquis Code Status:Full code Family Communication: Spouse at bedside  Time spent: 29 minutes-Greater than 50% of this time was spent in counseling, explanation of diagnosis, planning of further management, and coordination of care.  Diet: Diet Order             DIET DYS 3 Room service appropriate? Yes with Assist; Fluid consistency: Thin  Diet effective now                      Disposition Plan: Status is: Inpatient  Remains inpatient appropriate because:Inpatient level of care appropriate due to severity of illness  Dispo: The patient is from: Home              Anticipated d/c is to: SNF.               Patient currently is not medically stable to d/c.   Difficult to place patient No   Barriers to Discharge: Hypoxia with O2 requirement-still on IV antibiotics-not yet stable for discharge.  Antimicrobial agents: Anti-infectives (From admission, onward)    Start     Dose/Rate Route Frequency Ordered Stop   05/02/21 1000  cefTRIAXone (ROCEPHIN) 2 g in sodium chloride 0.9 % 100 mL IVPB        2 g 200 mL/hr over 30 Minutes Intravenous Every 24 hours 05/01/21 1050 05/03/21 0959   04/27/21 1000  remdesivir 100 mg in sodium chloride 0.9 % 100 mL IVPB  Status:  Discontinued       See Hyperspace for full Linked Orders Report.   100 mg 200 mL/hr over 30 Minutes Intravenous Daily 04/26/21 1348 04/26/21 1504   04/27/21 1000  cefTRIAXone (ROCEPHIN) 2 g in sodium chloride 0.9 % 100 mL IVPB        2 g 200 mL/hr over 30 Minutes Intravenous Every 24 hours 04/26/21 1500 05/01/21 0937   04/27/21 1000  azithromycin (ZITHROMAX) tablet 500 mg        500 mg Oral Daily 04/26/21 1500 05/01/21 0851   04/26/21 1500  remdesivir 200 mg in sodium chloride 0.9% 250 mL IVPB       See Hyperspace for full Linked Orders Report.   200 mg 580 mL/hr over 30 Minutes Intravenous Once 04/26/21 1348 04/26/21 1722   04/26/21 1400  cefTRIAXone (ROCEPHIN) 2 g in sodium chloride 0.9 % 100 mL IVPB  Status:  Discontinued        2 g 200 mL/hr over 30 Minutes Intravenous Every 24 hours 04/26/21 1348 04/27/21 0841   04/26/21 1400  azithromycin (ZITHROMAX) 500 mg in sodium chloride 0.9 % 250 mL IVPB  Status:  Discontinued        500 mg 250 mL/hr over 60 Minutes Intravenous Every 24 hours 04/26/21 1348 04/27/21 0840        MEDICATIONS: Scheduled Meds:  apixaban  2.5 mg Oral BID   balsalazide  2,250 mg Oral BID   benzonatate  100 mg Oral TID   diltiazem  60 mg Oral Q8H   feeding supplement (GLUCERNA SHAKE)  237 mL Oral BID BM   finasteride  5 mg Oral Daily   fluticasone furoate-vilanterol  1 puff Inhalation Daily    guaiFENesin  1,200 mg Oral BID   insulin aspart  0-5 Units Subcutaneous QHS   insulin aspart  0-9 Units Subcutaneous TID WC   insulin aspart  6 Units Subcutaneous TID WC   insulin detemir  30 Units Subcutaneous QHS   insulin detemir  38 Units Subcutaneous q morning   levothyroxine  50 mcg Oral Daily   linagliptin  5 mg Oral Daily   melatonin  5 mg Oral QHS   multivitamin with minerals  1 tablet Oral Daily   oxybutynin  5 mg Oral Daily   pantoprazole  40 mg Oral BID   Ensure Max Protein  11 oz Oral QHS   QUEtiapine  25 mg Oral QHS   rosuvastatin  20 mg Oral Daily   cyanocobalamin  2,000 mcg Oral Daily   Continuous Infusions:  [START ON 05/02/2021] cefTRIAXone (ROCEPHIN)  IV     PRN Meds:.acetaminophen **OR** acetaminophen,  albuterol, guaiFENesin-dextromethorphan, metoprolol tartrate, ondansetron **OR** ondansetron (ZOFRAN) IV, perflutren lipid microspheres (DEFINITY) IV suspension, senna-docusate, traZODone   I have personally reviewed following labs and imaging studies  LABORATORY DATA: CBC: Recent Labs  Lab 04/26/21 0351 04/27/21 0239 04/27/21 0330 04/28/21 0953 05/01/21 0141  WBC 16.4*  --  17.2* 21.4* 18.7*  NEUTROABS 14.4*  --   --  20.3*  --   HGB 13.9 13.6 13.5 12.8* 12.9*  HCT 42.1 40.0 40.6 38.6* 38.6*  MCV 80.3  --  80.7 80.2 78.6*  PLT 200  --  157 176 174     Basic Metabolic Panel: Recent Labs  Lab 04/26/21 0351 04/27/21 0239 04/27/21 0330 04/28/21 0953 04/29/21 0122 05/01/21 0141  NA 130* 132* 130* 133* 133* 134*  K 4.4 4.0 4.1 3.3* 4.1 3.8  CL 96*  --  98 102 102 104  CO2 22  --  18* 19* 20* 22  GLUCOSE 193*  --  197* 243* 221* 105*  BUN 28*  --  30* 40* 46* 36*  CREATININE 1.56*  --  1.61* 1.43* 1.44* 1.17  CALCIUM 9.4  --  8.6* 8.4* 8.6* 8.7*     GFR: Estimated Creatinine Clearance: 50.9 mL/min (by C-G formula based on SCr of 1.17 mg/dL).  Liver Function Tests: Recent Labs  Lab 04/26/21 0351 05/01/21 0141  AST 26 116*  ALT 26 55*   ALKPHOS 59 85  BILITOT 1.3* 0.5  PROT 6.5 5.5*  ALBUMIN 3.6 2.7*    No results for input(s): LIPASE, AMYLASE in the last 168 hours. No results for input(s): AMMONIA in the last 168 hours.  Coagulation Profile: No results for input(s): INR, PROTIME in the last 168 hours.  Cardiac Enzymes: No results for input(s): CKTOTAL, CKMB, CKMBINDEX, TROPONINI in the last 168 hours.  BNP (last 3 results) No results for input(s): PROBNP in the last 8760 hours.  Lipid Profile: No results for input(s): CHOL, HDL, LDLCALC, TRIG, CHOLHDL, LDLDIRECT in the last 72 hours.  Thyroid Function Tests: No results for input(s): TSH, T4TOTAL, FREET4, T3FREE, THYROIDAB in the last 72 hours.  Anemia Panel: No results for input(s): VITAMINB12, FOLATE, FERRITIN, TIBC, IRON, RETICCTPCT in the last 72 hours.  Urine analysis: No results found for: COLORURINE, APPEARANCEUR, LABSPEC, PHURINE, GLUCOSEU, HGBUR, BILIRUBINUR, KETONESUR, PROTEINUR, UROBILINOGEN, NITRITE, LEUKOCYTESUR  Sepsis Labs: Lactic Acid, Venous No results found for: LATICACIDVEN  MICROBIOLOGY: Recent Results (from the past 240 hour(s))  Resp Panel by RT-PCR (Flu A&B, Covid) Nasopharyngeal Swab     Status: Abnormal   Collection Time: 04/26/21  3:43 AM   Specimen: Nasopharyngeal Swab; Nasopharyngeal(NP) swabs in vial transport medium  Result Value Ref Range Status   SARS Coronavirus 2 by RT PCR POSITIVE (A) NEGATIVE Final    Comment: RESULT CALLED TO, READ BACK BY AND VERIFIED WITH: RN BRITTANY O. 04/26/21@4 :50 BY TW (NOTE) SARS-CoV-2 target nucleic acids are DETECTED.  The SARS-CoV-2 RNA is generally detectable in upper respiratory specimens during the acute phase of infection. Positive results are indicative of the presence of the identified virus, but do not rule out bacterial infection or co-infection with other pathogens not detected by the test. Clinical correlation with patient history and other diagnostic information is  necessary to determine patient infection status. The expected result is Negative.  Fact Sheet for Patients: EntrepreneurPulse.com.au  Fact Sheet for Healthcare Providers: IncredibleEmployment.be  This test is not yet approved or cleared by the [place] FDA and  has been authorized for detection and/or diagnosis of  SARS-CoV-2 by FDA under an Emergency Use Authorization (EUA).  This EUA will remain in effect (meaning this test can b e used) for the duration of  the COVID-19 declaration under Section 564(b)(1) of the Act, 21 U.S.C. section 360bbb-3(b)(1), unless the authorization is terminated or revoked sooner.     Influenza A by PCR NEGATIVE NEGATIVE Final   Influenza B by PCR NEGATIVE NEGATIVE Final    Comment: (NOTE) The Xpert Xpress SARS-CoV-2/FLU/RSV plus assay is intended as an aid in the diagnosis of influenza from Nasopharyngeal swab specimens and should not be used as a sole basis for treatment. Nasal washings and aspirates are unacceptable for Xpert Xpress SARS-CoV-2/FLU/RSV testing.  Fact Sheet for Patients: EntrepreneurPulse.com.au  Fact Sheet for Healthcare Providers: IncredibleEmployment.be  This test is not yet approved or cleared by the [place] FDA and has been authorized for detection and/or diagnosis of SARS-CoV-2 by FDA under an Emergency Use Authorization (EUA). This EUA will remain in effect (meaning this test can be used) for the duration of the COVID-19 declaration under Section 564(b)(1) of the Act, 21 U.S.C. section 360bbb-3(b)(1), unless the authorization is terminated or revoked.  Performed at [facility], [place] [address] [place], [place] [address]   Culture, blood (routine x 2) Call MD if unable to obtain prior to antibiotics being given     Status: None   Collection Time: 04/26/21  3:15 PM   Specimen: BLOOD  Result Value Ref Range Status   Specimen  Description BLOOD RIGHT ANTECUBITAL  Final   Special Requests   Final    BOTTLES DRAWN AEROBIC AND ANAEROBIC Blood Culture adequate volume   Culture   Final    NO GROWTH 5 DAYS Performed at [facility], [place] [address] [place], [place] [address]    Report Status 05/01/2021 FINAL  Final  Culture, blood (routine x 2) Call MD if unable to obtain prior to antibiotics being given     Status: None   Collection Time: 04/26/21  3:57 PM   Specimen: BLOOD LEFT HAND  Result Value Ref Range Status   Specimen Description BLOOD LEFT HAND  Final   Special Requests   Final    BOTTLES DRAWN AEROBIC AND ANAEROBIC Blood Culture adequate volume   Culture   Final    NO GROWTH 5 DAYS Performed at [facility], [place] [address], [place], [place] [address]    Report Status 05/01/2021 FINAL  Final  Expectorated Sputum Assessment w Gram Stain, Rflx to Resp Cult     Status: None   Collection Time: 04/28/21  8:24 PM   Specimen: SPU  Result Value Ref Range Status   Specimen Description SPUTUM  Final   Special Requests NONE  Final   Sputum evaluation   Final    THIS SPECIMEN IS ACCEPTABLE FOR SPUTUM CULTURE Performed at [facility], [place] [address], [place], [place] [address]    Report Status 04/30/2021 FINAL  Final  Culture, Respiratory w Gram Stain     Status: None   Collection Time: 04/28/21  8:24 PM   Specimen: SPU  Result Value Ref Range Status   Specimen Description SPUTUM  Final   Special Requests NONE Reflexed from [id number]  Final   Gram Stain   Final    FEW SQUAMOUS EPITHELIAL CELLS PRESENT MODERATE WBC SEEN NO ORGANISMS SEEN Performed at [facility], [place] [address], [place], [place] [address]    Culture FEW CANDIDA ALBICANS  Final   Report Status 05/01/2021 FINAL  Final    RADIOLOGY STUDIES/RESULTS: DG Chest Port 1 View  Result Date: 05/01/2021 CLINICAL DATA:  Short of breath.  Recent COVID.  Cough and weakness EXAM: PORTABLE CHEST 1 VIEW COMPARISON:  04/26/2021  FINDINGS: Postop CABG. Cardiac enlargement with vascular congestion. No edema or effusion Bibasilar airspace disease shows mild interval improvement. IMPRESSION: Improvement in bibasilar atelectasis/infiltrate Pulmonary vascular congestion. Electronically Signed   By: Franchot Gallo M.D.   On: 05/01/2021 08:23     LOS: 5 days   Oren Binet, MD  [facility]    To contact the attending provider between 7A-7P or the covering provider during after hours 7P-7A, please log into the web site [email or web address] and access using universal [place] password for that web site. If you do not have the password, please call the hospital operator.  05/01/2021, 1:53 PM

## 2021-05-01 NOTE — Progress Notes (Signed)
Inpatient Diabetes Program Recommendations  AACE/ADA: New Consensus Statement on Inpatient Glycemic Control (2015)  Target Ranges:  Prepandial:   less than 140 mg/dL      Peak postprandial:   less than 180 mg/dL (1-2 hours)      Critically ill patients:  140 - 180 mg/dL   Lab Results  Component Value Date   GLUCAP 66 (L) 05/01/2021   HGBA1C 7.7 (H) 04/26/2021    Review of Glycemic Control Results for Timothy Mclean, Timothy Mclean (MRN [id number]) as of 05/01/2021 10:57  Ref. Range 04/30/2021 16:45 04/30/2021 19:46 05/01/2021 08:19 05/01/2021 09:52  Glucose-Capillary Latest Ref Range: 70 - 99 mg/dL 302 (H) 355 (H) 55 (L) 66 (L)   Diabetes history: Type 2 DM Outpatient Diabetes medications: Glipizide 5 mg QD Current orders for Inpatient glycemic control: Levemir 30 units QHS, 38 units QD, Novolog 6 units TID, Novolog 0-9 units TID, Novolog 0-5 units QHS, Tradjenta 5 mg QD  Inpatient Diabetes Program Recommendations:    Noted insulin adjustments this AM, steroids completed and hypoglycemia of 55 mg/dL.   Consider: -Decreasing Levemir to 15 units BID -Novolog 3 units TID -Novolog 0-6 units TID   Thanks, Bronson Curb, MSN, RNC-OB Diabetes Coordinator [phone] (8a-5p)

## 2021-05-01 NOTE — Progress Notes (Signed)
Patient very agitated and becoming physical with staff. Patient wants to get out of bed and not following instructions. MD notified and nurse requested something to help with agitation.

## 2021-05-01 NOTE — Progress Notes (Signed)
Modified Barium Swallow Progress Note  Patient Details  Name: Timothy Mclean MRN: [id number] Date of Birth: 1940/10/14  Today's Date: 05/01/2021  Modified Barium Swallow completed.  Full report located under Chart Review in the Imaging Section.  Brief recommendations include the following:  Clinical Impression  Pt presents with pharyngeal dysphagia characterized by reduced pharyngeal constriction, and reduced anterior laryngeal movement which resulted in reduced epiglottic inversion and reduced airway protection. He demonstrated posterior pharyngeal wall residue, pyriform sinus residue, and penetration (PAS 5) as well as aspiration (PAS 7,8) with thin and nectar thick liquids. Delayed coughing was inconsistently noted following instances of aspiration, but this was ineffective. Laryngeal invasion was noted with honey thick liquids via straw, but not with cup. Immediate laryngeal invasion was improved to penetration (PAS 3) with smaller boluses of nectar thick liquids via cup and prompted coughing was able to expel the penetrate. However, pt demonstrated difficulty consistently demonstrating this. A chin tuck posture increased aspiration of pyriform sinus residue. A dysphagia 3 diet with honey thick liquids is recommended at this time. SLP will follow for dysphagia treatment.   Swallow Evaluation Recommendations       SLP Diet Recommendations: Dysphagia 3 (Mech soft) solids;Honey thick liquids   Liquid Administration via: Cup;No straw   Medication Administration: Whole meds with puree or crushed as tolerated   Supervision: Patient able to self feed;Staff to assist with self feeding   Compensations: Slow rate;Small sips/bites;Follow solids with liquid   Postural Changes: Seated upright at 90 degrees   Oral Care Recommendations: Oral care BID     Avynn Klassen I. Hardin Negus, [place], [place] Office number [phone] Pager [place] 05/01/2021,4:14 PM

## 2021-05-01 NOTE — Progress Notes (Signed)
Speech Language Pathology Treatment: Dysphagia  Patient Details Name: Timothy Mclean MRN: [id number] DOB: [birth year] Today's Date: 05/01/2021 Time: 4081-4481 SLP Time Calculation (min) (ACUTE ONLY): 17 min  Assessment / Plan / Recommendation Clinical Impression  Pt was seen for dysphagia treatment with his son present. Pt and his son were educated regarding the results of the modified barium swallow study, diet recommendations, and swallowing precautions. Video recording of the study was used to facilitate education and both parties verbalized understanding regarding all areas of education. However, pt was lethargic, and it is anticipated reinforcement will be needed. Pt's son reported that the pt demonstrates episodes of significant coughing at home, but that he could not recall whether they were associated with p.o. intake. Considering the nature and severity of pt's impairments, SLP suspects acute on chronic dysphagia. Pt's son was educated on the need for thickener, and methods for thickening liquids. This process was demonstrated, and all of the son's questions were answered to his satisfaction. SLP will continue to follow pt.     HPI HPI: Pt is an 80 y.o. male who presented with productive cough and SOB. Pt's wife reported on admission that pt has mild dementia with memory problems but never had aspiration before and no choking or cough after eating regular food. CXR 10/8: New patchy and nodular bibasilar pulmonary opacity since August,  compatible with bilateral pneumonia. Aspiration also a  consideration.  PMH: HTN, chronic A. fib, CAD s/p CABG 1997, IIDM, asthma/COPD, GERD/Barrett esophagus, hypothyroidism, CKD stage II, positive COVID-19 test on 04/03/21.      SLP Plan  Continue with current plan of care      Recommendations for follow up therapy are one component of a multi-disciplinary discharge planning process, led by the attending physician.  Recommendations may be updated based on  patient status, additional functional criteria and insurance authorization.    Recommendations  Diet recommendations: Dysphagia 3 (mechanical soft);Honey-thick liquid Liquids provided via: Cup;No straw Medication Administration: Whole meds with puree Supervision: Patient able to self feed Compensations: Slow rate;Small sips/bites Postural Changes and/or Swallow Maneuvers: Seated upright 90 degrees;Upright 30-60 min after meal                Oral Care Recommendations: Oral care BID Follow up Recommendations: None SLP Visit Diagnosis: Dysphagia, unspecified (R13.10) Plan: Continue with current plan of care       Carle Fenech I. Hardin Negus, [place], [place] Office number [phone] Pager [phone]                 Horton Marshall  05/01/2021, 5:44 PM

## 2021-05-01 NOTE — Progress Notes (Signed)
Physical Therapy Treatment Patient Details Name: Timothy Mclean MRN: [id number] DOB: 07/05/1941 Today's Date: 05/01/2021   History of Present Illness Timothy Mclean is a 80 y.o. male came with worsening of breathing symptoms including productive cough and SOB, COVID+ on 9/15 & treated at home by PCP. PMHx: HTN, chronic A. fib, CAD s/p CABG 1997, IIDM, asthma/COPD, GERD/Barrett esophagus, hypothyroidism, CKD stage II    PT Comments    Patient continues with increasing confusion. Was able to attend to task to safely transfer OOB to chair with 1 person min assist, however not safe to ambulate with multiple lines and his confusional state at this time. Agree with OT recommendation for SNF as pt has not made anticipated progress since evaluation. Wife agrees with plan for SNF prior to discharge home.     Recommendations for follow up therapy are one component of a multi-disciplinary discharge planning process, led by the attending physician.  Recommendations may be updated based on patient status, additional functional criteria and insurance authorization.  Follow Up Recommendations  Supervision/Assistance - 24 hour;SNF (has not progressed to level wife can manage at home)     Equipment Recommendations  None recommended by PT    Recommendations for Other Services       Precautions / Restrictions Precautions Precautions: Fall Restrictions Weight Bearing Restrictions: No     Mobility  Bed Mobility Overal bed mobility: Needs Assistance Bed Mobility: Supine to Sit     Supine to sit: Mod assist     General bed mobility comments: assist to raise torso; pt then able to scoot hips out to EOB prior to stand    Transfers Overall transfer level: Needs assistance Equipment used: 1 person hand held assist Transfers: Sit to/from [facility] Sit to Stand: Min assist Stand pivot transfers: Min assist       General transfer comment: stood holding PT's hand and bedrail on  other side; appropriately reaching across for far armrest of recliner as pivoting to sit  Ambulation/Gait             General Gait Details: deferred due to lethargy/cognition   Stairs             Wheelchair Mobility    Modified Rankin (Stroke Patients Only)       Balance Overall balance assessment: Needs assistance Sitting-balance support: Feet supported Sitting balance-Leahy Scale: Fair     Standing balance support: During functional activity;Bilateral upper extremity supported Standing balance-Leahy Scale: Poor Standing balance comment: required assist from therapist                            Cognition Arousal/Alertness: Lethargic;Suspect due to medications (was agitated overnight and given meds) Behavior During Therapy: Flat affect Overall Cognitive Status: Impaired/Different from baseline Area of Impairment: Memory;Following commands;Safety/judgement;Awareness;Problem solving                 Orientation Level:  (NT) Current Attention Level: Sustained Memory: Decreased short-term memory Following Commands: Follows one step commands with increased time Safety/Judgement: Decreased awareness of safety;Decreased awareness of deficits Awareness: Intellectual Problem Solving: Slow processing;Decreased initiation;Requires verbal cues;Requires tactile cues;Difficulty sequencing General Comments: per wife, pt has not slept well for 3 nights      Exercises      General Comments General comments (skin integrity, edema, etc.): wife present and states he needs to be better before she could manage him at home; agreeable to SNF for therapies  Pertinent Vitals/Pain Pain Assessment: Faces Faces Pain Scale: No hurt    Home Living                      Prior Function            PT Goals (current goals can now be found in the care plan section) Acute Rehab PT Goals Patient Stated Goal: per wife, rehab then home PT Goal  Formulation: With family Time For Goal Achievement: 05/12/21 Potential to Achieve Goals: Good Progress towards PT goals: Not progressing toward goals - comment (confusion/lethargy)    Frequency    Min 2X/week      PT Plan Discharge plan needs to be updated;Frequency needs to be updated    Co-evaluation              AM-[name] PT "6 Clicks" Mobility   Outcome Measure  Help needed turning from your back to your side while in a flat bed without using bedrails?: A Little Help needed moving from lying on your back to sitting on the side of a flat bed without using bedrails?: A Little Help needed moving to and from a bed to a chair (including a wheelchair)?: A Little Help needed standing up from a chair using your arms (e.g., wheelchair or bedside chair)?: A Little Help needed to walk in hospital room?: Total Help needed climbing 3-5 steps with a railing? : Total 6 Click Score: 14    End of Session Equipment Utilized During Treatment: Oxygen Activity Tolerance: Treatment limited secondary to medical complications (Comment) (limited by confusion/lethargy) Patient left: in chair;with call bell/phone within reach;with chair alarm set;with family/visitor present;Other (comment) (telesitter)   PT Visit Diagnosis: Unsteadiness on feet (R26.81);Muscle weakness (generalized) (M62.81);Difficulty in walking, not elsewhere classified (R26.2)     Time: 0962-8366 PT Time Calculation (min) (ACUTE ONLY): 21 min  Charges:  $Therapeutic Activity: 8-22 mins                      Timothy Mclean, PT Pager [phone]    Timothy Mclean 05/01/2021, 11:13 AM

## 2021-05-02 LAB — C-REACTIVE PROTEIN: CRP: 2.8 mg/dL — ABNORMAL HIGH (ref <1.0)

## 2021-05-02 LAB — GLUCOSE, CAPILLARY
Glucose-Capillary: 144 mg/dL — ABNORMAL HIGH (ref 70–99)
Glucose-Capillary: 225 mg/dL — ABNORMAL HIGH (ref 70–99)
Glucose-Capillary: 162 mg/dL — ABNORMAL HIGH (ref 70–99)
Glucose-Capillary: 175 mg/dL — ABNORMAL HIGH (ref 70–99)

## 2021-05-02 NOTE — Progress Notes (Signed)
PROGRESS NOTE        PATIENT DETAILS Name: Timothy Mclean Age: 80 y.o. Sex: male Date of Birth: 02/12/1941 Admit Date: 04/26/2021 Admitting Physician Lequita Halt, MD JZP:[id number], Collier Salina, MD  Brief Narrative: Patient is a 80 y.o. male with history of COVID-19 infection on 9/15 (completed paxlovid/prednisone as an outpatient), dementia, atrial fibrillation on Eliquis, CAD s/p CABG, DM-2 who presented with shortness of breath-he was found to have acute hypoxic respiratory failure-due to community-acquired pneumonia-and subsequently admitted to the hospitalist service.  Subjective: Lying comfortably in bed-Down to 2 L of oxygen.  Slept comfortably last night per spouse.  Objective: Vitals: Blood pressure 100/65, pulse 89, temperature 97.9 F (36.6 C), temperature source Oral, resp. rate 18, height 6' (1.829 m), weight 73.9 kg, SpO2 91 %.   Exam: Gen Exam: Comfortable-not in any distress.  Mildly confused. HEENT:atraumatic, normocephalic Chest: B/L clear to auscultation anteriorly CVS:S1S2 regular Abdomen:soft non tender, non distended Extremities:no edema Neurology: Non focal Skin: no rash   Pertinent Labs/Radiology: 10/8>>Blood culture: No growth 10/10>> sputum culture: Few yeast-pending final result  Assessment/Plan: Acute hypoxic respiratory failure due to community-acquired pneumonia: Probable post viral pneumonia-given recent history of COVID-19 infection.  Significantly improved over the past few days-Down to 2 L of oxygen.  Continue Rocephin x7 days.  Recent COVID-19 infection: Not felt to have active COVID-19 infection at this point.  Completed outpatient treatment as noted above.  CKD stage IIIb: Creatinine close to baseline  History of ulcerative colitis: Continue balsalazide-appears to be controlled.  GERD/Barrett's esophagus: Continue PPI-continue outpatient follow-up with GI.  Chronic atrial fibrillation: Rate controlled with  diltiazem-anticoagulated with Eliquis.  HLD: Continue statin  Hypothyroidism: Continue Synthroid  DM-2 (A1c 7.7 on 10/8): CBGs relatively stable-continue a.m. Levemir 38 units, p.m. Levemir 25 units, Premeal NovoLog 6 units with meals and SSI.   Recent Labs    05/01/21 2013 05/02/21 0800 05/02/21 1146  GLUCAP 298* 144* 225*     Dementia with delirium: Delirium/sleep better after starting low-dose Seroquel.  Continue supportive care.   Nutrition Status: Nutrition Problem: Moderate Malnutrition Etiology: chronic illness Signs/Symptoms: moderate fat depletion, severe muscle depletion Interventions: Ensure Enlive (each supplement provides 350kcal and 20 grams of protein), MVI, Magic cup   Procedures: None Consults: None DVT Prophylaxis: Eliquis Code Status:Full code Family Communication: Spouse at bedside  Time spent: 58 minutes-Greater than 50% of this time was spent in counseling, explanation of diagnosis, planning of further management, and coordination of care.  Diet: Diet Order             DIET DYS 3 Room service appropriate? Yes with Assist; Fluid consistency: Honey Thick  Diet effective now                      Disposition Plan: Status is: Inpatient  Remains inpatient appropriate because:Inpatient level of care appropriate due to severity of illness  Dispo: The patient is from: Home              Anticipated d/c is to: SNF.              Patient currently is not medically stable to d/c.   Difficult to place patient No   Barriers to Discharge: Hypoxia with O2 requirement-still on IV antibiotics-not yet stable for discharge.  Antimicrobial agents: Anti-infectives (From admission, onward)  Start     Dose/Rate Route Frequency Ordered Stop   05/02/21 1000  cefTRIAXone (ROCEPHIN) 2 g in sodium chloride 0.9 % 100 mL IVPB        2 g 200 mL/hr over 30 Minutes Intravenous Every 24 hours 05/01/21 1050 05/02/21 0938   04/27/21 1000  remdesivir 100 mg in  sodium chloride 0.9 % 100 mL IVPB  Status:  Discontinued       See Hyperspace for full Linked Orders Report.   100 mg 200 mL/hr over 30 Minutes Intravenous Daily 04/26/21 1348 04/26/21 1504   04/27/21 1000  cefTRIAXone (ROCEPHIN) 2 g in sodium chloride 0.9 % 100 mL IVPB        2 g 200 mL/hr over 30 Minutes Intravenous Every 24 hours 04/26/21 1500 05/01/21 0937   04/27/21 1000  azithromycin (ZITHROMAX) tablet 500 mg        500 mg Oral Daily 04/26/21 1500 05/01/21 0851   04/26/21 1500  remdesivir 200 mg in sodium chloride 0.9% 250 mL IVPB       See Hyperspace for full Linked Orders Report.   200 mg 580 mL/hr over 30 Minutes Intravenous Once 04/26/21 1348 04/26/21 1722   04/26/21 1400  cefTRIAXone (ROCEPHIN) 2 g in sodium chloride 0.9 % 100 mL IVPB  Status:  Discontinued        2 g 200 mL/hr over 30 Minutes Intravenous Every 24 hours 04/26/21 1348 04/27/21 0841   04/26/21 1400  azithromycin (ZITHROMAX) 500 mg in sodium chloride 0.9 % 250 mL IVPB  Status:  Discontinued        500 mg 250 mL/hr over 60 Minutes Intravenous Every 24 hours 04/26/21 1348 04/27/21 0840        MEDICATIONS: Scheduled Meds:  apixaban  2.5 mg Oral BID   balsalazide  2,250 mg Oral BID   benzonatate  100 mg Oral TID   diltiazem  60 mg Oral Q8H   feeding supplement (GLUCERNA SHAKE)  237 mL Oral BID BM   finasteride  5 mg Oral Daily   fluticasone furoate-vilanterol  1 puff Inhalation Daily   guaiFENesin  1,200 mg Oral BID   insulin aspart  0-5 Units Subcutaneous QHS   insulin aspart  0-9 Units Subcutaneous TID WC   insulin aspart  6 Units Subcutaneous TID WC   insulin detemir  24 Units Subcutaneous QHS   insulin detemir  38 Units Subcutaneous q morning   levothyroxine  50 mcg Oral Daily   linagliptin  5 mg Oral Daily   melatonin  5 mg Oral QHS   multivitamin with minerals  1 tablet Oral Daily   oxybutynin  5 mg Oral Daily   pantoprazole  40 mg Oral BID   Ensure Max Protein  11 oz Oral QHS   QUEtiapine  25  mg Oral QHS   rosuvastatin  20 mg Oral Daily   cyanocobalamin  2,000 mcg Oral Daily   Continuous Infusions:   PRN Meds:.acetaminophen **OR** acetaminophen, albuterol, guaiFENesin-dextromethorphan, metoprolol tartrate, ondansetron **OR** ondansetron (ZOFRAN) IV, senna-docusate, traZODone   I have personally reviewed following labs and imaging studies  LABORATORY DATA: CBC: Recent Labs  Lab 04/26/21 0351 04/27/21 0239 04/27/21 0330 04/28/21 0953 05/01/21 0141  WBC 16.4*  --  17.2* 21.4* 18.7*  NEUTROABS 14.4*  --   --  20.3*  --   HGB 13.9 13.6 13.5 12.8* 12.9*  HCT 42.1 40.0 40.6 38.6* 38.6*  MCV 80.3  --  80.7 80.2 78.6*  PLT 200  --  157 176 174     Basic Metabolic Panel: Recent Labs  Lab 04/26/21 0351 04/27/21 0239 04/27/21 0330 04/28/21 0953 04/29/21 0122 05/01/21 0141  NA 130* 132* 130* 133* 133* 134*  K 4.4 4.0 4.1 3.3* 4.1 3.8  CL 96*  --  98 102 102 104  CO2 22  --  18* 19* 20* 22  GLUCOSE 193*  --  197* 243* 221* 105*  BUN 28*  --  30* 40* 46* 36*  CREATININE 1.56*  --  1.61* 1.43* 1.44* 1.17  CALCIUM 9.4  --  8.6* 8.4* 8.6* 8.7*     GFR: Estimated Creatinine Clearance: 50.9 mL/min (by C-G formula based on SCr of 1.17 mg/dL).  Liver Function Tests: Recent Labs  Lab 04/26/21 0351 05/01/21 0141  AST 26 116*  ALT 26 55*  ALKPHOS 59 85  BILITOT 1.3* 0.5  PROT 6.5 5.5*  ALBUMIN 3.6 2.7*    No results for input(s): LIPASE, AMYLASE in the last 168 hours. No results for input(s): AMMONIA in the last 168 hours.  Coagulation Profile: No results for input(s): INR, PROTIME in the last 168 hours.  Cardiac Enzymes: No results for input(s): CKTOTAL, CKMB, CKMBINDEX, TROPONINI in the last 168 hours.  BNP (last 3 results) No results for input(s): PROBNP in the last 8760 hours.  Lipid Profile: No results for input(s): CHOL, HDL, LDLCALC, TRIG, CHOLHDL, LDLDIRECT in the last 72 hours.  Thyroid Function Tests: No results for input(s): TSH,  T4TOTAL, FREET4, T3FREE, THYROIDAB in the last 72 hours.  Anemia Panel: No results for input(s): VITAMINB12, FOLATE, FERRITIN, TIBC, IRON, RETICCTPCT in the last 72 hours.  Urine analysis: No results found for: COLORURINE, APPEARANCEUR, LABSPEC, PHURINE, GLUCOSEU, HGBUR, BILIRUBINUR, KETONESUR, PROTEINUR, UROBILINOGEN, NITRITE, LEUKOCYTESUR  Sepsis Labs: Lactic Acid, Venous No results found for: LATICACIDVEN  MICROBIOLOGY: Recent Results (from the past 240 hour(s))  Resp Panel by RT-PCR (Flu A&B, Covid) Nasopharyngeal Swab     Status: Abnormal   Collection Time: 04/26/21  3:43 AM   Specimen: Nasopharyngeal Swab; Nasopharyngeal(NP) swabs in vial transport medium  Result Value Ref Range Status   SARS Coronavirus 2 by RT PCR POSITIVE (A) NEGATIVE Final    Comment: RESULT CALLED TO, READ BACK BY AND VERIFIED WITH: RN BRITTANY O. 04/26/21@4 :50 BY TW (NOTE) SARS-CoV-2 target nucleic acids are DETECTED.  The SARS-CoV-2 RNA is generally detectable in upper respiratory specimens during the acute phase of infection. Positive results are indicative of the presence of the identified virus, but do not rule out bacterial infection or co-infection with other pathogens not detected by the test. Clinical correlation with patient history and other diagnostic information is necessary to determine patient infection status. The expected result is Negative.  Fact Sheet for Patients: EntrepreneurPulse.com.au  Fact Sheet for Healthcare Providers: IncredibleEmployment.be  This test is not yet approved or cleared by the [place] FDA and  has been authorized for detection and/or diagnosis of SARS-CoV-2 by FDA under an Emergency Use Authorization (EUA).  This EUA will remain in effect (meaning this test can b e used) for the duration of  the COVID-19 declaration under Section 564(b)(1) of the Act, 21 U.S.C. section 360bbb-3(b)(1), unless the authorization  is terminated or revoked sooner.     Influenza A by PCR NEGATIVE NEGATIVE Final   Influenza B by PCR NEGATIVE NEGATIVE Final    Comment: (NOTE) The Xpert Xpress SARS-CoV-2/FLU/RSV plus assay is intended as an aid in the diagnosis of influenza from Nasopharyngeal swab specimens and should not  be used as a sole basis for treatment. Nasal washings and aspirates are unacceptable for Xpert Xpress SARS-CoV-2/FLU/RSV testing.  Fact Sheet for Patients: EntrepreneurPulse.com.au  Fact Sheet for Healthcare Providers: IncredibleEmployment.be  This test is not yet approved or cleared by the [place] FDA and has been authorized for detection and/or diagnosis of SARS-CoV-2 by FDA under an Emergency Use Authorization (EUA). This EUA will remain in effect (meaning this test can be used) for the duration of the COVID-19 declaration under Section 564(b)(1) of the Act, 21 U.S.C. section 360bbb-3(b)(1), unless the authorization is terminated or revoked.  Performed at [facility], [place] [address], [place], [place] [address]   Culture, blood (routine x 2) Call MD if unable to obtain prior to antibiotics being given     Status: None   Collection Time: 04/26/21  3:15 PM   Specimen: BLOOD  Result Value Ref Range Status   Specimen Description BLOOD RIGHT ANTECUBITAL  Final   Special Requests   Final    BOTTLES DRAWN AEROBIC AND ANAEROBIC Blood Culture adequate volume   Culture   Final    NO GROWTH 5 DAYS Performed at [facility], [place] [address] [place], [place] [address]    Report Status 05/01/2021 FINAL  Final  Culture, blood (routine x 2) Call MD if unable to obtain prior to antibiotics being given     Status: None   Collection Time: 04/26/21  3:57 PM   Specimen: BLOOD LEFT HAND  Result Value Ref Range Status   Specimen Description BLOOD LEFT HAND  Final   Special Requests   Final    BOTTLES DRAWN AEROBIC AND ANAEROBIC Blood Culture  adequate volume   Culture   Final    NO GROWTH 5 DAYS Performed at [facility], [place] [address], [place], [place] [address]    Report Status 05/01/2021 FINAL  Final  Expectorated Sputum Assessment w Gram Stain, Rflx to Resp Cult     Status: None   Collection Time: 04/28/21  8:24 PM   Specimen: SPU  Result Value Ref Range Status   Specimen Description SPUTUM  Final   Special Requests NONE  Final   Sputum evaluation   Final    THIS SPECIMEN IS ACCEPTABLE FOR SPUTUM CULTURE Performed at [facility], [place] [address] [place], [place] [address]    Report Status 04/30/2021 FINAL  Final  Culture, Respiratory w Gram Stain     Status: None   Collection Time: 04/28/21  8:24 PM   Specimen: SPU  Result Value Ref Range Status   Specimen Description SPUTUM  Final   Special Requests NONE Reflexed from [id number]  Final   Gram Stain   Final    FEW SQUAMOUS EPITHELIAL CELLS PRESENT MODERATE WBC SEEN NO ORGANISMS SEEN Performed at [facility], [place] [address] [place],  [address]    Culture FEW CANDIDA ALBICANS  Final   Report Status 05/01/2021 FINAL  Final    RADIOLOGY STUDIES/RESULTS: DG Chest Port 1 View  Result Date: 05/01/2021 CLINICAL DATA:  Short of breath.  Recent COVID.  Cough and weakness EXAM: PORTABLE CHEST 1 VIEW COMPARISON:  04/26/2021 FINDINGS: Postop CABG. Cardiac enlargement with vascular congestion. No edema or effusion Bibasilar airspace disease shows mild interval improvement. IMPRESSION: Improvement in bibasilar atelectasis/infiltrate Pulmonary vascular congestion. Electronically Signed   By: Franchot Gallo M.D.   On: 05/01/2021 08:23   DG Swallowing Func-Speech Pathology  Result Date: 05/01/2021 Table formatting  from the original result was not included. Objective Swallowing Evaluation: Type of Study: MBS-Modified Barium Swallow Study  Patient Details Name: ANDRAY ASSEFA MRN: [id number] Date of Birth: 10-26-1940 Today's Date: 05/01/2021 Time: SLP Start  Time (ACUTE ONLY): 1400 -SLP Stop Time (ACUTE ONLY): 1420 SLP Time Calculation (min) (ACUTE ONLY): 20 min Past Medical History: Past Medical History: Diagnosis Date  Allergy   Anxiety   Asthma   since 2013  Barrett's esophagus 03/09/2014  Bilateral lumbar radiculopathy   since 2013  CAD S/P percutaneous coronary angioplasty 1987  a. 1987 angioplasty and BMS- Cx; b. 1997 CABG x 4;  c. MYOVIEW 1/'05: ? Ischemia vs. artifact --> sent for cath - patent grafts.  Chronic atrial fibrillation ([place])   a. Prev on amiodarone-->d/c 08/2016 in setting of recurrent AF, LFT abnormalities, and pulmonary symptoms;  b. CHA2DS2VASc = 4-->eliquis.  Chronic bronchitis ([place])   since 2007  COPD (chronic obstructive pulmonary disease) (HCC)   DDD (degenerative disc disease)   Diverticulosis   Dyslipidemia, goal LDL below 70   Doing better off of Zocor, currently on Lescol   ED (erectile dysfunction)   Elevated LFTs   Esophageal reflux   GERD (gastroesophageal reflux disease)   Gout, unspecified   Hearing loss   Helicobacter pylori gastritis 07/2009  Herpes zoster 2009  Hypothyroidism   since 2010  Iron deficiency anemia, unspecified   Leg weakness   a. 09/2016 ABI's: R 1.05, L 0.98.  Other and unspecified hyperlipidemia   Other B-complex deficiencies   Personal history of colonic polyps   Prostatitis   chronic ulcerative  Pulmonary fibrosis (HCC)   and bronchiectasis, since 2015  Renal stone   Right  S/P CABG x 4 1997  a) LIMA-LAD, SVG to OM, SVG-dRCA-RPL; b) FALSE + MYOVIEW --> CATH 1/'05: 100% LAD after widelly patent D1, all Cx OM branches 100%, pRCA 100%, SVG-PDA patent w/ retrograde filling of RPL, SVG-OM widely patent ~ normal OM, LIMA-LAD patent.  Spinal stenosis   ST elevation myocardial infarction (STEMI) of inferior wall, subsequent episode of care ([place]) 1987, 1997  a) PTCA of Cx; b) CABG   Type II or unspecified type diabetes mellitus without mention of complication, not stated as uncontrolled   no meds  Ulcerative colitis,  unspecified   Unspecified essential hypertension   Vitamin D deficiency  Past Surgical History: Past Surgical History: Procedure Laterality Date  BLEPHAROPLASTY Bilateral   for ptosis  CARDIAC CATHETERIZATION  08/17/2003  "False positive stress test " grafts patent; RCA proximal 100% LAD 100% occlusion after normal D1 with 80% ostial as SP1; circumflex patent but OM1 on to all occluded; EF 50-55%  CARDIAC CATHETERIZATION  11/1995  Preop CABG: LAD 90% at D1, circumflex 100 and OM a 90% proximal, 80% distal  Carotid Duplex Doppler  12/23/2009  Right&Left ICAs 0-49%, mildly abnormal study,   CHOLECYSTECTOMY    COLONOSCOPY    CORONARY ARTERY BYPASS GRAFT  11/1995  LIMA-LAD, SVG to OM, SVG-dRCA-RPL  ESOPHAGOGASTRODUODENOSCOPY    FLEXIBLE SIGMOIDOSCOPY    NM MYOCAR PERF EJECTION FRACTION  07/03/2003  FALSE POSITIVE TEST; Bruce protocol, negative test with scintigraphic evidence of inferoapical scar, diaphragmatic attenuation, moderate ischemia.  NM MYOVIEW LTD  2005  Questionable ischemia that is likely either infarct versus artifact; normal  TRANSTHORACIC ECHOCARDIOGRAM  12/2017  EF 55-60%.  Unable to assess diastolic function due to A. fib.  Moderate LA/RA dilation.  Mild MR. HPI: Pt is an 80 y.o. male who presented with productive cough  and SOB. Pt's wife reported on admission that pt has mild dementia with memory problems but never had aspiration before and no choking or cough after eating regular food. CXR 10/8: New patchy and nodular bibasilar pulmonary opacity since August,  compatible with bilateral pneumonia. Aspiration also a  consideration.  PMH: HTN, chronic A. fib, CAD s/p CABG 1997, IIDM, asthma/COPD, GERD/Barrett esophagus, hypothyroidism, CKD stage II, positive COVID-19 test on 04/03/21.  No data recorded Assessment / Plan / Recommendation CHL IP CLINICAL IMPRESSIONS 05/01/2021 Clinical Impression Pt presents with pharyngeal dysphagia characterized by reduced pharyngeal constriction, and reduced anterior  laryngeal movement which resulted in reduced epiglottic inversion and reduced airway protection. He demonstrated posterior pharyngeal wall residue, pyriform sinus residue, and penetration (PAS 5) as well as aspiration (PAS 7,8) with thin and nectar thick liquids. Delayed coughing was inconsistently noted following instances of aspiration, but this was ineffective. Laryngeal invasion was noted with honey thick liquids via straw, but not with cup. Immediate laryngeal invasion was improved to penetration (PAS 3) with smaller boluses of nectar thick liquids via cup and prompted coughing was able to expel the penetrate. However, pt demonstrated difficulty consistently demonstrating this. A chin tuck posture increased aspiration of pyriform sinus residue. A dysphagia 3 diet with honey thick liquids is recommended at this time. SLP will follow for dysphagia treatment. SLP Visit Diagnosis Dysphagia, oropharyngeal phase (R13.12) Attention and concentration deficit following -- Frontal lobe and executive function deficit following -- Impact on safety and function Moderate aspiration risk;Severe aspiration risk   CHL IP TREATMENT RECOMMENDATION 05/01/2021 Treatment Recommendations Therapy as outlined in treatment plan below   Prognosis 05/01/2021 Prognosis for Safe Diet Advancement Fair Barriers to Reach Goals Cognitive deficits;Severity of deficits Barriers/Prognosis Comment -- CHL IP DIET RECOMMENDATION 05/01/2021 SLP Diet Recommendations Dysphagia 3 (Mech soft) solids;Honey thick liquids Liquid Administration via Cup;No straw Medication Administration Whole meds with puree Compensations Slow rate;Small sips/bites;Follow solids with liquid Postural Changes Seated upright at 90 degrees   CHL IP OTHER RECOMMENDATIONS 05/01/2021 Recommended Consults -- Oral Care Recommendations Oral care BID Other Recommendations --   CHL IP FOLLOW UP RECOMMENDATIONS 05/01/2021 Follow up Recommendations Skilled Nursing facility   [facility] IP  FREQUENCY AND DURATION 05/01/2021 Speech Therapy Frequency (ACUTE ONLY) min 2x/week Treatment Duration 2 weeks      CHL IP ORAL PHASE 05/01/2021 Oral Phase Impaired Oral - Pudding Teaspoon -- Oral - Pudding Cup -- Oral - Honey Teaspoon -- Oral - Honey Cup -- Oral - Nectar Teaspoon -- Oral - Nectar Cup -- Oral - Nectar Straw -- Oral - Thin Teaspoon -- Oral - Thin Cup WFL Oral - Thin Straw WFL Oral - Puree WFL Oral - Mech Soft -- Oral - Regular Impaired mastication Oral - Multi-Consistency -- Oral - Pill -- Oral Phase - Comment --  CHL IP PHARYNGEAL PHASE 05/01/2021 Pharyngeal Phase Impaired Pharyngeal- Pudding Teaspoon -- Pharyngeal -- Pharyngeal- Pudding Cup -- Pharyngeal -- Pharyngeal- Honey Teaspoon -- Pharyngeal -- Pharyngeal- Honey Cup Reduced anterior laryngeal mobility;Reduced airway/laryngeal closure;Pharyngeal residue - pyriform;Pharyngeal residue - valleculae;Pharyngeal residue - posterior pharnyx;Reduced pharyngeal peristalsis Pharyngeal -- Pharyngeal- Nectar Teaspoon -- Pharyngeal -- Pharyngeal- Nectar Cup Reduced anterior laryngeal mobility;Reduced airway/laryngeal closure;Pharyngeal residue - pyriform;Pharyngeal residue - valleculae;Pharyngeal residue - posterior pharnyx;Reduced pharyngeal peristalsis;Penetration/Aspiration during swallow;Penetration/Apiration after swallow Pharyngeal Material enters airway, CONTACTS cords and not ejected out;Material enters airway, passes BELOW cords and not ejected out despite cough attempt by patient;Material enters airway, remains ABOVE vocal cords and not ejected out Pharyngeal- Nectar Straw Reduced  anterior laryngeal mobility;Reduced airway/laryngeal closure;Pharyngeal residue - pyriform;Pharyngeal residue - valleculae;Pharyngeal residue - posterior pharnyx;Reduced pharyngeal peristalsis;Penetration/Aspiration during swallow;Penetration/Apiration after swallow Pharyngeal Material enters airway, CONTACTS cords and not ejected out;Material enters airway, passes  BELOW cords and not ejected out despite cough attempt by patient;Material enters airway, passes BELOW cords without attempt by patient to eject out (silent aspiration) Pharyngeal- Thin Teaspoon NT Pharyngeal -- Pharyngeal- Thin Cup Reduced anterior laryngeal mobility;Reduced airway/laryngeal closure;Pharyngeal residue - pyriform;Pharyngeal residue - valleculae;Pharyngeal residue - posterior pharnyx;Reduced pharyngeal peristalsis;Penetration/Aspiration during swallow;Penetration/Apiration after swallow Pharyngeal Material enters airway, CONTACTS cords and not ejected out;Material enters airway, passes BELOW cords and not ejected out despite cough attempt by patient;Material enters airway, passes BELOW cords without attempt by patient to eject out (silent aspiration) Pharyngeal- Thin Straw -- Pharyngeal -- Pharyngeal- Puree Reduced anterior laryngeal mobility;Reduced airway/laryngeal closure;Pharyngeal residue - pyriform;Pharyngeal residue - valleculae;Pharyngeal residue - posterior pharnyx;Reduced pharyngeal peristalsis Pharyngeal -- Pharyngeal- Mechanical Soft Reduced anterior laryngeal mobility;Reduced airway/laryngeal closure;Pharyngeal residue - pyriform;Pharyngeal residue - valleculae;Pharyngeal residue - posterior pharnyx;Reduced pharyngeal peristalsis Pharyngeal -- Pharyngeal- Regular Reduced anterior laryngeal mobility;Reduced airway/laryngeal closure;Pharyngeal residue - pyriform;Pharyngeal residue - valleculae;Pharyngeal residue - posterior pharnyx;Reduced pharyngeal peristalsis Pharyngeal -- Pharyngeal- Multi-consistency -- Pharyngeal -- Pharyngeal- Pill -- Pharyngeal -- Pharyngeal Comment --  CHL IP CERVICAL ESOPHAGEAL PHASE 05/01/2021 Cervical Esophageal Phase WFL Pudding Teaspoon -- Pudding Cup -- Honey Teaspoon -- Honey Cup -- Nectar Teaspoon -- Nectar Cup -- Nectar Straw -- Thin Teaspoon -- Thin Cup -- Thin Straw -- Puree -- Mechanical Soft -- Regular -- Multi-consistency -- Pill -- Cervical  Esophageal Comment -- Shanika I. Hardin Negus, [place], [place] Office number [phone] Pager [phone] Horton Marshall 05/01/2021, 4:18 PM              ECHOCARDIOGRAM COMPLETE  Result Date: 05/01/2021    ECHOCARDIOGRAM REPORT   Patient Name:   STEPHAUN MILLION Date of Exam: 05/01/2021 Medical Rec #:  [id number]      Height:       72.0 in Accession #:    [id number]     Weight:       162.9 lb Date of Birth:  1940-09-05       BSA:          1.953 m Patient Age:    69 years       BP:           111/85 mmHg Patient Gender: M              HR:           101 bpm. Exam Location:  Inpatient Procedure: 2D Echo, Cardiac Doppler and Color Doppler Indications:     Acute Respiratory Distress  History:         Patient has prior history of Echocardiogram examinations, most                  recent 12/04/2018. Risk Factors:Hypertension and Family History                  of Coronary Artery Disease.  Sonographer:     Helmut Muster Referring Phys:  [place] Diagnosing Phys: Kirk Ruths MD IMPRESSIONS  1. Left ventricular ejection fraction, by estimation, is 60 to 65%. The left ventricle has normal function. The left ventricle has no regional wall motion abnormalities. Left ventricular diastolic function could not be evaluated.  2. Right ventricular systolic function is normal. The right ventricular size is normal.  3. Left  atrial size was moderately dilated.  4. Right atrial size was mildly dilated.  5. The mitral valve is normal in structure. No evidence of mitral valve regurgitation. No evidence of mitral stenosis.  6. The aortic valve has an indeterminant number of cusps. Aortic valve regurgitation is not visualized. No aortic stenosis is present.  7. Aortic dilatation noted. There is borderline dilatation of the aortic root, measuring 38 mm.  8. The inferior vena cava is normal in size with greater than 50% respiratory variability, suggesting right atrial pressure of 3 mmHg. FINDINGS   Left Ventricle: Left ventricular ejection fraction, by estimation, is 60 to 65%. The left ventricle has normal function. The left ventricle has no regional wall motion abnormalities. Definity contrast agent was given IV to delineate the left ventricular  endocardial borders. The left ventricular internal cavity size was normal in size. There is no left ventricular hypertrophy. Left ventricular diastolic function could not be evaluated due to atrial fibrillation. Left ventricular diastolic function could  not be evaluated. Right Ventricle: The right ventricular size is normal. Right ventricular systolic function is normal. Left Atrium: Left atrial size was moderately dilated. Right Atrium: Right atrial size was mildly dilated. Pericardium: There is no evidence of pericardial effusion. Mitral Valve: The mitral valve is normal in structure. No evidence of mitral valve regurgitation. No evidence of mitral valve stenosis. Tricuspid Valve: The tricuspid valve is normal in structure. Tricuspid valve regurgitation is trivial. No evidence of tricuspid stenosis. Aortic Valve: The aortic valve has an indeterminant number of cusps. Aortic valve regurgitation is not visualized. No aortic stenosis is present. Pulmonic Valve: The pulmonic valve was not well visualized. Pulmonic valve regurgitation is not visualized. No evidence of pulmonic stenosis. Aorta: Aortic dilatation noted. There is borderline dilatation of the aortic root, measuring 38 mm. Venous: The inferior vena cava is normal in size with greater than 50% respiratory variability, suggesting right atrial pressure of 3 mmHg. IAS/Shunts: No atrial level shunt detected by color flow Doppler.  LEFT VENTRICLE PLAX 2D LVIDd:         4.00 cm      Diastology LVIDs:         3.10 cm      LV e' medial:  6.64 cm/s LV PW:         1.00 cm      LV e' lateral: 8.27 cm/s LV IVS:        1.00 cm LVOT diam:     2.40 cm LV SV:         79 LV SV Index:   40 LVOT Area:     4.52 cm  LV Volumes  (MOD) LV vol d, MOD A2C: 96.4 ml LV vol d, MOD A4C: 102.7 ml LV vol s, MOD A2C: 50.4 ml LV vol s, MOD A4C: 54.3 ml LV SV MOD A2C:     46.0 ml LV SV MOD A4C:     102.7 ml LV SV MOD BP:      47.3 ml RIGHT VENTRICLE             IVC RV S prime:     13.30 cm/s  IVC diam: 2.30 cm TAPSE (M-mode): 2.1 cm LEFT ATRIUM             Index        RIGHT ATRIUM           Index LA diam:        3.70 cm 1.89 cm/m   RA  Area:     22.30 cm LA Vol (A2C):   75.6 ml 38.71 ml/m  RA Volume:   68.70 ml  35.18 ml/m LA Vol (A4C):   77.5 ml 39.69 ml/m LA Biplane Vol: 78.0 ml 39.94 ml/m  AORTIC VALVE LVOT Vmax:   99.90 cm/s LVOT Vmean:  67.000 cm/s LVOT VTI:    0.174 m  AORTA Ao Root diam: 3.80 cm Ao Asc diam:  3.95 cm TRICUSPID VALVE TR Peak grad:   16.3 mmHg TR Vmax:        202.00 cm/s  SHUNTS Systemic VTI:  0.17 m Systemic Diam: 2.40 cm Kirk Ruths MD Electronically signed by Kirk Ruths MD Signature Date/Time: 05/01/2021/2:32:14 PM    Final      LOS: 6 days   Oren Binet, MD  [facility]    To contact the attending provider between 7A-7P or the covering provider during after hours 7P-7A, please log into the web site [email or web address] and access using universal [place] password for that web site. If you do not have the password, please call the hospital operator.  05/02/2021, 12:14 PM

## 2021-05-02 NOTE — TOC Progression Note (Addendum)
Transition of Care [facility]) - Progression Note    Patient Details  Name: Timothy Mclean MRN: [id number] Date of Birth: 04/23/1941  Transition of Care [facility]) CM/SW [place], LCSW Phone Number: 05/02/2021, 2:04 PM  Clinical Narrative:    2pm-CSW spoke with patient's spouse regarding SNF recommendation. She stated patient walked with a cane at home but now she needs him to be back on his feet before she can assist him. CSW provided SNF bed offers. She selected [facility]. CSW reached out to see if they can accept patient; CSW can then begin insurance authorization.   4pm-[facility] able to accept patient but not until Monday. CSW started insurance authorization with [employer] for SNF and transport.    Expected Discharge Plan: Skilled Nursing Facility Barriers to Discharge: Insurance Authorization  Expected Discharge Plan and Services Expected Discharge Plan: [place] In-house Referral: Clinical Social Work Discharge Planning Services: CM Consult Post Acute Care Choice: [place] Living arrangements for the past 2 months: [place]                 DME Arranged: [place] rolling DME Agency: [employer] Date DME Agency Contacted: 04/29/21 Time DME Agency Contacted: [id number] Representative spoke with at DME Agency: Freda Munro HH Arranged: PT, OT [facility] Agency: [place] Date [place]: 04/30/21 Time [place]: [phone] Representative spoke with at [place]: Cindie   Social Determinants of Health ([place]) Interventions    Readmission Risk Interventions No flowsheet data found.

## 2021-05-02 NOTE — NC FL2 (Addendum)
[place] LEVEL OF CARE SCREENING TOOL     IDENTIFICATION  Patient Name: Timothy Mclean Birthdate: [birth year] Sex: male Admission Date (Current Location): 04/26/2021  [facility] and [place] Number:  Herbalist and Address:  [facility], [place] [address], [place], [place] [address]      Provider Number: [id number]  Attending Physician Name and Address:  Jonetta Osgood, MD  Relative Name and Phone Number:       Current Level of Care: Hospital Recommended Level of Care: [place] Prior Approval Number:    Date Approved/Denied:   PASRR Number: [id number]  Discharge Plan: SNF    Current Diagnoses: Patient Active Problem List   Diagnosis Date Noted   Malnutrition of moderate degree 04/29/2021   Community acquired pneumonia 04/26/2021   PNA (pneumonia) 04/26/2021   COVID-19 virus infection    Leg swelling 59/56/3875   Systolic ejection murmur 64/33/2951   Weakness of lower extremity 09/07/2016   Barrett's esophagus 03/09/2014   Hypotension due to drugs 03/06/2014   Emphysema of lung ([place]) 02/19/2014   Cough 02/19/2014   H/O amiodarone therapy 02/16/2013   S/P CABG x 4    Permanent atrial fibrillation [facility]): CHA2DS2-VASc Score =4, On Eliquis. Diltiazem for rate control.    Hyperlipidemia associated with type 2 diabetes mellitus ([place])    Personal history of colonic polyps 09/10/2011   UC (ulcerative colitis) ([place]) 09/10/2011   Anticoagulant long-term use 09/10/2011   VITAMIN B12 DEFICIENCY 01/30/2009   Type 2 diabetes mellitus with complication - CAD 88/41/6606   GOUT 10/31/2007   Essential hypertension 10/31/2007   ACID REFLUX DISEASE 10/31/2007   CAD S/P PTCA-PCI then CABG x4 12/01/1995   ST elevation myocardial infarction (STEMI) of inferior wall, subsequent episode of care ([place]) 10/30/1985    Orientation RESPIRATION BLADDER Height & Weight     Self  O2-nasal cannula 2L Incontinent, External catheter  Weight: 162 lb 14.7 oz (73.9 kg) Height:  6' (182.9 cm)  BEHAVIORAL SYMPTOMS/MOOD NEUROLOGICAL BOWEL NUTRITION STATUS      Incontinent Diet (see dc summary)  AMBULATORY STATUS COMMUNICATION OF NEEDS Skin   Extensive Assist Verbally                         Personal Care Assistance Level of Assistance  Bathing, Feeding, Dressing Bathing Assistance: Maximum assistance Feeding assistance: Limited assistance Dressing Assistance: Maximum assistance     Functional Limitations Info  Sight, Hearing Sight Info: Impaired Hearing Info: Impaired      SPECIAL CARE FACTORS FREQUENCY  PT (By licensed PT), OT (By licensed OT)     PT Frequency: 5x/week OT Frequency: 5x/week            Contractures Contractures Info: Not present    Additional Factors Info  Code Status, Allergies, Insulin Sliding Scale, Psychotropic Code Status Info: Full Allergies Info: Atorvastatin, Fluvastatin, Lisinopril, Metformin And Related, Pravastatin, Simvastatin, Spiriva Handihaler (Tiotropium Bromide Monohydrate) Psychotropic Info: Seroquel Insulin Sliding Scale Info: See dc summary       Current Medications (05/02/2021):  This is the current hospital active medication list Current Facility-Administered Medications  Medication Dose Route Frequency Provider Last Rate Last Admin   acetaminophen (TYLENOL) tablet 650 mg  650 mg Oral Q6H PRN Wynetta Fines T, MD       Or   acetaminophen (TYLENOL) suppository 650 mg  650 mg Rectal Q6H PRN Lequita Halt, MD  albuterol (VENTOLIN HFA) 108 (90 Base) MCG/ACT inhaler 2 puff  2 puff Inhalation Q4H PRN Chotiner, Yevonne Aline, MD       apixaban Arne [place]) tablet 2.5 mg  2.5 mg Oral BID Wynetta Fines T, MD   2.5 mg at 05/02/21 0851   balsalazide (COLAZAL) capsule 2,250 mg  2,250 mg Oral BID Wynetta Fines T, MD   2,250 mg at 05/02/21 0850   benzonatate (TESSALON) capsule 100 mg  100 mg Oral TID Wynetta Fines T, MD   100 mg at 05/02/21 0851   cefTRIAXone (ROCEPHIN) 2 g in  sodium chloride 0.9 % 100 mL IVPB  2 g Intravenous Q24H Pham, Minh Q, RPH-CPP 200 mL/hr at 05/02/21 0908 2 g at 05/02/21 0908   diltiazem (CARDIZEM) tablet 60 mg  60 mg Oral Q8H Wynetta Fines T, MD   60 mg at 05/02/21 0522   feeding supplement (GLUCERNA SHAKE) (GLUCERNA SHAKE) liquid 237 mL  237 mL Oral BID BM Jonetta Osgood, MD   237 mL at 05/02/21 0853   finasteride (PROSCAR) tablet 5 mg  5 mg Oral Daily Wynetta Fines T, MD   5 mg at 05/02/21 0850   fluticasone furoate-vilanterol (BREO ELLIPTA) 200-25 MCG/INH 1 puff  1 puff Inhalation Daily Heloise Purpura, RPH   1 puff at 05/02/21 0853   guaiFENesin (MUCINEX) 12 hr tablet 1,200 mg  1,200 mg Oral BID Wynetta Fines T, MD   1,200 mg at 05/02/21 0850   guaiFENesin-dextromethorphan (ROBITUSSIN DM) 100-10 MG/5ML syrup 10 mL  10 mL Oral Q6H PRN Chotiner, Yevonne Aline, MD   10 mL at 04/30/21 0607   insulin aspart (novoLOG) injection 0-5 Units  0-5 Units Subcutaneous QHS Charlynne Cousins, MD   3 Units at 05/01/21 2021   insulin aspart (novoLOG) injection 0-9 Units  0-9 Units Subcutaneous TID WC Charlynne Cousins, MD   1 Units at 05/02/21 0853   insulin aspart (novoLOG) injection 6 Units  6 Units Subcutaneous TID WC Jonetta Osgood, MD   6 Units at 05/02/21 0852   insulin detemir (LEVEMIR) injection 24 Units  24 Units Subcutaneous QHS Jonetta Osgood, MD   24 Units at 05/01/21 2228   insulin detemir (LEVEMIR) injection 38 Units  38 Units Subcutaneous q morning Jonetta Osgood, MD   38 Units at 05/02/21 3614   levothyroxine (SYNTHROID) tablet 50 mcg  50 mcg Oral Daily Wynetta Fines T, MD   50 mcg at 05/02/21 0522   linagliptin (TRADJENTA) tablet 5 mg  5 mg Oral Daily Wynetta Fines T, MD   5 mg at 05/02/21 0851   melatonin tablet 5 mg  5 mg Oral QHS Jonetta Osgood, MD   5 mg at 05/01/21 2018   metoprolol tartrate (LOPRESSOR) injection 2.5 mg  2.5 mg Intravenous Q6H PRN Lequita Halt, MD       multivitamin with minerals tablet 1 tablet  1 tablet  Oral Daily Charlynne Cousins, MD   1 tablet at 05/02/21 0851   ondansetron (ZOFRAN) tablet 4 mg  4 mg Oral Q6H PRN Wynetta Fines T, MD       Or   ondansetron [facility]) injection 4 mg  4 mg Intravenous Q6H PRN Wynetta Fines T, MD   4 mg at 05/01/21 2032   oxybutynin (DITROPAN) tablet 5 mg  5 mg Oral Daily Wynetta Fines T, MD   5 mg at 05/02/21 0851   pantoprazole (PROTONIX) EC tablet 40 mg  40 mg Oral  BID Wynetta Fines T, MD   40 mg at 05/02/21 0851   protein supplement (ENSURE MAX) liquid  11 oz Oral QHS Jonetta Osgood, MD   11 oz at 05/01/21 2037   QUEtiapine (SEROQUEL) tablet 25 mg  25 mg Oral QHS Jonetta Osgood, MD   25 mg at 05/01/21 2019   rosuvastatin (CRESTOR) tablet 20 mg  20 mg Oral Daily Wynetta Fines T, MD   20 mg at 05/02/21 0851   senna-docusate (Senokot-S) tablet 1 tablet  1 tablet Oral QHS PRN Lequita Halt, MD       traZODone (DESYREL) tablet 25 mg  25 mg Oral QHS PRN Jonetta Osgood, MD   25 mg at 04/30/21 2109   vitamin B-12 (CYANOCOBALAMIN) tablet 2,000 mcg  2,000 mcg Oral Daily Lequita Halt, MD   2,000 mcg at 05/02/21 0160     Discharge Medications: Please see discharge summary for a list of discharge medications.  Relevant Imaging Results:  Relevant Lab Results:   Additional Information SSN: [id number]. COVID + on 04/26/21  Lissa Morales Brynnly Bonet, LCSW

## 2021-05-02 NOTE — Progress Notes (Signed)
Speech Language Pathology Treatment: Dysphagia  Patient Details Name: Timothy Mclean MRN: [id number] DOB: 15-Oct-1940 Today's Date: 05/02/2021 Time: 5102-5852 SLP Time Calculation (min) (ACUTE ONLY): 15 min  Assessment / Plan / Recommendation Clinical Impression  Pt was seen during dinner for dysphagia treatment with his son present. The meal included a rice and barley pilaf, crispy baked chicken with gravy, green beans, peach cobbler and honey thick liquids. Pt's son reported that the pt has been tolerating the current diet well, but with occasional coughing as her does at baseline. Pt tolerated solids and liquids via cup without overt s/sx of aspiration when swallowing precautions were observed. A single cough was noted once with liquids when  pt consumed a large bolus of liquids. Pt  was re-educated on swallowing precautions and his observance of reduced bolus sizes was improved following education. Pt's current diet of dysphagia 3 solids and honey thick liquids will be continued. SLP will continue to follow pt.    HPI HPI: Pt is an 80 y.o. male who presented with productive cough and SOB. Pt's wife reported on admission that pt has mild dementia with memory problems but never had aspiration before and no choking or cough after eating regular food. CXR 10/8: New patchy and nodular bibasilar pulmonary opacity since August,  compatible with bilateral pneumonia. Aspiration also a  consideration.  PMH: HTN, chronic A. fib, CAD s/p CABG 1997, IIDM, asthma/COPD, GERD/Barrett esophagus, hypothyroidism, CKD stage II, positive COVID-19 test on 04/03/21.      SLP Plan  Continue with current plan of care      Recommendations for follow up therapy are one component of a multi-disciplinary discharge planning process, led by the attending physician.  Recommendations may be updated based on patient status, additional functional criteria and insurance authorization.    Recommendations  Diet recommendations:  Dysphagia 3 (mechanical soft);Honey-thick liquid Liquids provided via: Cup;No straw Medication Administration: Whole meds with puree Supervision: Patient able to self feed Compensations: Slow rate;Small sips/bites Postural Changes and/or Swallow Maneuvers: Seated upright 90 degrees;Upright 30-60 min after meal                Oral Care Recommendations: Oral care BID Follow up Recommendations: None SLP Visit Diagnosis: Dysphagia, unspecified (R13.10) Plan: Continue with current plan of care       Vani Gunner I. Hardin Negus, [place], [place] Office number [phone] Pager [place]  05/02/2021, 5:27 PM

## 2021-05-03 LAB — GLUCOSE, CAPILLARY
Glucose-Capillary: 67 mg/dL — ABNORMAL LOW (ref 70–99)
Glucose-Capillary: 263 mg/dL — ABNORMAL HIGH (ref 70–99)
Glucose-Capillary: 224 mg/dL — ABNORMAL HIGH (ref 70–99)
Glucose-Capillary: 345 mg/dL — ABNORMAL HIGH (ref 70–99)

## 2021-05-03 MED ORDER — INSULIN DETEMIR 100 UNIT/ML ~~LOC~~ SOLN
18.0000 [IU] | Freq: Every day | SUBCUTANEOUS | Status: DC
  Administered 2021-05-03 – 2021-05-04 (×2): 18 [IU] via SUBCUTANEOUS
  Filled 2021-05-03 (×3): qty 0.18

## 2021-05-03 NOTE — Progress Notes (Signed)
PROGRESS NOTE        PATIENT DETAILS Name: Timothy Mclean Age: 80 y.o. Sex: male Date of Birth: 12-May-1941 Admit Date: 04/26/2021 Admitting Physician Lequita Halt, MD [id number], Collier Salina, MD  Brief Narrative: Patient is a 80 y.o. male with history of COVID-19 infection on 9/15 (completed paxlovid/prednisone as an outpatient), dementia, atrial fibrillation on Eliquis, CAD s/p CABG, DM-2 who presented with shortness of breath-he was found to have acute hypoxic respiratory failure-due to community-acquired pneumonia-and subsequently admitted to the hospitalist service.  Subjective: Lying comfortably in bed-pleasantly confused-son at bedside.  No major events overnight.  Objective: Vitals: Blood pressure 111/63, pulse 77, temperature 97.7 F (36.5 C), temperature source Oral, resp. rate 17, height 6' (1.829 m), weight 73.9 kg, SpO2 94 %.   Exam: Gen Exam:not in any distress HEENT:atraumatic, normocephalic Chest: B/L clear to auscultation anteriorly CVS:S1S2 regular Abdomen:soft non tender, non distended Extremities:no edema Neurology: Non focal Skin: no rash   Pertinent Labs/Radiology: 10/8>>Blood culture: No growth 10/10>> sputum culture: Few yeast-pending final result  Assessment/Plan: Acute hypoxic respiratory failure due to community-acquired pneumonia: Probable post viral/COVID pneumonia.clinically improved-Down to 2-3 L of oxygen.  Has completed a course of antimicrobial therapy.    Recent COVID-19 infection: Not felt to have active COVID-19 infection at this point.  Completed outpatient treatment as noted above.  CKD stage IIIb: Creatinine close to baseline  History of ulcerative colitis: Continue balsalazide-appears to be controlled.  GERD/Barrett's esophagus: Continue PPI-continue outpatient follow-up with GI.  Chronic atrial fibrillation: Rate controlled with diltiazem-anticoagulated with Eliquis.  HLD: Continue statin  Hypothyroidism:  Continue Synthroid  DM-2 (A1c 7.7 on 10/8): Hypoglycemic episode reoccurred this morning-decrease evening dose of Levemir to 18 units, continue Levemir 38 units in the morning.  Continue 6 units of Premeal NovoLog and SSI.  Reassess on 10/16  Recent Labs    05/02/21 2144 05/03/21 0735 05/03/21 1151  GLUCAP 175* 67* 263*     Dementia with delirium: Delirium/sleep better after starting low-dose Seroquel.  Continue supportive care.   Nutrition Status: Nutrition Problem: Moderate Malnutrition Etiology: chronic illness Signs/Symptoms: moderate fat depletion, severe muscle depletion Interventions: Ensure Enlive (each supplement provides 350kcal and 20 grams of protein), MVI, Magic cup   Procedures: None Consults: None DVT Prophylaxis: Eliquis Code Status:Full code Family Communication: Son at bedside  Time spent: 33 minutes-Greater than 50% of this time was spent in counseling, explanation of diagnosis, planning of further management, and coordination of care.  Diet: Diet Order             DIET DYS 3 Room service appropriate? Yes with Assist; Fluid consistency: Honey Thick  Diet effective now                      Disposition Plan: Status is: Inpatient  Remains inpatient appropriate because:Inpatient level of care appropriate due to severity of illness  Dispo: The patient is from: Home              Anticipated d/c is to: SNF.              Patient currently is not medically stable to d/c.   Difficult to place patient No   Barriers to Discharge: Hypoxia with O2 requirement-still on IV antibiotics-not yet stable for discharge.  Antimicrobial agents: Anti-infectives (From admission, onward)    Start  Dose/Rate Route Frequency Ordered Stop   05/02/21 1000  cefTRIAXone (ROCEPHIN) 2 g in sodium chloride 0.9 % 100 mL IVPB        2 g 200 mL/hr over 30 Minutes Intravenous Every 24 hours 05/01/21 1050 05/02/21 0938   04/27/21 1000  remdesivir 100 mg in sodium  chloride 0.9 % 100 mL IVPB  Status:  Discontinued       See Hyperspace for full Linked Orders Report.   100 mg 200 mL/hr over 30 Minutes Intravenous Daily 04/26/21 1348 04/26/21 1504   04/27/21 1000  cefTRIAXone (ROCEPHIN) 2 g in sodium chloride 0.9 % 100 mL IVPB        2 g 200 mL/hr over 30 Minutes Intravenous Every 24 hours 04/26/21 1500 05/01/21 0937   04/27/21 1000  azithromycin (ZITHROMAX) tablet 500 mg        500 mg Oral Daily 04/26/21 1500 05/01/21 0851   04/26/21 1500  remdesivir 200 mg in sodium chloride 0.9% 250 mL IVPB       See Hyperspace for full Linked Orders Report.   200 mg 580 mL/hr over 30 Minutes Intravenous Once 04/26/21 1348 04/26/21 1722   04/26/21 1400  cefTRIAXone (ROCEPHIN) 2 g in sodium chloride 0.9 % 100 mL IVPB  Status:  Discontinued        2 g 200 mL/hr over 30 Minutes Intravenous Every 24 hours 04/26/21 1348 04/27/21 0841   04/26/21 1400  azithromycin (ZITHROMAX) 500 mg in sodium chloride 0.9 % 250 mL IVPB  Status:  Discontinued        500 mg 250 mL/hr over 60 Minutes Intravenous Every 24 hours 04/26/21 1348 04/27/21 0840        MEDICATIONS: Scheduled Meds:  apixaban  2.5 mg Oral BID   balsalazide  2,250 mg Oral BID   benzonatate  100 mg Oral TID   diltiazem  60 mg Oral Q8H   feeding supplement (GLUCERNA SHAKE)  237 mL Oral BID BM   finasteride  5 mg Oral Daily   fluticasone furoate-vilanterol  1 puff Inhalation Daily   guaiFENesin  1,200 mg Oral BID   insulin aspart  0-5 Units Subcutaneous QHS   insulin aspart  0-9 Units Subcutaneous TID WC   insulin aspart  6 Units Subcutaneous TID WC   insulin detemir  24 Units Subcutaneous QHS   insulin detemir  38 Units Subcutaneous q morning   levothyroxine  50 mcg Oral Daily   linagliptin  5 mg Oral Daily   melatonin  5 mg Oral QHS   multivitamin with minerals  1 tablet Oral Daily   oxybutynin  5 mg Oral Daily   pantoprazole  40 mg Oral BID   Ensure Max Protein  11 oz Oral QHS   QUEtiapine  25 mg Oral  QHS   rosuvastatin  20 mg Oral Daily   cyanocobalamin  2,000 mcg Oral Daily   Continuous Infusions:   PRN Meds:.acetaminophen **OR** acetaminophen, albuterol, guaiFENesin-dextromethorphan, metoprolol tartrate, ondansetron **OR** ondansetron (ZOFRAN) IV, senna-docusate, traZODone   I have personally reviewed following labs and imaging studies  LABORATORY DATA: CBC: Recent Labs  Lab 04/27/21 0239 04/27/21 0330 04/28/21 0953 05/01/21 0141  WBC  --  17.2* 21.4* 18.7*  NEUTROABS  --   --  20.3*  --   HGB 13.6 13.5 12.8* 12.9*  HCT 40.0 40.6 38.6* 38.6*  MCV  --  80.7 80.2 78.6*  PLT  --  157 176 174     Basic Metabolic Panel:  Recent Labs  Lab 04/27/21 0239 04/27/21 0330 04/28/21 0953 04/29/21 0122 05/01/21 0141  NA 132* 130* 133* 133* 134*  K 4.0 4.1 3.3* 4.1 3.8  CL  --  98 102 102 104  CO2  --  18* 19* 20* 22  GLUCOSE  --  197* 243* 221* 105*  BUN  --  30* 40* 46* 36*  CREATININE  --  1.61* 1.43* 1.44* 1.17  CALCIUM  --  8.6* 8.4* 8.6* 8.7*     GFR: Estimated Creatinine Clearance: 50.9 mL/min (by C-G formula based on SCr of 1.17 mg/dL).  Liver Function Tests: Recent Labs  Lab 05/01/21 0141  AST 116*  ALT 55*  ALKPHOS 85  BILITOT 0.5  PROT 5.5*  ALBUMIN 2.7*    No results for input(s): LIPASE, AMYLASE in the last 168 hours. No results for input(s): AMMONIA in the last 168 hours.  Coagulation Profile: No results for input(s): INR, PROTIME in the last 168 hours.  Cardiac Enzymes: No results for input(s): CKTOTAL, CKMB, CKMBINDEX, TROPONINI in the last 168 hours.  BNP (last 3 results) No results for input(s): PROBNP in the last 8760 hours.  Lipid Profile: No results for input(s): CHOL, HDL, LDLCALC, TRIG, CHOLHDL, LDLDIRECT in the last 72 hours.  Thyroid Function Tests: No results for input(s): TSH, T4TOTAL, FREET4, T3FREE, THYROIDAB in the last 72 hours.  Anemia Panel: No results for input(s): VITAMINB12, FOLATE, FERRITIN, TIBC, IRON,  RETICCTPCT in the last 72 hours.  Urine analysis: No results found for: COLORURINE, APPEARANCEUR, LABSPEC, PHURINE, GLUCOSEU, HGBUR, BILIRUBINUR, KETONESUR, PROTEINUR, UROBILINOGEN, NITRITE, LEUKOCYTESUR  Sepsis Labs: Lactic Acid, Venous No results found for: LATICACIDVEN  MICROBIOLOGY: Recent Results (from the past 240 hour(s))  Resp Panel by RT-PCR (Flu A&B, Covid) Nasopharyngeal Swab     Status: Abnormal   Collection Time: 04/26/21  3:43 AM   Specimen: Nasopharyngeal Swab; Nasopharyngeal(NP) swabs in vial transport medium  Result Value Ref Range Status   SARS Coronavirus 2 by RT PCR POSITIVE (A) NEGATIVE Final    Comment: RESULT CALLED TO, READ BACK BY AND VERIFIED WITH: RN BRITTANY O. 04/26/21@4 :50 BY TW (NOTE) SARS-CoV-2 target nucleic acids are DETECTED.  The SARS-CoV-2 RNA is generally detectable in upper respiratory specimens during the acute phase of infection. Positive results are indicative of the presence of the identified virus, but do not rule out bacterial infection or co-infection with other pathogens not detected by the test. Clinical correlation with patient history and other diagnostic information is necessary to determine patient infection status. The expected result is Negative.  Fact Sheet for Patients: EntrepreneurPulse.com.au  Fact Sheet for Healthcare Providers: IncredibleEmployment.be  This test is not yet approved or cleared by the [place] FDA and  has been authorized for detection and/or diagnosis of SARS-CoV-2 by FDA under an Emergency Use Authorization (EUA).  This EUA will remain in effect (meaning this test can b e used) for the duration of  the COVID-19 declaration under Section 564(b)(1) of the Act, 21 U.S.C. section 360bbb-3(b)(1), unless the authorization is terminated or revoked sooner.     Influenza A by PCR NEGATIVE NEGATIVE Final   Influenza B by PCR NEGATIVE NEGATIVE Final    Comment:  (NOTE) The Xpert Xpress SARS-CoV-2/FLU/RSV plus assay is intended as an aid in the diagnosis of influenza from Nasopharyngeal swab specimens and should not be used as a sole basis for treatment. Nasal washings and aspirates are unacceptable for Xpert Xpress SARS-CoV-2/FLU/RSV testing.  Fact Sheet for Patients: EntrepreneurPulse.com.au  Fact Sheet  for Healthcare Providers: IncredibleEmployment.be  This test is not yet approved or cleared by the [place] and has been authorized for detection and/or diagnosis of SARS-CoV-2 by FDA under an Emergency Use Authorization (EUA). This EUA will remain in effect (meaning this test can be used) for the duration of the COVID-19 declaration under Section 564(b)(1) of the Act, 21 U.S.C. section 360bbb-3(b)(1), unless the authorization is terminated or revoked.  Performed at [facility], [place] [address] [place], [place] [address]   Culture, blood (routine x 2) Call MD if unable to obtain prior to antibiotics being given     Status: None   Collection Time: 04/26/21  3:15 PM   Specimen: BLOOD  Result Value Ref Range Status   Specimen Description BLOOD RIGHT ANTECUBITAL  Final   Special Requests   Final    BOTTLES DRAWN AEROBIC AND ANAEROBIC Blood Culture adequate volume   Culture   Final    NO GROWTH 5 DAYS Performed at [facility], [place] [address], [place], [place] [address]    Report Status 05/01/2021 FINAL  Final  Culture, blood (routine x 2) Call MD if unable to obtain prior to antibiotics being given     Status: None   Collection Time: 04/26/21  3:57 PM   Specimen: BLOOD LEFT HAND  Result Value Ref Range Status   Specimen Description BLOOD LEFT HAND  Final   Special Requests   Final    BOTTLES DRAWN AEROBIC AND ANAEROBIC Blood Culture adequate volume   Culture   Final    NO GROWTH 5 DAYS Performed at [facility], [place] [address], [place], [place] [address]    Report  Status 05/01/2021 FINAL  Final  Expectorated Sputum Assessment w Gram Stain, Rflx to Resp Cult     Status: None   Collection Time: 04/28/21  8:24 PM   Specimen: SPU  Result Value Ref Range Status   Specimen Description SPUTUM  Final   Special Requests NONE  Final   Sputum evaluation   Final    THIS SPECIMEN IS ACCEPTABLE FOR SPUTUM CULTURE Performed at [facility], [place] [address], [place], [place] [address]    Report Status 04/30/2021 FINAL  Final  Culture, Respiratory w Gram Stain     Status: None   Collection Time: 04/28/21  8:24 PM   Specimen: SPU  Result Value Ref Range Status   Specimen Description SPUTUM  Final   Special Requests NONE Reflexed from [id number]  Final   Gram Stain   Final    FEW SQUAMOUS EPITHELIAL CELLS PRESENT MODERATE WBC SEEN NO ORGANISMS SEEN Performed at [facility], [place] [address], [place], [place] [address]    Culture FEW CANDIDA ALBICANS  Final   Report Status 05/01/2021 FINAL  Final    RADIOLOGY STUDIES/RESULTS: DG Swallowing Func-Speech Pathology  Result Date: 05/01/2021 Table formatting from the original result was not included. Objective Swallowing Evaluation: Type of Study: MBS-Modified Barium Swallow Study  Patient Details Name: SOLLY DERASMO MRN: [id number] Date of Birth: 1940-11-10 Today's Date: 05/01/2021 Time: SLP Start Time (ACUTE ONLY): 1400 -SLP Stop Time (ACUTE ONLY): 1420 SLP Time Calculation (min) (ACUTE ONLY): 20 min Past Medical History: Past Medical History: Diagnosis Date  Allergy   Anxiety   Asthma   since 2013  Barrett's esophagus 03/09/2014  Bilateral lumbar radiculopathy   since 2013  CAD S/P percutaneous coronary angioplasty 1987  a. 1987 angioplasty and BMS- Cx; b.  1997 CABG x 4;  c. MYOVIEW 1/'05: ? Ischemia vs. artifact --> sent for cath - patent grafts.  Chronic atrial fibrillation ([place])   a. Prev on amiodarone-->d/c 08/2016 in setting of recurrent AF, LFT abnormalities, and pulmonary symptoms;  b. CHA2DS2VASc =  4-->eliquis.  Chronic bronchitis ([place])   since 2007  COPD (chronic obstructive pulmonary disease) (HCC)   DDD (degenerative disc disease)   Diverticulosis   Dyslipidemia, goal LDL below 70   Doing better off of Zocor, currently on Lescol   ED (erectile dysfunction)   Elevated LFTs   Esophageal reflux   GERD (gastroesophageal reflux disease)   Gout, unspecified   Hearing loss   Helicobacter pylori gastritis 07/2009  Herpes zoster 2009  Hypothyroidism   since 2010  Iron deficiency anemia, unspecified   Leg weakness   a. 09/2016 ABI's: R 1.05, L 0.98.  Other and unspecified hyperlipidemia   Other B-complex deficiencies   Personal history of colonic polyps   Prostatitis   chronic ulcerative  Pulmonary fibrosis (HCC)   and bronchiectasis, since 2015  Renal stone   Right  S/P CABG x 4 1997  a) LIMA-LAD, SVG to OM, SVG-dRCA-RPL; b) FALSE + MYOVIEW --> CATH 1/'05: 100% LAD after widelly patent D1, all Cx OM branches 100%, pRCA 100%, SVG-PDA patent w/ retrograde filling of RPL, SVG-OM widely patent ~ normal OM, LIMA-LAD patent.  Spinal stenosis   ST elevation myocardial infarction (STEMI) of inferior wall, subsequent episode of care ([place]) 1987, 1997  a) PTCA of Cx; b) CABG   Type II or unspecified type diabetes mellitus without mention of complication, not stated as uncontrolled   no meds  Ulcerative colitis, unspecified   Unspecified essential hypertension   Vitamin D deficiency  Past Surgical History: Past Surgical History: Procedure Laterality Date  BLEPHAROPLASTY Bilateral   for ptosis  CARDIAC CATHETERIZATION  08/17/2003  "False positive stress test " grafts patent; RCA proximal 100% LAD 100% occlusion after normal D1 with 80% ostial as SP1; circumflex patent but OM1 on to all occluded; EF 50-55%  CARDIAC CATHETERIZATION  11/1995  Preop CABG: LAD 90% at D1, circumflex 100 and OM a 90% proximal, 80% distal  Carotid Duplex Doppler  12/23/2009  Right&Left ICAs 0-49%, mildly abnormal study,   CHOLECYSTECTOMY    COLONOSCOPY     CORONARY ARTERY BYPASS GRAFT  11/1995  LIMA-LAD, SVG to OM, SVG-dRCA-RPL  ESOPHAGOGASTRODUODENOSCOPY    FLEXIBLE SIGMOIDOSCOPY    NM MYOCAR PERF EJECTION FRACTION  07/03/2003  FALSE POSITIVE TEST; Bruce protocol, negative test with scintigraphic evidence of inferoapical scar, diaphragmatic attenuation, moderate ischemia.  NM MYOVIEW LTD  2005  Questionable ischemia that is likely either infarct versus artifact; normal  TRANSTHORACIC ECHOCARDIOGRAM  12/2017  EF 55-60%.  Unable to assess diastolic function due to A. fib.  Moderate LA/RA dilation.  Mild MR. HPI: Pt is an 80 y.o. male who presented with productive cough and SOB. Pt's wife reported on admission that pt has mild dementia with memory problems but never had aspiration before and no choking or cough after eating regular food. CXR 10/8: New patchy and nodular bibasilar pulmonary opacity since August,  compatible with bilateral pneumonia. Aspiration also a  consideration.  PMH: HTN, chronic A. fib, CAD s/p CABG 1997, IIDM, asthma/COPD, GERD/Barrett esophagus, hypothyroidism, CKD stage II, positive COVID-19 test on 04/03/21.  No data recorded Assessment / Plan / Recommendation CHL IP CLINICAL IMPRESSIONS 05/01/2021 Clinical Impression Pt presents with pharyngeal dysphagia characterized by reduced pharyngeal constriction, and reduced  anterior laryngeal movement which resulted in reduced epiglottic inversion and reduced airway protection. He demonstrated posterior pharyngeal wall residue, pyriform sinus residue, and penetration (PAS 5) as well as aspiration (PAS 7,8) with thin and nectar thick liquids. Delayed coughing was inconsistently noted following instances of aspiration, but this was ineffective. Laryngeal invasion was noted with honey thick liquids via straw, but not with cup. Immediate laryngeal invasion was improved to penetration (PAS 3) with smaller boluses of nectar thick liquids via cup and prompted coughing was able to expel the penetrate. However,  pt demonstrated difficulty consistently demonstrating this. A chin tuck posture increased aspiration of pyriform sinus residue. A dysphagia 3 diet with honey thick liquids is recommended at this time. SLP will follow for dysphagia treatment. SLP Visit Diagnosis Dysphagia, oropharyngeal phase (R13.12) Attention and concentration deficit following -- Frontal lobe and executive function deficit following -- Impact on safety and function Moderate aspiration risk;Severe aspiration risk   CHL IP TREATMENT RECOMMENDATION 05/01/2021 Treatment Recommendations Therapy as outlined in treatment plan below   Prognosis 05/01/2021 Prognosis for Safe Diet Advancement Fair Barriers to Reach Goals Cognitive deficits;Severity of deficits Barriers/Prognosis Comment -- CHL IP DIET RECOMMENDATION 05/01/2021 SLP Diet Recommendations Dysphagia 3 (Mech soft) solids;Honey thick liquids Liquid Administration via Cup;No straw Medication Administration Whole meds with puree Compensations Slow rate;Small sips/bites;Follow solids with liquid Postural Changes Seated upright at 90 degrees   CHL IP OTHER RECOMMENDATIONS 05/01/2021 Recommended Consults -- Oral Care Recommendations Oral care BID Other Recommendations --   CHL IP FOLLOW UP RECOMMENDATIONS 05/01/2021 Follow up Recommendations Skilled Nursing facility   [facility] IP FREQUENCY AND DURATION 05/01/2021 Speech Therapy Frequency (ACUTE ONLY) min 2x/week Treatment Duration 2 weeks      CHL IP ORAL PHASE 05/01/2021 Oral Phase Impaired Oral - Pudding Teaspoon -- Oral - Pudding Cup -- Oral - Honey Teaspoon -- Oral - Honey Cup -- Oral - Nectar Teaspoon -- Oral - Nectar Cup -- Oral - Nectar Straw -- Oral - Thin Teaspoon -- Oral - Thin Cup WFL Oral - Thin Straw WFL Oral - Puree WFL Oral - Mech Soft -- Oral - Regular Impaired mastication Oral - Multi-Consistency -- Oral - Pill -- Oral Phase - Comment --  CHL IP PHARYNGEAL PHASE 05/01/2021 Pharyngeal Phase Impaired Pharyngeal- Pudding Teaspoon -- Pharyngeal  -- Pharyngeal- Pudding Cup -- Pharyngeal -- Pharyngeal- Honey Teaspoon -- Pharyngeal -- Pharyngeal- Honey Cup Reduced anterior laryngeal mobility;Reduced airway/laryngeal closure;Pharyngeal residue - pyriform;Pharyngeal residue - valleculae;Pharyngeal residue - posterior pharnyx;Reduced pharyngeal peristalsis Pharyngeal -- Pharyngeal- Nectar Teaspoon -- Pharyngeal -- Pharyngeal- Nectar Cup Reduced anterior laryngeal mobility;Reduced airway/laryngeal closure;Pharyngeal residue - pyriform;Pharyngeal residue - valleculae;Pharyngeal residue - posterior pharnyx;Reduced pharyngeal peristalsis;Penetration/Aspiration during swallow;Penetration/Apiration after swallow Pharyngeal Material enters airway, CONTACTS cords and not ejected out;Material enters airway, passes BELOW cords and not ejected out despite cough attempt by patient;Material enters airway, remains ABOVE vocal cords and not ejected out Pharyngeal- Nectar Straw Reduced anterior laryngeal mobility;Reduced airway/laryngeal closure;Pharyngeal residue - pyriform;Pharyngeal residue - valleculae;Pharyngeal residue - posterior pharnyx;Reduced pharyngeal peristalsis;Penetration/Aspiration during swallow;Penetration/Apiration after swallow Pharyngeal Material enters airway, CONTACTS cords and not ejected out;Material enters airway, passes BELOW cords and not ejected out despite cough attempt by patient;Material enters airway, passes BELOW cords without attempt by patient to eject out (silent aspiration) Pharyngeal- Thin Teaspoon NT Pharyngeal -- Pharyngeal- Thin Cup Reduced anterior laryngeal mobility;Reduced airway/laryngeal closure;Pharyngeal residue - pyriform;Pharyngeal residue - valleculae;Pharyngeal residue - posterior pharnyx;Reduced pharyngeal peristalsis;Penetration/Aspiration during swallow;Penetration/Apiration after swallow Pharyngeal Material enters airway, CONTACTS cords and not ejected out;Material enters  airway, passes BELOW cords and not ejected out  despite cough attempt by patient;Material enters airway, passes BELOW cords without attempt by patient to eject out (silent aspiration) Pharyngeal- Thin Straw -- Pharyngeal -- Pharyngeal- Puree Reduced anterior laryngeal mobility;Reduced airway/laryngeal closure;Pharyngeal residue - pyriform;Pharyngeal residue - valleculae;Pharyngeal residue - posterior pharnyx;Reduced pharyngeal peristalsis Pharyngeal -- Pharyngeal- Mechanical Soft Reduced anterior laryngeal mobility;Reduced airway/laryngeal closure;Pharyngeal residue - pyriform;Pharyngeal residue - valleculae;Pharyngeal residue - posterior pharnyx;Reduced pharyngeal peristalsis Pharyngeal -- Pharyngeal- Regular Reduced anterior laryngeal mobility;Reduced airway/laryngeal closure;Pharyngeal residue - pyriform;Pharyngeal residue - valleculae;Pharyngeal residue - posterior pharnyx;Reduced pharyngeal peristalsis Pharyngeal -- Pharyngeal- Multi-consistency -- Pharyngeal -- Pharyngeal- Pill -- Pharyngeal -- Pharyngeal Comment --  CHL IP CERVICAL ESOPHAGEAL PHASE 05/01/2021 Cervical Esophageal Phase WFL Pudding Teaspoon -- Pudding Cup -- Honey Teaspoon -- Honey Cup -- Nectar Teaspoon -- Nectar Cup -- Nectar Straw -- Thin Teaspoon -- Thin Cup -- Thin Straw -- Puree -- Mechanical Soft -- Regular -- Multi-consistency -- Pill -- Cervical Esophageal Comment -- Shanika I. Hardin Negus, [place], [place] Office number [phone] Pager [phone] Horton Marshall 05/01/2021, 4:18 PM                LOS: 7 days   Oren Binet, MD  [facility]    To contact the attending provider between 7A-7P or the covering provider during after hours 7P-7A, please log into the web site [email or web address] and access using universal [place] password for that web site. If you do not have the password, please call the hospital operator.  05/03/2021, 1:57 PM

## 2021-05-03 NOTE — TOC Progression Note (Signed)
Transition of Care [facility]) - Progression Note    Patient Details  Name: Timothy Mclean MRN: [id number] Date of Birth: May 03, 1941  Transition of Care [facility]) CM/SW Contact  Bartholomew Crews, RN Phone Number: [phone] 05/03/2021, 12:18 PM  Clinical Narrative:     Received call from [place] at [facility] [phone] that patient has been approved for ambulance transport [phone] - authorization is good for 90 days. Patient has also been approved for SNF at [facility] [phone] for 7 days, patient has 5 business days to make transition to SNF. CSW notified.   Expected Discharge Plan: Skilled Nursing Facility Barriers to Discharge: Insurance Authorization  Expected Discharge Plan and Services Expected Discharge Plan: [place] In-house Referral: Clinical Social Work Discharge Planning Services: CM Consult Post Acute Care Choice: [place] Living arrangements for the past 2 months: [place]                 DME Arranged: [place] DME Agency: [employer] Date DME Agency Contacted: 04/29/21 Time DME Agency Contacted: [phone] Representative spoke with at DME Agency: Freda Munro HH Arranged: PT, OT [facility] Agency: [place] Date [place]: 04/30/21 Time [place]: [id number] Representative spoke with at [place]: Cindie   Social Determinants of Health ([place]) Interventions    Readmission Risk Interventions No flowsheet data found.

## 2021-05-04 LAB — GLUCOSE, CAPILLARY
Glucose-Capillary: 116 mg/dL — ABNORMAL HIGH (ref 70–99)
Glucose-Capillary: 248 mg/dL — ABNORMAL HIGH (ref 70–99)
Glucose-Capillary: 271 mg/dL — ABNORMAL HIGH (ref 70–99)
Glucose-Capillary: 194 mg/dL — ABNORMAL HIGH (ref 70–99)

## 2021-05-04 LAB — CBC
HCT: 41.9 % (ref 39.0–52.0)
Hemoglobin: 13.9 g/dL (ref 13.0–17.0)
MCH: 26.5 pg (ref 26.0–34.0)
MCHC: 33.2 g/dL (ref 30.0–36.0)
MCV: 79.8 fL — ABNORMAL LOW (ref 80.0–100.0)
Platelets: 178 10*3/uL (ref 150–400)
RBC: 5.25 MIL/uL (ref 4.22–5.81)
RDW: 17.1 % — ABNORMAL HIGH (ref 11.5–15.5)
WBC: 13.4 10*3/uL — ABNORMAL HIGH (ref 4.0–10.5)
nRBC: 0 % (ref 0.0–0.2)

## 2021-05-04 LAB — BASIC METABOLIC PANEL
Anion gap: 8 (ref 5–15)
BUN: 36 mg/dL — ABNORMAL HIGH (ref 8–23)
CO2: 25 mmol/L (ref 22–32)
Calcium: 8.8 mg/dL — ABNORMAL LOW (ref 8.9–10.3)
Chloride: 105 mmol/L (ref 98–111)
Creatinine, Ser: 1.31 mg/dL — ABNORMAL HIGH (ref 0.61–1.24)
GFR, Estimated: 54 mL/min — ABNORMAL LOW (ref >60)
Glucose, Bld: 152 mg/dL — ABNORMAL HIGH (ref 70–99)
Potassium: 3.7 mmol/L (ref 3.5–5.1)
Sodium: 138 mmol/L (ref 135–145)

## 2021-05-04 NOTE — Progress Notes (Signed)
PROGRESS NOTE        PATIENT DETAILS Name: Timothy Mclean Age: 80 y.o. Sex: male Date of Birth: 1940-12-12 Admit Date: 04/26/2021 Admitting Physician Lequita Halt, MD SHF:WYOVZCHY, Collier [place], MD  Brief Narrative: Patient is a 80 y.o. male with history of COVID-19 infection on 9/15 (completed paxlovid/prednisone as an outpatient), dementia, atrial fibrillation on Eliquis, CAD s/p CABG, DM-2 who presented with shortness of breath-he was found to have acute hypoxic respiratory failure-due to community-acquired pneumonia-and subsequently admitted to the hospitalist service.  Subjective: No major issues overnight-son at bedside-on 2 L of oxygen this morning.  Objective: Vitals: Blood pressure (!) 153/63, pulse 95, temperature 97.6 F (36.4 C), temperature source Axillary, resp. rate 15, height 6' (1.829 m), weight 73.9 kg, SpO2 95 %.   Exam: Gen Exam:Alert awake-not in any distress HEENT:atraumatic, normocephalic Chest: B/L clear to auscultation anteriorly CVS:S1S2 regular Abdomen:soft non tender, non distended Extremities:no edema Neurology: Non focal Skin: no rash   Pertinent Labs/Radiology: 10/8>>Blood culture: No growth 10/10>> sputum culture: Few yeast-pending final result  Assessment/Plan: Acute hypoxic respiratory failure due to community-acquired pneumonia: Probable post viral/COVID pneumonia.clinically improved-Down to 2-3 L of oxygen.  Has completed a course of antimicrobial therapy.    Recent COVID-19 infection: Not felt to have active COVID-19 infection at this point.  Completed outpatient treatment as noted above.  CKD stage IIIb: Creatinine close to baseline  History of ulcerative colitis: Continue balsalazide-appears to be controlled.  GERD/Barrett's esophagus: Continue PPI-continue outpatient follow-up with GI.  Chronic atrial fibrillation: Rate controlled with diltiazem-anticoagulated with Eliquis.  HLD: Continue  statin  Hypothyroidism: Continue Synthroid  DM-2 (A1c 7.7 on 10/8): CBGs relatively stable-continue 38 units of Levemir in the morning, 18 units of Levemir in the evening along with 6 units of NovoLog with meals.  Remains on SSI.  Follow.  Recent Labs    05/03/21 2106 05/04/21 0735 05/04/21 1147  GLUCAP 345* 116* 248*     Dementia with delirium: Delirium/sleep better after starting low-dose Seroquel.  Continue supportive care.   Nutrition Status: Nutrition Problem: Moderate Malnutrition Etiology: chronic illness Signs/Symptoms: moderate fat depletion, severe muscle depletion Interventions: Ensure Enlive (each supplement provides 350kcal and 20 grams of protein), MVI, Magic cup   Procedures: None Consults: None DVT Prophylaxis: Eliquis Code Status:Full code Family Communication: Son at bedside  Time spent: 76 minutes-Greater than 50% of this time was spent in counseling, explanation of diagnosis, planning of further management, and coordination of care.  Diet: Diet Order             DIET DYS 3 Room service appropriate? Yes with Assist; Fluid consistency: Honey Thick  Diet effective now                      Disposition Plan: Status is: Inpatient  Remains inpatient appropriate because:Inpatient level of care appropriate due to severity of illness  Dispo: The patient is from: Home              Anticipated d/c is to: SNF.              Patient currently is medically stable to d/c.   Difficult to place patient No   Barriers to Discharge: Awaiting SNF bed. Antimicrobial agents: Anti-infectives (From admission, onward)    Start     Dose/Rate Route Frequency Ordered Stop  05/02/21 1000  cefTRIAXone (ROCEPHIN) 2 g in sodium chloride 0.9 % 100 mL IVPB        2 g 200 mL/hr over 30 Minutes Intravenous Every 24 hours 05/01/21 1050 05/02/21 0938   04/27/21 1000  remdesivir 100 mg in sodium chloride 0.9 % 100 mL IVPB  Status:  Discontinued       See Hyperspace for  full Linked Orders Report.   100 mg 200 mL/hr over 30 Minutes Intravenous Daily 04/26/21 1348 04/26/21 1504   04/27/21 1000  cefTRIAXone (ROCEPHIN) 2 g in sodium chloride 0.9 % 100 mL IVPB        2 g 200 mL/hr over 30 Minutes Intravenous Every 24 hours 04/26/21 1500 05/01/21 0937   04/27/21 1000  azithromycin (ZITHROMAX) tablet 500 mg        500 mg Oral Daily 04/26/21 1500 05/01/21 0851   04/26/21 1500  remdesivir 200 mg in sodium chloride 0.9% 250 mL IVPB       See Hyperspace for full Linked Orders Report.   200 mg 580 mL/hr over 30 Minutes Intravenous Once 04/26/21 1348 04/26/21 1722   04/26/21 1400  cefTRIAXone (ROCEPHIN) 2 g in sodium chloride 0.9 % 100 mL IVPB  Status:  Discontinued        2 g 200 mL/hr over 30 Minutes Intravenous Every 24 hours 04/26/21 1348 04/27/21 0841   04/26/21 1400  azithromycin (ZITHROMAX) 500 mg in sodium chloride 0.9 % 250 mL IVPB  Status:  Discontinued        500 mg 250 mL/hr over 60 Minutes Intravenous Every 24 hours 04/26/21 1348 04/27/21 0840        MEDICATIONS: Scheduled Meds:  apixaban  2.5 mg Oral BID   balsalazide  2,250 mg Oral BID   benzonatate  100 mg Oral TID   diltiazem  60 mg Oral Q8H   feeding supplement (GLUCERNA SHAKE)  237 mL Oral BID BM   finasteride  5 mg Oral Daily   fluticasone furoate-vilanterol  1 puff Inhalation Daily   guaiFENesin  1,200 mg Oral BID   insulin aspart  0-5 Units Subcutaneous QHS   insulin aspart  0-9 Units Subcutaneous TID WC   insulin aspart  6 Units Subcutaneous TID WC   insulin detemir  18 Units Subcutaneous QHS   insulin detemir  38 Units Subcutaneous q morning   levothyroxine  50 mcg Oral Daily   linagliptin  5 mg Oral Daily   melatonin  5 mg Oral QHS   multivitamin with minerals  1 tablet Oral Daily   oxybutynin  5 mg Oral Daily   pantoprazole  40 mg Oral BID   Ensure Max Protein  11 oz Oral QHS   QUEtiapine  25 mg Oral QHS   rosuvastatin  20 mg Oral Daily   cyanocobalamin  2,000 mcg Oral  Daily   Continuous Infusions:   PRN Meds:.acetaminophen **OR** acetaminophen, albuterol, guaiFENesin-dextromethorphan, metoprolol tartrate, ondansetron **OR** ondansetron (ZOFRAN) IV, senna-docusate, traZODone   I have personally reviewed following labs and imaging studies  LABORATORY DATA: CBC: Recent Labs  Lab 04/28/21 0953 05/01/21 0141 05/04/21 0130  WBC 21.4* 18.7* 13.4*  NEUTROABS 20.3*  --   --   HGB 12.8* 12.9* 13.9  HCT 38.6* 38.6* 41.9  MCV 80.2 78.6* 79.8*  PLT 176 174 178     Basic Metabolic Panel: Recent Labs  Lab 04/28/21 0953 04/29/21 0122 05/01/21 0141 05/04/21 0130  NA 133* 133* 134* 138  K 3.3* 4.1 3.8  3.7  CL 102 102 104 105  CO2 19* 20* 22 25  GLUCOSE 243* 221* 105* 152*  BUN 40* 46* 36* 36*  CREATININE 1.43* 1.44* 1.17 1.31*  CALCIUM 8.4* 8.6* 8.7* 8.8*     GFR: Estimated Creatinine Clearance: 45.4 mL/min (A) (by C-G formula based on SCr of 1.31 mg/dL (H)).  Liver Function Tests: Recent Labs  Lab 05/01/21 0141  AST 116*  ALT 55*  ALKPHOS 85  BILITOT 0.5  PROT 5.5*  ALBUMIN 2.7*    No results for input(s): LIPASE, AMYLASE in the last 168 hours. No results for input(s): AMMONIA in the last 168 hours.  Coagulation Profile: No results for input(s): INR, PROTIME in the last 168 hours.  Cardiac Enzymes: No results for input(s): CKTOTAL, CKMB, CKMBINDEX, TROPONINI in the last 168 hours.  BNP (last 3 results) No results for input(s): PROBNP in the last 8760 hours.  Lipid Profile: No results for input(s): CHOL, HDL, LDLCALC, TRIG, CHOLHDL, LDLDIRECT in the last 72 hours.  Thyroid Function Tests: No results for input(s): TSH, T4TOTAL, FREET4, T3FREE, THYROIDAB in the last 72 hours.  Anemia Panel: No results for input(s): VITAMINB12, FOLATE, FERRITIN, TIBC, IRON, RETICCTPCT in the last 72 hours.  Urine analysis: No results found for: COLORURINE, APPEARANCEUR, LABSPEC, PHURINE, GLUCOSEU, HGBUR, BILIRUBINUR, KETONESUR,  PROTEINUR, UROBILINOGEN, NITRITE, LEUKOCYTESUR  Sepsis Labs: Lactic Acid, Venous No results found for: LATICACIDVEN  MICROBIOLOGY: Recent Results (from the past 240 hour(s))  Resp Panel by RT-PCR (Flu A&B, Covid) Nasopharyngeal Swab     Status: Abnormal   Collection Time: 04/26/21  3:43 AM   Specimen: Nasopharyngeal Swab; Nasopharyngeal(NP) swabs in vial transport medium  Result Value Ref Range Status   SARS Coronavirus 2 by RT PCR POSITIVE (A) NEGATIVE Final    Comment: RESULT CALLED TO, READ BACK BY AND VERIFIED WITH: RN BRITTANY O. 04/26/21@4 :50 BY TW (NOTE) SARS-CoV-2 target nucleic acids are DETECTED.  The SARS-CoV-2 RNA is generally detectable in upper respiratory specimens during the acute phase of infection. Positive results are indicative of the presence of the identified virus, but do not rule out bacterial infection or co-infection with other pathogens not detected by the test. Clinical correlation with patient history and other diagnostic information is necessary to determine patient infection status. The expected result is Negative.  Fact Sheet for Patients: EntrepreneurPulse.com.au  Fact Sheet for Healthcare Providers: IncredibleEmployment.be  This test is not yet approved or cleared by the [place] FDA and  has been authorized for detection and/or diagnosis of SARS-CoV-2 by FDA under an Emergency Use Authorization (EUA).  This EUA will remain in effect (meaning this test can b e used) for the duration of  the COVID-19 declaration under Section 564(b)(1) of the Act, 21 U.S.C. section 360bbb-3(b)(1), unless the authorization is terminated or revoked sooner.     Influenza A by PCR NEGATIVE NEGATIVE Final   Influenza B by PCR NEGATIVE NEGATIVE Final    Comment: (NOTE) The Xpert Xpress SARS-CoV-2/FLU/RSV plus assay is intended as an aid in the diagnosis of influenza from Nasopharyngeal swab specimens and should not be  used as a sole basis for treatment. Nasal washings and aspirates are unacceptable for Xpert Xpress SARS-CoV-2/FLU/RSV testing.  Fact Sheet for Patients: EntrepreneurPulse.com.au  Fact Sheet for Healthcare Providers: IncredibleEmployment.be  This test is not yet approved or cleared by the [place] FDA and has been authorized for detection and/or diagnosis of SARS-CoV-2 by FDA under an Emergency Use Authorization (EUA). This EUA will remain in effect (meaning  this test can be used) for the duration of the COVID-19 declaration under Section 564(b)(1) of the Act, 21 U.S.C. section 360bbb-3(b)(1), unless the authorization is terminated or revoked.  Performed at [facility], [place] [address], [place], [place] [address]   Culture, blood (routine x 2) Call MD if unable to obtain prior to antibiotics being given     Status: None   Collection Time: 04/26/21  3:15 PM   Specimen: BLOOD  Result Value Ref Range Status   Specimen Description BLOOD RIGHT ANTECUBITAL  Final   Special Requests   Final    BOTTLES DRAWN AEROBIC AND ANAEROBIC Blood Culture adequate volume   Culture   Final    NO GROWTH 5 DAYS Performed at [facility], [place] [address] [place], [place] [address]    Report Status 05/01/2021 FINAL  Final  Culture, blood (routine x 2) Call MD if unable to obtain prior to antibiotics being given     Status: None   Collection Time: 04/26/21  3:57 PM   Specimen: BLOOD LEFT HAND  Result Value Ref Range Status   Specimen Description BLOOD LEFT HAND  Final   Special Requests   Final    BOTTLES DRAWN AEROBIC AND ANAEROBIC Blood Culture adequate volume   Culture   Final    NO GROWTH 5 DAYS Performed at [facility], [place] [address], [place], [place] [address]    Report Status 05/01/2021 FINAL  Final  Expectorated Sputum Assessment w Gram Stain, Rflx to Resp Cult     Status: None   Collection Time: 04/28/21  8:24 PM   Specimen: SPU   Result Value Ref Range Status   Specimen Description SPUTUM  Final   Special Requests NONE  Final   Sputum evaluation   Final    THIS SPECIMEN IS ACCEPTABLE FOR SPUTUM CULTURE Performed at [facility], [place] [address] [place], [place] [address]    Report Status 04/30/2021 FINAL  Final  Culture, Respiratory w Gram Stain     Status: None   Collection Time: 04/28/21  8:24 PM   Specimen: SPU  Result Value Ref Range Status   Specimen Description SPUTUM  Final   Special Requests NONE Reflexed from [id number]  Final   Gram Stain   Final    FEW SQUAMOUS EPITHELIAL CELLS PRESENT MODERATE WBC SEEN NO ORGANISMS SEEN Performed at [facility], [place] [address], [place], [place] [address]    Culture FEW CANDIDA ALBICANS  Final   Report Status 05/01/2021 FINAL  Final    RADIOLOGY STUDIES/RESULTS: No results found.   LOS: 8 days   Oren Binet, MD  [facility]    To contact the attending provider between 7A-7P or the covering provider during after hours 7P-7A, please log into the web site [email or web address] and access using universal [place] password for that web site. If you do not have the password, please call the hospital operator.  05/04/2021, 1:56 PM

## 2021-05-05 DIAGNOSIS — R778 Other specified abnormalities of plasma proteins: Secondary | ICD-10-CM

## 2021-05-05 LAB — GLUCOSE, CAPILLARY
Glucose-Capillary: 134 mg/dL — ABNORMAL HIGH (ref 70–99)
Glucose-Capillary: 147 mg/dL — ABNORMAL HIGH (ref 70–99)

## 2021-05-05 MED ORDER — GLUCERNA SHAKE PO LIQD: 237.0000 mL | Freq: Two times a day (BID) | ORAL | 0 refills | Status: DC

## 2021-05-05 MED ORDER — LINAGLIPTIN 5 MG PO TABS: 5.0000 mg | ORAL_TABLET | Freq: Every day | ORAL | Status: DC

## 2021-05-05 MED ORDER — ENSURE MAX PROTEIN PO LIQD: 11.0000 [oz_av] | Freq: Every day | ORAL | Status: DC

## 2021-05-05 MED ORDER — INSULIN DETEMIR 100 UNIT/ML ~~LOC~~ SOLN: 38.0000 [IU] | Freq: Every morning | SUBCUTANEOUS | 11 refills | Status: DC

## 2021-05-05 MED ORDER — INSULIN ASPART 100 UNIT/ML IJ SOLN: 6.0000 [IU] | Freq: Three times a day (TID) | INTRAMUSCULAR | 11 refills | Status: DC

## 2021-05-05 MED ORDER — INSULIN DETEMIR 100 UNIT/ML ~~LOC~~ SOLN: 15.0000 [IU] | Freq: Every day | SUBCUTANEOUS | 11 refills | Status: DC

## 2021-05-05 MED ORDER — QUETIAPINE FUMARATE 25 MG PO TABS: 25.0000 mg | ORAL_TABLET | Freq: Every day | ORAL | Status: DC

## 2021-05-05 MED ORDER — INSULIN ASPART 100 UNIT/ML IJ SOLN: 0.0000 [IU] | Freq: Three times a day (TID) | INTRAMUSCULAR | 11 refills | Status: DC

## 2021-05-05 MED ORDER — MELATONIN 5 MG PO TABS: 5.0000 mg | ORAL_TABLET | Freq: Every day | ORAL | 0 refills | Status: DC

## 2021-05-05 NOTE — Discharge Summary (Addendum)
PATIENT DETAILS Name: Timothy Mclean Age: 80 y.o. Sex: male Date of Birth: September 29, 1940 MRN: [id number]. Admitting Physician: Lequita Halt, MD MMN:[id number], Collier Salina, MD  Admit Date: 04/26/2021 Discharge date: 05/05/2021  Recommendations for Outpatient Follow-up:  Follow up with PCP in 1-2 weeks Please obtain CMP/CBC in one week Please repeat chest x-ray in 4 to 6 weeks to ensure resolution of infiltrates. Titrate off oxygen slowly over the next few days.  Admitted From:  Home  Disposition: SNF   Home Health: No  Equipment/Devices: None  Discharge Condition: Stable  CODE STATUS: FULL CODE  Diet recommendation:  Diet Order             Diet - low sodium heart healthy           DIET DYS 3 Room service appropriate? Yes with Assist; Fluid consistency: Honey Thick  Diet effective now                    Brief Summary: Patient is a 81 y.o. male with history of COVID-19 infection on 9/15 (completed paxlovid/prednisone as an outpatient), dementia, atrial fibrillation on Eliquis, CAD s/p CABG, DM-2 who presented with shortness of breath-he was found to have acute hypoxic respiratory failure-due to community-acquired pneumonia-and subsequently admitted to the hospitalist service.  Brief Hospital Course: Acute hypoxic respiratory failure due to community-acquired pneumonia: Probable post viral/COVID pneumonia.clinically improved-Down to 2 L of oxygen (was on 9-10 L of oxygen-during the early part of this hospital stay) .  Has completed a course of antimicrobial therapy.     Recent COVID-19 infection: Not felt to have active COVID-19 infection at this point.  Completed outpatient treatment as noted above.   CKD stage IIIb: Creatinine close to baseline   History of ulcerative colitis: Continue balsalazide-appears to be controlled.   GERD/Barrett's esophagus: Continue PPI-continue outpatient follow-up with GI.   Chronic atrial fibrillation: Rate controlled with  diltiazem-anticoagulated with Eliquis.   HLD: Continue statin   Hypothyroidism: Continue Synthroid   DM-2 (A1c 7.7 on 10/8): CBGs relatively stable-continue 38 units of Levemir in the morning, 15 units of Levemir in the evening along with 6 units of NovoLog with meals.  Remains on SSI.  Follow.  Dementia with delirium: Delirium/sleep better after starting low-dose Seroquel.  Continue supportive care.    Nutrition Status: Nutrition Problem: Moderate Malnutrition Etiology: chronic illness Signs/Symptoms: moderate fat depletion, severe muscle depletion Interventions: Ensure Enlive (each supplement provides 350kcal and 20 grams of protein), MVI, Magic cup  Procedures   Discharge Diagnoses:  Active Problems:   Community acquired pneumonia   PNA (pneumonia)   Malnutrition of moderate degree   Discharge Instructions:  Activity:  As tolerated with Full fall precautions use walker/cane & assistance as needed   Discharge Instructions     Call MD for:  difficulty breathing, headache or visual disturbances   Complete by: As directed    Diet - low sodium heart healthy   Complete by: As directed    Discharge instructions   Complete by: As directed    Follow with Primary MD  Derinda Late, MD in 1-2 weeks  Please get a complete blood count and chemistry panel checked by your Primary MD at your next visit, and again as instructed by your Primary MD.  Get Medicines reviewed and adjusted: Please take all your medications with you for your next visit with your Primary MD  Laboratory/radiological data: Please request your Primary MD to go over all hospital tests and procedure/radiological  results at the follow up, please ask your Primary MD to get all Hospital records sent to his/her office.  In some cases, they will be blood work, cultures and biopsy results pending at the time of your discharge. Please request that your primary care M.D. follows up on these results.  Also Note the  following: If you experience worsening of your admission symptoms, develop shortness of breath, life threatening emergency, suicidal or homicidal thoughts you must seek medical attention immediately by calling 911 or calling your MD immediately  if symptoms less severe.  You must read complete instructions/literature along with all the possible adverse reactions/side effects for all the Medicines you take and that have been prescribed to you. Take any new Medicines after you have completely understood and accpet all the possible adverse reactions/side effects.   Do not drive when taking Pain medications or sleeping medications (Benzodaizepines)  Do not take more than prescribed Pain, Sleep and Anxiety Medications. It is not advisable to combine anxiety,sleep and pain medications without talking with your primary care practitioner  Special Instructions: If you have smoked or chewed Tobacco  in the last 2 yrs please stop smoking, stop any regular Alcohol  and or any Recreational drug use.  Wear Seat belts while driving.  Please note: You were cared for by a hospitalist during your hospital stay. Once you are discharged, your primary care physician will handle any further medical issues. Please note that NO REFILLS for any discharge medications will be authorized once you are discharged, as it is imperative that you return to your primary care physician (or establish a relationship with a primary care physician if you do not have one) for your post hospital discharge needs so that they can reassess your need for medications and monitor your lab values.   Check CBGs before meals and at bedtime.   Increase activity slowly   Complete by: As directed    No dressing needed   Complete by: As directed       Allergies as of 05/05/2021       Reactions   Atorvastatin Other (See Comments)   Arthralgia, myalgia   Fluvastatin Other (See Comments)   Arthralgia, myalgias   Lisinopril Cough   Metformin  And Related Other (See Comments)   Anorexia, diarrhea, nausea, weight loss   Pravastatin Other (See Comments)   Arthralgia, myalgias   Simvastatin Other (See Comments)   myalgias   Spiriva Handihaler [tiotropium Bromide Monohydrate] Nausea Only        Medication List     STOP taking these medications    Bayer Contour Test test strip Generic drug: glucose blood   glipiZIDE 5 MG 24 hr tablet Commonly known as: GLUCOTROL XL   Januvia 50 MG tablet Generic drug: sitaGLIPtin Replaced by: linagliptin 5 MG Tabs tablet       TAKE these medications    Advair Diskus 250-50 MCG/ACT Aepb Generic drug: fluticasone-salmeterol Inhale 1 puff into the lungs 2 (two) times daily.   albuterol 108 (90 Base) MCG/ACT inhaler Commonly known as: VENTOLIN HFA Inhale into the lungs every 6 (six) hours as needed for wheezing or shortness of breath.   apixaban 2.5 MG Tabs tablet Commonly known as: ELIQUIS Take 1 tablet (2.5 mg total) by mouth 2 (two) times daily.   balsalazide 750 MG capsule Commonly known as: COLAZAL TAKE 2 CAPSULES BY MOUTH TWICE DAILY What changed:  how much to take how to take this when to take this additional instructions  cyanocobalamin 1000 MCG tablet Take 1,000 mcg by mouth in the morning and at bedtime.   diltiazem 180 MG 24 hr capsule Commonly known as: CARDIZEM CD Take 1 capsule (180 mg total) by mouth daily.   Ensure Max Protein Liqd Take 330 mLs (11 oz total) by mouth at bedtime.   feeding supplement (GLUCERNA SHAKE) Liqd Take 237 mLs by mouth 2 (two) times daily between meals.   ezetimibe 10 MG tablet Commonly known as: ZETIA Take 10 mg by mouth daily.   finasteride 5 MG tablet Commonly known as: PROSCAR Take 5 mg by mouth daily.   furosemide 20 MG tablet Commonly known as: LASIX Take 20 mg by mouth daily.   insulin aspart 100 UNIT/ML injection Commonly known as: novoLOG Inject 6 Units into the skin 3 (three) times daily with meals.    insulin aspart 100 UNIT/ML injection Commonly known as: novoLOG Inject 0-9 Units into the skin 3 (three) times daily with meals. 0-9 Units, Subcutaneous, 3 times daily with meals,  CBG < 70: Implement Hypoglycemia measuresCBG 70 - 120: 0 units CBG 121 - 150: 1 unit CBG 151 - 200: 2 units CBG 201 - 250: 3 units CBG 251 - 300: 5 units CBG 301 - 350: 7 units CBG 351 - 400: 9 units CBG > 400: call MD   insulin detemir 100 UNIT/ML injection Commonly known as: LEVEMIR Inject 0.15 mLs (15 Units total) into the skin at bedtime.   insulin detemir 100 UNIT/ML injection Commonly known as: LEVEMIR Inject 0.38 mLs (38 Units total) into the skin every morning. Start taking on: May 06, 2021   levothyroxine 50 MCG tablet Commonly known as: SYNTHROID Take 50 mcg by mouth daily.   linagliptin 5 MG Tabs tablet Commonly known as: TRADJENTA Take 1 tablet (5 mg total) by mouth daily. Start taking on: May 06, 2021 Replaces: Januvia 50 MG tablet   melatonin 5 MG Tabs Take 1 tablet (5 mg total) by mouth at bedtime.   oxybutynin 5 MG tablet Commonly known as: DITROPAN Take 5 mg by mouth daily.   pantoprazole 40 MG tablet Commonly known as: PROTONIX Take 1 tablet by mouth 2 (two) times daily.   QUEtiapine 25 MG tablet Commonly known as: SEROQUEL Take 1 tablet (25 mg total) by mouth at bedtime.   rosuvastatin 40 MG tablet Commonly known as: CRESTOR Take 1 tablet by mouth daily.   Vitamin D3 50 MCG (2000 UT) capsule Take 2,000 Units by mouth in the morning and at bedtime.               Durable Medical Equipment  (From admission, onward)           Start     Ordered   04/29/21 0821  For home use only DME Walker rolling  Once       Question Answer Comment  Walker: With [employer] Wheels   Patient needs a walker to treat with the following condition Weakness      04/29/21 0820              Discharge Care Instructions  (From admission, onward)           Start      Ordered   05/05/21 0000  No dressing needed        05/05/21 0957            Follow-up Information     [facility] Follow up.   Specialty: [place]  Why: They will call to schedule an apt. Contact information: [address] [place] [address] [phone]                Allergies  Allergen Reactions   Atorvastatin Other (See Comments)    Arthralgia, myalgia   Fluvastatin Other (See Comments)    Arthralgia, myalgias   Lisinopril Cough   Metformin And Related Other (See Comments)    Anorexia, diarrhea, nausea, weight loss   Pravastatin Other (See Comments)    Arthralgia, myalgias   Simvastatin Other (See Comments)    myalgias   Spiriva Handihaler [Tiotropium Bromide Monohydrate] Nausea Only      Consultations: None   Other Procedures/Studies: DG Chest 2 View  Result Date: 04/26/2021 CLINICAL DATA:  80 year old male with cough for 3 weeks and congestion. Positive for COVID-19 last month. EXAM: CHEST - 2 VIEW COMPARISON:  03/10/2021 and earlier. FINDINGS: AP and lateral views. Stable chronically large lung volumes. Stable cardiac size and mediastinal contours. Prior CABG. Visualized tracheal air column is within normal limits. No pneumothorax. No pulmonary edema or pleural effusion. Patchy and nodular bibasilar pulmonary opacity is new since August. No consolidation. Stable visualized osseous structures. Negative visible bowel gas. IMPRESSION: New patchy and nodular bibasilar pulmonary opacity since August, compatible with bilateral pneumonia. Aspiration also a consideration. No pleural effusion. Underlying chronic pulmonary hyperinflation. Electronically Signed   By: Genevie Ann M.D.   On: 04/26/2021 04:46   DG Chest Port 1 View  Result Date: 05/01/2021 CLINICAL DATA:  Short of breath.  Recent COVID.  Cough and weakness EXAM: PORTABLE CHEST 1 VIEW COMPARISON:  04/26/2021 FINDINGS: Postop CABG. Cardiac enlargement with vascular  congestion. No edema or effusion Bibasilar airspace disease shows mild interval improvement. IMPRESSION: Improvement in bibasilar atelectasis/infiltrate Pulmonary vascular congestion. Electronically Signed   By: Franchot Gallo M.D.   On: 05/01/2021 08:23   DG Swallowing Func-Speech Pathology  Result Date: 05/01/2021 Table formatting from the original result was not included. Objective Swallowing Evaluation: Type of Study: MBS-Modified Barium Swallow Study  Patient Details Name: CALIBER LANDESS MRN: [id number] Date of Birth: 01-31-1941 Today's Date: 05/01/2021 Time: SLP Start Time (ACUTE ONLY): 1400 -SLP Stop Time (ACUTE ONLY): 1420 SLP Time Calculation (min) (ACUTE ONLY): 20 min Past Medical History: Past Medical History: Diagnosis Date  Allergy   Anxiety   Asthma   since 2013  Barrett's esophagus 03/09/2014  Bilateral lumbar radiculopathy   since 2013  CAD S/P percutaneous coronary angioplasty 1987  a. 1987 angioplasty and BMS- Cx; b. 1997 CABG x 4;  c. MYOVIEW 1/'05: ? Ischemia vs. artifact --> sent for cath - patent grafts.  Chronic atrial fibrillation ([place])   a. Prev on amiodarone-->d/c 08/2016 in setting of recurrent AF, LFT abnormalities, and pulmonary symptoms;  b. CHA2DS2VASc = 4-->eliquis.  Chronic bronchitis ([place])   since 2007  COPD (chronic obstructive pulmonary disease) (HCC)   DDD (degenerative disc disease)   Diverticulosis   Dyslipidemia, goal LDL below 70   Doing better off of Zocor, currently on Lescol   ED (erectile dysfunction)   Elevated LFTs   Esophageal reflux   GERD (gastroesophageal reflux disease)   Gout, unspecified   Hearing loss   Helicobacter pylori gastritis 07/2009  Herpes zoster 2009  Hypothyroidism   since 2010  Iron deficiency anemia, unspecified   Leg weakness   a. 09/2016 ABI's: R 1.05, L 0.98.  Other and unspecified hyperlipidemia   Other B-complex deficiencies   Personal history of colonic  polyps   Prostatitis   chronic ulcerative  Pulmonary fibrosis (HCC)   and bronchiectasis,  since 2015  Renal stone   Right  S/P CABG x 4 1997  a) LIMA-LAD, SVG to OM, SVG-dRCA-RPL; b) FALSE + MYOVIEW --> CATH 1/'05: 100% LAD after widelly patent D1, all Cx OM branches 100%, pRCA 100%, SVG-PDA patent w/ retrograde filling of RPL, SVG-OM widely patent ~ normal OM, LIMA-LAD patent.  Spinal stenosis   ST elevation myocardial infarction (STEMI) of inferior wall, subsequent episode of care ([place]) 1987, 1997  a) PTCA of Cx; b) CABG   Type II or unspecified type diabetes mellitus without mention of complication, not stated as uncontrolled   no meds  Ulcerative colitis, unspecified   Unspecified essential hypertension   Vitamin D deficiency  Past Surgical History: Past Surgical History: Procedure Laterality Date  BLEPHAROPLASTY Bilateral   for ptosis  CARDIAC CATHETERIZATION  08/17/2003  "False positive stress test " grafts patent; RCA proximal 100% LAD 100% occlusion after normal D1 with 80% ostial as SP1; circumflex patent but OM1 on to all occluded; EF 50-55%  CARDIAC CATHETERIZATION  11/1995  Preop CABG: LAD 90% at D1, circumflex 100 and OM a 90% proximal, 80% distal  Carotid Duplex Doppler  12/23/2009  Right&Left ICAs 0-49%, mildly abnormal study,   CHOLECYSTECTOMY    COLONOSCOPY    CORONARY ARTERY BYPASS GRAFT  11/1995  LIMA-LAD, SVG to OM, SVG-dRCA-RPL  ESOPHAGOGASTRODUODENOSCOPY    FLEXIBLE SIGMOIDOSCOPY    NM MYOCAR PERF EJECTION FRACTION  07/03/2003  FALSE POSITIVE TEST; Bruce protocol, negative test with scintigraphic evidence of inferoapical scar, diaphragmatic attenuation, moderate ischemia.  NM MYOVIEW LTD  2005  Questionable ischemia that is likely either infarct versus artifact; normal  TRANSTHORACIC ECHOCARDIOGRAM  12/2017  EF 55-60%.  Unable to assess diastolic function due to A. fib.  Moderate LA/RA dilation.  Mild MR. HPI: Pt is an 80 y.o. male who presented with productive cough and SOB. Pt's wife reported on admission that pt has mild dementia with memory problems but never had aspiration before  and no choking or cough after eating regular food. CXR 10/8: New patchy and nodular bibasilar pulmonary opacity since August,  compatible with bilateral pneumonia. Aspiration also a  consideration.  PMH: HTN, chronic A. fib, CAD s/p CABG 1997, IIDM, asthma/COPD, GERD/Barrett esophagus, hypothyroidism, CKD stage II, positive COVID-19 test on 04/03/21.  No data recorded Assessment / Plan / Recommendation CHL IP CLINICAL IMPRESSIONS 05/01/2021 Clinical Impression Pt presents with pharyngeal dysphagia characterized by reduced pharyngeal constriction, and reduced anterior laryngeal movement which resulted in reduced epiglottic inversion and reduced airway protection. He demonstrated posterior pharyngeal wall residue, pyriform sinus residue, and penetration (PAS 5) as well as aspiration (PAS 7,8) with thin and nectar thick liquids. Delayed coughing was inconsistently noted following instances of aspiration, but this was ineffective. Laryngeal invasion was noted with honey thick liquids via straw, but not with cup. Immediate laryngeal invasion was improved to penetration (PAS 3) with smaller boluses of nectar thick liquids via cup and prompted coughing was able to expel the penetrate. However, pt demonstrated difficulty consistently demonstrating this. A chin tuck posture increased aspiration of pyriform sinus residue. A dysphagia 3 diet with honey thick liquids is recommended at this time. SLP will follow for dysphagia treatment. SLP Visit Diagnosis Dysphagia, oropharyngeal phase (R13.12) Attention and concentration deficit following -- Frontal lobe and executive function deficit following -- Impact on safety and function Moderate aspiration risk;Severe aspiration risk   CHL IP TREATMENT  RECOMMENDATION 05/01/2021 Treatment Recommendations Therapy as outlined in treatment plan below   Prognosis 05/01/2021 Prognosis for Safe Diet Advancement Fair Barriers to Reach Goals Cognitive deficits;Severity of deficits  Barriers/Prognosis Comment -- CHL IP DIET RECOMMENDATION 05/01/2021 SLP Diet Recommendations Dysphagia 3 (Mech soft) solids;Honey thick liquids Liquid Administration via Cup;No straw Medication Administration Whole meds with puree Compensations Slow rate;Small sips/bites;Follow solids with liquid Postural Changes Seated upright at 90 degrees   CHL IP OTHER RECOMMENDATIONS 05/01/2021 Recommended Consults -- Oral Care Recommendations Oral care BID Other Recommendations --   CH[facility] IP FOLLOW UP RECOMMENDATIONS 05/01/2021 Follow up Recommendations Skilled Nursing facility   [facility] IP FREQUENCY AND DURATION 05/01/2021 Speech Therapy Frequency (ACUTE ONLY) min 2x/week Treatment Duration 2 weeks      CHL IP ORAL PHASE 05/01/2021 Oral Phase Impaired Oral - Pudding Teaspoon -- Oral - Pudding Cup -- Oral - Honey Teaspoon -- Oral - Honey Cup -- Oral - Nectar Teaspoon -- Oral - Nectar Cup -- Oral - Nectar Straw -- Oral - Thin Teaspoon -- Oral - Thin Cup WFL Oral - Thin Straw WFL Oral - Puree WFL Oral - Mech Soft -- Oral - Regular Impaired mastication Oral - Multi-Consistency -- Oral - Pill -- Oral Phase - Comment --  CHL IP PHARYNGEAL PHASE 05/01/2021 Pharyngeal Phase Impaired Pharyngeal- Pudding Teaspoon -- Pharyngeal -- Pharyngeal- Pudding Cup -- Pharyngeal -- Pharyngeal- Honey Teaspoon -- Pharyngeal -- Pharyngeal- Honey Cup Reduced anterior laryngeal mobility;Reduced airway/laryngeal closure;Pharyngeal residue - pyriform;Pharyngeal residue - valleculae;Pharyngeal residue - posterior pharnyx;Reduced pharyngeal peristalsis Pharyngeal -- Pharyngeal- Nectar Teaspoon -- Pharyngeal -- Pharyngeal- Nectar Cup Reduced anterior laryngeal mobility;Reduced airway/laryngeal closure;Pharyngeal residue - pyriform;Pharyngeal residue - valleculae;Pharyngeal residue - posterior pharnyx;Reduced pharyngeal peristalsis;Penetration/Aspiration during swallow;Penetration/Apiration after swallow Pharyngeal Material enters airway, CONTACTS cords and not  ejected out;Material enters airway, passes BELOW cords and not ejected out despite cough attempt by patient;Material enters airway, remains ABOVE vocal cords and not ejected out Pharyngeal- Nectar Straw Reduced anterior laryngeal mobility;Reduced airway/laryngeal closure;Pharyngeal residue - pyriform;Pharyngeal residue - valleculae;Pharyngeal residue - posterior pharnyx;Reduced pharyngeal peristalsis;Penetration/Aspiration during swallow;Penetration/Apiration after swallow Pharyngeal Material enters airway, CONTACTS cords and not ejected out;Material enters airway, passes BELOW cords and not ejected out despite cough attempt by patient;Material enters airway, passes BELOW cords without attempt by patient to eject out (silent aspiration) Pharyngeal- Thin Teaspoon NT Pharyngeal -- Pharyngeal- Thin Cup Reduced anterior laryngeal mobility;Reduced airway/laryngeal closure;Pharyngeal residue - pyriform;Pharyngeal residue - valleculae;Pharyngeal residue - posterior pharnyx;Reduced pharyngeal peristalsis;Penetration/Aspiration during swallow;Penetration/Apiration after swallow Pharyngeal Material enters airway, CONTACTS cords and not ejected out;Material enters airway, passes BELOW cords and not ejected out despite cough attempt by patient;Material enters airway, passes BELOW cords without attempt by patient to eject out (silent aspiration) Pharyngeal- Thin Straw -- Pharyngeal -- Pharyngeal- Puree Reduced anterior laryngeal mobility;Reduced airway/laryngeal closure;Pharyngeal residue - pyriform;Pharyngeal residue - valleculae;Pharyngeal residue - posterior pharnyx;Reduced pharyngeal peristalsis Pharyngeal -- Pharyngeal- Mechanical Soft Reduced anterior laryngeal mobility;Reduced airway/laryngeal closure;Pharyngeal residue - pyriform;Pharyngeal residue - valleculae;Pharyngeal residue - posterior pharnyx;Reduced pharyngeal peristalsis Pharyngeal -- Pharyngeal- Regular Reduced anterior laryngeal mobility;Reduced  airway/laryngeal closure;Pharyngeal residue - pyriform;Pharyngeal residue - valleculae;Pharyngeal residue - posterior pharnyx;Reduced pharyngeal peristalsis Pharyngeal -- Pharyngeal- Multi-consistency -- Pharyngeal -- Pharyngeal- Pill -- Pharyngeal -- Pharyngeal Comment --  CHL IP CERVICAL ESOPHAGEAL PHASE 05/01/2021 Cervical Esophageal Phase WFL Pudding Teaspoon -- Pudding Cup -- Honey Teaspoon -- Honey Cup -- Nectar Teaspoon -- Nectar Cup -- Nectar Straw -- Thin Teaspoon -- Thin Cup -- Thin Straw -- Puree -- Mechanical Soft -- Regular -- Multi-consistency --  Pill -- Cervical Esophageal Comment -- Tobie Poet I. Hardin Negus, [place], [place] Office number [phone] Pager [phone] Horton Marshall 05/01/2021, 4:18 PM              ECHOCARDIOGRAM COMPLETE  Result Date: 05/01/2021    ECHOCARDIOGRAM REPORT   Patient Name:   Timothy Mclean Date of Exam: 05/01/2021 Medical Rec #:  [id number]      Height:       72.0 in Accession #:    [id number]     Weight:       162.9 lb Date of Birth:  April 19, 1941       BSA:          1.953 m Patient Age:    54 years       BP:           111/85 mmHg Patient Gender: M              HR:           101 bpm. Exam Location:  Inpatient Procedure: 2D Echo, Cardiac Doppler and Color Doppler Indications:     Acute Respiratory Distress  History:         Patient has prior history of Echocardiogram examinations, most                  recent 12/04/2018. Risk Factors:Hypertension and Family History                  of Coronary Artery Disease.  Sonographer:     Helmut Muster Referring Phys:  [place] Diagnosing Phys: Kirk Ruths MD IMPRESSIONS  1. Left ventricular ejection fraction, by estimation, is 60 to 65%. The left ventricle has normal function. The left ventricle has no regional wall motion abnormalities. Left ventricular diastolic function could not be evaluated.  2. Right ventricular systolic function is normal. The right ventricular size is normal.   3. Left atrial size was moderately dilated.  4. Right atrial size was mildly dilated.  5. The mitral valve is normal in structure. No evidence of mitral valve regurgitation. No evidence of mitral stenosis.  6. The aortic valve has an indeterminant number of cusps. Aortic valve regurgitation is not visualized. No aortic stenosis is present.  7. Aortic dilatation noted. There is borderline dilatation of the aortic root, measuring 38 mm.  8. The inferior vena cava is normal in size with greater than 50% respiratory variability, suggesting right atrial pressure of 3 mmHg. FINDINGS  Left Ventricle: Left ventricular ejection fraction, by estimation, is 60 to 65%. The left ventricle has normal function. The left ventricle has no regional wall motion abnormalities. Definity contrast agent was given IV to delineate the left ventricular  endocardial borders. The left ventricular internal cavity size was normal in size. There is no left ventricular hypertrophy. Left ventricular diastolic function could not be evaluated due to atrial fibrillation. Left ventricular diastolic function could  not be evaluated. Right Ventricle: The right ventricular size is normal. Right ventricular systolic function is normal. Left Atrium: Left atrial size was moderately dilated. Right Atrium: Right atrial size was mildly dilated. Pericardium: There is no evidence of pericardial effusion. Mitral Valve: The mitral valve is normal in structure. No evidence of mitral valve regurgitation. No evidence of mitral valve stenosis. Tricuspid Valve: The tricuspid valve is normal in structure. Tricuspid valve regurgitation is trivial. No evidence of tricuspid stenosis. Aortic Valve: The aortic valve has an indeterminant number of cusps. Aortic valve  regurgitation is not visualized. No aortic stenosis is present. Pulmonic Valve: The pulmonic valve was not well visualized. Pulmonic valve regurgitation is not visualized. No evidence of pulmonic stenosis. Aorta:  Aortic dilatation noted. There is borderline dilatation of the aortic root, measuring 38 mm. Venous: The inferior vena cava is normal in size with greater than 50% respiratory variability, suggesting right atrial pressure of 3 mmHg. IAS/Shunts: No atrial level shunt detected by color flow Doppler.  LEFT VENTRICLE PLAX 2D LVIDd:         4.00 cm      Diastology LVIDs:         3.10 cm      LV e' medial:  6.64 cm/s LV PW:         1.00 cm      LV e' lateral: 8.27 cm/s LV IVS:        1.00 cm LVOT diam:     2.40 cm LV SV:         79 LV SV Index:   40 LVOT Area:     4.52 cm  LV Volumes (MOD) LV vol d, MOD A2C: 96.4 ml LV vol d, MOD A4C: 102.7 ml LV vol s, MOD A2C: 50.4 ml LV vol s, MOD A4C: 54.3 ml LV SV MOD A2C:     46.0 ml LV SV MOD A4C:     102.7 ml LV SV MOD BP:      47.3 ml RIGHT VENTRICLE             IVC RV S prime:     13.30 cm/s  IVC diam: 2.30 cm TAPSE (M-mode): 2.1 cm LEFT ATRIUM             Index        RIGHT ATRIUM           Index LA diam:        3.70 cm 1.89 cm/m   RA Area:     22.30 cm LA Vol (A2C):   75.6 ml 38.71 ml/m  RA Volume:   68.70 ml  35.18 ml/m LA Vol (A4C):   77.5 ml 39.69 ml/m LA Biplane Vol: 78.0 ml 39.94 ml/m  AORTIC VALVE LVOT Vmax:   99.90 cm/s LVOT Vmean:  67.000 cm/s LVOT VTI:    0.174 m  AORTA Ao Root diam: 3.80 cm Ao Asc diam:  3.95 cm TRICUSPID VALVE TR Peak grad:   16.3 mmHg TR Vmax:        202.00 cm/s  SHUNTS Systemic VTI:  0.17 m Systemic Diam: 2.40 cm Kirk Ruths MD Electronically signed by Kirk Ruths MD Signature Date/Time: 05/01/2021/2:32:14 PM    Final      TODAY-DAY OF DISCHARGE:  Subjective:   Timothy Mclean today has no headache,no chest abdominal pain,nonew weakness tingling or numbness, feels much better wants to go home today.  Objective:   Blood pressure 108/62, pulse 75, temperature (!) 97.5 F (36.4 C), temperature source Axillary, resp. rate 19, height 6' (1.829 m), weight 73.9 kg, SpO2 93 %. No intake or output data in the 24 hours ending 05/05/21  0957 Filed Weights   04/27/21 1519 04/28/21 0350 05/03/21 0636  Weight: 74.2 kg 73.9 kg 73.9 kg    Exam: Awake Alert, Oriented *3, No new F.N deficits, Normal affect [place].AT,PERRAL Supple Neck,No JVD, No cervical lymphadenopathy appriciated.  Symmetrical Chest wall movement, Good air movement bilaterally, CTAB RRR,No Gallops,Rubs or new Murmurs, No Parasternal Heave +ve B.Sounds, Abd Soft, Non tender, No  organomegaly appriciated, No rebound -guarding or rigidity. No Cyanosis, Clubbing or edema, No new Rash or bruise   PERTINENT RADIOLOGIC STUDIES: No results found.   PERTINENT LAB RESULTS: CBC: Recent Labs    05/04/21 0130  WBC 13.4*  HGB 13.9  HCT 41.9  PLT 178   CMET CMP     Component Value Date/Time   NA 138 05/04/2021 0130   NA 145 (H) 08/21/2019 1416   K 3.7 05/04/2021 0130   CL 105 05/04/2021 0130   CO2 25 05/04/2021 0130   GLUCOSE 152 (H) 05/04/2021 0130   BUN 36 (H) 05/04/2021 0130   BUN 26 08/21/2019 1416   CREATININE 1.31 (H) 05/04/2021 0130   CREATININE 1.86 (H) 10/29/2016 1125   CALCIUM 8.8 (L) 05/04/2021 0130   PROT 5.5 (L) 05/01/2021 0141   PROT 7.3 08/21/2019 1416   ALBUMIN 2.7 (L) 05/01/2021 0141   ALBUMIN 5.1 (H) 08/21/2019 1416   AST 116 (H) 05/01/2021 0141   ALT 55 (H) 05/01/2021 0141   ALKPHOS 85 05/01/2021 0141   BILITOT 0.5 05/01/2021 0141   BILITOT 0.5 08/21/2019 1416   GFRNONAA 54 (L) 05/04/2021 0130   GFRAA 38 (L) 08/21/2019 1416    GFR Estimated Creatinine Clearance: 45.4 mL/min (A) (by C-G formula based on SCr of 1.31 mg/dL (H)). No results for input(s): LIPASE, AMYLASE in the last 72 hours. No results for input(s): CKTOTAL, CKMB, CKMBINDEX, TROPONINI in the last 72 hours. Invalid input(s): POCBNP No results for input(s): DDIMER in the last 72 hours. No results for input(s): HGBA1C in the last 72 hours. No results for input(s): CHOL, HDL, LDLCALC, TRIG, CHOLHDL, LDLDIRECT in the last 72 hours. No results for input(s): TSH,  T4TOTAL, T3FREE, THYROIDAB in the last 72 hours.  Invalid input(s): FREET3 No results for input(s): VITAMINB12, FOLATE, FERRITIN, TIBC, IRON, RETICCTPCT in the last 72 hours. Coags: No results for input(s): INR in the last 72 hours.  Invalid input(s): PT Microbiology: Recent Results (from the past 240 hour(s))  Resp Panel by RT-PCR (Flu A&B, Covid) Nasopharyngeal Swab     Status: Abnormal   Collection Time: 04/26/21  3:43 AM   Specimen: Nasopharyngeal Swab; Nasopharyngeal(NP) swabs in vial transport medium  Result Value Ref Range Status   SARS Coronavirus 2 by RT PCR POSITIVE (A) NEGATIVE Final    Comment: RESULT CALLED TO, READ BACK BY AND VERIFIED WITH: RN BRITTANY O. 04/26/21@4 :50 BY TW (NOTE) SARS-CoV-2 target nucleic acids are DETECTED.  The SARS-CoV-2 RNA is generally detectable in upper respiratory specimens during the acute phase of infection. Positive results are indicative of the presence of the identified virus, but do not rule out bacterial infection or co-infection with other pathogens not detected by the test. Clinical correlation with patient history and other diagnostic information is necessary to determine patient infection status. The expected result is Negative.  Fact Sheet for Patients: EntrepreneurPulse.com.au  Fact Sheet for Healthcare Providers: IncredibleEmployment.be  This test is not yet approved or cleared by the [place] FDA and  has been authorized for detection and/or diagnosis of SARS-CoV-2 by FDA under an Emergency Use Authorization (EUA).  This EUA will remain in effect (meaning this test can b e used) for the duration of  the COVID-19 declaration under Section 564(b)(1) of the Act, 21 U.S.C. section 360bbb-3(b)(1), unless the authorization is terminated or revoked sooner.     Influenza A by PCR NEGATIVE NEGATIVE Final   Influenza B by PCR NEGATIVE NEGATIVE Final    Comment: (NOTE)  The Xpert Xpress  SARS-CoV-2/FLU/RSV plus assay is intended as an aid in the diagnosis of influenza from Nasopharyngeal swab specimens and should not be used as a sole basis for treatment. Nasal washings and aspirates are unacceptable for Xpert Xpress SARS-CoV-2/FLU/RSV testing.  Fact Sheet for Patients: EntrepreneurPulse.com.au  Fact Sheet for Healthcare Providers: IncredibleEmployment.be  This test is not yet approved or cleared by the [place] FDA and has been authorized for detection and/or diagnosis of SARS-CoV-2 by FDA under an Emergency Use Authorization (EUA). This EUA will remain in effect (meaning this test can be used) for the duration of the COVID-19 declaration under Section 564(b)(1) of the Act, 21 U.S.C. section 360bbb-3(b)(1), unless the authorization is terminated or revoked.  Performed at [facility], [place] [address] [place], [place] [address]   Culture, blood (routine x 2) Call MD if unable to obtain prior to antibiotics being given     Status: None   Collection Time: 04/26/21  3:15 PM   Specimen: BLOOD  Result Value Ref Range Status   Specimen Description BLOOD RIGHT ANTECUBITAL  Final   Special Requests   Final    BOTTLES DRAWN AEROBIC AND ANAEROBIC Blood Culture adequate volume   Culture   Final    NO GROWTH 5 DAYS Performed at [facility], [place] [address] [place], [place] [address]    Report Status 05/01/2021 FINAL  Final  Culture, blood (routine x 2) Call MD if unable to obtain prior to antibiotics being given     Status: None   Collection Time: 04/26/21  3:57 PM   Specimen: BLOOD LEFT HAND  Result Value Ref Range Status   Specimen Description BLOOD LEFT HAND  Final   Special Requests   Final    BOTTLES DRAWN AEROBIC AND ANAEROBIC Blood Culture adequate volume   Culture   Final    NO GROWTH 5 DAYS Performed at [facility], [place] [address] [place], [place] [address]    Report Status 05/01/2021 FINAL  Final   Expectorated Sputum Assessment w Gram Stain, Rflx to Resp Cult     Status: None   Collection Time: 04/28/21  8:24 PM   Specimen: SPU  Result Value Ref Range Status   Specimen Description SPUTUM  Final   Special Requests NONE  Final   Sputum evaluation   Final    THIS SPECIMEN IS ACCEPTABLE FOR SPUTUM CULTURE Performed at [facility], [place] [address], [place], [place] [address]    Report Status 04/30/2021 FINAL  Final  Culture, Respiratory w Gram Stain     Status: None   Collection Time: 04/28/21  8:24 PM   Specimen: SPU  Result Value Ref Range Status   Specimen Description SPUTUM  Final   Special Requests NONE Reflexed from [id number]  Final   Gram Stain   Final    FEW SQUAMOUS EPITHELIAL CELLS PRESENT MODERATE WBC SEEN NO ORGANISMS SEEN Performed at [facility], [place] [address] [place], [place] [address]    Culture FEW CANDIDA ALBICANS  Final   Report Status 05/01/2021 FINAL  Final    FURTHER DISCHARGE INSTRUCTIONS:  Get Medicines reviewed and adjusted: Please take all your medications with you for your next visit with your Primary MD  Laboratory/radiological data: Please request your Primary MD to go over all hospital tests and procedure/radiological results at the follow up, please ask your Primary MD to get all Hospital records sent to his/her office.  In  some cases, they will be blood work, cultures and biopsy results pending at the time of your discharge. Please request that your primary care M.D. goes through all the records of your hospital data and follows up on these results.  Also Note the following: If you experience worsening of your admission symptoms, develop shortness of breath, life threatening emergency, suicidal or homicidal thoughts you must seek medical attention immediately by calling 911 or calling your MD immediately  if symptoms less severe.  You must read complete instructions/literature along with all the possible adverse reactions/side  effects for all the Medicines you take and that have been prescribed to you. Take any new Medicines after you have completely understood and accpet all the possible adverse reactions/side effects.   Do not drive when taking Pain medications or sleeping medications (Benzodaizepines)  Do not take more than prescribed Pain, Sleep and Anxiety Medications. It is not advisable to combine anxiety,sleep and pain medications without talking with your primary care practitioner  Special Instructions: If you have smoked or chewed Tobacco  in the last 2 yrs please stop smoking, stop any regular Alcohol  and or any Recreational drug use.  Wear Seat belts while driving.  Please note: You were cared for by a hospitalist during your hospital stay. Once you are discharged, your primary care physician will handle any further medical issues. Please note that NO REFILLS for any discharge medications will be authorized once you are discharged, as it is imperative that you return to your primary care physician (or establish a relationship with a primary care physician if you do not have one) for your post hospital discharge needs so that they can reassess your need for medications and monitor your lab values.  Total Time spent coordinating discharge including counseling, education and face to face time equals 35 minutes.  SignedOren Binet 05/05/2021 9:57 AM

## 2021-05-05 NOTE — TOC Progression Note (Signed)
Transition of Care [facility]) - Progression Note    Patient Details  Name: Timothy Mclean MRN: [id number] Date of Birth: 08-22-1940  Transition of Care [facility]) CM/SW [place], LCSW Phone Number: 05/05/2021, 9:51 AM  Clinical Narrative:    [facility] ready to accept patient today. CSW spoke with patient's spouse and she will send her son to complete paperwork there at 11:30am. CSW will then schedule transport.    Expected Discharge Plan: Skilled Nursing Facility Barriers to Discharge: Insurance Authorization  Expected Discharge Plan and Services Expected Discharge Plan: [place] In-house Referral: Clinical Social Work Discharge Planning Services: CM Consult Post Acute Care Choice: [place] Living arrangements for the past 2 months: [place]                 DME Arranged: Walker rolling DME Agency: [employer] Date DME Agency Contacted: 04/29/21 Time DME Agency Contacted: 1100 Representative spoke with at DME Agency: Freda Munro HH Arranged: PT, OT [facility] Agency: [place] Date [place]: 04/30/21 Time [place]: 1332 Representative spoke with at [place]: Cindie   Social Determinants of Health ([place]) Interventions    Readmission Risk Interventions No flowsheet data found.

## 2021-05-05 NOTE — Plan of Care (Signed)
  Problem: Clinical Measurements: Goal: Will remain free from infection Outcome: Adequate for Discharge Goal: Respiratory complications will improve Outcome: Adequate for Discharge   Problem: Activity: Goal: Risk for activity intolerance will decrease Outcome: Adequate for Discharge   Problem: Safety: Goal: Ability to remain free from injury will improve Outcome: Adequate for Discharge

## 2021-05-05 NOTE — TOC Transition Note (Addendum)
Transition of Care [facility]) - CM/SW Discharge Note   Patient Details  Name: Timothy Mclean MRN: [id number] Date of Birth: [birth year]  Transition of Care [facility]) CM/SW Contact:  Benard Halsted, LCSW Phone Number: 05/05/2021, 10:54 AM   Clinical Narrative:    Patient will DC to: [facility] Anticipated DC date: 05/05/21 Family notified: Spouse, English as a second language teacher by: Enzo Bi # [phone])   Per MD patient ready for DC to [facility]. RN to call report prior to discharge [phone] room 3221). RN, patient, patient's family, and facility notified of DC. Discharge Summary and FL2 sent to facility. DC packet on chart. Ambulance transport requested for patient.   CSW will sign off for now as social work intervention is no longer needed. Please consult [place] again if new needs arise.     Final next level of care: Skilled Nursing Facility Barriers to Discharge: Barriers Resolved   Patient Goals and CMS Choice Patient states their goals for this hospitalization and ongoing recovery are:: Rehab CMS Medicare.gov Compare Post Acute Care list provided to:: Patient Represenative (must comment) Choice offered to / list presented to : Spouse  Discharge Placement   Existing PASRR number confirmed : 05/05/21          Patient [facility] bed at: [facility] Patient to be transferred to facility by: [place] Name of family member notified: Spouse, Hoyle Sauer Patient and family notified of of transfer: 05/05/21  Discharge Plan and Services In-house Referral: Clinical Social Work Discharge Planning Services: CM Consult Post Acute Care Choice: [place]          DME Arranged: Walker rolling DME Agency: [employer] Date DME Agency Contacted: 04/29/21 Time DME Agency Contacted: [phone] Representative spoke with at DME Agency: Freda Munro HH Arranged: PT, OT [facility] Agency: [place] Date [place]: 04/30/21 Time [place]: [phone] Representative spoke with  at [place]: Cindie  Social Determinants of Health ([place]) Interventions     Readmission Risk Interventions No flowsheet data found.

## 2021-05-05 NOTE — Plan of Care (Signed)
  Problem: Clinical Measurements: Goal: Will remain free from infection 05/05/2021 1503 by Sindy Messing, RN Outcome: Adequate for Discharge 05/05/2021 1502 by Sindy Messing, RN Outcome: Adequate for Discharge Goal: Respiratory complications will improve 05/05/2021 1503 by Sindy Messing, RN Outcome: Adequate for Discharge 05/05/2021 1502 by Sindy Messing, RN Outcome: Adequate for Discharge   Problem: Activity: Goal: Risk for activity intolerance will decrease 05/05/2021 1503 by Sindy Messing, RN Outcome: Adequate for Discharge 05/05/2021 1502 by Sindy Messing, RN Outcome: Adequate for Discharge   Problem: Safety: Goal: Ability to remain free from injury will improve 05/05/2021 1503 by Sindy Messing, RN Outcome: Adequate for Discharge 05/05/2021 1502 by Sindy Messing, RN Outcome: Adequate for Discharge

## 2021-05-05 NOTE — Progress Notes (Signed)
1045: report given to Nunzio Cory, Therapist, sports at [employer].  Time for questions and clarification given.

## 2021-05-06 DIAGNOSIS — K5180 Other ulcerative colitis without complications: Secondary | ICD-10-CM | POA: Diagnosis not present

## 2021-05-06 DIAGNOSIS — E785 Hyperlipidemia, unspecified: Secondary | ICD-10-CM | POA: Diagnosis not present

## 2021-05-06 DIAGNOSIS — J9690 Respiratory failure, unspecified, unspecified whether with hypoxia or hypercapnia: Secondary | ICD-10-CM | POA: Diagnosis not present

## 2021-05-06 DIAGNOSIS — I25709 Atherosclerosis of coronary artery bypass graft(s), unspecified, with unspecified angina pectoris: Secondary | ICD-10-CM | POA: Diagnosis not present

## 2021-05-06 DIAGNOSIS — N1831 Chronic kidney disease, stage 3a: Secondary | ICD-10-CM | POA: Diagnosis not present

## 2021-05-06 DIAGNOSIS — I4891 Unspecified atrial fibrillation: Secondary | ICD-10-CM | POA: Diagnosis not present

## 2021-05-06 DIAGNOSIS — K210 Gastro-esophageal reflux disease with esophagitis: Secondary | ICD-10-CM | POA: Diagnosis not present

## 2021-05-06 DIAGNOSIS — J189 Pneumonia, unspecified organism: Secondary | ICD-10-CM | POA: Diagnosis not present

## 2021-05-06 DIAGNOSIS — E039 Hypothyroidism, unspecified: Secondary | ICD-10-CM | POA: Diagnosis not present

## 2021-05-06 DIAGNOSIS — U071 COVID-19: Secondary | ICD-10-CM | POA: Diagnosis not present

## 2021-05-06 DIAGNOSIS — F0390 Unspecified dementia without behavioral disturbance: Secondary | ICD-10-CM | POA: Diagnosis not present

## 2021-05-06 DIAGNOSIS — E119 Type 2 diabetes mellitus without complications: Secondary | ICD-10-CM | POA: Diagnosis not present

## 2021-05-08 DIAGNOSIS — I480 Paroxysmal atrial fibrillation: Secondary | ICD-10-CM | POA: Diagnosis not present

## 2021-05-08 DIAGNOSIS — J189 Pneumonia, unspecified organism: Secondary | ICD-10-CM | POA: Diagnosis not present

## 2021-05-08 DIAGNOSIS — N183 Chronic kidney disease, stage 3 unspecified: Secondary | ICD-10-CM | POA: Diagnosis not present

## 2021-05-08 DIAGNOSIS — J9601 Acute respiratory failure with hypoxia: Secondary | ICD-10-CM | POA: Diagnosis not present

## 2021-05-09 DIAGNOSIS — I4891 Unspecified atrial fibrillation: Secondary | ICD-10-CM | POA: Diagnosis not present

## 2021-05-09 DIAGNOSIS — J9690 Respiratory failure, unspecified, unspecified whether with hypoxia or hypercapnia: Secondary | ICD-10-CM | POA: Diagnosis not present

## 2021-05-09 DIAGNOSIS — E039 Hypothyroidism, unspecified: Secondary | ICD-10-CM | POA: Diagnosis not present

## 2021-05-09 DIAGNOSIS — N1831 Chronic kidney disease, stage 3a: Secondary | ICD-10-CM | POA: Diagnosis not present

## 2021-05-16 DIAGNOSIS — F0390 Unspecified dementia without behavioral disturbance: Secondary | ICD-10-CM | POA: Diagnosis not present

## 2021-05-16 DIAGNOSIS — J449 Chronic obstructive pulmonary disease, unspecified: Secondary | ICD-10-CM | POA: Diagnosis not present

## 2021-05-16 DIAGNOSIS — I4891 Unspecified atrial fibrillation: Secondary | ICD-10-CM | POA: Diagnosis not present

## 2021-05-16 DIAGNOSIS — J9690 Respiratory failure, unspecified, unspecified whether with hypoxia or hypercapnia: Secondary | ICD-10-CM | POA: Diagnosis not present

## 2021-05-20 DIAGNOSIS — F0390 Unspecified dementia without behavioral disturbance: Secondary | ICD-10-CM | POA: Diagnosis not present

## 2021-05-20 DIAGNOSIS — J449 Chronic obstructive pulmonary disease, unspecified: Secondary | ICD-10-CM | POA: Diagnosis not present

## 2021-05-20 DIAGNOSIS — R059 Cough, unspecified: Secondary | ICD-10-CM | POA: Diagnosis not present

## 2021-05-20 DIAGNOSIS — J189 Pneumonia, unspecified organism: Secondary | ICD-10-CM | POA: Diagnosis not present

## 2021-05-20 DIAGNOSIS — I4891 Unspecified atrial fibrillation: Secondary | ICD-10-CM | POA: Diagnosis not present

## 2021-05-20 DIAGNOSIS — I10 Essential (primary) hypertension: Secondary | ICD-10-CM | POA: Diagnosis not present

## 2021-05-20 DIAGNOSIS — E119 Type 2 diabetes mellitus without complications: Secondary | ICD-10-CM | POA: Diagnosis not present

## 2021-05-20 DIAGNOSIS — E039 Hypothyroidism, unspecified: Secondary | ICD-10-CM | POA: Diagnosis not present

## 2021-05-20 DIAGNOSIS — K2270 Barrett's esophagus without dysplasia: Secondary | ICD-10-CM | POA: Diagnosis not present

## 2021-05-20 DIAGNOSIS — J188 Other pneumonia, unspecified organism: Secondary | ICD-10-CM | POA: Diagnosis not present

## 2021-05-20 DIAGNOSIS — I2510 Atherosclerotic heart disease of native coronary artery without angina pectoris: Secondary | ICD-10-CM | POA: Diagnosis not present

## 2021-05-20 DIAGNOSIS — J9690 Respiratory failure, unspecified, unspecified whether with hypoxia or hypercapnia: Secondary | ICD-10-CM | POA: Diagnosis not present

## 2021-05-20 DIAGNOSIS — I480 Paroxysmal atrial fibrillation: Secondary | ICD-10-CM | POA: Diagnosis not present

## 2021-05-23 DIAGNOSIS — E119 Type 2 diabetes mellitus without complications: Secondary | ICD-10-CM | POA: Diagnosis not present

## 2021-05-23 DIAGNOSIS — J449 Chronic obstructive pulmonary disease, unspecified: Secondary | ICD-10-CM | POA: Diagnosis not present

## 2021-05-23 DIAGNOSIS — J9690 Respiratory failure, unspecified, unspecified whether with hypoxia or hypercapnia: Secondary | ICD-10-CM | POA: Diagnosis not present

## 2021-05-23 DIAGNOSIS — I4891 Unspecified atrial fibrillation: Secondary | ICD-10-CM | POA: Diagnosis not present

## 2021-05-27 DIAGNOSIS — I4891 Unspecified atrial fibrillation: Secondary | ICD-10-CM | POA: Diagnosis not present

## 2021-05-27 DIAGNOSIS — J449 Chronic obstructive pulmonary disease, unspecified: Secondary | ICD-10-CM | POA: Diagnosis not present

## 2021-05-27 DIAGNOSIS — J9690 Respiratory failure, unspecified, unspecified whether with hypoxia or hypercapnia: Secondary | ICD-10-CM | POA: Diagnosis not present

## 2021-05-27 DIAGNOSIS — J189 Pneumonia, unspecified organism: Secondary | ICD-10-CM | POA: Diagnosis not present

## 2021-05-27 DIAGNOSIS — F0390 Unspecified dementia without behavioral disturbance: Secondary | ICD-10-CM | POA: Diagnosis not present

## 2021-06-02 ENCOUNTER — Ambulatory Visit: Payer: PPO | Admitting: Cardiology

## 2021-06-03 ENCOUNTER — Telehealth: Payer: Self-pay | Admitting: Cardiology

## 2021-06-03 NOTE — Telephone Encounter (Signed)
*  STAT* If patient is at the pharmacy, call can be transferred to refill team.   1. Which medications need to be refilled? (please list name of each medication and dose if known) diltiazem (CARDIZEM CD) 180 MG 24 hr capsule  2. Which pharmacy/location (including street and city if local pharmacy) is medication to be sent to? [place], [place]  3. Do they need a 30 day or 90 day supply? 90 day

## 2021-06-04 DIAGNOSIS — R279 Unspecified lack of coordination: Secondary | ICD-10-CM | POA: Diagnosis not present

## 2021-06-04 DIAGNOSIS — J9601 Acute respiratory failure with hypoxia: Secondary | ICD-10-CM | POA: Diagnosis not present

## 2021-06-04 MED ORDER — DILTIAZEM HCL ER COATED BEADS 180 MG PO CP24: 180.0000 mg | ORAL_CAPSULE | Freq: Every day | ORAL | 3 refills | Status: DC

## 2021-06-04 NOTE — Telephone Encounter (Signed)
Follow Up:     Wife was calling to see if refill had been called in For patient's Diltiazem. Patient was told it took 24 to 48 hours for medicine to be called in.Timothy Mclean

## 2021-06-10 DIAGNOSIS — E1122 Type 2 diabetes mellitus with diabetic chronic kidney disease: Secondary | ICD-10-CM | POA: Diagnosis not present

## 2021-06-10 DIAGNOSIS — E785 Hyperlipidemia, unspecified: Secondary | ICD-10-CM | POA: Diagnosis not present

## 2021-06-10 DIAGNOSIS — E440 Moderate protein-calorie malnutrition: Secondary | ICD-10-CM | POA: Diagnosis not present

## 2021-06-10 DIAGNOSIS — I2510 Atherosclerotic heart disease of native coronary artery without angina pectoris: Secondary | ICD-10-CM | POA: Diagnosis not present

## 2021-06-10 DIAGNOSIS — Z8701 Personal history of pneumonia (recurrent): Secondary | ICD-10-CM | POA: Diagnosis not present

## 2021-06-10 DIAGNOSIS — J9601 Acute respiratory failure with hypoxia: Secondary | ICD-10-CM | POA: Diagnosis not present

## 2021-06-10 DIAGNOSIS — F0280 Dementia in other diseases classified elsewhere without behavioral disturbance: Secondary | ICD-10-CM | POA: Diagnosis not present

## 2021-06-10 DIAGNOSIS — E039 Hypothyroidism, unspecified: Secondary | ICD-10-CM | POA: Diagnosis not present

## 2021-06-10 DIAGNOSIS — Z7901 Long term (current) use of anticoagulants: Secondary | ICD-10-CM | POA: Diagnosis not present

## 2021-06-10 DIAGNOSIS — Z8616 Personal history of COVID-19: Secondary | ICD-10-CM | POA: Diagnosis not present

## 2021-06-10 DIAGNOSIS — N1832 Chronic kidney disease, stage 3b: Secondary | ICD-10-CM | POA: Diagnosis not present

## 2021-06-10 DIAGNOSIS — K2270 Barrett's esophagus without dysplasia: Secondary | ICD-10-CM | POA: Diagnosis not present

## 2021-06-10 DIAGNOSIS — Z7951 Long term (current) use of inhaled steroids: Secondary | ICD-10-CM | POA: Diagnosis not present

## 2021-06-10 DIAGNOSIS — F05 Delirium due to known physiological condition: Secondary | ICD-10-CM | POA: Diagnosis not present

## 2021-06-10 DIAGNOSIS — I482 Chronic atrial fibrillation, unspecified: Secondary | ICD-10-CM | POA: Diagnosis not present

## 2021-06-10 DIAGNOSIS — Z9981 Dependence on supplemental oxygen: Secondary | ICD-10-CM | POA: Diagnosis not present

## 2021-06-10 DIAGNOSIS — K5190 Ulcerative colitis, unspecified, without complications: Secondary | ICD-10-CM | POA: Diagnosis not present

## 2021-06-10 DIAGNOSIS — K219 Gastro-esophageal reflux disease without esophagitis: Secondary | ICD-10-CM | POA: Diagnosis not present

## 2021-06-10 DIAGNOSIS — Z951 Presence of aortocoronary bypass graft: Secondary | ICD-10-CM | POA: Diagnosis not present

## 2021-06-18 DIAGNOSIS — K5190 Ulcerative colitis, unspecified, without complications: Secondary | ICD-10-CM | POA: Diagnosis not present

## 2021-06-18 DIAGNOSIS — F0280 Dementia in other diseases classified elsewhere without behavioral disturbance: Secondary | ICD-10-CM | POA: Diagnosis not present

## 2021-06-18 DIAGNOSIS — N1832 Chronic kidney disease, stage 3b: Secondary | ICD-10-CM | POA: Diagnosis not present

## 2021-06-18 DIAGNOSIS — Z7951 Long term (current) use of inhaled steroids: Secondary | ICD-10-CM | POA: Diagnosis not present

## 2021-06-18 DIAGNOSIS — I482 Chronic atrial fibrillation, unspecified: Secondary | ICD-10-CM | POA: Diagnosis not present

## 2021-06-18 DIAGNOSIS — Z7901 Long term (current) use of anticoagulants: Secondary | ICD-10-CM | POA: Diagnosis not present

## 2021-06-18 DIAGNOSIS — Z9981 Dependence on supplemental oxygen: Secondary | ICD-10-CM | POA: Diagnosis not present

## 2021-06-18 DIAGNOSIS — J9601 Acute respiratory failure with hypoxia: Secondary | ICD-10-CM | POA: Diagnosis not present

## 2021-06-18 DIAGNOSIS — E039 Hypothyroidism, unspecified: Secondary | ICD-10-CM | POA: Diagnosis not present

## 2021-06-18 DIAGNOSIS — E440 Moderate protein-calorie malnutrition: Secondary | ICD-10-CM | POA: Diagnosis not present

## 2021-06-18 DIAGNOSIS — F05 Delirium due to known physiological condition: Secondary | ICD-10-CM | POA: Diagnosis not present

## 2021-06-18 DIAGNOSIS — Z951 Presence of aortocoronary bypass graft: Secondary | ICD-10-CM | POA: Diagnosis not present

## 2021-06-18 DIAGNOSIS — K219 Gastro-esophageal reflux disease without esophagitis: Secondary | ICD-10-CM | POA: Diagnosis not present

## 2021-06-18 DIAGNOSIS — E785 Hyperlipidemia, unspecified: Secondary | ICD-10-CM | POA: Diagnosis not present

## 2021-06-18 DIAGNOSIS — E1122 Type 2 diabetes mellitus with diabetic chronic kidney disease: Secondary | ICD-10-CM | POA: Diagnosis not present

## 2021-06-18 DIAGNOSIS — Z8701 Personal history of pneumonia (recurrent): Secondary | ICD-10-CM | POA: Diagnosis not present

## 2021-06-18 DIAGNOSIS — Z8616 Personal history of COVID-19: Secondary | ICD-10-CM | POA: Diagnosis not present

## 2021-06-18 DIAGNOSIS — I2510 Atherosclerotic heart disease of native coronary artery without angina pectoris: Secondary | ICD-10-CM | POA: Diagnosis not present

## 2021-06-18 DIAGNOSIS — K2270 Barrett's esophagus without dysplasia: Secondary | ICD-10-CM | POA: Diagnosis not present

## 2021-06-20 DIAGNOSIS — E785 Hyperlipidemia, unspecified: Secondary | ICD-10-CM | POA: Diagnosis not present

## 2021-06-20 DIAGNOSIS — F0280 Dementia in other diseases classified elsewhere without behavioral disturbance: Secondary | ICD-10-CM | POA: Diagnosis not present

## 2021-06-20 DIAGNOSIS — Z8701 Personal history of pneumonia (recurrent): Secondary | ICD-10-CM | POA: Diagnosis not present

## 2021-06-20 DIAGNOSIS — K5190 Ulcerative colitis, unspecified, without complications: Secondary | ICD-10-CM | POA: Diagnosis not present

## 2021-06-20 DIAGNOSIS — N1832 Chronic kidney disease, stage 3b: Secondary | ICD-10-CM | POA: Diagnosis not present

## 2021-06-20 DIAGNOSIS — E039 Hypothyroidism, unspecified: Secondary | ICD-10-CM | POA: Diagnosis not present

## 2021-06-20 DIAGNOSIS — F05 Delirium due to known physiological condition: Secondary | ICD-10-CM | POA: Diagnosis not present

## 2021-06-20 DIAGNOSIS — K219 Gastro-esophageal reflux disease without esophagitis: Secondary | ICD-10-CM | POA: Diagnosis not present

## 2021-06-20 DIAGNOSIS — J9601 Acute respiratory failure with hypoxia: Secondary | ICD-10-CM | POA: Diagnosis not present

## 2021-06-20 DIAGNOSIS — Z7951 Long term (current) use of inhaled steroids: Secondary | ICD-10-CM | POA: Diagnosis not present

## 2021-06-20 DIAGNOSIS — E440 Moderate protein-calorie malnutrition: Secondary | ICD-10-CM | POA: Diagnosis not present

## 2021-06-20 DIAGNOSIS — K2270 Barrett's esophagus without dysplasia: Secondary | ICD-10-CM | POA: Diagnosis not present

## 2021-06-20 DIAGNOSIS — Z8616 Personal history of COVID-19: Secondary | ICD-10-CM | POA: Diagnosis not present

## 2021-06-20 DIAGNOSIS — Z9981 Dependence on supplemental oxygen: Secondary | ICD-10-CM | POA: Diagnosis not present

## 2021-06-20 DIAGNOSIS — E1122 Type 2 diabetes mellitus with diabetic chronic kidney disease: Secondary | ICD-10-CM | POA: Diagnosis not present

## 2021-06-20 DIAGNOSIS — Z7901 Long term (current) use of anticoagulants: Secondary | ICD-10-CM | POA: Diagnosis not present

## 2021-06-20 DIAGNOSIS — I2510 Atherosclerotic heart disease of native coronary artery without angina pectoris: Secondary | ICD-10-CM | POA: Diagnosis not present

## 2021-06-20 DIAGNOSIS — Z951 Presence of aortocoronary bypass graft: Secondary | ICD-10-CM | POA: Diagnosis not present

## 2021-06-20 DIAGNOSIS — I482 Chronic atrial fibrillation, unspecified: Secondary | ICD-10-CM | POA: Diagnosis not present

## 2021-06-26 ENCOUNTER — Ambulatory Visit
Admission: RE | Admit: 2021-06-26 | Discharge: 2021-06-26 | Disposition: A | Discharge status: Home / Self Care | Payer: PPO | Source: Ambulatory Visit | Attending: Family Medicine | Admitting: Family Medicine

## 2021-06-26 ENCOUNTER — Other Ambulatory Visit: Payer: Self-pay | Admitting: Family Medicine

## 2021-06-26 ENCOUNTER — Other Ambulatory Visit: Payer: Self-pay

## 2021-06-26 DIAGNOSIS — Z8701 Personal history of pneumonia (recurrent): Secondary | ICD-10-CM | POA: Diagnosis not present

## 2021-06-26 DIAGNOSIS — J439 Emphysema, unspecified: Secondary | ICD-10-CM | POA: Diagnosis not present

## 2021-06-26 DIAGNOSIS — I700 Atherosclerosis of aorta: Secondary | ICD-10-CM | POA: Diagnosis not present

## 2021-06-26 DIAGNOSIS — J189 Pneumonia, unspecified organism: Secondary | ICD-10-CM

## 2021-06-26 DIAGNOSIS — E119 Type 2 diabetes mellitus without complications: Secondary | ICD-10-CM | POA: Diagnosis not present

## 2021-06-26 DIAGNOSIS — R634 Abnormal weight loss: Secondary | ICD-10-CM | POA: Diagnosis not present

## 2021-06-26 DIAGNOSIS — N183 Chronic kidney disease, stage 3 unspecified: Secondary | ICD-10-CM | POA: Diagnosis not present

## 2021-07-01 DIAGNOSIS — E119 Type 2 diabetes mellitus without complications: Secondary | ICD-10-CM | POA: Diagnosis not present

## 2021-07-01 DIAGNOSIS — U071 COVID-19: Secondary | ICD-10-CM | POA: Diagnosis not present

## 2021-07-01 DIAGNOSIS — J439 Emphysema, unspecified: Secondary | ICD-10-CM | POA: Diagnosis not present

## 2021-07-01 DIAGNOSIS — I872 Venous insufficiency (chronic) (peripheral): Secondary | ICD-10-CM | POA: Diagnosis not present

## 2021-07-03 NOTE — Progress Notes (Signed)
Cardiology Office Note:    Date:  07/04/2021   ID:  LEVAUGHN PUCCINELLI, DOB [birth year], MRN [id number]  PCP:  Derinda Late, MD  Cardiologist:  Glenetta Hew, MD   Referring MD: Derinda Late, MD    Follow-up appointment  History of Present Illness:    Timothy Mclean is a 80 y.o. male with a hx of CAD status post PCI and CABG x4 in 1997, chronic atrial fibrillation, chronic bronchitis, COPD, dyslipidemia, GERD who presents for annual follow-up.  The patient was last seen November 2021 by Dr. Ellyn Hack.  At that time he was doing well with no major issues.  He had occasional cough and may be every now and then felt a skipped beat but not necessarily anything consistent with atrial fibrillation.  He denies any chest pain or pressure with rest or exertion.  He was not doing much exertion but was trying to walk most days.  He walks slowly with his cane.  He had no exertional dyspnea just gets tired.  He has no heart failure symptoms such as PND, orthopnea, or edema.  Other than a few skipped beats here and there he does not have any sensation of atrial fibrillation or fast/slow heart rates.  Today, he feels pretty good without any cardiac related issues. He was in the hospital back in September for [place] [facility]. He was placed on 9L of oxygen on admission and found to have bilateral pneumonia. He was treated with antibiotic therapy and discharged on 2L of oxygen. He has been doing physical therapy and feels like he is getting stronger. His appetite has increased and he is gaining some weight back. He had not had any issues with his breathing since discharge. He does not feel when he is in Afib and I suspect that he stays in rate-controlled Afib. He is protected on Eliquis without any significant bleeding. He does state that if he hits his hand on something he will bruise locally. He has not noticed any blood in his urine or stool. He tried to continue to exercise and watch his diet. He was asked to monitor  his BP a few times a week. His blood pressure is 106/58 today in the clinic. Oxygen is 99% on room air and his HR is rate-controlled in the 80s.   Reports no shortness of breath nor dyspnea on exertion. Reports no chest pain, pressure, or tightness. No edema, orthopnea, PND. Reports no palpitations.  No syncope or near syncope.    Past Medical History:  Diagnosis Date   Allergy    Anxiety    Asthma    since 2013   Barrett's esophagus 03/09/2014   Bilateral lumbar radiculopathy    since 2013   CAD S/P percutaneous coronary angioplasty 1987   a. 1987 angioplasty and BMS- Cx; b. 1997 CABG x 4;  c. MYOVIEW 1/'05: ? Ischemia vs. artifact --> sent for cath - patent grafts.   Chronic atrial fibrillation ([place])    a. Prev on amiodarone-->d/c 08/2016 in setting of recurrent AF, LFT abnormalities, and pulmonary symptoms;  b. CHA2DS2VASc = 4-->eliquis.   Chronic bronchitis ([place])    since 2007   COPD (chronic obstructive pulmonary disease) (HCC)    DDD (degenerative disc disease)    Diverticulosis    Dyslipidemia, goal LDL below 70    Doing better off of Zocor, currently on Lescol    ED (erectile dysfunction)    Elevated LFTs    Esophageal reflux    GERD (gastroesophageal reflux  disease)    Gout, unspecified    Hearing loss    Helicobacter pylori gastritis 07/2009   Herpes zoster 2009   Hypothyroidism    since 2010   Iron deficiency anemia, unspecified    Leg weakness    a. 09/2016 ABI's: R 1.05, L 0.98.   Other and unspecified hyperlipidemia    Other B-complex deficiencies    Personal history of colonic polyps    Prostatitis    chronic ulcerative   Pulmonary fibrosis (HCC)    and bronchiectasis, since 2015   Renal stone    Right   S/P CABG x 4 1997   a) LIMA-LAD, SVG to OM, SVG-dRCA-RPL; b) FALSE + MYOVIEW --> CATH 1/'05: 100% LAD after widelly patent D1, all Cx OM branches 100%, pRCA 100%, SVG-PDA patent w/ retrograde filling of RPL, SVG-OM widely patent ~ normal OM, LIMA-LAD  patent.   Spinal stenosis    ST elevation myocardial infarction (STEMI) of inferior wall, subsequent episode of care ([place]) 1987, 1997   a) PTCA of Cx; b) CABG    Type II or unspecified type diabetes mellitus without mention of complication, not stated as uncontrolled    no meds   Ulcerative colitis, unspecified    Unspecified essential hypertension    Vitamin D deficiency     Past Surgical History:  Procedure Laterality Date   BLEPHAROPLASTY Bilateral    for ptosis   CARDIAC CATHETERIZATION  08/17/2003   "False positive stress test " grafts patent; RCA proximal 100% LAD 100% occlusion after normal D1 with 80% ostial as SP1; circumflex patent but OM1 on to all occluded; EF 50-55%   CARDIAC CATHETERIZATION  11/1995   Preop CABG: LAD 90% at D1, circumflex 100 and OM a 90% proximal, 80% distal   Carotid Duplex Doppler  12/23/2009   Right&Left ICAs 0-49%, mildly abnormal study,    CHOLECYSTECTOMY     COLONOSCOPY     CORONARY ARTERY BYPASS GRAFT  11/1995   LIMA-LAD, SVG to OM, SVG-dRCA-RPL   ESOPHAGOGASTRODUODENOSCOPY     FLEXIBLE SIGMOIDOSCOPY     NM MYOCAR PERF EJECTION FRACTION  07/03/2003   FALSE POSITIVE TEST; Bruce protocol, negative test with scintigraphic evidence of inferoapical scar, diaphragmatic attenuation, moderate ischemia.   NM MYOVIEW LTD  2005   Questionable ischemia that is likely either infarct versus artifact; normal   TRANSTHORACIC ECHOCARDIOGRAM  12/2017   EF 55-60%.  Unable to assess diastolic function due to A. fib.  Moderate LA/RA dilation.  Mild MR.    Current Medications: Current Meds  Medication Sig   ADVAIR DISKUS 250-50 MCG/DOSE AEPB Inhale 1 puff into the lungs 2 (two) times daily.    albuterol (PROVENTIL HFA;VENTOLIN HFA) 108 (90 Base) MCG/ACT inhaler Inhale into the lungs every 6 (six) hours as needed for wheezing or shortness of breath.   apixaban (ELIQUIS) 2.5 MG TABS tablet Take 1 tablet (2.5 mg total) by mouth 2 (two) times daily.   balsalazide  (COLAZAL) 750 MG capsule TAKE 2 CAPSULES BY MOUTH TWICE DAILY (Patient taking differently: Take 2,250 mg by mouth in the morning and at bedtime.)   Cholecalciferol (VITAMIN D3) 50 MCG (2000 UT) capsule Take 2,000 Units by mouth in the morning and at bedtime.   cyanocobalamin 1000 MCG tablet Take 1,000 mcg by mouth in the morning and at bedtime.   diltiazem (CARDIZEM CD) 180 MG 24 hr capsule Take 1 capsule (180 mg total) by mouth daily.   ezetimibe (ZETIA) 10 MG tablet Take 10  mg by mouth daily.   finasteride (PROSCAR) 5 MG tablet Take 5 mg by mouth daily.   furosemide (LASIX) 20 MG tablet Take 20 mg by mouth daily.   insulin aspart (NOVOLOG) 100 UNIT/ML injection Inject 6 Units into the skin 3 (three) times daily with meals.   insulin aspart (NOVOLOG) 100 UNIT/ML injection Inject 0-9 Units into the skin 3 (three) times daily with meals. 0-9 Units, Subcutaneous, 3 times daily with meals,  CBG < 70: Implement Hypoglycemia measuresCBG 70 - 120: 0 units CBG 121 - 150: 1 unit CBG 151 - 200: 2 units CBG 201 - 250: 3 units CBG 251 - 300: 5 units CBG 301 - 350: 7 units CBG 351 - 400: 9 units CBG > 400: call MD   insulin detemir (LEVEMIR) 100 UNIT/ML injection Inject 0.15 mLs (15 Units total) into the skin at bedtime.   insulin detemir (LEVEMIR) 100 UNIT/ML injection Inject 0.38 mLs (38 Units total) into the skin every morning.   JANUVIA 100 MG tablet Take 100 mg by mouth daily.   levothyroxine (SYNTHROID) 50 MCG tablet Take 50 mcg by mouth daily.   oxybutynin (DITROPAN) 5 MG tablet Take 5 mg by mouth daily.   pantoprazole (PROTONIX) 40 MG tablet Take 1 tablet by mouth 2 (two) times daily.    rosuvastatin (CRESTOR) 40 MG tablet Take 1 tablet by mouth daily.     Allergies:   Atorvastatin, Fluvastatin, Lisinopril, Metformin and related, Pravastatin, Simvastatin, and Spiriva handihaler [tiotropium bromide monohydrate]   Social History   Socioeconomic History   Marital status: Married    Spouse name: Not  on file   Number of children: 1   Years of education: Not on file   Highest education level: Not on file  Occupational History   Occupation: retired  Tobacco Use   Smoking status: Former    Packs/day: 1.00    Years: 32.00    Pack years: 32.00    Types: Cigarettes    Quit date: 08/27/1984    Years since quitting: 36.8   Smokeless tobacco: Never  Substance and Sexual Activity   Alcohol use: No   Drug use: No   Sexual activity: Not on file  Other Topics Concern   Not on file  Social History Narrative   He is a married father of one. Does not smoke and does not drink. He walks routinely and also does stationary bike. 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 Currently retired Librarian, academic)   Social Determinants of Radio broadcast assistant Strain: Not on file  Food Insecurity: Not on file  Transportation Needs: Not on file  Physical Activity: Not on file  Stress: Not on file  Social Connections: Not on file     Family History: The patient's family history includes CVA in his sister; Congestive Heart Failure in his mother; Dementia in his father; Diabetes in his mother and sister; Heart disease in his sister; Hypertension in his sister; Lung cancer in his brother; Pneumonia in his father. There is no history of Colon cancer, Esophageal cancer, Rectal cancer, or Stomach cancer.  ROS:   Please see the history of present illness.     All other systems reviewed and are negative.  EKGs/Labs/Other Studies Reviewed:    The following studies were reviewed today:  05/01/2021 Echocardiogram   IMPRESSIONS     1. Left ventricular ejection fraction, by estimation, is 60 to 65%. The  left ventricle  has normal function. The left ventricle has no regional  wall motion abnormalities. Left ventricular diastolic function could not  be evaluated.   2. Right ventricular systolic function is normal. The right ventricular   size is normal.   3. Left atrial size was moderately dilated.   4. Right atrial size was mildly dilated.   5. The mitral valve is normal in structure. No evidence of mitral valve  regurgitation. No evidence of mitral stenosis.   6. The aortic valve has an indeterminant number of cusps. Aortic valve  regurgitation is not visualized. No aortic stenosis is present.   7. Aortic dilatation noted. There is borderline dilatation of the aortic  root, measuring 38 mm.   8. The inferior vena cava is normal in size with greater than 50%  respiratory variability, suggesting right atrial pressure of 3 mmHg.    CLINICAL DATA:  Follow-up pneumonia, diabetes, previous open-heart surgery   EXAM: CHEST - 2 VIEW   COMPARISON:  05/01/2021   FINDINGS: Previous coronary bypass changes. Normal heart size and vascularity. Improvement in the previous mild edema pattern. Chronic appearing bibasilar scarring/mild fibrosis. Mild hyperinflation. Suspect background COPD/emphysema. No current acute airspace process, pneumonia, collapse or consolidation. Negative for edema, effusion or pneumothorax. Trachea midline. Aorta atherosclerotic. No acute osseous finding.   IMPRESSION: Stable COPD/emphysema pattern and bibasilar fibrosis/scarring.   Remote coronary bypass changes.   No acute finding by plain radiography.     Electronically Signed   By: Jerilynn Mages.  Shick M.D.   On: 06/26/2021 12:06  EKG:  EKG is not ordered today.    Recent Labs: 05/01/2021: ALT 55; B Natriuretic Peptide 210.4 05/04/2021: BUN 36; Creatinine, Ser 1.31; Hemoglobin 13.9; Platelets 178; Potassium 3.7; Sodium 138  Recent Lipid Panel No results found for: CHOL, TRIG, HDL, CHOLHDL, VLDL, LDLCALC, LDLDIRECT  Physical Exam:    VS:  BP (!) 106/58    Pulse 80    Ht 6' (1.829 m)    Wt 166 lb 3.2 oz (75.4 kg)    SpO2 99%    BMI 22.54 kg/m     Wt Readings from Last 3 Encounters:  07/04/21 166 lb 3.2 oz (75.4 kg)  05/03/21 162 lb 14.7 oz  (73.9 kg)  05/28/20 167 lb 9.6 oz (76 kg)     GEN:  Well nourished, well developed in no acute distress HEENT: Normal NECK: No JVD; No carotid bruits LYMPHATICS: No lymphadenopathy CARDIAC: irregularly irregular, no murmurs, rubs, gallops RESPIRATORY: Diminished breath sounds in bilateral lower lobes, tight without much air movement. Upper lobes with good air movement  ABDOMEN: Soft, non-tender, non-distended MUSCULOSKELETAL:  No edema; No deformity  SKIN: Warm and dry NEUROLOGIC:  Alert and oriented x 3 PSYCHIATRIC:  Normal affect   PLAN:     1.  CAD status post CABG x4 in 1997 -No current chest pain -Preserved EF on echocardiogram from 2019 with no regional wall motion abnormalities -No further studies indicated at this time  2.  Essential hypertension -Well-controlled today in the clinic -Continue moderate to low-dose diltiazem  3.  History of STEMI -Distant history of MI with no further episodes since 1997 -Last Myoview in cardiac catheterization was 2005, likely false positive stress test  4.  Permanent atrial fibrillation -CHA2DS2-VASc score is 4 -Continue Eliquis and diltiazem for rate control -Currently no bleeding on Eliquis -He had previously tried amiodarone but he does not really know when he is in atrial fibrillation or not so it was stopped last year -Calcium channel  blocker chosen over beta-blocker because less fatigue  5.  Chronic anticoagulation on Eliquis -Well-tolerated without any bleeding  6.  Systolic ejection murmur -Echocardiogram stable in 2019. -He may have some mild aortic sclerosis and also mild MR  7.  Hypotension due to medication -BP medications have been reduced in the past -He is essentially only on diltiazem -Blood pressures have been well controlled -Encouraged to take his BP at home a few times a week  8. Hyperlipidemia -Continue Zetia and Crestor  -No recent Lipid panel or LFTs -He is getting lab work done through his PCP  Jan/Feb 2023. I have asked to add on a Lipid panel and LFTs at that time and send these results to our office.   Medication Adjustments/Labs and Tests Ordered: Current medicines are reviewed at length with the patient today.  Concerns regarding medicines are outlined above.  No orders of the defined types were placed in this encounter.  No orders of the defined types were placed in this encounter.   Signed, Elgie Collard, PA-C  07/04/2021 2:36 PM    [facility]

## 2021-07-04 ENCOUNTER — Other Ambulatory Visit: Payer: Self-pay

## 2021-07-04 ENCOUNTER — Ambulatory Visit: Payer: PPO | Admitting: Physician Assistant

## 2021-07-04 ENCOUNTER — Encounter: Payer: Self-pay | Admitting: Physician Assistant

## 2021-07-04 VITALS — BP 106/58 | HR 80 | Ht 72.0 in | Wt 166.2 lb

## 2021-07-04 DIAGNOSIS — D6869 Other thrombophilia: Secondary | ICD-10-CM

## 2021-07-04 DIAGNOSIS — I952 Hypotension due to drugs: Secondary | ICD-10-CM | POA: Diagnosis not present

## 2021-07-04 DIAGNOSIS — I4821 Permanent atrial fibrillation: Secondary | ICD-10-CM

## 2021-07-04 DIAGNOSIS — I4891 Unspecified atrial fibrillation: Secondary | ICD-10-CM

## 2021-07-04 DIAGNOSIS — I10 Essential (primary) hypertension: Secondary | ICD-10-CM

## 2021-07-04 DIAGNOSIS — I2119 ST elevation (STEMI) myocardial infarction involving other coronary artery of inferior wall: Secondary | ICD-10-CM | POA: Diagnosis not present

## 2021-07-04 DIAGNOSIS — I2510 Atherosclerotic heart disease of native coronary artery without angina pectoris: Secondary | ICD-10-CM | POA: Diagnosis not present

## 2021-07-04 DIAGNOSIS — R011 Cardiac murmur, unspecified: Secondary | ICD-10-CM | POA: Diagnosis not present

## 2021-07-04 NOTE — Patient Instructions (Addendum)
Medication Instructions:  Your physician recommends that you continue on your current medications as directed. Please refer to the Current Medication list given to you today.  *If you need a refill on your cardiac medications before your next appointment, please call your pharmacy*   Lab Work: None ordered  If you have labs (blood work) drawn today and your tests are completely normal, you will receive your results only by: [place] (if you have MyChart) OR A paper copy in the mail If you have any lab test that is abnormal or we need to change your treatment, we will call you to review the results.   Testing/Procedures: None ordered   Follow-Up: At [facility], you and your health needs are our priority.  As part of our continuing mission to provide you with exceptional heart care, we have created designated Provider Care Teams.  These Care Teams include your primary Cardiologist (physician) and Advanced Practice Providers (APPs -  Physician Assistants and Nurse Practitioners) who all work together to provide you with the care you need, when you need it.  We recommend signing up for the patient portal called "MyChart".  Sign up information is provided on this After Visit Summary.  MyChart is used to connect with patients for Virtual Visits (Telemedicine).  Patients are able to view lab/test results, encounter notes, upcoming appointments, etc.  Non-urgent messages can be sent to your provider as well.   To learn more about what you can do with MyChart, go to [email or web address].    Your next appointment:   12 month(s)  The format for your next appointment:   In Person  Provider:   Glenetta Hew, MD     Other Instructions Please have your Primary care dr draw a Lipid Panel and LFT when he does your lab work in January and have them fax it to [place] at [phone]

## 2021-07-07 DIAGNOSIS — E119 Type 2 diabetes mellitus without complications: Secondary | ICD-10-CM | POA: Diagnosis not present

## 2021-07-07 DIAGNOSIS — Z961 Presence of intraocular lens: Secondary | ICD-10-CM | POA: Diagnosis not present

## 2021-07-07 DIAGNOSIS — H02403 Unspecified ptosis of bilateral eyelids: Secondary | ICD-10-CM | POA: Diagnosis not present

## 2021-07-07 DIAGNOSIS — H04123 Dry eye syndrome of bilateral lacrimal glands: Secondary | ICD-10-CM | POA: Diagnosis not present

## 2021-08-24 ENCOUNTER — Emergency Department (HOSPITAL_BASED_OUTPATIENT_CLINIC_OR_DEPARTMENT_OTHER)
Admission: EM | Admit: 2021-08-24 | Discharge: 2021-08-24 | Disposition: A | Discharge status: Home / Self Care | Payer: PPO | Attending: Emergency Medicine | Admitting: Emergency Medicine

## 2021-08-24 ENCOUNTER — Encounter (HOSPITAL_BASED_OUTPATIENT_CLINIC_OR_DEPARTMENT_OTHER): Payer: Self-pay

## 2021-08-24 ENCOUNTER — Emergency Department (HOSPITAL_BASED_OUTPATIENT_CLINIC_OR_DEPARTMENT_OTHER): Payer: PPO | Admitting: Radiology

## 2021-08-24 ENCOUNTER — Other Ambulatory Visit: Payer: Self-pay

## 2021-08-24 DIAGNOSIS — Z951 Presence of aortocoronary bypass graft: Secondary | ICD-10-CM | POA: Insufficient documentation

## 2021-08-24 DIAGNOSIS — J45909 Unspecified asthma, uncomplicated: Secondary | ICD-10-CM | POA: Diagnosis not present

## 2021-08-24 DIAGNOSIS — M79605 Pain in left leg: Secondary | ICD-10-CM | POA: Diagnosis present

## 2021-08-24 DIAGNOSIS — I2510 Atherosclerotic heart disease of native coronary artery without angina pectoris: Secondary | ICD-10-CM | POA: Diagnosis not present

## 2021-08-24 DIAGNOSIS — E119 Type 2 diabetes mellitus without complications: Secondary | ICD-10-CM | POA: Insufficient documentation

## 2021-08-24 DIAGNOSIS — J449 Chronic obstructive pulmonary disease, unspecified: Secondary | ICD-10-CM | POA: Diagnosis not present

## 2021-08-24 DIAGNOSIS — I10 Essential (primary) hypertension: Secondary | ICD-10-CM | POA: Insufficient documentation

## 2021-08-24 DIAGNOSIS — E039 Hypothyroidism, unspecified: Secondary | ICD-10-CM | POA: Insufficient documentation

## 2021-08-24 LAB — CBC WITH DIFFERENTIAL/PLATELET
Abs Immature Granulocytes: 0.03 10*3/uL (ref 0.00–0.07)
Basophils Absolute: 0 10*3/uL (ref 0.0–0.1)
Basophils Relative: 1 %
Eosinophils Absolute: 0 10*3/uL (ref 0.0–0.5)
Eosinophils Relative: 0 %
HCT: 36.8 % — ABNORMAL LOW (ref 39.0–52.0)
Hemoglobin: 11.8 g/dL — ABNORMAL LOW (ref 13.0–17.0)
Immature Granulocytes: 0 %
Lymphocytes Relative: 15 %
Lymphs Abs: 1.3 10*3/uL (ref 0.7–4.0)
MCH: 25.1 pg — ABNORMAL LOW (ref 26.0–34.0)
MCHC: 32.1 g/dL (ref 30.0–36.0)
MCV: 78.3 fL — ABNORMAL LOW (ref 80.0–100.0)
Monocytes Absolute: 0.6 10*3/uL (ref 0.1–1.0)
Monocytes Relative: 7 %
Neutro Abs: 6.6 10*3/uL (ref 1.7–7.7)
Neutrophils Relative %: 77 %
Platelets: 218 10*3/uL (ref 150–400)
RBC: 4.7 MIL/uL (ref 4.22–5.81)
RDW: 15.6 % — ABNORMAL HIGH (ref 11.5–15.5)
WBC: 8.6 10*3/uL (ref 4.0–10.5)
nRBC: 0 % (ref 0.0–0.2)

## 2021-08-24 LAB — COMPREHENSIVE METABOLIC PANEL
ALT: 13 U/L (ref 0–44)
AST: 25 U/L (ref 15–41)
Albumin: 4 g/dL (ref 3.5–5.0)
Alkaline Phosphatase: 67 U/L (ref 38–126)
Anion gap: 9 (ref 5–15)
BUN: 28 mg/dL — ABNORMAL HIGH (ref 8–23)
CO2: 25 mmol/L (ref 22–32)
Calcium: 9.2 mg/dL (ref 8.9–10.3)
Chloride: 99 mmol/L (ref 98–111)
Creatinine, Ser: 1.8 mg/dL — ABNORMAL HIGH (ref 0.61–1.24)
GFR, Estimated: 37 mL/min — ABNORMAL LOW (ref >60)
Glucose, Bld: 238 mg/dL — ABNORMAL HIGH (ref 70–99)
Potassium: 4.3 mmol/L (ref 3.5–5.1)
Sodium: 133 mmol/L — ABNORMAL LOW (ref 135–145)
Total Bilirubin: 0.5 mg/dL (ref 0.3–1.2)
Total Protein: 6.9 g/dL (ref 6.5–8.1)

## 2021-08-24 LAB — CK: Total CK: 89 U/L (ref 49–397)

## 2021-08-24 LAB — MAGNESIUM: Magnesium: 2.1 mg/dL (ref 1.7–2.4)

## 2021-08-24 MED ORDER — SODIUM CHLORIDE 0.9 % IV BOLUS
500.0000 mL | Freq: Once | INTRAVENOUS | Status: AC
  Administered 2021-08-24: 500 mL via INTRAVENOUS

## 2021-08-24 MED ORDER — DIAZEPAM 5 MG/ML IJ SOLN
2.5000 mg | Freq: Once | INTRAMUSCULAR | Status: AC
  Administered 2021-08-24: 2.5 mg via INTRAVENOUS
  Filled 2021-08-24: qty 2

## 2021-08-24 NOTE — ED Provider Notes (Signed)
Emergency Department Provider Note   I have reviewed the triage vital signs and the nursing notes.   HISTORY  Chief Complaint Leg Pain   HPI Timothy Mclean is a 81 y.o. male with PMH of CAD, COPD, PAF on anticoagulation, presents to the emergency department for evaluation of pain in the left thigh and calf.  Wife is at bedside.  Patient arrives by EMS.  He has been having intermittent spasming pain in the hamstring and calf area.  Wife states that he did begin exercising again yesterday.  He had been fairly good about exercise since coming home from the nursing facility.  He had home health for a time but when they left he is decreased his exercise.  Yesterday he rode on the stationary bike and did some walking which was much more than normal.  Around noon today he developed cramping/spasm pain in the left thigh mainly.  No color change to the foot.  No falls or injuries.  He typically ambulates with a walker/cane at home.    Past Medical History:  Diagnosis Date   Allergy    Anxiety    Asthma    since 2013   Barrett's esophagus 03/09/2014   Bilateral lumbar radiculopathy    since 2013   CAD S/P percutaneous coronary angioplasty 1987   a. 1987 angioplasty and BMS- Cx; b. 1997 CABG x 4;  c. MYOVIEW 1/'05: ? Ischemia vs. artifact --> sent for cath - patent grafts.   Chronic atrial fibrillation ([place])    a. Prev on amiodarone-->d/c 08/2016 in setting of recurrent AF, LFT abnormalities, and pulmonary symptoms;  b. CHA2DS2VASc = 4-->eliquis.   Chronic bronchitis ([place])    since 2007   COPD (chronic obstructive pulmonary disease) (HCC)    DDD (degenerative disc disease)    Diverticulosis    Dyslipidemia, goal LDL below 70    Doing better off of Zocor, currently on Lescol    ED (erectile dysfunction)    Elevated LFTs    Esophageal reflux    GERD (gastroesophageal reflux disease)    Gout, unspecified    Hearing loss    Helicobacter pylori gastritis 07/2009   Herpes zoster 2009    Hypothyroidism    since 2010   Iron deficiency anemia, unspecified    Leg weakness    a. 09/2016 ABI's: R 1.05, L 0.98.   Other and unspecified hyperlipidemia    Other B-complex deficiencies    Personal history of colonic polyps    Prostatitis    chronic ulcerative   Pulmonary fibrosis (HCC)    and bronchiectasis, since 2015   Renal stone    Right   S/P CABG x 4 1997   a) LIMA-LAD, SVG to OM, SVG-dRCA-RPL; b) FALSE + MYOVIEW --> CATH 1/'05: 100% LAD after widelly patent D1, all Cx OM branches 100%, pRCA 100%, SVG-PDA patent w/ retrograde filling of RPL, SVG-OM widely patent ~ normal OM, LIMA-LAD patent.   Spinal stenosis    ST elevation myocardial infarction (STEMI) of inferior wall, subsequent episode of care ([place]) 1987, 1997   a) PTCA of Cx; b) CABG    Type II or unspecified type diabetes mellitus without mention of complication, not stated as uncontrolled    no meds   Ulcerative colitis, unspecified    Unspecified essential hypertension    Vitamin D deficiency     Review of Systems  Constitutional: No fever/chills Cardiovascular: Denies chest pain. Respiratory: Denies shortness of breath. Gastrointestinal: No abdominal pain.  Genitourinary: Negative for dysuria. Musculoskeletal: Negative for back pain. Positive left leg/thigh pain.  Skin: Negative for rash. Neurological: Negative for headaches.   ____________________________________________   PHYSICAL EXAM:  VITAL SIGNS: ED Triage Vitals  Enc Vitals Group     BP 08/24/21 2040 (!) 146/70     Pulse Rate 08/24/21 2040 87     Resp 08/24/21 2040 15     Temp 08/24/21 2040 98.4 F (36.9 C)     Temp Source 08/24/21 2040 Oral     SpO2 08/24/21 2040 98 %     Weight 08/24/21 2041 167 lb (75.8 kg)     Height 08/24/21 2041 6' (1.829 m)   Constitutional: Alert and oriented. Intermittently grimacing and grabbing his left hamstring area.  Eyes: Conjunctivae are normal. Head: Atraumatic. Nose: No  congestion/rhinnorhea. Mouth/Throat: Mucous membranes are moist.  Neck: No stridor.   Cardiovascular: Normal rate, regular rhythm. Good peripheral circulation. Grossly normal heart sounds.   Respiratory: Normal respiratory effort.  No retractions. Lungs CTAB. Gastrointestinal: Soft and nontender. No distention.  Musculoskeletal: Normal range of motion of the left hip.  Normal pulses and sensation.  Palpable muscle spasming in the hamstring and calf area intermittently when patient is having pain.  No bony tenderness or deformity. Neurologic:  Normal speech and language.  Skin:  Skin is warm, dry and intact. No rash noted.   ____________________________________________   LABS (all labs ordered are listed, but only abnormal results are displayed)  Labs Reviewed  COMPREHENSIVE METABOLIC PANEL - Abnormal; Notable for the following components:      Result Value   Sodium 133 (*)    Glucose, Bld 238 (*)    BUN 28 (*)    Creatinine, Ser 1.80 (*)    GFR, Estimated 37 (*)    All other components within normal limits  CBC WITH DIFFERENTIAL/PLATELET - Abnormal; Notable for the following components:   Hemoglobin 11.8 (*)    HCT 36.8 (*)    MCV 78.3 (*)    MCH 25.1 (*)    RDW 15.6 (*)    All other components within normal limits  MAGNESIUM  CK   ____________________________________________  RADIOLOGY  DG Hip Unilat W or Wo Pelvis 2-3 Views Left  Result Date: 08/24/2021 CLINICAL DATA:  Left leg pain EXAM: DG HIP (WITH OR WITHOUT PELVIS) 2-3V LEFT COMPARISON:  None. FINDINGS: Normal alignment. No acute fracture or dislocation. Mild bilateral degenerative hip arthritis. Advanced vascular calcifications are noted within the pelvis and medial thighs bilaterally. IMPRESSION: No acute abnormality. Mild bilateral degenerative hip arthritis. Peripheral vascular disease. Electronically Signed   By: Fidela Salisbury M.D.   On: 08/24/2021 21:39     ____________________________________________   PROCEDURES  Procedure(s) performed:   Procedures  None  ____________________________________________   INITIAL IMPRESSION / ASSESSMENT AND PLAN / ED COURSE  Pertinent labs & imaging results that were available during my care of the patient were reviewed by me and considered in my medical decision making (see chart for details).   This patient is Presenting for Evaluation of leg pain, which does require a range of treatment options, and is a complaint that involves a high risk of morbidity and mortality.  The Differential Diagnoses include fracture, hip dislocation, muscle spasm, muscle tear, critical limb ischemia.  Critical Interventions- low dose valium and IVF.    Medications  sodium chloride 0.9 % bolus 500 mL (0 mLs Intravenous Stopped 08/24/21 2244)  diazepam (VALIUM) injection 2.5 mg (2.5 mg Intravenous Given 08/24/21 2117)  Reassessment after intervention: Patient feeling much better after IVF and small dose of valium. Leg is extended and he is doing well.    I did obtain Additional Historical Information from patient's wife at bedside.  I decided to review pertinent External Data, and in summary last admit was October 2022 with [facility].   Clinical Laboratory Tests Ordered, included patient with some chronic kidney disease with creatinine of 1.8.  Mild hyperglycemia without evidence of DKA.  He is responding well to IV fluids.  CK and magnesium are normal.  Potassium is normal.  Mild anemia.  No leukocytosis.   Radiologic Tests Ordered, included hip x-ray. I independently interpreted the images and agree with radiology interpretation.   Cardiac Monitor Tracing which shows NSR.  Social Determinants of Health Risk patient is a former smoker.   Medical Decision Making: Summary:  Patient presents to the emergency department by EMS with spasming pain in the left leg.  He has good pulses and do not suspect critical limb  ischemia.  No cellulitis.  No joint effusion or focal pain.  Plain film of the left hip shows no fracture or dislocation.  He had a more vigorous session of exercise today which may have triggered some of the symptoms.  He is responding very well to IV fluids and low-dose Valium.  Feeling much better on my reevaluation.  Labs are reassuring.  Plan for good oral hydration at home and discharge.  Reevaluation with update and discussion with patient and wife. They are comfortable with the plan and feeling much better.   Disposition: discharge  ____________________________________________  FINAL CLINICAL IMPRESSION(S) / ED DIAGNOSES  Final diagnoses:  Left leg pain   Note:  This document was prepared using Dragon voice recognition software and may include unintentional dictation errors.  Nanda Quinton, MD, [facility]    Daemien Fronczak, W[facility], MD 09/02/21 [phone]

## 2021-08-24 NOTE — ED Notes (Signed)
Patient transported to x-ray via stretcher with tech

## 2021-08-24 NOTE — ED Notes (Signed)
ED Provider at bedside. 

## 2021-08-24 NOTE — ED Triage Notes (Signed)
Pt arrives [facility] with c/o of left leg pain starting around noon today.  Pt says pain is in the hamstring and calf region and hurst when he straightens the leg.

## 2021-08-24 NOTE — ED Notes (Signed)
Pt is laying in bed with both legs extended, reporting no pain

## 2021-08-24 NOTE — Discharge Instructions (Signed)
You were seen emergency room today with left leg pain.  I suspect that you had some muscle cramping likely from increased exercise and mild dehydration.  Please continue your exercise routine but be sure to drink plenty of hydrating fluids.  Please follow closely with your primary care doctor.  Return to the emergency department any new or suddenly worsening symptoms.

## 2021-12-05 ENCOUNTER — Telehealth: Payer: Self-pay | Admitting: Cardiology

## 2021-12-05 NOTE — Telephone Encounter (Signed)
Patient was calling to see if the forms for Eliquis had been filled out. Please advise

## 2021-12-05 NOTE — Telephone Encounter (Signed)
Called patient, she states she dropped off paperwork at the office a few weeks ago, just wanted to know if they were sent off.   Advised I would route to primary nurse.  Thanks!

## 2021-12-08 NOTE — Telephone Encounter (Signed)
Spouse calling to inquire about forms for Eliquis.

## 2021-12-08 NOTE — Telephone Encounter (Signed)
   Pt is calling back to f/u. She requested if Timothy Mclean can call her back today

## 2021-12-10 ENCOUNTER — Other Ambulatory Visit: Payer: Self-pay | Admitting: Cardiology

## 2021-12-10 NOTE — Telephone Encounter (Signed)
Ok to give Eliquis 2.63m BID samples while patient assistance is pending

## 2021-12-10 NOTE — Telephone Encounter (Signed)
Pt's wife called in and stated that she has called in multiple times requesting status check on pt assistance and only has 2 days left of eliquis for the pt. I will route to nl anticoag pool to address.

## 2021-12-10 NOTE — Telephone Encounter (Signed)
Prescription refill request for Eliquis received. Indication:Afib Last office visit:12/22 Scr:1.8 Age: 81 Weight:75.8 kg  Prescription refilled

## 2021-12-10 NOTE — Telephone Encounter (Signed)
Spoke with wife. She will pick up eliquis 2.5 mg samples today.

## 2022-04-10 ENCOUNTER — Telehealth: Payer: Self-pay | Admitting: Cardiology

## 2022-04-10 NOTE — Telephone Encounter (Signed)
Yes.  However recommend patient schedule follow up as well.

## 2022-04-10 NOTE — Telephone Encounter (Signed)
Returned call to patients wife (okay per DPR)- advised that 3 boxes of Eliquis 2.14m LBXU:[id number]: 4/24 have been left at the front desk for pick up. Advised her to finish application for patient assistance bring back to office once done. Scheduled patient a follow up appointment for 05/25/22 with Dr. HEllyn Hack Patients wife aware of all instructions and verbalized understanding.

## 2022-04-10 NOTE — Telephone Encounter (Signed)
Pt c/o medication issue:  1. Name of Medication:   ELIQUIS 2.5 MG TABS tablet  2. How are you currently taking this medication (dosage and times per day)?   As prescribed  3. Are you having a reaction (difficulty breathing--STAT)?   No  4. What is your medication issue?    Wife called stating patient would like to get some samples of this medication.

## 2022-05-25 ENCOUNTER — Ambulatory Visit: Payer: PPO | Admitting: Cardiology

## 2022-06-01 ENCOUNTER — Other Ambulatory Visit: Payer: Self-pay | Admitting: Cardiology

## 2022-06-24 ENCOUNTER — Encounter: Payer: Self-pay | Admitting: Physician Assistant

## 2022-06-24 ENCOUNTER — Ambulatory Visit: Payer: PPO | Attending: Cardiology | Admitting: Physician Assistant

## 2022-06-24 VITALS — BP 112/66 | HR 66 | Ht 72.0 in | Wt 165.4 lb

## 2022-06-24 DIAGNOSIS — E785 Hyperlipidemia, unspecified: Secondary | ICD-10-CM

## 2022-06-24 DIAGNOSIS — I10 Essential (primary) hypertension: Secondary | ICD-10-CM

## 2022-06-24 DIAGNOSIS — I4821 Permanent atrial fibrillation: Secondary | ICD-10-CM | POA: Diagnosis not present

## 2022-06-24 DIAGNOSIS — I2510 Atherosclerotic heart disease of native coronary artery without angina pectoris: Secondary | ICD-10-CM | POA: Diagnosis not present

## 2022-06-24 NOTE — Patient Instructions (Signed)
Medication Instructions:  Your physician recommends that you continue on your current medications as directed. Please refer to the Current Medication list given to you today.   *If you need a refill on your cardiac medications before your next appointment, please call your pharmacy*   Lab Work: NONE ordered at this time of appointment   If you have labs (blood work) drawn today and your tests are completely normal, you will receive your results only by: [place] (if you have MyChart) OR A paper copy in the mail If you have any lab test that is abnormal or we need to change your treatment, we will call you to review the results.   Testing/Procedures: NONE ordered at this time of appointment     Follow-Up: At [facility], you and your health needs are our priority.  As part of our continuing mission to provide you with exceptional heart care, we have created designated Provider Care Teams.  These Care Teams include your primary Cardiologist (physician) and Advanced Practice Providers (APPs -  Physician Assistants and Nurse Practitioners) who all work together to provide you with the care you need, when you need it.  We recommend signing up for the patient portal called "MyChart".  Sign up information is provided on this After Visit Summary.  MyChart is used to connect with patients for Virtual Visits (Telemedicine).  Patients are able to view lab/test results, encounter notes, upcoming appointments, etc.  Non-urgent messages can be sent to your provider as well.   To learn more about what you can do with MyChart, go to [email or web address].    Your next appointment:   1 year(s)  The format for your next appointment:   In Person  Provider:   Glenetta Hew, MD  or Caron Presume, PA-C        Other Instructions   Important Information About Sugar

## 2022-06-24 NOTE — Progress Notes (Signed)
Cardiology Office Note:    Date:  06/24/2022   ID:  Timothy Mclean, DOB 1940-12-06, MRN [id number]  PCP:  Derinda Late, [place] Cardiologist: Glenetta Hew, MD   Reason for visit: 1 year follow-up  History of Present Illness:    Timothy Mclean is a 81 y.o. male with a hx of CAD status post PCI and CABG x4 in 1997, chronic atrial fibrillation, chronic bronchitis, COPD, dyslipidemia, GERD.  He was last in our office in December 2022 by Nicholes Rough, PA.  Patient was doing well.  Today, patient continues to feel well.  He denies chest pain, shortness of breath, palpitations, PND, orthopnea, lower extremity edema and bleeding issues with Eliquis.  Wife states patient does not not ambulate as well following COVID infection last year.  He ambulates with a cane.  He sometimes exercises with a stationary bike.       Past Medical History:  Diagnosis Date   Allergy    Anxiety    Asthma    since 2013   Barrett's esophagus 03/09/2014   Bilateral lumbar radiculopathy    since 2013   CAD S/P percutaneous coronary angioplasty 1987   a. 1987 angioplasty and BMS- Cx; b. 1997 CABG x 4;  c. MYOVIEW 1/'05: ? Ischemia vs. artifact --> sent for cath - patent grafts.   Chronic atrial fibrillation ([place])    a. Prev on amiodarone-->d/c 08/2016 in setting of recurrent AF, LFT abnormalities, and pulmonary symptoms;  b. CHA2DS2VASc = 4-->eliquis.   Chronic bronchitis ([place])    since 2007   COPD (chronic obstructive pulmonary disease) (HCC)    DDD (degenerative disc disease)    Diverticulosis    Dyslipidemia, goal LDL below 70    Doing better off of Zocor, currently on Lescol    ED (erectile dysfunction)    Elevated LFTs    Esophageal reflux    GERD (gastroesophageal reflux disease)    Gout, unspecified    Hearing loss    Helicobacter pylori gastritis 07/2009   Herpes zoster 2009   Hypothyroidism    since 2010   Iron deficiency anemia, unspecified    Leg weakness    a. 09/2016  ABI's: R 1.05, L 0.98.   Other and unspecified hyperlipidemia    Other B-complex deficiencies    Personal history of colonic polyps    Prostatitis    chronic ulcerative   Pulmonary fibrosis (HCC)    and bronchiectasis, since 2015   Renal stone    Right   S/P CABG x 4 1997   a) LIMA-LAD, SVG to OM, SVG-dRCA-RPL; b) FALSE + MYOVIEW --> CATH 1/'05: 100% LAD after widelly patent D1, all Cx OM branches 100%, pRCA 100%, SVG-PDA patent w/ retrograde filling of RPL, SVG-OM widely patent ~ normal OM, LIMA-LAD patent.   Spinal stenosis    ST elevation myocardial infarction (STEMI) of inferior wall, subsequent episode of care ([place]) 1987, 1997   a) PTCA of Cx; b) CABG    Type II or unspecified type diabetes mellitus without mention of complication, not stated as uncontrolled    no meds   Ulcerative colitis, unspecified    Unspecified essential hypertension    Vitamin D deficiency     Past Surgical History:  Procedure Laterality Date   BLEPHAROPLASTY Bilateral    for ptosis   CARDIAC CATHETERIZATION  08/17/2003   "False positive stress test " grafts patent; RCA proximal 100% LAD 100% occlusion after normal D1 with 80% ostial as  SP1; circumflex patent but OM1 on to all occluded; EF 50-55%   CARDIAC CATHETERIZATION  11/1995   Preop CABG: LAD 90% at D1, circumflex 100 and OM a 90% proximal, 80% distal   Carotid Duplex Doppler  12/23/2009   Right&Left ICAs 0-49%, mildly abnormal study,    CHOLECYSTECTOMY     COLONOSCOPY     CORONARY ARTERY BYPASS GRAFT  11/1995   LIMA-LAD, SVG to OM, SVG-dRCA-RPL   ESOPHAGOGASTRODUODENOSCOPY     FLEXIBLE SIGMOIDOSCOPY     NM MYOCAR PERF EJECTION FRACTION  07/03/2003   FALSE POSITIVE TEST; Bruce protocol, negative test with scintigraphic evidence of inferoapical scar, diaphragmatic attenuation, moderate ischemia.   NM MYOVIEW LTD  2005   Questionable ischemia that is likely either infarct versus artifact; normal   TRANSTHORACIC ECHOCARDIOGRAM  12/2017   EF  55-60%.  Unable to assess diastolic function due to A. fib.  Moderate LA/RA dilation.  Mild MR.    Current Medications: Current Meds  Medication Sig   ADVAIR DISKUS 250-50 MCG/DOSE AEPB Inhale 1 puff into the lungs 2 (two) times daily.    albuterol (PROVENTIL HFA;VENTOLIN HFA) 108 (90 Base) MCG/ACT inhaler Inhale into the lungs every 6 (six) hours as needed for wheezing or shortness of breath.   balsalazide (COLAZAL) 750 MG capsule TAKE 2 CAPSULES BY MOUTH TWICE DAILY (Patient taking differently: Take 2,250 mg by mouth in the morning and at bedtime.)   Cholecalciferol (VITAMIN D3) 50 MCG (2000 UT) capsule Take 2,000 Units by mouth in the morning and at bedtime.   cyanocobalamin 1000 MCG tablet Take 1,000 mcg by mouth in the morning and at bedtime.   diltiazem (CARDIZEM CD) 180 MG 24 hr capsule TAKE 1 CAPSULE BY MOUTH DAILY   ELIQUIS 2.5 MG TABS tablet TAKE 1 TABLET BY MOUTH TWICE A DAY   ezetimibe (ZETIA) 10 MG tablet Take 10 mg by mouth daily.   finasteride (PROSCAR) 5 MG tablet Take 5 mg by mouth daily.   furosemide (LASIX) 20 MG tablet Take 20 mg by mouth daily.   insulin aspart (NOVOLOG) 100 UNIT/ML injection Inject 6 Units into the skin 3 (three) times daily with meals.   insulin aspart (NOVOLOG) 100 UNIT/ML injection Inject 0-9 Units into the skin 3 (three) times daily with meals. 0-9 Units, Subcutaneous, 3 times daily with meals,  CBG < 70: Implement Hypoglycemia measuresCBG 70 - 120: 0 units CBG 121 - 150: 1 unit CBG 151 - 200: 2 units CBG 201 - 250: 3 units CBG 251 - 300: 5 units CBG 301 - 350: 7 units CBG 351 - 400: 9 units CBG > 400: call MD   insulin detemir (LEVEMIR) 100 UNIT/ML injection Inject 0.15 mLs (15 Units total) into the skin at bedtime.   insulin detemir (LEVEMIR) 100 UNIT/ML injection Inject 0.38 mLs (38 Units total) into the skin every morning.   JANUVIA 100 MG tablet Take 100 mg by mouth daily.   levothyroxine (SYNTHROID) 50 MCG tablet Take 50 mcg by mouth daily.    oxybutynin (DITROPAN) 5 MG tablet Take 5 mg by mouth daily.   pantoprazole (PROTONIX) 40 MG tablet Take 1 tablet by mouth 2 (two) times daily.    rosuvastatin (CRESTOR) 40 MG tablet Take 1 tablet by mouth daily.     Allergies:   Atorvastatin, Fluvastatin, Lisinopril, Metformin and related, Pravastatin, Simvastatin, and Spiriva handihaler [tiotropium bromide monohydrate]   Social History   Socioeconomic History   Marital status: Married    Spouse name: Not  on file   Number of children: 1   Years of education: Not on file   Highest education level: Not on file  Occupational History   Occupation: retired  Tobacco Use   Smoking status: Former    Packs/day: 1.00    Years: 32.00    Total pack years: 32.00    Types: Cigarettes    Quit date: 08/27/1984    Years since quitting: 37.8   Smokeless tobacco: Never  Substance and Sexual Activity   Alcohol use: No   Drug use: No   Sexual activity: Not on file  Other Topics Concern   Not on file  Social History Narrative   He is a married father of one. Does not smoke and does not drink. He walks routinely and also does stationary bike. 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 Currently retired Librarian, academic)   Social Determinants of Radio broadcast assistant Strain: Not on file  Food Insecurity: Not on file  Transportation Needs: Not on file  Physical Activity: Not on file  Stress: Not on file  Social Connections: Not on file     Family History: The patient's family history includes CVA in his sister; Congestive Heart Failure in his mother; Dementia in his father; Diabetes in his mother and sister; Heart disease in his sister; Hypertension in his sister; Lung cancer in his brother; Pneumonia in his father. There is no history of Colon cancer, Esophageal cancer, Rectal cancer, or Stomach cancer.  ROS:   Please see the history of present illness.     EKGs/Labs/Other  Studies Reviewed:    EKG:  The ekg ordered today demonstrates atrial fibrillation with heart rate 66, PVCs, T wave abnormality inferiorly.  Recent Labs: 08/24/2021: ALT 13; BUN 28; Creatinine, Ser 1.80; Hemoglobin 11.8; Magnesium 2.1; Platelets 218; Potassium 4.3; Sodium 133   Recent Lipid Panel No results found for: "CHOL", "TRIG", "HDL", "LDLCALC", "LDLDIRECT"  Physical Exam:    VS:  BP 112/66   Pulse 66   Ht 6' (1.829 m)   Wt 165 lb 6.4 oz (75 kg)   SpO2 92%   BMI 22.43 kg/m    No data found.       Wt Readings from Last 3 Encounters:  06/24/22 165 lb 6.4 oz (75 kg)  08/24/21 167 lb (75.8 kg)  07/04/21 166 lb 3.2 oz (75.4 kg)     GEN:  Well nourished, well developed in no acute distress HEENT: Normal NECK: No JVD; No carotid bruits CARDIAC: Irregular irregular RESPIRATORY:  Clear to auscultation without rales, wheezing or rhonchi  ABDOMEN: Soft, non-tender, non-distended MUSCULOSKELETAL: No edema SKIN: Warm and dry NEUROLOGIC:  Alert and oriented PSYCHIATRIC:  Normal affect     ASSESSMENT AND PLAN   Coronary artery disease, no angina -CABG x 4 in 1997 -Continue statin therapy. -No aspirin given need for anticoagulation.  Permanent atrial fibrillation -Continue Eliquis for stroke prevention (lower dose given age and CKD). -Continue diltiazem for rate control  Hyperlipidemia goal LDL less than 70 -Currently on Crestor 40 mg daily and Zetia 10 mg daily.  I cannot find recent lipids. -He has annual physical with his PCP next month.  Would evaluate if he still needs Zetia.  He was 230 lbs in 2009 -- question if lipids still as elevated given weight loss.  CKD -Patient baseline creatinine?1.2-1.5.  Creatinine was 1.27 August 2021. -Patient is taking Lasix 20 mg daily.  Euvolemic on exam.  Cannot find when this was started.  Echo 22 with EF 60-65%, no valve disease.  Follow-up creatinine next month and evaluate continued need.  Cautious with age, borderline BP and  CKD.  Disposition - Follow-up in 1 year.  Medication Adjustments/Labs and Tests Ordered: Current medicines are reviewed at length with the patient today.  Concerns regarding medicines are outlined above.  Orders Placed This Encounter  Procedures   EKG 12-Lead   No orders of the defined types were placed in this encounter.   Patient Instructions  Medication Instructions:  Your physician recommends that you continue on your current medications as directed. Please refer to the Current Medication list given to you today.   *If you need a refill on your cardiac medications before your next appointment, please call your pharmacy*   Lab Work: NONE ordered at this time of appointment   If you have labs (blood work) drawn today and your tests are completely normal, you will receive your results only by: [place] (if you have MyChart) OR A paper copy in the mail If you have any lab test that is abnormal or we need to change your treatment, we will call you to review the results.   Testing/Procedures: NONE ordered at this time of appointment     Follow-Up: At [facility], you and your health needs are our priority.  As part of our continuing mission to provide you with exceptional heart care, we have created designated Provider Care Teams.  These Care Teams include your primary Cardiologist (physician) and Advanced Practice Providers (APPs -  Physician Assistants and Nurse Practitioners) who all work together to provide you with the care you need, when you need it.  We recommend signing up for the patient portal called "MyChart".  Sign up information is provided on this After Visit Summary.  MyChart is used to connect with patients for Virtual Visits (Telemedicine).  Patients are able to view lab/test results, encounter notes, upcoming appointments, etc.  Non-urgent messages can be sent to your provider as well.   To learn more about what you can do with MyChart, go to  [email or web address].    Your next appointment:   1 year(s)  The format for your next appointment:   In Person  Provider:   Glenetta Hew, MD  or Caron Presume, PA-C        Other Instructions   Important Information About Sugar         Signed, Gaston Islam  06/24/2022 12:17 PM    [place]

## 2022-08-27 LAB — LAB REPORT - SCANNED
A1c: 9.5
EGFR: 49
PSA, Total: 0.217

## 2022-10-07 LAB — LAB REPORT - SCANNED
A1c: 8.9
EGFR: 44

## 2022-10-23 ENCOUNTER — Other Ambulatory Visit: Payer: Self-pay | Admitting: Cardiology

## 2022-10-23 DIAGNOSIS — I4821 Permanent atrial fibrillation: Secondary | ICD-10-CM

## 2022-10-23 NOTE — Telephone Encounter (Addendum)
Prescription refill request for Eliquis received. Indication: a fib Last office visit: 06/24/22 Scr: 1.41 08/24/22 media tab Age: 82  Weight: 75kg

## 2022-12-03 LAB — COMPREHENSIVE METABOLIC PANEL: EGFR: 43

## 2022-12-07 ENCOUNTER — Other Ambulatory Visit (HOSPITAL_COMMUNITY): Payer: Self-pay

## 2022-12-07 MED ORDER — TRULICITY 3 MG/0.5ML ~~LOC~~ SOAJ
3.0000 mg | SUBCUTANEOUS | 0 refills | Status: DC
  Filled 2022-12-07: qty 2, 28d supply, fill #0

## 2022-12-08 ENCOUNTER — Other Ambulatory Visit (HOSPITAL_COMMUNITY): Payer: Self-pay

## 2022-12-16 ENCOUNTER — Other Ambulatory Visit (HOSPITAL_COMMUNITY): Payer: Self-pay

## 2022-12-28 ENCOUNTER — Other Ambulatory Visit: Payer: Self-pay | Admitting: Cardiology

## 2022-12-28 DIAGNOSIS — I4821 Permanent atrial fibrillation: Secondary | ICD-10-CM

## 2022-12-28 NOTE — Telephone Encounter (Signed)
Prescription refill request for Eliquis received. Indication: AF Last office visit:  06/24/22  Verneita Griffes PA-C Scr: 1.8 on 06/24/22 Age: 82 Weight: 75kg  Based on above findings Eliquis 2.5mg  would be the appropriate dose.  Pt is taking 5mg  twice daily.  Pt needs repeat Scr to determine correct dosage.  Will route to Lamar Benes CMA to order labs before refilling Eliquis.

## 2022-12-30 ENCOUNTER — Other Ambulatory Visit (HOSPITAL_COMMUNITY): Payer: Self-pay

## 2022-12-30 MED ORDER — TRULICITY 0.75 MG/0.5ML ~~LOC~~ SOAJ
0.7500 mg | SUBCUTANEOUS | 0 refills | Status: DC
  Filled 2022-12-30: qty 2, 28d supply, fill #0

## 2023-01-11 ENCOUNTER — Telehealth: Payer: Self-pay | Admitting: Cardiology

## 2023-01-11 NOTE — Telephone Encounter (Signed)
Called an d spoke to  Mrs Lipkin    Patient  assistance  will be  sent out tomorrow once signed by Dr Herbie Baltimore  Wife verbalized understanding.

## 2023-01-11 NOTE — Telephone Encounter (Signed)
Pt c/o medication issue:  1. Name of Medication:  apixaban (ELIQUIS) 5 MG TABS tablet   2. How are you currently taking this medication (dosage and times per day)?   3. Are you having a reaction (difficulty breathing--STAT)?   4. What is your medication issue?   Wife states she brought in patient assistance forms for patient to get this medication on 5/15.  Wife stated Chriss Czar said they had not received his application.  Wife wants a call back from RN Jasmine December to follow-up on the status of the patient's application.  Wife stated patient is running out of this medication.

## 2023-01-13 ENCOUNTER — Other Ambulatory Visit (HOSPITAL_COMMUNITY): Payer: Self-pay

## 2023-01-13 MED ORDER — TRULICITY 0.75 MG/0.5ML ~~LOC~~ SOAJ
0.7500 mg | SUBCUTANEOUS | 0 refills | Status: DC
  Filled 2023-01-13 – 2023-01-20 (×2): qty 2, 28d supply, fill #0

## 2023-01-15 ENCOUNTER — Other Ambulatory Visit (HOSPITAL_COMMUNITY): Payer: Self-pay

## 2023-01-18 ENCOUNTER — Other Ambulatory Visit (HOSPITAL_COMMUNITY): Payer: Self-pay

## 2023-01-18 MED ORDER — APIXABAN 5 MG PO TABS: 5.0000 mg | ORAL_TABLET | Freq: Two times a day (BID) | ORAL | 0 refills | Status: DC

## 2023-01-18 NOTE — Telephone Encounter (Signed)
Called  spoke wife   Eliquis  5 mg Samples are available for pick up - 2 weeks worth  Wife states she will pick up 01/20/23

## 2023-01-18 NOTE — Telephone Encounter (Signed)
Late entry - spoke to paten's wife on 01/14/23 patient assistance faxed.  Aware will call when sample are available.

## 2023-01-20 ENCOUNTER — Other Ambulatory Visit: Payer: Self-pay

## 2023-01-20 ENCOUNTER — Other Ambulatory Visit (HOSPITAL_COMMUNITY): Payer: Self-pay

## 2023-02-06 NOTE — Progress Notes (Unsigned)
Cardiology Clinic Note   Date: 02/08/2023 ID: Ac, Colan 1941/01/04, MRN [id number]  Primary Cardiologist:  Bryan Lemma, MD  Patient Profile    Timothy Mclean is a 82 y.o. male who presents to the clinic today for routine follow up.     Past medical history significant for: CAD. LHC 1987 (inferior MI): PTCA LCx.  BMS to LCx several months later.  CABG x 3 1997: LIMA to LAD, SVG to PDA, SVG to OM. LHC 08/17/2003 (abnormal stress test): Widely patent grafts. False positive stress test. Continue medical therapy.  Permanent A-fib. Echo 05/01/2021: EF 60 to 65%.  Diastolic function cannot be evaluated.  Normal RV function.  Moderate LAE.  Mild RAE.  Borderline dilatation of the aortic root 38 mm. Hypertension. Hyperlipidemia. T2DM. Emphysema. Gout. CKD.     History of Present Illness    EMMERT ROETHLER is a longtime patient of cardiology previously seen by Dr. Clarene Duke.  He continues to be followed by Dr. Herbie Baltimore for the above outlined history.  Patient was last in the office by Juanda Crumble, PA-C on 06/24/2022 for routine follow-up.  He was doing well at that time and no changes were made.  Today, patient is accompanied by his wife. He reports he is doing well. Patient denies shortness of breath or dyspnea on exertion. No chest pain, pressure, or tightness. Denies lower extremity edema, orthopnea, or PND. No palpitations. Patient's wife reports patient will occasionally get up in the middle of the night stating that he feels "funny." He denies anything hurts. His wife will check his blood sugar and BP. She notes his BP will sometimes be low in the 90s/50-60. He had one isolated reading on 6/16 of 56/41. He never complains of lightheadedness, dizziness or presyncope. He has balance issues and wife did not notice any increased unsteadiness. He stays well hydrated throughout the day. He has not needed Lasix in over a month. He occasionally rides a stationary bike at home for  exercise. His wife reports "he doesn't do much else" and feels it is because he is unsteady. Discussed changing from using a cane to a walker. They will also see PCP next week for blood work and discuss possible referral to PT to work on balance.     ROS: All other systems reviewed and are otherwise negative except as noted in History of Present Illness.  Studies Reviewed    EKG Interpretation Date/Time:  Monday February 08 2023 08:07:36 EDT Ventricular Rate:  83 PR Interval:    QRS Duration:  94 QT Interval:  376 QTC Calculation: 441 R Axis:   78  Text Interpretation: Atrial fibrillation with premature ventricular or aberrantly conducted complexes Incomplete right bundle branch block ST & T wave abnormality, consider inferior ischemia  No significant change from 06/24/2022 (not in [facility]) Confirmed by Carlos Levering [phone]) on 02/08/2023 8:13:00 AM   Risk Assessment/Calculations     CHA2DS2-VASc Score = 5   This indicates a 7.2% annual risk of stroke. The patient's score is based upon: CHF History: 0 HTN History: 1 Diabetes History: 1 Stroke History: 0 Vascular Disease History: 1 Age Score: 2 Gender Score: 0             Physical Exam    VS:  BP 114/60 (BP Location: Left Arm, Patient Position: Sitting, Cuff Size: Normal)   Pulse 83   Ht 6' (1.829 m)   Wt 150 lb 9.6 oz (68.3 kg)   SpO2 92%  BMI 20.43 kg/m  , BMI Body mass index is 20.43 kg/m.  GEN: Well nourished, well developed, in no acute distress. Neck: No JVD or carotid bruits. Cardiac: Irregular rhythm, controlled rate. No murmurs. No rubs or gallops.   Respiratory:  Respirations regular and unlabored. Clear to auscultation without rales, wheezing or rhonchi. GI: Soft, nontender, nondistended. Extremities: Radials/DP/PT 2+ and equal bilaterally. No clubbing or cyanosis. No edema.  Skin: Warm and dry, no rash. Neuro: Strength intact.  Assessment & Plan    CAD.  S/p CABG x 3 1997.  LHC January 2005 after  false positive stress test showed widely patent grafts. Patient denies chest pain or shortness of breath. He rides a stationary bicycle at home for exercise on occasion. Continue Crestor, Zetia. Permanent A-fib. Patient has no cardiac awareness of arrhythmia. EKG shows afib with HR 83 bpm. Denies spontaneous bleeding.  Continue diltiazem, Eliquis. Appropriate Eliquis dose. Will get recent labs from PCP.  Hypertension: BP today 114/60. Patient denies headaches, dizziness or vision changes. Continue Cardizem. Hyperlipidemia. Patient has labs checked with PCP. Patient's wife believes lipids were checked in January. She will have PCP send them to [place].   Disposition: Will get labs from PCP. Return in 6 months or sooner as needed.          Signed, Etta Grandchild. Avarie Tavano, DNP, NP-C

## 2023-02-08 ENCOUNTER — Ambulatory Visit: Payer: PPO | Attending: Student | Admitting: Student

## 2023-02-08 ENCOUNTER — Encounter: Payer: Self-pay | Admitting: Student

## 2023-02-08 VITALS — BP 114/60 | HR 83 | Ht 72.0 in | Wt 150.6 lb

## 2023-02-08 DIAGNOSIS — I4821 Permanent atrial fibrillation: Secondary | ICD-10-CM

## 2023-02-08 NOTE — Patient Instructions (Signed)
Medication Instructions:  Your physician recommends that you continue on your current medications as directed. Please refer to the Current Medication list given to you today.   *If you need a refill on your cardiac medications before your next appointment, please call your pharmacy*   Lab Work: NO LABS If you have labs (blood work) drawn today and your tests are completely normal, you will receive your results only by: MyChart Message (if you have MyChart) OR A paper copy in the mail If you have any lab test that is abnormal or we need to change your treatment, we will call you to review the results.   Testing/Procedures: NO LABS   Follow-Up: At [facility], you and your health needs are our priority.  As part of our continuing mission to provide you with exceptional heart care, we have created designated Provider Care Teams.  These Care Teams include your primary Cardiologist (physician) and Advanced Practice Providers (APPs -  Physician Assistants and Nurse Practitioners) who all work together to provide you with the care you need, when you need it.  We recommend signing up for the patient portal called "MyChart".  Sign up information is provided on this After Visit Summary.  MyChart is used to connect with patients for Virtual Visits (Telemedicine).  Patients are able to view lab/test results, encounter notes, upcoming appointments, etc.  Non-urgent messages can be sent to your provider as well.   To learn more about what you can do with MyChart, go to [email or web address].    Your next appointment:   6 month(s)  Provider:   Bryan Lemma, MD

## 2023-02-11 LAB — HEMOGLOBIN A1C: A1c: 7.4

## 2023-02-12 LAB — COMPREHENSIVE METABOLIC PANEL: EGFR: 45

## 2023-02-22 ENCOUNTER — Other Ambulatory Visit (HOSPITAL_COMMUNITY): Payer: Self-pay

## 2023-02-22 MED ORDER — TRULICITY 3 MG/0.5ML ~~LOC~~ SOAJ
3.0000 mg | SUBCUTANEOUS | 0 refills | Status: DC
  Filled 2023-02-22: qty 2, 28d supply, fill #0

## 2023-03-11 ENCOUNTER — Other Ambulatory Visit (HOSPITAL_COMMUNITY): Payer: Self-pay

## 2023-03-11 MED ORDER — TRULICITY 3 MG/0.5ML ~~LOC~~ SOAJ
3.0000 mg | SUBCUTANEOUS | 1 refills | Status: DC
  Filled 2023-03-11 – 2023-03-19 (×2): qty 2, 28d supply, fill #0
  Filled 2023-04-19 (×2): qty 2, 28d supply, fill #1

## 2023-03-19 ENCOUNTER — Other Ambulatory Visit (HOSPITAL_COMMUNITY): Payer: Self-pay

## 2023-03-20 ENCOUNTER — Other Ambulatory Visit (HOSPITAL_COMMUNITY): Payer: Self-pay

## 2023-04-19 ENCOUNTER — Other Ambulatory Visit (HOSPITAL_COMMUNITY): Payer: Self-pay

## 2023-04-19 ENCOUNTER — Other Ambulatory Visit: Payer: Self-pay

## 2023-04-20 ENCOUNTER — Other Ambulatory Visit (HOSPITAL_COMMUNITY): Payer: Self-pay

## 2023-04-20 ENCOUNTER — Other Ambulatory Visit: Payer: Self-pay

## 2023-04-21 ENCOUNTER — Other Ambulatory Visit (HOSPITAL_COMMUNITY): Payer: Self-pay

## 2023-05-11 ENCOUNTER — Other Ambulatory Visit (HOSPITAL_COMMUNITY): Payer: Self-pay

## 2023-05-11 ENCOUNTER — Other Ambulatory Visit: Payer: Self-pay

## 2023-05-11 MED ORDER — GLIPIZIDE ER 2.5 MG PO TB24
2.5000 mg | ORAL_TABLET | Freq: Every morning | ORAL | 1 refills | Status: DC
  Filled 2023-05-11 – 2023-06-08 (×2): qty 90, 90d supply, fill #0
  Filled 2023-08-28: qty 90, 90d supply, fill #1

## 2023-05-11 MED ORDER — LEVOTHYROXINE SODIUM 75 MCG PO TABS
75.0000 ug | ORAL_TABLET | Freq: Every day | ORAL | 1 refills | Status: DC
  Filled 2023-05-11 – 2023-05-12 (×2): qty 90, 90d supply, fill #0

## 2023-05-11 MED ORDER — TRULICITY 3 MG/0.5ML ~~LOC~~ SOAJ
3.0000 mg | SUBCUTANEOUS | 5 refills | Status: DC
  Filled 2023-05-11 – 2023-05-12 (×2): qty 2, 28d supply, fill #0
  Filled 2023-06-08: qty 2, 28d supply, fill #1
  Filled 2023-07-06: qty 2, 28d supply, fill #2
  Filled 2023-08-02 (×2): qty 2, 28d supply, fill #3
  Filled 2023-08-28: qty 2, 28d supply, fill #4
  Filled 2023-10-09: qty 2, 28d supply, fill #5

## 2023-05-11 MED ORDER — DILTIAZEM HCL ER COATED BEADS 120 MG PO CP24
120.0000 mg | ORAL_CAPSULE | Freq: Every morning | ORAL | 1 refills | Status: DC
  Filled 2023-05-11 – 2023-06-08 (×2): qty 90, 90d supply, fill #0
  Filled 2023-08-30: qty 90, 90d supply, fill #1

## 2023-05-12 ENCOUNTER — Other Ambulatory Visit (HOSPITAL_COMMUNITY): Payer: Self-pay

## 2023-05-18 ENCOUNTER — Other Ambulatory Visit (HOSPITAL_COMMUNITY): Payer: Self-pay

## 2023-05-21 ENCOUNTER — Other Ambulatory Visit (HOSPITAL_COMMUNITY): Payer: Self-pay

## 2023-06-08 ENCOUNTER — Other Ambulatory Visit (HOSPITAL_COMMUNITY): Payer: Self-pay

## 2023-07-06 ENCOUNTER — Other Ambulatory Visit (HOSPITAL_COMMUNITY): Payer: Self-pay

## 2023-07-19 ENCOUNTER — Other Ambulatory Visit (HOSPITAL_COMMUNITY): Payer: Self-pay

## 2023-08-02 ENCOUNTER — Other Ambulatory Visit (HOSPITAL_COMMUNITY): Payer: Self-pay

## 2023-08-03 ENCOUNTER — Other Ambulatory Visit (HOSPITAL_COMMUNITY): Payer: Self-pay

## 2023-08-10 ENCOUNTER — Ambulatory Visit: Payer: PPO | Attending: Cardiology | Admitting: Cardiology

## 2023-08-10 ENCOUNTER — Encounter: Payer: Self-pay | Admitting: Cardiology

## 2023-08-10 ENCOUNTER — Other Ambulatory Visit (HOSPITAL_COMMUNITY): Payer: Self-pay

## 2023-08-10 VITALS — BP 110/58 | Ht 72.0 in | Wt 150.0 lb

## 2023-08-10 DIAGNOSIS — I2510 Atherosclerotic heart disease of native coronary artery without angina pectoris: Secondary | ICD-10-CM

## 2023-08-10 DIAGNOSIS — Z951 Presence of aortocoronary bypass graft: Secondary | ICD-10-CM

## 2023-08-10 DIAGNOSIS — I2119 ST elevation (STEMI) myocardial infarction involving other coronary artery of inferior wall: Secondary | ICD-10-CM

## 2023-08-10 DIAGNOSIS — E1169 Type 2 diabetes mellitus with other specified complication: Secondary | ICD-10-CM

## 2023-08-10 DIAGNOSIS — E785 Hyperlipidemia, unspecified: Secondary | ICD-10-CM

## 2023-08-10 DIAGNOSIS — I952 Hypotension due to drugs: Secondary | ICD-10-CM

## 2023-08-10 DIAGNOSIS — I10 Essential (primary) hypertension: Secondary | ICD-10-CM | POA: Diagnosis not present

## 2023-08-10 DIAGNOSIS — I4821 Permanent atrial fibrillation: Secondary | ICD-10-CM

## 2023-08-10 MED ORDER — APIXABAN 5 MG PO TABS: 5.0000 mg | ORAL_TABLET | Freq: Two times a day (BID) | ORAL | 0 refills | Status: DC

## 2023-08-10 MED ORDER — APIXABAN 5 MG PO TABS
5.0000 mg | ORAL_TABLET | Freq: Two times a day (BID) | ORAL | 2 refills | Status: DC
  Filled 2023-08-10 – 2023-08-12 (×2): qty 180, 90d supply, fill #0
  Filled 2023-11-23: qty 180, 90d supply, fill #1
  Filled 2024-02-26: qty 180, 90d supply, fill #2

## 2023-08-10 MED ORDER — APIXABAN 5 MG PO TABS: 5.0000 mg | ORAL_TABLET | Freq: Two times a day (BID) | ORAL | 2 refills | Status: DC

## 2023-08-10 NOTE — Patient Instructions (Signed)
Medication Instructions:   Stop taking Rosuvastatin ( Crestor)   *If you need a refill on your cardiac medications before your next appointment, please call your pharmacy*   Lab Work:  Not needed    Testing/Procedures: Not needed   Follow-Up: At [facility], you and your health needs are our priority.  As part of our continuing mission to provide you with exceptional heart care, we have created designated Provider Care Teams.  These Care Teams include your primary Cardiologist (physician) and Advanced Practice Providers (APPs -  Physician Assistants and Nurse Practitioners) who all work together to provide you with the care you need, when you need it.  We recommend signing up for the patient portal called "MyChart".  Sign up information is provided on this After Visit Summary.  MyChart is used to connect with patients for Virtual Visits (Telemedicine).  Patients are able to view lab/test results, encounter notes, upcoming appointments, etc.  Non-urgent messages can be sent to your provider as well.   To learn more about what you can do with MyChart, go to [email or web address].    Your next appointment:   12 month(s)  The format for your next appointment:   In Person  Provider:   Bryan Lemma, MD    Other Instructions

## 2023-08-10 NOTE — Progress Notes (Signed)
Cardiology Office Note:  .   Date:  08/15/2023  ID:  Timothy Mclean, DOB 1941-06-17, MRN [id number] PCP: Timothy Putt, MD  [facility] Providers Cardiologist:  Timothy Lemma, MD     Chief Complaint  Patient presents with   Follow-up    Presumably 64-month, but I have not seen him in over 2 years.   Atrial Fibrillation    No sensation of A-fib   Coronary Artery Disease    No angina    Patient Profile: .     Timothy Mclean is a  83 y.o. male former long-term smoker with a PMH reviewed below who presents here for 36-month follow-up at the request of Timothy Putt, MD.    CAD S/P PTCA-PCI then CABG x4 (Chronic)    ST elevation myocardial infarction (STEMI) of inferior wall, subsequent episode of care (HCC) - Primary (Chronic)   Longstanding persistent atrial fibrillation: CHA2DS2-VASc Score =4, On Eliquis. Diltiazem for rate control. (Chronic)    Essential hypertension (Chronic) - Hypotension due to drugs    Systolic ejection murmur   Now showing signs of Dementia     I last saw Timothy Mclean back in November 2021 for annual follow-up.  At that time he was doing okay just occasional cough.  Not discussed symptoms of A-fib such as irregular heartbeats or exertional dyspnea.  No heart failure symptoms of PND orthopnea or edema.  No chest pain or pressure.  Only noted some exertional dyspnea if he overdid it.  Losing weight persistently.  Also noted memory loss.  Dizziness and poor balance with leg weakness.  No changes made. He has been seen by different APP annually for the last 3 years most recently by Timothy Levering, NP, NP on February 08, 2023 => denied dyspnea at rest exertion.  No chest pain or pressure.  No PND, orthopnea edema.  No palpitations.  Sometimes noted hypotension with dizziness.  Having balance issues and metoprolol.  Unsteady gait. ->  Discussed switching from cane to walker.  Occasionally using stationary bike at home, but not routinely.  Noted labs checked by  PCP.  No changes made.  Subjective  Discussed the use of AI scribe software for clinical note transcription with the patient, who gave verbal consent to proceed.  History of Present Illness   Timothy Mclean, an 83 year old patient with a history of MI-CAD-CABG x 4, permanent (rate controlled) atrial fibrillation, hypertension, and hyperlipidemia, and dementia presents for a routine follow-up. The patient denies any palpitations or irregular heartbeats. There is no reported bleeding, such as hematochezia, hematuria, or epistaxis.  No reports of chest pain / pressure of shortness of breath at rest or at the extent of exertion that he is able to achieve.   No PND, orthopnea or edema.    The patient has been experiencing episodes of dizziness and wooziness, particularly during the night. However, there have been no recent episodes of syncope or near syncope.  Just loss of balance.  His wife is a bit concerned about balance. The patient has been managing mobility with a wheeled walker due to balance issues and weakness. Previously, the patient used a cane but transitioned to a walker for increased stability. The patient has been engaging in some physical activity, including short walks around the house and brief periods on a stationary bike.  Timothy Mclean has been experiencing some memory issues, described as confusion. The patient's appetite has improved after a period of poor eating, although there has been significant weight loss.  The patient denies any chest pain, tightness, or pressure.  There is a noted sore on the patient's right leg, which was biopsied and showed no significant findings. The patient's right foot has been swelling, but there is no reported swelling in the left foot. The patient has been taking a fluid pill as needed for this issue.  Timothy Mclean's medication regimen includes diltiazem, a blood thinner, and Zetia for cholesterol. The patient has been adherent to this regimen. The patient's last blood work  was done recently, but the exact date is not specified.       Objective   Current Meds  Medication Sig   ADVAIR DISKUS 250-50 MCG/DOSE AEPB Inhale 1 puff into the lungs 2 (two) times daily.    albuterol (PROVENTIL HFA;VENTOLIN HFA) 108 (90 Base) MCG/ACT inhaler Inhale into the lungs every 6 (six) hours as needed for wheezing or shortness of breath.   apixaban (ELIQUIS) 5 MG TABS tablet Take 1 tablet (5 mg total) by mouth 2 (two) times daily.   balsalazide (COLAZAL) 750 MG capsule TAKE 2 CAPSULES BY MOUTH TWICE DAILY (Patient taking differently: Take 2,250 mg by mouth in the morning and at bedtime.)   Cholecalciferol (VITAMIN D3) 50 MCG (2000 UT) capsule Take 2,000 Units by mouth in the morning and at bedtime.   cyanocobalamin 1000 MCG tablet Take 1,000 mcg by mouth in the morning and at bedtime.   diltiazem (CARDIZEM CD) 180 MG 24 hr capsule TAKE 1 CAPSULE BY MOUTH DAILY   Dulaglutide (TRULICITY) 3 MG/0.5ML SOAJ Inject 3 mg into the skin once a week.   EPINEPHrine 0.3 mg/0.3 mL IJ SOAJ injection Inject 0.3 mg into the muscle as needed.   ezetimibe (ZETIA) 10 MG tablet Take 10 mg by mouth daily.   finasteride (PROSCAR) 5 MG tablet Take 5 mg by mouth daily.   furosemide (LASIX) 20 MG tablet Take 20 mg by mouth daily. - using PRN (rarely)   glipiZIDE (GLUCOTROL XL) 2.5 MG 24 hr tablet Take 1 tablet (2.5 mg total) by mouth every morning.   insulin aspart (NOVOLOG) 100 UNIT/ML injection Inject 6 Units into the skin 3 (three) times daily with meals.   insulin aspart (NOVOLOG) 100 UNIT/ML injection Inject 0-9 Units into the skin 3 (three) times daily with meals. 0-9 Units, Subcutaneous, 3 times daily with meals,  CBG < 70: Implement Hypoglycemia measuresCBG 70 - 120: 0 units CBG 121 - 150: 1 unit CBG 151 - 200: 2 units CBG 201 - 250: 3 units CBG 251 - 300: 5 units CBG 301 - 350: 7 units CBG 351 - 400: 9 units CBG > 400: call MD   JANUVIA 100 MG tablet Take 100 mg by mouth daily.   levothyroxine  (SYNTHROID) 50 MCG tablet Take 50 mcg by mouth daily.  (Had been 35 mg)   oxybutynin (DITROPAN) 5 MG tablet Take 5 mg by mouth daily.   pantoprazole (PROTONIX) 40 MG tablet Take 1 tablet by mouth 2 (two) times daily.    []  rosuvastatin (CRESTOR) 40 MG tablet Take 1 tablet by mouth daily. => DISCONTINUED TODAY     Studies Reviewed: [name]   EKG Interpretation Date/Time:  Tuesday August 10 2023 11:22:30 EST Ventricular Rate:  85 PR Interval:    QRS Duration:  80 QT Interval:  368 QTC Calculation: 437 R Axis:   43  Text Interpretation: Atrial fibrillation with premature ventricular or aberrantly conducted complexes Nonspecific ST abnormality When compared with ECG of 08-Feb-2023 08:07, T wave inversion  no longer evident in Inferior leads Confirmed by Timothy Mclean ([id number]) on 08/10/2023 11:48:56 AM    Echo 05/01/2021: EF 60 to 65%.  No RWMA.  Moderate LA dilation mild RA dilation.  Normal pressures.  Normal valves.  No new studies No results found for: "CHOL", "HDL", "LDLCALC", "LDLDIRECT", "TRIG", "CHOLHDL" Labs from February 2024: A1c 9.5, TC 98, TG 95, HDL 50, LDL 30. 02/11/2023: A1c 7.4; Cr 1.53, K 4.2, normal LFTs.  Risk Assessment/Calculations:    CHA2DS2-VASc Score = 5   This indicates a 7.2% annual risk of stroke. The patient's score is based upon: CHF History: 0 HTN History: 1 Diabetes History: 1 Stroke History: 0 Vascular Disease History: 1 Age Score: 2 Gender Score: 0   He is on 5 mg twice daily Eliquis      Physical Exam:   VS:  BP (!) 110/58 (Cuff Size: Normal)   Ht 6' (1.829 m)   Wt 150 lb (68 kg)   SpO2 93%   BMI 20.34 kg/m    Wt Readings from Last 3 Encounters:  08/10/23 150 lb (68 kg)  02/08/23 150 lb 9.6 oz (68.3 kg)  06/24/22 165 lb 6.4 oz (75 kg)    GEN: Increasingly frail and chronically ill appearing gentleman.  Very hard of hearing.  Significant kyphosis.  Sitting in wheelchair now (but used walker part-way.  Notable memory loss.  Slow deliberate  speech or does not answer questions routinely.  Wife does most of the talking. NECK: No JVD; No carotid bruits CARDIAC: Irregularly irregular rhythm-rate controlled.  2/6 SEM at RUSB.  Otherwise normal S1, S2; no rubs, gallops RESPIRATORY:  Clear to auscultation without rales, wheezing or rhonchi ; nonlabored, diminished air movement throughout. ABDOMEN: Soft, non-tender, non-distended EXTREMITIES:  No edema; No deformity; slow deliberate gait; Right leg exhibits redness, indicative of healing from previous injury. No significant swelling observed.    ASSESSMENT AND PLAN: .    Problem List Items Addressed This Visit       Cardiology Problems   CAD S/P PTCA-PCI then CABG x4 (Chronic)   MI 1997 with multivessel disease and CABG x 4. Last ischemic evaluation 2005-false positive stress testing.  Cath showing 3 of 3 patent grafts. Echo in 2022 showed normal EF with no RWMA.  This would argue against an occluded graft or significant ischemia. Based on his advanced age, and ongoing signs of progressive dementia, no plans for further ischemic evaluation.   I think a discussion with the patient and his wife would be prudent if he were to start having symptoms, as I do not think that it would be favorable to proceed with cardiac cath. Thankfully, he is not having any active symptoms.  Plan: Continue diltiazem for BP/antianginal effect-diltiazem chosen over beta-blocker because of fatigue and hypotension in the past. Lipids pretty well-controlled on statin, but with memory issues, I plan to stop statin to avoid any potential worsening memory issues. Not on aspirin or Plavix because long-term Eliquis Has a history of hypotension, and therefore not on ACE inhibitor or ARB.       Relevant Medications   apixaban (ELIQUIS) 5 MG TABS tablet   apixaban (ELIQUIS) 5 MG TABS tablet   Essential hypertension (Chronic)   No longer hypertensive.  He is only on low-dose diltiazem ER.  No longer on ACE  inhibitor or ARB and beta-blocker because of fatigue.  No diuretic other than PRN Lasix..      Relevant Medications   apixaban (ELIQUIS) 5 MG TABS  tablet   apixaban (ELIQUIS) 5 MG TABS tablet   Other Relevant Orders   EKG 12-Lead (Completed)   Hyperlipidemia associated with type 2 diabetes mellitus (HCC) (Chronic)   Last lipids were from February 2024.  He is due to have labs checked by PCP soon.  Defer management of diabetes to his primary team, however with his ongoing dementia concerns and memory issues Timothy Mclean ago and stopped his statin.  If his lipids were to go up dramatically, we can consider adding Nexletol to his Zetia.  Plan: Continue Zetia, stop statin.       Relevant Medications   apixaban (ELIQUIS) 5 MG TABS tablet   apixaban (ELIQUIS) 5 MG TABS tablet   Hypotension due to drugs (Chronic)   In the past, we stopped his ARB and have reduced his diltiazem dose.  Overall doing better with less worrisome hypotension issues.      Relevant Medications   apixaban (ELIQUIS) 5 MG TABS tablet   apixaban (ELIQUIS) 5 MG TABS tablet   Permanent atrial fibrillation [facility]): CHA2DS2-VASc Score =4, On Eliquis. Diltiazem for rate control. - Primary (Chronic)   Stable, asymptomatic, on Diltiazem and Eliquis. No reported bleeding or clotting events. -Continue Diltiazem and Eliquis. -Refill Eliquis and diltiazem prescriptions.      Relevant Medications   apixaban (ELIQUIS) 5 MG TABS tablet   apixaban (ELIQUIS) 5 MG TABS tablet   Other Relevant Orders   EKG 12-Lead (Completed)   ST elevation myocardial infarction (STEMI) of inferior wall, subsequent episode of care [facility]) (Chronic)   Distant history of MI back in 1997.  No further issues.  No angina or heart failure.       Relevant Medications   apixaban (ELIQUIS) 5 MG TABS tablet   apixaban (ELIQUIS) 5 MG TABS tablet     Other   S/P CABG x 4 (Chronic)   Distant history of CABG.  No active symptoms.  No plans for follow-up ischemic  evaluation unless symptoms warrant.  Even if he were to have symptoms, would potentially consider medical therapy over invasive.       Memory Issues Reported cognitive decline. Possible medication side effect. -Discontinue Rosuvastatin to assess for potential improvement in memory issues.  Lower Extremity Wound Healing wound on right leg with associated swelling. Likely due to poor circulation. -Advise to elevate feet during the day to reduce swelling. -Monitor wound for signs of infection.  General Health Maintenance -Encourage regular physical activity, such as using a stationary bike or pedal device. -Continue regular follow-up with primary care provider, Dr. Duaine Dredge. -Plan for follow-up in cardiology in 1 year unless issues arise.      Follow-Up: Return in about 1 year (around 08/09/2024) for 1 Yr Follow-up.     Signed, Marykay Lex, MD, MS Timothy Mclean, M.D., M.S. Interventional Cardiologist  [facility]  Pager # [phone] Phone # [phone] [address] [place], [place] [address]

## 2023-08-12 ENCOUNTER — Other Ambulatory Visit (HOSPITAL_COMMUNITY): Payer: Self-pay

## 2023-08-12 MED ORDER — LEVOTHYROXINE SODIUM 75 MCG PO TABS
75.0000 ug | ORAL_TABLET | Freq: Every day | ORAL | 0 refills | Status: DC
  Filled 2023-08-12: qty 90, 90d supply, fill #0

## 2023-08-15 ENCOUNTER — Encounter: Payer: Self-pay | Admitting: Cardiology

## 2023-08-15 NOTE — Assessment & Plan Note (Signed)
No longer hypertensive.  He is only on low-dose diltiazem ER.  No longer on ACE inhibitor or ARB and beta-blocker because of fatigue.  No diuretic other than PRN Lasix.[name]

## 2023-08-15 NOTE — Assessment & Plan Note (Signed)
Distant history of MI back in 1997.  No further issues.  No angina or heart failure.

## 2023-08-15 NOTE — Assessment & Plan Note (Signed)
In the past, we stopped his ARB and have reduced his diltiazem dose.  Overall doing better with less worrisome hypotension issues.

## 2023-08-15 NOTE — Assessment & Plan Note (Signed)
Distant history of CABG.  No active symptoms.  No plans for follow-up ischemic evaluation unless symptoms warrant.  Even if he were to have symptoms, would potentially consider medical therapy over invasive.

## 2023-08-15 NOTE — Assessment & Plan Note (Signed)
MI 1997 with multivessel disease and CABG x 4. Last ischemic evaluation 2005-false positive stress testing.  Cath showing 3 of 3 patent grafts. Echo in 2022 showed normal EF with no RWMA.  This would argue against an occluded graft or significant ischemia. Based on his advanced age, and ongoing signs of progressive dementia, no plans for further ischemic evaluation.   I think a discussion with the patient and his wife would be prudent if he were to start having symptoms, as I do not think that it would be favorable to proceed with cardiac cath. Thankfully, he is not having any active symptoms.  Plan: Continue diltiazem for BP/antianginal effect-diltiazem chosen over beta-blocker because of fatigue and hypotension in the past. Lipids pretty well-controlled on statin, but with memory issues, I plan to stop statin to avoid any potential worsening memory issues. Not on aspirin or Plavix because long-term Eliquis Has a history of hypotension, and therefore not on ACE inhibitor or ARB.

## 2023-08-15 NOTE — Assessment & Plan Note (Signed)
Stable, asymptomatic, on Diltiazem and Eliquis. No reported bleeding or clotting events. -Continue Diltiazem and Eliquis. -Refill Eliquis and diltiazem prescriptions.

## 2023-08-15 NOTE — Assessment & Plan Note (Signed)
Last lipids were from February 2024.  He is due to have labs checked by PCP soon.  Defer management of diabetes to his primary team, however with his ongoing dementia concerns and memory issues [name] ago and stopped his statin.  If his lipids were to go up dramatically, we can consider adding Nexletol to his Zetia.  Plan: Continue Zetia, stop statin.

## 2023-08-28 ENCOUNTER — Other Ambulatory Visit (HOSPITAL_COMMUNITY): Payer: Self-pay

## 2023-08-29 ENCOUNTER — Other Ambulatory Visit: Payer: Self-pay

## 2023-08-30 ENCOUNTER — Other Ambulatory Visit (HOSPITAL_COMMUNITY): Payer: Self-pay

## 2023-09-17 ENCOUNTER — Other Ambulatory Visit: Payer: Self-pay

## 2023-09-17 ENCOUNTER — Other Ambulatory Visit (HOSPITAL_COMMUNITY): Payer: Self-pay

## 2023-09-17 MED ORDER — PANTOPRAZOLE SODIUM 40 MG PO TBEC
40.0000 mg | DELAYED_RELEASE_TABLET | Freq: Two times a day (BID) | ORAL | 3 refills | Status: DC
  Filled 2023-09-17: qty 180, 90d supply, fill #0
  Filled 2023-12-08: qty 180, 90d supply, fill #1
  Filled 2024-03-09: qty 180, 90d supply, fill #2
  Filled 2024-06-12: qty 180, 90d supply, fill #3

## 2023-09-17 MED ORDER — BALSALAZIDE DISODIUM 750 MG PO CAPS
1500.0000 mg | ORAL_CAPSULE | Freq: Two times a day (BID) | ORAL | 3 refills | Status: DC
  Filled 2023-09-17: qty 360, 90d supply, fill #0
  Filled 2023-12-08: qty 360, 90d supply, fill #1
  Filled 2024-03-09: qty 360, 90d supply, fill #2
  Filled 2024-06-12: qty 360, 90d supply, fill #3

## 2023-09-17 MED ORDER — OXYBUTYNIN CHLORIDE 5 MG PO TABS
5.0000 mg | ORAL_TABLET | Freq: Two times a day (BID) | ORAL | 3 refills | Status: DC
  Filled 2023-09-17: qty 180, 90d supply, fill #0
  Filled 2023-12-08: qty 180, 90d supply, fill #1
  Filled 2024-03-09: qty 180, 90d supply, fill #2
  Filled 2024-06-12: qty 180, 90d supply, fill #3

## 2023-09-17 MED ORDER — TRULICITY 3 MG/0.5ML ~~LOC~~ SOAJ
3.0000 mg | SUBCUTANEOUS | 11 refills | Status: DC
  Filled 2023-09-17 – 2023-11-24 (×3): qty 2, 28d supply, fill #0
  Filled ????-??-??: fill #0

## 2023-09-17 MED ORDER — HYDROCOD POLI-CHLORPHE POLI ER 10-8 MG/5ML PO SUER
5.0000 mL | Freq: Two times a day (BID) | ORAL | 0 refills | Status: DC | PRN
  Filled 2023-09-17: qty 60, 6d supply, fill #0

## 2023-09-17 MED ORDER — EZETIMIBE 10 MG PO TABS
10.0000 mg | ORAL_TABLET | Freq: Every day | ORAL | 3 refills | Status: DC
  Filled 2023-09-17: qty 90, 90d supply, fill #0
  Filled 2023-12-08: qty 90, 90d supply, fill #1
  Filled 2024-03-09: qty 90, 90d supply, fill #2
  Filled 2024-06-12: qty 90, 90d supply, fill #3

## 2023-09-17 MED ORDER — DILTIAZEM HCL ER COATED BEADS 120 MG PO CP24
120.0000 mg | ORAL_CAPSULE | Freq: Every morning | ORAL | 3 refills | Status: DC
  Filled 2023-09-17 – 2023-11-23 (×2): qty 90, 90d supply, fill #0
  Filled 2024-02-26: qty 90, 90d supply, fill #1
  Filled 2024-05-24: qty 90, 90d supply, fill #2

## 2023-09-17 MED ORDER — GLIPIZIDE ER 2.5 MG PO TB24
2.5000 mg | ORAL_TABLET | Freq: Every morning | ORAL | 3 refills | Status: DC
  Filled 2023-09-17 – 2023-11-23 (×2): qty 90, 90d supply, fill #0

## 2023-09-17 MED ORDER — EPINEPHRINE 0.3 MG/0.3ML IJ SOAJ
0.3000 mg | INTRAMUSCULAR | 2 refills | Status: DC | PRN
  Filled 2023-09-17: qty 2, 15d supply, fill #0

## 2023-09-17 MED ORDER — LEVOTHYROXINE SODIUM 75 MCG PO TABS
75.0000 ug | ORAL_TABLET | Freq: Every day | ORAL | 3 refills | Status: DC
  Filled 2023-09-17 – 2023-11-08 (×2): qty 90, 90d supply, fill #0
  Filled 2024-02-09: qty 90, 90d supply, fill #1
  Filled 2024-05-02: qty 90, 90d supply, fill #2
  Filled 2024-07-24: qty 90, 90d supply, fill #3

## 2023-09-17 MED ORDER — FLUTICASONE-SALMETEROL 250-50 MCG/ACT IN AEPB
1.0000 | INHALATION_SPRAY | Freq: Two times a day (BID) | RESPIRATORY_TRACT | 3 refills | Status: DC
  Filled 2023-09-17: qty 180, 90d supply, fill #0
  Filled 2023-12-08: qty 180, 90d supply, fill #1
  Filled 2024-07-24: qty 180, 90d supply, fill #2

## 2023-09-17 MED ORDER — ROSUVASTATIN CALCIUM 40 MG PO TABS
40.0000 mg | ORAL_TABLET | Freq: Every evening | ORAL | 3 refills | Status: DC
  Filled 2023-09-17: qty 90, 90d supply, fill #0
  Filled 2023-12-08: qty 90, 90d supply, fill #1
  Filled 2024-03-09: qty 90, 90d supply, fill #2
  Filled 2024-06-12: qty 90, 90d supply, fill #3

## 2023-09-17 MED ORDER — ALBUTEROL SULFATE HFA 108 (90 BASE) MCG/ACT IN AERS
2.0000 | INHALATION_SPRAY | RESPIRATORY_TRACT | 1 refills | Status: DC | PRN
  Filled 2023-09-17: qty 20.1, 51d supply, fill #0

## 2023-09-17 MED ORDER — FINASTERIDE 5 MG PO TABS
5.0000 mg | ORAL_TABLET | Freq: Every day | ORAL | 3 refills | Status: DC
  Filled 2023-09-17: qty 90, 90d supply, fill #0
  Filled 2023-12-08: qty 90, 90d supply, fill #1
  Filled 2024-03-09: qty 90, 90d supply, fill #2
  Filled 2024-06-12: qty 90, 90d supply, fill #3

## 2023-10-09 ENCOUNTER — Other Ambulatory Visit (HOSPITAL_COMMUNITY): Payer: Self-pay

## 2023-10-11 ENCOUNTER — Other Ambulatory Visit (HOSPITAL_COMMUNITY): Payer: Self-pay

## 2023-10-12 ENCOUNTER — Other Ambulatory Visit (HOSPITAL_COMMUNITY): Payer: Self-pay

## 2023-10-13 ENCOUNTER — Other Ambulatory Visit (HOSPITAL_COMMUNITY): Payer: Self-pay

## 2023-10-16 ENCOUNTER — Other Ambulatory Visit (HOSPITAL_BASED_OUTPATIENT_CLINIC_OR_DEPARTMENT_OTHER): Payer: Self-pay

## 2023-10-16 MED ORDER — TRULICITY 3 MG/0.5ML ~~LOC~~ SOAJ
SUBCUTANEOUS | 1 refills | Status: DC
  Filled 2023-10-16 – 2023-11-03 (×2): qty 2, 28d supply, fill #0

## 2023-10-19 ENCOUNTER — Other Ambulatory Visit (HOSPITAL_COMMUNITY): Payer: Self-pay

## 2023-10-20 ENCOUNTER — Other Ambulatory Visit (HOSPITAL_COMMUNITY): Payer: Self-pay

## 2023-11-03 ENCOUNTER — Other Ambulatory Visit: Payer: Self-pay

## 2023-11-08 ENCOUNTER — Other Ambulatory Visit (HOSPITAL_COMMUNITY): Payer: Self-pay

## 2023-11-23 ENCOUNTER — Other Ambulatory Visit (HOSPITAL_COMMUNITY): Payer: Self-pay

## 2023-11-23 ENCOUNTER — Other Ambulatory Visit: Payer: Self-pay

## 2023-11-24 ENCOUNTER — Other Ambulatory Visit: Payer: Self-pay

## 2023-11-24 ENCOUNTER — Other Ambulatory Visit (HOSPITAL_COMMUNITY): Payer: Self-pay

## 2023-12-08 ENCOUNTER — Other Ambulatory Visit (HOSPITAL_COMMUNITY): Payer: Self-pay

## 2023-12-08 ENCOUNTER — Emergency Department (HOSPITAL_BASED_OUTPATIENT_CLINIC_OR_DEPARTMENT_OTHER)

## 2023-12-08 ENCOUNTER — Emergency Department (HOSPITAL_BASED_OUTPATIENT_CLINIC_OR_DEPARTMENT_OTHER)
Admission: EM | Admit: 2023-12-08 | Discharge: 2023-12-08 | Disposition: A | Discharge status: Home / Self Care | Attending: Emergency Medicine | Admitting: Emergency Medicine

## 2023-12-08 ENCOUNTER — Encounter (HOSPITAL_BASED_OUTPATIENT_CLINIC_OR_DEPARTMENT_OTHER): Payer: Self-pay | Admitting: *Deleted

## 2023-12-08 ENCOUNTER — Other Ambulatory Visit: Payer: Self-pay

## 2023-12-08 DIAGNOSIS — R531 Weakness: Secondary | ICD-10-CM | POA: Diagnosis not present

## 2023-12-08 DIAGNOSIS — R009 Unspecified abnormalities of heart beat: Secondary | ICD-10-CM | POA: Diagnosis not present

## 2023-12-08 DIAGNOSIS — Z7901 Long term (current) use of anticoagulants: Secondary | ICD-10-CM | POA: Insufficient documentation

## 2023-12-08 DIAGNOSIS — R410 Disorientation, unspecified: Secondary | ICD-10-CM | POA: Diagnosis not present

## 2023-12-08 DIAGNOSIS — R4182 Altered mental status, unspecified: Secondary | ICD-10-CM | POA: Diagnosis present

## 2023-12-08 LAB — CBC WITH DIFFERENTIAL/PLATELET
Abs Immature Granulocytes: 0.03 10*3/uL (ref 0.00–0.07)
Basophils Absolute: 0 10*3/uL (ref 0.0–0.1)
Basophils Relative: 0 %
Eosinophils Absolute: 0.1 10*3/uL (ref 0.0–0.5)
Eosinophils Relative: 1 %
HCT: 46.2 % (ref 39.0–52.0)
Hemoglobin: 15.5 g/dL (ref 13.0–17.0)
Immature Granulocytes: 0 %
Lymphocytes Relative: 20 %
Lymphs Abs: 1.7 10*3/uL (ref 0.7–4.0)
MCH: 28.8 pg (ref 26.0–34.0)
MCHC: 33.5 g/dL (ref 30.0–36.0)
MCV: 85.9 fL (ref 80.0–100.0)
Monocytes Absolute: 0.6 10*3/uL (ref 0.1–1.0)
Monocytes Relative: 7 %
Neutro Abs: 5.8 10*3/uL (ref 1.7–7.7)
Neutrophils Relative %: 72 %
Platelets: 156 10*3/uL (ref 150–400)
RBC: 5.38 MIL/uL (ref 4.22–5.81)
RDW: 14.4 % (ref 11.5–15.5)
WBC: 8.1 10*3/uL (ref 4.0–10.5)
nRBC: 0 % (ref 0.0–0.2)

## 2023-12-08 LAB — COMPREHENSIVE METABOLIC PANEL WITH GFR
ALT: 20 U/L (ref 0–44)
AST: 45 U/L — ABNORMAL HIGH (ref 15–41)
Albumin: 4.1 g/dL (ref 3.5–5.0)
Alkaline Phosphatase: 86 U/L (ref 38–126)
Anion gap: 12 (ref 5–15)
BUN: 20 mg/dL (ref 8–23)
CO2: 25 mmol/L (ref 22–32)
Calcium: 10 mg/dL (ref 8.9–10.3)
Chloride: 99 mmol/L (ref 98–111)
Creatinine, Ser: 1.37 mg/dL — ABNORMAL HIGH (ref 0.61–1.24)
GFR, Estimated: 51 mL/min — ABNORMAL LOW (ref >60)
Glucose, Bld: 144 mg/dL — ABNORMAL HIGH (ref 70–99)
Potassium: 4.8 mmol/L (ref 3.5–5.1)
Sodium: 136 mmol/L (ref 135–145)
Total Bilirubin: 0.7 mg/dL (ref 0.0–1.2)
Total Protein: 6.7 g/dL (ref 6.5–8.1)

## 2023-12-08 LAB — URINALYSIS, W/ REFLEX TO CULTURE (INFECTION SUSPECTED)
Bacteria, UA: NONE SEEN
Bilirubin Urine: NEGATIVE
Glucose, UA: 100 mg/dL — AB
Hgb urine dipstick: NEGATIVE
Ketones, ur: NEGATIVE mg/dL
Leukocytes,Ua: NEGATIVE
Nitrite: NEGATIVE
Specific Gravity, Urine: 1.01 (ref 1.005–1.030)
pH: 7 (ref 5.0–8.0)

## 2023-12-08 LAB — LACTIC ACID, PLASMA: Lactic Acid, Venous: 1.3 mmol/L (ref 0.5–1.9)

## 2023-12-08 NOTE — ED Provider Notes (Signed)
 [name] [facility] Provider Note   CSN: [id number] Arrival date & time: 12/08/23  0343     History  Chief Complaint  Patient presents with   Altered Mental Status    Timothy Mclean is a 83 y.o. male.  Presents to the emergency department for generally feeling bad.  Wife reports that he seems to be experiencing more confusion than normal at nighttime the last few weeks.  Tonight he woke up and reported that he was feeling very bad, like he was dying.  Wife noted that he was incontinent of urine which is unusual, although he did have urinary incontinence 1 other night this week.  Patient denies pain at arrival.  He reports that he feels very weak.       Home Medications Prior to Admission medications   Medication Sig Start Date End Date Taking? Authorizing Provider  ADVAIR  DISKUS 250-50 MCG/DOSE AEPB Inhale 1 puff into the lungs 2 (two) times daily.  08/16/16   [provider]  albuterol  (PROVENTIL  HFA;VENTOLIN  HFA) 108 (90 Base) MCG/ACT inhaler Inhale into the lungs every 6 (six) hours as needed for wheezing or shortness of breath.    [provider]  albuterol  (VENTOLIN  HFA) 108 (90 Base) MCG/ACT inhaler Inhale 2 puffs into the lungs every 4 (four) hours as needed for chest congestion, cough, shortness of breath, or wheezing 09/16/23     apixaban  (ELIQUIS ) 5 MG TABS tablet Take 1 tablet (5 mg total) by mouth 2 (two) times daily. 08/10/23   Arleen Lacer, MD  apixaban  (ELIQUIS ) 5 MG TABS tablet Take 1 tablet (5 mg total) by mouth 2 (two) times daily. 08/10/23   Arleen Lacer, MD  balsalazide (COLAZAL ) 750 MG capsule TAKE 2 CAPSULES BY MOUTH TWICE DAILY Patient taking differently: Take 2,250 mg by mouth in the morning and at bedtime. 12/05/19   Kenney Peacemaker, MD  balsalazide (COLAZAL ) 750 MG capsule Take 2 capsules (1,500 mg total) by mouth 2 (two) times daily. 09/16/23     Blood Glucose Monitoring Suppl (ONE TOUCH ULTRA 2)  w/Device KIT SMARTSIG:Via Meter 01/27/23   [provider]  chlorpheniramine-HYDROcodone (TUSSIONEX) 10-8 MG/5ML Take 5 mLs by mouth every 12 (twelve) hours as needed for cough 09/16/23     Cholecalciferol (VITAMIN D3) 50 MCG (2000 UT) capsule Take 2,000 Units by mouth in the morning and at bedtime.    [provider]  cyanocobalamin  1000 MCG tablet Take 1,000 mcg by mouth in the morning and at bedtime.    [provider]  diltiazem  (CARDIZEM  CD) 120 MG 24 hr capsule Take 1 capsule (120 mg total) by mouth in the morning. 09/16/23     diltiazem  (CARDIZEM  CD) 180 MG 24 hr capsule TAKE 1 CAPSULE BY MOUTH DAILY 06/01/22   Arleen Lacer, MD  Dulaglutide  (TRULICITY ) 3 MG/0.5ML SOAJ Inject 3 mg into the skin once a week. 05/11/23     Dulaglutide  (TRULICITY ) 3 MG/0.5ML SOAJ Inject 3 mg into the skin once a week. 09/16/23     Dulaglutide  (TRULICITY ) 3 MG/0.5ML SOAJ inject 3 mg by subcutaneous route once weekly 10/15/23     EPINEPHrine  (EPIPEN  2-PAK) 0.3 mg/0.3 mL IJ SOAJ injection Inject 0.3 mg into the muscle as needed for allergic reaction 09/16/23     EPINEPHrine  0.3 mg/0.3 mL IJ SOAJ injection Inject 0.3 mg into the muscle as needed. 09/14/22   [provider]  ezetimibe  (ZETIA ) 10 MG tablet Take 10 mg by  mouth daily. 04/13/21   [provider]  ezetimibe  (ZETIA ) 10 MG tablet Take 1 tablet (10 mg total) by mouth daily. 09/16/23     finasteride  (PROSCAR ) 5 MG tablet Take 5 mg by mouth daily. 04/14/17   [provider]  finasteride  (PROSCAR ) 5 MG tablet Take 1 tablet (5 mg total) by mouth daily. 09/16/23     fluticasone -salmeterol (ADVAIR ) 250-50 MCG/ACT AEPB Inhale 1 puff into the lungs 2 (two) times daily. 09/16/23     furosemide  (LASIX ) 20 MG tablet Take 20 mg by mouth daily. 11/07/18   [provider]  glipiZIDE  (GLUCOTROL  XL) 2.5 MG 24 hr tablet Take 1 tablet (2.5 mg total) by mouth in the morning. 09/16/23     insulin  aspart (NOVOLOG ) 100 UNIT/ML  injection Inject 6 Units into the skin 3 (three) times daily with meals. 05/05/21   Ghimire, Estil Heman, MD  insulin  aspart (NOVOLOG ) 100 UNIT/ML injection Inject 0-9 Units into the skin 3 (three) times daily with meals. 0-9 Units, Subcutaneous, 3 times daily with meals,  CBG < 70: Implement Hypoglycemia measuresCBG 70 - 120: 0 units CBG 121 - 150: 1 unit CBG 151 - 200: 2 units CBG 201 - 250: 3 units CBG 251 - 300: 5 units CBG 301 - 350: 7 units CBG 351 - 400: 9 units CBG > 400: call MD 05/05/21   Burton Casey, MD  JANUVIA 100 MG tablet Take 100 mg by mouth daily. 04/13/21   [provider]  Lancets [facility] DELICA PLUS [place]) MISC  09/28/22   [provider]  levothyroxine  (SYNTHROID ) 50 MCG tablet Take 50 mcg by mouth daily. 03/24/21   [provider]  levothyroxine  (SYNTHROID ) 75 MCG tablet Take 1 tablet (75 mcg total) by mouth daily. 09/16/23     ONETOUCH ULTRA test strip  01/27/23   [provider]  oxybutynin  (DITROPAN ) 5 MG tablet Take 5 mg by mouth daily. 10/02/11   [provider]  oxybutynin  (DITROPAN ) 5 MG tablet Take 1 tablet (5 mg total) by mouth 2 (two) times daily. 09/16/23     pantoprazole  (PROTONIX ) 40 MG tablet Take 1 tablet by mouth 2 (two) times daily.  09/07/14   [provider]  pantoprazole  (PROTONIX ) 40 MG tablet Take 1 tablet (40 mg total) by mouth 2 (two) times daily - 1/2-1 hour before meals 09/16/23     rosuvastatin  (CRESTOR ) 40 MG tablet Take 1 tablet (40 mg total) by mouth every evening after a meal 09/16/23         Allergies    Atorvastatin, Fluvastatin, Lisinopril, Metformin and related, Pravastatin, Simvastatin, and Spiriva handihaler [tiotropium bromide monohydrate]    Review of Systems   Review of Systems  Physical Exam Updated Vital Signs BP (!) 153/94   Pulse (!) 101   Temp (!) 97.5 F (36.4 C) (Oral)   Resp 16   SpO2 96%  Physical Exam Vitals and nursing note reviewed.  Constitutional:       General: He is not in acute distress.    Appearance: He is well-developed.  HENT:     Head: Normocephalic and atraumatic.     Mouth/Throat:     Mouth: Mucous membranes are moist.  Eyes:     General: Vision grossly intact. Gaze aligned appropriately.     Extraocular Movements: Extraocular movements intact.     Conjunctiva/sclera: Conjunctivae normal.  Cardiovascular:     Rate and Rhythm: Normal rate. Rhythm irregular.     Pulses: Normal pulses.  Heart sounds: Normal heart sounds, S1 normal and S2 normal. No murmur heard.    No friction rub. No gallop.  Pulmonary:     Effort: Pulmonary effort is normal. No respiratory distress.     Breath sounds: Normal breath sounds.  Abdominal:     Palpations: Abdomen is soft.     Tenderness: There is no abdominal tenderness. There is no guarding or rebound.     Hernia: No hernia is present.  Musculoskeletal:        General: No swelling.     Cervical back: Full passive range of motion without pain, normal range of motion and neck supple. No pain with movement, spinous process tenderness or muscular tenderness. Normal range of motion.     Right lower leg: No edema.     Left lower leg: No edema.  Skin:    General: Skin is warm and dry.     Capillary Refill: Capillary refill takes less than 2 seconds.     Findings: No ecchymosis, erythema, lesion or wound.  Neurological:     Mental Status: He is alert and oriented to person, place, and time.     GCS: GCS eye subscore is 4. GCS verbal subscore is 5. GCS motor subscore is 6.     Cranial Nerves: Cranial nerves 2-12 are intact.     Sensory: Sensation is intact.     Motor: Motor function is intact. No weakness or abnormal muscle tone.     Coordination: Coordination is intact.  Psychiatric:        Mood and Affect: Mood normal.        Speech: Speech normal.        Behavior: Behavior normal.     ED Results / Procedures / Treatments   Labs (all labs ordered are listed, but only abnormal results  are displayed) Labs Reviewed  COMPREHENSIVE METABOLIC PANEL WITH GFR - Abnormal; Notable for the following components:      Result Value   Glucose, Bld 144 (*)    Creatinine, Ser 1.37 (*)    AST 45 (*)    GFR, Estimated 51 (*)    All other components within normal limits  URINALYSIS, W/ REFLEX TO CULTURE (INFECTION SUSPECTED) - Abnormal; Notable for the following components:   Glucose, UA 100 (*)    Protein, ur TRACE (*)    All other components within normal limits  CBC WITH DIFFERENTIAL/PLATELET  LACTIC ACID, PLASMA    EKG EKG Interpretation Date/Time:  Wednesday Dec 08 2023 03:53:19 EDT Ventricular Rate:  94 PR Interval:    QRS Duration:  104 QT Interval:  405 QTC Calculation: 507 R Axis:   73  Text Interpretation: Atrial fibrillation Abnormal R-wave progression, early transition Borderline repolarization abnormality Prolonged QT interval Confirmed by Ballard Bongo [phone]) on 12/08/2023 3:56:32 AM  Radiology CT HEAD WO CONTRAST ( ) Result Date: 12/08/2023 CLINICAL DATA:  Altered mental status EXAM: CT HEAD WITHOUT CONTRAST TECHNIQUE: Contiguous axial images were obtained from the base of the skull through the vertex without intravenous contrast. RADIATION DOSE REDUCTION: This exam was performed according to the departmental dose-optimization program which includes automated exposure control, adjustment of the mA and/or kV according to patient size and/or use of iterative reconstruction technique. COMPARISON:  None Available. FINDINGS: Brain: Normal anatomic configuration. Parenchymal volume loss is commensurate with the patient's age. Mild periventricular white matter changes are present likely reflecting the sequela of small vessel ischemia. Small remote left occipital cortical infarct. No abnormal intra or  extra-axial mass lesion or fluid collection. No abnormal mass effect or midline shift. No evidence of acute intracranial hemorrhage or infarct. Ventricular size is  normal. Cerebellum unremarkable. Vascular: No asymmetric hyperdense vasculature at the skull base. Skull: Intact Sinuses/Orbits: Paranasal sinuses are clear. Orbits are unremarkable. Other: Mastoid air cells and middle ear cavities are clear. IMPRESSION: 1. No evidence of acute intracranial hemorrhage or infarct. 2. Mild periventricular white matter changes likely reflecting the sequela of small vessel ischemia. Small remote left occipital cortical infarct. Electronically Signed   By: Worthy Heads M.D.   On: 12/08/2023 04:47   DG Chest Port 1 View Result Date: 12/08/2023 CLINICAL DATA:  Increased confusion EXAM: PORTABLE CHEST 1 VIEW COMPARISON:  06/26/2021 FINDINGS: Interstitial coarsening above prior baseline. There is likely underlying emphysema with hyperinflation and apical lucency. No air bronchogram, significant effusion, or pneumothorax. Borderline heart size. Prior CABG. IMPRESSION: Generalized interstitial opacity favoring edema superimposed on emphysema. Electronically Signed   By: Ronnette Coke M.D.   On: 12/08/2023 04:44    Procedures Procedures    Medications Ordered in ED Medications - No data to display  ED Course/ Medical Decision Making/ A&P                                 Medical Decision Making Amount and/or Complexity of Data Reviewed Labs: ordered. Radiology: ordered.   Differential diagnosis considered includes, but not limited to: TIA; Stroke; ICH; Seizure; electrolyte abnormality; hypoglycemia; toxic/pharmacologic causes; CNS infection; psychiatric disorder  Patient presents to the urgency department for evaluation of generalized weakness.  Wife reports that for the last couple of months he has been occasionally waking up at night and seeming confused.  She reports normally he will just go back to sleep but tonight he kept saying that he did not feel well and that he thought he was dying.  At arrival to the emergency department he is in no distress.  Vital  signs are normal other than borderline tachycardia.  He does have a baseline chronic atrial fibrillation.  Patient has had thorough evaluation.  CT head was unremarkable.  Blood work does not show any acute abnormality that would explain his symptoms.  Urinalysis does not suggest infection.  Culture pending.  Vital signs have been monitored he is very slightly hypertensive but otherwise normal.  Heart rate now in the 80s without intervention.  This sounds like a slow decline over time, likely some mild dementia.  Wife counseled that he should follow-up with primary care for this and she is comfortable taking him home.        Final Clinical Impression(s) / ED Diagnoses Final diagnoses:  Confusion  Generalized weakness    Rx / DC Orders ED Discharge Orders     None         Emelynn Rance, Marine Sia, MD 12/08/23 [phone]

## 2023-12-08 NOTE — ED Triage Notes (Signed)
 Pt arrived with wife for not feeling well. Wife reports pt has increased confusion at night, worse in the past couple of weeks. Tonight woke up stating he was dying with 2 episodes of  urinary incontinence. Currently c/o 'feeling funny'

## 2023-12-15 ENCOUNTER — Ambulatory Visit
Admission: RE | Admit: 2023-12-15 | Discharge: 2023-12-15 | Disposition: A | Discharge status: Home / Self Care | Source: Ambulatory Visit | Attending: Family Medicine | Admitting: Family Medicine

## 2023-12-15 ENCOUNTER — Other Ambulatory Visit: Payer: Self-pay | Admitting: Family Medicine

## 2023-12-15 DIAGNOSIS — M25511 Pain in right shoulder: Secondary | ICD-10-CM

## 2023-12-16 ENCOUNTER — Other Ambulatory Visit (HOSPITAL_COMMUNITY): Payer: Self-pay

## 2023-12-16 ENCOUNTER — Other Ambulatory Visit: Payer: Self-pay

## 2023-12-16 MED ORDER — TRULICITY 3 MG/0.5ML ~~LOC~~ SOAJ
3.0000 mg | SUBCUTANEOUS | 2 refills | Status: DC
  Filled 2023-12-16: qty 2, 28d supply, fill #0

## 2023-12-16 MED ORDER — JARDIANCE 10 MG PO TABS
10.0000 mg | ORAL_TABLET | Freq: Every morning | ORAL | 0 refills | Status: DC
  Filled 2023-12-16: qty 30, 30d supply, fill #0

## 2023-12-17 ENCOUNTER — Other Ambulatory Visit (HOSPITAL_COMMUNITY): Payer: Self-pay

## 2024-01-12 ENCOUNTER — Other Ambulatory Visit (HOSPITAL_COMMUNITY): Payer: Self-pay

## 2024-01-12 ENCOUNTER — Other Ambulatory Visit: Payer: Self-pay

## 2024-01-12 MED ORDER — JARDIANCE 10 MG PO TABS
10.0000 mg | ORAL_TABLET | Freq: Every morning | ORAL | 0 refills | Status: DC
  Filled 2024-01-12: qty 30, 30d supply, fill #0

## 2024-01-12 MED ORDER — TRULICITY 1.5 MG/0.5ML ~~LOC~~ SOAJ
1.5000 mg | SUBCUTANEOUS | 0 refills | Status: DC
  Filled 2024-01-12: qty 2, 28d supply, fill #0

## 2024-02-09 ENCOUNTER — Other Ambulatory Visit (HOSPITAL_COMMUNITY): Payer: Self-pay

## 2024-02-09 ENCOUNTER — Other Ambulatory Visit: Payer: Self-pay

## 2024-02-09 MED ORDER — JARDIANCE 10 MG PO TABS
10.0000 mg | ORAL_TABLET | Freq: Every morning | ORAL | 0 refills | Status: DC
  Filled 2024-02-09: qty 30, 30d supply, fill #0

## 2024-02-14 ENCOUNTER — Other Ambulatory Visit (HOSPITAL_COMMUNITY): Payer: Self-pay

## 2024-02-14 MED ORDER — TRULICITY 1.5 MG/0.5ML ~~LOC~~ SOAJ
1.5000 mg | SUBCUTANEOUS | 1 refills | Status: DC
  Filled 2024-02-14: qty 2, 28d supply, fill #0
  Filled 2024-03-09: qty 2, 28d supply, fill #1

## 2024-02-14 MED ORDER — JARDIANCE 25 MG PO TABS
25.0000 mg | ORAL_TABLET | Freq: Every morning | ORAL | 1 refills | Status: DC
  Filled 2024-02-14: qty 30, 30d supply, fill #0
  Filled 2024-03-09: qty 30, 30d supply, fill #1

## 2024-02-15 ENCOUNTER — Other Ambulatory Visit: Payer: Self-pay

## 2024-02-16 ENCOUNTER — Other Ambulatory Visit (HOSPITAL_COMMUNITY): Payer: Self-pay

## 2024-02-26 ENCOUNTER — Other Ambulatory Visit (HOSPITAL_COMMUNITY): Payer: Self-pay

## 2024-03-09 ENCOUNTER — Other Ambulatory Visit: Payer: Self-pay

## 2024-03-09 ENCOUNTER — Other Ambulatory Visit (HOSPITAL_COMMUNITY): Payer: Self-pay

## 2024-03-25 ENCOUNTER — Other Ambulatory Visit (HOSPITAL_COMMUNITY): Payer: Self-pay

## 2024-04-04 ENCOUNTER — Other Ambulatory Visit: Payer: Self-pay

## 2024-04-04 ENCOUNTER — Other Ambulatory Visit (HOSPITAL_COMMUNITY): Payer: Self-pay

## 2024-04-04 MED ORDER — TRULICITY 1.5 MG/0.5ML ~~LOC~~ SOAJ
1.5000 mg | SUBCUTANEOUS | 0 refills | Status: DC
  Filled 2024-04-04: qty 2, 28d supply, fill #0

## 2024-04-10 ENCOUNTER — Other Ambulatory Visit: Payer: Self-pay

## 2024-04-10 ENCOUNTER — Other Ambulatory Visit (HOSPITAL_COMMUNITY): Payer: Self-pay

## 2024-04-10 MED ORDER — JARDIANCE 25 MG PO TABS
25.0000 mg | ORAL_TABLET | Freq: Every morning | ORAL | 0 refills | Status: DC
  Filled 2024-04-10: qty 30, 30d supply, fill #0

## 2024-04-18 ENCOUNTER — Other Ambulatory Visit (HOSPITAL_COMMUNITY): Payer: Self-pay

## 2024-04-18 MED ORDER — TRULICITY 1.5 MG/0.5ML ~~LOC~~ SOAJ
1.5000 mg | SUBCUTANEOUS | 1 refills | Status: DC
  Filled 2024-04-18 – 2024-04-25 (×2): qty 6, 84d supply, fill #0
  Filled 2024-07-14: qty 6, 84d supply, fill #1

## 2024-04-18 MED ORDER — JARDIANCE 25 MG PO TABS
25.0000 mg | ORAL_TABLET | Freq: Every morning | ORAL | 1 refills | Status: DC
  Filled 2024-04-18 – 2024-05-03 (×3): qty 90, 90d supply, fill #0
  Filled 2024-06-16 – 2024-07-24 (×2): qty 90, 90d supply, fill #1

## 2024-04-19 ENCOUNTER — Other Ambulatory Visit: Payer: Self-pay

## 2024-04-19 ENCOUNTER — Other Ambulatory Visit (HOSPITAL_COMMUNITY): Payer: Self-pay

## 2024-04-25 ENCOUNTER — Other Ambulatory Visit (HOSPITAL_COMMUNITY): Payer: Self-pay

## 2024-05-02 ENCOUNTER — Other Ambulatory Visit (HOSPITAL_COMMUNITY): Payer: Self-pay

## 2024-05-03 ENCOUNTER — Other Ambulatory Visit: Payer: Self-pay

## 2024-05-03 ENCOUNTER — Other Ambulatory Visit (HOSPITAL_COMMUNITY): Payer: Self-pay

## 2024-05-22 ENCOUNTER — Other Ambulatory Visit: Payer: Self-pay

## 2024-05-22 ENCOUNTER — Other Ambulatory Visit (HOSPITAL_COMMUNITY): Payer: Self-pay

## 2024-05-22 MED ORDER — TERBINAFINE HCL 250 MG PO TABS
250.0000 mg | ORAL_TABLET | Freq: Every day | ORAL | 1 refills | Status: DC
  Filled 2024-05-22: qty 30, 30d supply, fill #0
  Filled 2024-06-30: qty 30, 30d supply, fill #1

## 2024-05-24 ENCOUNTER — Other Ambulatory Visit: Payer: Self-pay

## 2024-05-24 ENCOUNTER — Other Ambulatory Visit: Payer: Self-pay | Admitting: Cardiology

## 2024-05-24 ENCOUNTER — Other Ambulatory Visit (HOSPITAL_COMMUNITY): Payer: Self-pay

## 2024-05-24 ENCOUNTER — Telehealth: Payer: Self-pay | Admitting: Cardiology

## 2024-05-24 MED ORDER — APIXABAN 5 MG PO TABS
5.0000 mg | ORAL_TABLET | Freq: Two times a day (BID) | ORAL | 0 refills | Status: DC
  Filled 2024-05-24: qty 180, 90d supply, fill #0

## 2024-05-24 NOTE — Telephone Encounter (Signed)
 Prescription refill request for Eliquis  received. Indication:A-Fib Last office visit: 08/10/23 Scr: 1.37 Care Everywhere Age: 83 Weight: 68kg Per Protocol Pt is good on Current Dose

## 2024-05-24 NOTE — Telephone Encounter (Signed)
 RX sent in

## 2024-05-24 NOTE — Telephone Encounter (Signed)
*  STAT* If patient is at the pharmacy, call can be transferred to refill team.   1. Which medications need to be refilled? (please list name of each medication and dose if known) apixaban  (ELIQUIS ) 5 MG TABS tablet    2. Would you like to learn more about the convenience, safety, & potential cost savings by using the [facility]?    3. Are you open to using the [facility] (Type [facility]. ).   4. Which pharmacy/location (including street and city if local pharmacy) is medication to be sent to? [place] [facility]    5. Do they need a 30 day or 90 day supply? 90 day

## 2024-06-12 ENCOUNTER — Other Ambulatory Visit (HOSPITAL_COMMUNITY): Payer: Self-pay

## 2024-06-16 ENCOUNTER — Other Ambulatory Visit (HOSPITAL_COMMUNITY): Payer: Self-pay

## 2024-06-16 ENCOUNTER — Encounter: Payer: Self-pay | Admitting: Emergency Medicine

## 2024-06-16 ENCOUNTER — Ambulatory Visit
Admission: EM | Admit: 2024-06-16 | Discharge: 2024-06-16 | Disposition: A | Discharge status: Home / Self Care | Attending: Family Medicine | Admitting: Family Medicine

## 2024-06-16 DIAGNOSIS — M79601 Pain in right arm: Secondary | ICD-10-CM

## 2024-06-16 DIAGNOSIS — T148XXA Other injury of unspecified body region, initial encounter: Secondary | ICD-10-CM | POA: Diagnosis not present

## 2024-06-16 DIAGNOSIS — W19XXXA Unspecified fall, initial encounter: Secondary | ICD-10-CM

## 2024-06-16 MED ORDER — MUPIROCIN 2 % EX OINT: 1.0000 | TOPICAL_OINTMENT | Freq: Two times a day (BID) | CUTANEOUS | 0 refills | Status: DC

## 2024-06-16 MED ORDER — CHLORHEXIDINE GLUCONATE 4 % EX SOLN: Freq: Every day | CUTANEOUS | 0 refills | Status: DC | PRN

## 2024-06-16 MED ORDER — BACITRACIN 500 UNIT/GM EX OINT
1.0000 | TOPICAL_OINTMENT | Freq: Once | CUTANEOUS | Status: AC
  Administered 2024-06-16: 1 via TOPICAL

## 2024-06-16 NOTE — Discharge Instructions (Signed)
 Clean the area at least once a day with the Hibiclens solution and then apply the mupirocin ointment and nonstick gauze pads.  Use Coban wrap to secure the dressings in place.  Follow-up for worsening or unresolving symptoms.

## 2024-06-16 NOTE — ED Triage Notes (Signed)
 Fell yesterday.  2 skin tears to right forearm and right upper arm.

## 2024-06-16 NOTE — ED Provider Notes (Signed)
 [facility]    CSN: [id number] Arrival date & time: 06/16/24  1046      History   Chief Complaint No chief complaint on file.   HPI Timothy Mclean is a 83 y.o. male.   Patient presenting today with 2 large skin tears to the right arm after a fall yesterday.  His wife who provides all of the history today states he was walking between the kitchen and the dining area with his walker and she thinks he hit his arm on the back of the chair and passed with a walker when he fell down.  He did not lose consciousness and she has noticed no mental status changes, mobility changes, weakness numbness or tingling beyond baseline.  She states he did not hit his head.  She cleaned the area at home yesterday and applied Polysporin and dressings.  She states he is up-to-date on his tetanus shot.  Does take Eliquis  for atrial fibrillation.    Past Medical History:  Diagnosis Date   Allergy    Anxiety    Asthma    since 2013   Barrett's esophagus 03/09/2014   Bilateral lumbar radiculopathy    since 2013   CAD S/P percutaneous coronary angioplasty 1987   a. 1987 angioplasty and BMS- Cx; b. 1997 CABG x 4;  c. MYOVIEW  1/'05: ? Ischemia vs. artifact --> sent for cath - patent grafts.   Chronic atrial fibrillation (HCC)    a. Prev on amiodarone -->d/c 08/2016 in setting of recurrent AF, LFT abnormalities, and pulmonary symptoms;  b. CHA2DS2VASc = 4-->eliquis .   Chronic bronchitis (HCC)    since 2007   COPD (chronic obstructive pulmonary disease) (HCC)    DDD (degenerative disc disease)    Diverticulosis    Dyslipidemia, goal LDL below 70    Doing better off of Zocor, currently on Lescol    ED (erectile dysfunction)    Elevated LFTs    Esophageal reflux    GERD (gastroesophageal reflux disease)    Gout, unspecified    Hearing loss    Helicobacter pylori gastritis 07/2009   Herpes zoster 2009   Hypothyroidism    since 2010   Iron deficiency anemia, unspecified    Leg weakness     a. 09/2016 ABI's: R 1.05, L 0.98.   Other and unspecified hyperlipidemia    Other B-complex deficiencies    Personal history of colonic polyps    Prostatitis    chronic ulcerative   Pulmonary fibrosis (HCC)    and bronchiectasis, since 2015   Renal stone    Right   S/P CABG x 4 1997   a) LIMA-LAD, SVG to OM, SVG-dRCA-RPL; b) FALSE + MYOVIEW  --> CATH 1/'05: 100% LAD after widelly patent D1, all Cx OM branches 100%, pRCA 100%, SVG-PDA patent w/ retrograde filling of RPL, SVG-OM widely patent ~ normal OM, LIMA-LAD patent.   Spinal stenosis    ST elevation myocardial infarction (STEMI) of inferior wall, subsequent episode of care (HCC) 1987, 1997   a) PTCA of Cx; b) CABG    Type II or unspecified type diabetes mellitus without mention of complication, not stated as uncontrolled    no meds   Ulcerative colitis, unspecified    Unspecified essential hypertension    Vitamin D deficiency     Patient Active Problem List   Diagnosis Date Noted   Malnutrition of moderate degree 04/29/2021   Community acquired pneumonia 04/26/2021   PNA (pneumonia) 04/26/2021   COVID-19 virus infection  Leg swelling 11/14/2018   Systolic ejection murmur 11/01/2017   Weakness of lower extremity 09/07/2016   Barrett's esophagus 03/09/2014   Hypotension due to drugs 03/06/2014   Emphysema of lung (HCC) 02/19/2014   Cough 02/19/2014   S/P CABG x 4    Permanent atrial fibrillation [facility]): CHA2DS2-VASc Score =4, On Eliquis . Diltiazem  for rate control.    Hyperlipidemia associated with type 2 diabetes mellitus (HCC)    History of colonic polyps 09/10/2011   UC (ulcerative colitis) (HCC) 09/10/2011   Anticoagulant long-term use 09/10/2011   VITAMIN B12 DEFICIENCY 01/30/2009   Type 2 diabetes mellitus with complication - CAD 10/31/2007   GOUT 10/31/2007   Essential hypertension 10/31/2007   ACID REFLUX DISEASE 10/31/2007   CAD S/P PTCA-PCI then CABG x4 12/01/1995   ST elevation myocardial infarction (STEMI)  of inferior wall, subsequent episode of care [facility]) 10/30/1985    Past Surgical History:  Procedure Laterality Date   BLEPHAROPLASTY Bilateral    for ptosis   CARDIAC CATHETERIZATION  08/17/2003   False positive stress test  grafts patent; RCA proximal 100% LAD 100% occlusion after normal D1 with 80% ostial as SP1; circumflex patent but OM1 on to all occluded; EF 50-55%   CARDIAC CATHETERIZATION  11/1995   Preop CABG: LAD 90% at D1, circumflex 100 and OM a 90% proximal, 80% distal   Carotid Duplex Doppler  12/23/2009   Right&Left ICAs 0-49%, mildly abnormal study,    CHOLECYSTECTOMY     COLONOSCOPY     CORONARY ARTERY BYPASS GRAFT  11/1995   LIMA-LAD, SVG to OM, SVG-dRCA-RPL   ESOPHAGOGASTRODUODENOSCOPY     FLEXIBLE SIGMOIDOSCOPY     NM MYOCAR PERF EJECTION FRACTION  07/03/2003   FALSE POSITIVE TEST; Bruce protocol, negative test with scintigraphic evidence of inferoapical scar, diaphragmatic attenuation, moderate ischemia.   NM MYOVIEW  LTD  2005   Questionable ischemia that is likely either infarct versus artifact; normal   TRANSTHORACIC ECHOCARDIOGRAM  12/2017   EF 55-60%.  Unable to assess diastolic function due to A. fib.  Moderate LA/RA dilation.  Mild MR.       Home Medications    Prior to Admission medications   Medication Sig Start Date End Date Taking? Authorizing Provider  chlorhexidine (HIBICLENS) 4 % external liquid Apply topically daily as needed. 06/16/24  Yes Stuart Vernell Norris, PA-C  mupirocin ointment (BACTROBAN) 2 % Apply 1 Application topically 2 (two) times daily. 06/16/24  Yes Stuart Vernell Norris, PA-C  ADVAIR  DISKUS 250-50 MCG/DOSE AEPB Inhale 1 puff into the lungs 2 (two) times daily.  08/16/16   [provider]  albuterol  (PROVENTIL  HFA;VENTOLIN  HFA) 108 (90 Base) MCG/ACT inhaler Inhale into the lungs every 6 (six) hours as needed for wheezing or shortness of breath.    [provider]  albuterol  (VENTOLIN  HFA) 108 (90 Base) MCG/ACT  inhaler Inhale 2 puffs into the lungs every 4 (four) hours as needed for chest congestion, cough, shortness of breath, or wheezing 09/16/23     apixaban  (ELIQUIS ) 5 MG TABS tablet Take 1 tablet (5 mg total) by mouth 2 (two) times daily. 08/10/23   Anner Alm ORN, MD  apixaban  (ELIQUIS ) 5 MG TABS tablet Take 1 tablet (5 mg total) by mouth 2 (two) times daily. 05/24/24   Anner Alm ORN, MD  balsalazide (COLAZAL ) 750 MG capsule TAKE 2 CAPSULES BY MOUTH TWICE DAILY Patient taking differently: Take 2,250 mg by mouth in the morning and at bedtime. 12/05/19   Avram Lupita BRAVO, MD  balsalazide (COLAZAL ) 750 MG capsule Take 2 capsules (1,500 mg total) by mouth 2 (two) times daily. 09/16/23     Blood Glucose Monitoring Suppl (ONE TOUCH ULTRA 2) w/Device KIT SMARTSIG:Via Meter 01/27/23   [provider]  Cholecalciferol (VITAMIN D3) 50 MCG (2000 UT) capsule Take 2,000 Units by mouth in the morning and at bedtime.    [provider]  cyanocobalamin  1000 MCG tablet Take 1,000 mcg by mouth in the morning and at bedtime.    [provider]  diltiazem  (CARDIZEM  CD) 120 MG 24 hr capsule Take 1 capsule (120 mg total) by mouth in the morning. 09/16/23     diltiazem  (CARDIZEM  CD) 180 MG 24 hr capsule TAKE 1 CAPSULE BY MOUTH DAILY 06/01/22   Anner Alm ORN, MD  Dulaglutide  (TRULICITY ) 1.5 MG/0.5ML SOAJ Inject 1.5 mg as directed once a week. 04/18/24     Dulaglutide  (TRULICITY ) 3 MG/0.5ML SOAJ Inject 3 mg into the skin once a week. 05/11/23     Dulaglutide  (TRULICITY ) 3 MG/0.5ML SOAJ Inject 3 mg into the skin once a week. 09/16/23     Dulaglutide  (TRULICITY ) 3 MG/0.5ML SOAJ inject 3 mg by subcutaneous route once weekly 10/15/23     empagliflozin  (JARDIANCE ) 25 MG TABS tablet Take 1 tablet (25 mg total) by mouth every morning. 04/18/24     EPINEPHrine  (EPIPEN  2-PAK) 0.3 mg/0.3 mL IJ SOAJ injection Inject 0.3 mg into the muscle as needed for allergic reaction 09/16/23     EPINEPHrine  0.3 mg/0.3 mL IJ  SOAJ injection Inject 0.3 mg into the muscle as needed. 09/14/22   [provider]  ezetimibe  (ZETIA ) 10 MG tablet Take 10 mg by mouth daily. 04/13/21   [provider]  ezetimibe  (ZETIA ) 10 MG tablet Take 1 tablet (10 mg total) by mouth daily. 09/16/23     finasteride  (PROSCAR ) 5 MG tablet Take 5 mg by mouth daily. 04/14/17   [provider]  finasteride  (PROSCAR ) 5 MG tablet Take 1 tablet (5 mg total) by mouth daily. 09/16/23     fluticasone -salmeterol (ADVAIR ) 250-50 MCG/ACT AEPB Inhale 1 puff into the lungs 2 (two) times daily. 09/16/23     furosemide  (LASIX ) 20 MG tablet Take 20 mg by mouth daily. 11/07/18   [provider]  insulin  aspart (NOVOLOG ) 100 UNIT/ML injection Inject 6 Units into the skin 3 (three) times daily with meals. 05/05/21   Ghimire, Donalda HERO, MD  insulin  aspart (NOVOLOG ) 100 UNIT/ML injection Inject 0-9 Units into the skin 3 (three) times daily with meals. 0-9 Units, Subcutaneous, 3 times daily with meals,  CBG < 70: Implement Hypoglycemia measuresCBG 70 - 120: 0 units CBG 121 - 150: 1 unit CBG 151 - 200: 2 units CBG 201 - 250: 3 units CBG 251 - 300: 5 units CBG 301 - 350: 7 units CBG 351 - 400: 9 units CBG > 400: call MD 05/05/21   Raenelle Donalda HERO, MD  JANUVIA 100 MG tablet Take 100 mg by mouth daily. 04/13/21   [provider]  Lancets JANETT DELICA PLUS Crum) MISC  09/28/22   [provider]  levothyroxine  (SYNTHROID ) 50 MCG tablet Take 50 mcg by mouth daily. 03/24/21   [provider]  levothyroxine  (SYNTHROID ) 75 MCG tablet Take 1 tablet (75 mcg total) by mouth daily. 09/16/23     ONETOUCH ULTRA test strip  01/27/23   [provider]  oxybutynin  (DITROPAN ) 5 MG tablet Take 5 mg by mouth daily. 10/02/11   [provider]  oxybutynin  (DITROPAN ) 5 MG tablet Take 1 tablet (5 mg total) by mouth 2 (two) times daily. 09/16/23     pantoprazole  (PROTONIX ) 40 MG tablet Take 1 tablet by mouth 2 (two)  times daily.  09/07/14   [provider]  pantoprazole  (PROTONIX ) 40 MG tablet Take 1 tablet (40 mg total) by mouth 2 (two) times daily - 1/2-1 hour before meals 09/16/23     rosuvastatin  (CRESTOR ) 40 MG tablet Take 1 tablet (40 mg total) by mouth every evening after a meal 09/16/23     terbinafine  (LAMISIL ) 250 MG tablet Take 1 tablet (250 mg total) by mouth daily. 05/22/24       Family History Family History  Problem Relation Age of Onset   Diabetes Mother    Congestive Heart Failure Mother    Dementia Father    Pneumonia Father        died of this at age 68, hx of bronchiectasis   Heart disease Sister    Diabetes Sister    Lung cancer Brother        died at age 45   Hypertension Sister    CVA Sister    Colon cancer Neg Hx    Esophageal cancer Neg Hx    Rectal cancer Neg Hx    Stomach cancer Neg Hx     Social History Social History   Tobacco Use   Smoking status: Former    Current packs/day: 0.00    Average packs/day: 1 pack/day for 32.0 years (32.0 ttl pk-yrs)    Types: Cigarettes    Start date: 08/27/1952    Quit date: 08/27/1984    Years since quitting: 39.8   Smokeless tobacco: Never  Substance Use Topics   Alcohol use: No   Drug use: No     Allergies   Atorvastatin, Fluvastatin, Hymenoptera venom preparations, Lisinopril, Metformin and related, Pravastatin, Simvastatin, and Spiriva handihaler [tiotropium bromide]   Review of Systems Review of Systems Per HPI  Physical Exam Triage Vital Signs ED Triage Vitals  Encounter Vitals Group     BP 06/16/24 1052 (!) 94/54     Girls Systolic BP Percentile --      Girls Diastolic BP Percentile --      Boys Systolic BP Percentile --      Boys Diastolic BP Percentile --      Pulse Rate 06/16/24 1052 95     Resp 06/16/24 1052 18     Temp 06/16/24 1053 (!) 97.1 F (36.2 C)     Temp Source 06/16/24 1052 Temporal     SpO2 06/16/24 1052 96 %     Weight --      Height --      Head Circumference --      Peak  Flow --      Pain Score 06/16/24 1057 0     Pain Loc --      Pain Education --      Exclude from Growth Chart --    No data found.  Updated Vital Signs BP (!) 94/54 (BP Location: Left Arm)   Pulse 95   Temp (!) 97.1 F (36.2 C) (Temporal)   Resp 18   SpO2 96%   Visual Acuity Right Eye Distance:   Left Eye Distance:   Bilateral Distance:    Right Eye Near:   Left Eye Near:    Bilateral Near:     Physical Exam Vitals and nursing note reviewed.  Constitutional:  Appearance: Normal appearance.  HENT:     Head: Atraumatic.  Eyes:     Extraocular Movements: Extraocular movements intact.     Conjunctiva/sclera: Conjunctivae normal.  Cardiovascular:     Rate and Rhythm: Normal rate.  Pulmonary:     Effort: Pulmonary effort is normal.  Musculoskeletal:        General: Tenderness and signs of injury present. Normal range of motion.     Cervical back: Normal range of motion and neck supple.     Comments: Range of motion fully intact to the right upper extremity.  No bony deformity palpable  Skin:    General: Skin is warm.     Comments: 2 large superficial skin tears to the right arm, 1 on the forearm and 1 to the posterior upper arm.  No foreign body appreciable, bleeding well-controlled  Neurological:     Mental Status: Mental status is at baseline.     Comments: Right upper extremity appears neurovascularly intact  Psychiatric:        Mood and Affect: Mood normal.        Thought Content: Thought content normal.        Judgment: Judgment normal.      UC Treatments / Results  Labs (all labs ordered are listed, but only abnormal results are displayed) Labs Reviewed - No data to display  EKG   Radiology No results found.  Procedures Procedures (including critical care time)  Medications Ordered in UC Medications  bacitracin ointment 1 Application (1 Application Topical Given 06/16/24 1123)    Initial Impression / Assessment and Plan / UC Course  I  have reviewed the triage vital signs and the nursing notes.  Pertinent labs & imaging results that were available during my care of the patient were reviewed by me and considered in my medical decision making (see chart for details).     Overall well-appearing and in no acute distress.  Good range of motion, x-ray imaging deferred with shared decision making.  Wounds cleaned and dressed with bacitracin and nonstick dressings.  Clean with Hibiclens, apply mupirocin and nonstick dressings at home at least once daily until fully healed.  Declines updating tetanus shot today.  Return for worsening or unresolving symptoms.  Final Clinical Impressions(s) / UC Diagnoses   Final diagnoses:  Skin abrasion  Right arm pain  Fall, initial encounter     Discharge Instructions      Clean the area at least once a day with the Hibiclens solution and then apply the mupirocin ointment and nonstick gauze pads.  Use Coban wrap to secure the dressings in place.  Follow-up for worsening or unresolving symptoms.    ED Prescriptions     Medication Sig Dispense Auth. Provider   chlorhexidine (HIBICLENS) 4 % external liquid Apply topically daily as needed. 236 mL Stuart Vernell Norris, PA-C   mupirocin ointment (BACTROBAN) 2 % Apply 1 Application topically 2 (two) times daily. 60 g Stuart Vernell Norris, [place]      PDMP not reviewed this encounter.   Stuart Vernell Norris, [place] 06/16/24 1127

## 2024-06-22 ENCOUNTER — Other Ambulatory Visit (HOSPITAL_COMMUNITY): Payer: Self-pay

## 2024-06-30 ENCOUNTER — Other Ambulatory Visit (HOSPITAL_COMMUNITY): Payer: Self-pay

## 2024-07-15 ENCOUNTER — Other Ambulatory Visit: Payer: Self-pay

## 2024-07-17 ENCOUNTER — Other Ambulatory Visit (HOSPITAL_COMMUNITY): Payer: Self-pay

## 2024-07-24 ENCOUNTER — Other Ambulatory Visit (HOSPITAL_COMMUNITY): Payer: Self-pay

## 2024-07-26 ENCOUNTER — Inpatient Hospital Stay (HOSPITAL_COMMUNITY)
Admission: EM | Admit: 2024-07-26 | Discharge: 2024-08-02 | DRG: 480 | Disposition: A | Discharge status: Skilled Nursing Facility | Attending: Family Medicine | Admitting: Family Medicine

## 2024-07-26 ENCOUNTER — Emergency Department (HOSPITAL_COMMUNITY)

## 2024-07-26 ENCOUNTER — Encounter (HOSPITAL_COMMUNITY): Payer: Self-pay | Admitting: Internal Medicine

## 2024-07-26 ENCOUNTER — Inpatient Hospital Stay (HOSPITAL_COMMUNITY)

## 2024-07-26 ENCOUNTER — Other Ambulatory Visit: Payer: Self-pay

## 2024-07-26 DIAGNOSIS — E876 Hypokalemia: Secondary | ICD-10-CM | POA: Diagnosis not present

## 2024-07-26 DIAGNOSIS — N1330 Unspecified hydronephrosis: Secondary | ICD-10-CM | POA: Diagnosis present

## 2024-07-26 DIAGNOSIS — E1165 Type 2 diabetes mellitus with hyperglycemia: Secondary | ICD-10-CM | POA: Diagnosis present

## 2024-07-26 DIAGNOSIS — S72141A Displaced intertrochanteric fracture of right femur, initial encounter for closed fracture: Principal | ICD-10-CM | POA: Diagnosis present

## 2024-07-26 DIAGNOSIS — Z7984 Long term (current) use of oral hypoglycemic drugs: Secondary | ICD-10-CM

## 2024-07-26 DIAGNOSIS — Z87442 Personal history of urinary calculi: Secondary | ICD-10-CM

## 2024-07-26 DIAGNOSIS — I2510 Atherosclerotic heart disease of native coronary artery without angina pectoris: Secondary | ICD-10-CM | POA: Diagnosis present

## 2024-07-26 DIAGNOSIS — Y92009 Unspecified place in unspecified non-institutional (private) residence as the place of occurrence of the external cause: Secondary | ICD-10-CM

## 2024-07-26 DIAGNOSIS — K5190 Ulcerative colitis, unspecified, without complications: Secondary | ICD-10-CM | POA: Diagnosis present

## 2024-07-26 DIAGNOSIS — R339 Retention of urine, unspecified: Secondary | ICD-10-CM | POA: Diagnosis present

## 2024-07-26 DIAGNOSIS — I959 Hypotension, unspecified: Secondary | ICD-10-CM | POA: Diagnosis not present

## 2024-07-26 DIAGNOSIS — I951 Orthostatic hypotension: Secondary | ICD-10-CM | POA: Diagnosis present

## 2024-07-26 DIAGNOSIS — E875 Hyperkalemia: Secondary | ICD-10-CM | POA: Diagnosis not present

## 2024-07-26 DIAGNOSIS — Z801 Family history of malignant neoplasm of trachea, bronchus and lung: Secondary | ICD-10-CM

## 2024-07-26 DIAGNOSIS — Z833 Family history of diabetes mellitus: Secondary | ICD-10-CM

## 2024-07-26 DIAGNOSIS — E861 Hypovolemia: Secondary | ICD-10-CM | POA: Diagnosis not present

## 2024-07-26 DIAGNOSIS — I129 Hypertensive chronic kidney disease with stage 1 through stage 4 chronic kidney disease, or unspecified chronic kidney disease: Secondary | ICD-10-CM | POA: Diagnosis present

## 2024-07-26 DIAGNOSIS — M25551 Pain in right hip: Secondary | ICD-10-CM

## 2024-07-26 DIAGNOSIS — J4489 Other specified chronic obstructive pulmonary disease: Secondary | ICD-10-CM | POA: Diagnosis present

## 2024-07-26 DIAGNOSIS — S72001A Fracture of unspecified part of neck of right femur, initial encounter for closed fracture: Secondary | ICD-10-CM | POA: Diagnosis present

## 2024-07-26 DIAGNOSIS — E039 Hypothyroidism, unspecified: Secondary | ICD-10-CM | POA: Diagnosis present

## 2024-07-26 DIAGNOSIS — E1122 Type 2 diabetes mellitus with diabetic chronic kidney disease: Secondary | ICD-10-CM | POA: Diagnosis present

## 2024-07-26 DIAGNOSIS — E871 Hypo-osmolality and hyponatremia: Secondary | ICD-10-CM | POA: Diagnosis present

## 2024-07-26 DIAGNOSIS — J690 Pneumonitis due to inhalation of food and vomit: Secondary | ICD-10-CM | POA: Diagnosis not present

## 2024-07-26 DIAGNOSIS — Z6822 Body mass index (BMI) 22.0-22.9, adult: Secondary | ICD-10-CM

## 2024-07-26 DIAGNOSIS — E785 Hyperlipidemia, unspecified: Secondary | ICD-10-CM | POA: Diagnosis present

## 2024-07-26 DIAGNOSIS — Z860100 Personal history of colon polyps, unspecified: Secondary | ICD-10-CM

## 2024-07-26 DIAGNOSIS — F0390 Unspecified dementia without behavioral disturbance: Secondary | ICD-10-CM | POA: Diagnosis present

## 2024-07-26 DIAGNOSIS — K2270 Barrett's esophagus without dysplasia: Secondary | ICD-10-CM | POA: Diagnosis present

## 2024-07-26 DIAGNOSIS — S72001D Fracture of unspecified part of neck of right femur, subsequent encounter for closed fracture with routine healing: Secondary | ICD-10-CM | POA: Diagnosis not present

## 2024-07-26 DIAGNOSIS — N179 Acute kidney failure, unspecified: Secondary | ICD-10-CM | POA: Diagnosis present

## 2024-07-26 DIAGNOSIS — Z79899 Other long term (current) drug therapy: Secondary | ICD-10-CM

## 2024-07-26 DIAGNOSIS — Z888 Allergy status to other drugs, medicaments and biological substances status: Secondary | ICD-10-CM

## 2024-07-26 DIAGNOSIS — N1831 Chronic kidney disease, stage 3a: Secondary | ICD-10-CM | POA: Diagnosis present

## 2024-07-26 DIAGNOSIS — E860 Dehydration: Secondary | ICD-10-CM | POA: Diagnosis present

## 2024-07-26 DIAGNOSIS — S7221XA Displaced subtrochanteric fracture of right femur, initial encounter for closed fracture: Secondary | ICD-10-CM | POA: Diagnosis present

## 2024-07-26 DIAGNOSIS — Z951 Presence of aortocoronary bypass graft: Secondary | ICD-10-CM

## 2024-07-26 DIAGNOSIS — Z7985 Long-term (current) use of injectable non-insulin antidiabetic drugs: Secondary | ICD-10-CM

## 2024-07-26 DIAGNOSIS — Z87891 Personal history of nicotine dependence: Secondary | ICD-10-CM | POA: Diagnosis not present

## 2024-07-26 DIAGNOSIS — I482 Chronic atrial fibrillation, unspecified: Secondary | ICD-10-CM | POA: Diagnosis present

## 2024-07-26 DIAGNOSIS — E119 Type 2 diabetes mellitus without complications: Secondary | ICD-10-CM

## 2024-07-26 DIAGNOSIS — Z825 Family history of asthma and other chronic lower respiratory diseases: Secondary | ICD-10-CM

## 2024-07-26 DIAGNOSIS — W19XXXA Unspecified fall, initial encounter: Principal | ICD-10-CM

## 2024-07-26 DIAGNOSIS — Z8249 Family history of ischemic heart disease and other diseases of the circulatory system: Secondary | ICD-10-CM

## 2024-07-26 DIAGNOSIS — D62 Acute posthemorrhagic anemia: Secondary | ICD-10-CM | POA: Diagnosis not present

## 2024-07-26 DIAGNOSIS — J8410 Pulmonary fibrosis, unspecified: Secondary | ICD-10-CM | POA: Diagnosis present

## 2024-07-26 DIAGNOSIS — Z7901 Long term (current) use of anticoagulants: Secondary | ICD-10-CM

## 2024-07-26 DIAGNOSIS — Z79890 Hormone replacement therapy (postmenopausal): Secondary | ICD-10-CM

## 2024-07-26 DIAGNOSIS — H9190 Unspecified hearing loss, unspecified ear: Secondary | ICD-10-CM | POA: Diagnosis present

## 2024-07-26 DIAGNOSIS — I10 Essential (primary) hypertension: Secondary | ICD-10-CM | POA: Diagnosis not present

## 2024-07-26 DIAGNOSIS — E559 Vitamin D deficiency, unspecified: Secondary | ICD-10-CM | POA: Diagnosis present

## 2024-07-26 DIAGNOSIS — F419 Anxiety disorder, unspecified: Secondary | ICD-10-CM | POA: Diagnosis present

## 2024-07-26 DIAGNOSIS — D631 Anemia in chronic kidney disease: Secondary | ICD-10-CM | POA: Diagnosis present

## 2024-07-26 DIAGNOSIS — W010XXA Fall on same level from slipping, tripping and stumbling without subsequent striking against object, initial encounter: Secondary | ICD-10-CM | POA: Diagnosis present

## 2024-07-26 DIAGNOSIS — Z794 Long term (current) use of insulin: Secondary | ICD-10-CM | POA: Diagnosis not present

## 2024-07-26 DIAGNOSIS — Z7951 Long term (current) use of inhaled steroids: Secondary | ICD-10-CM

## 2024-07-26 DIAGNOSIS — M109 Gout, unspecified: Secondary | ICD-10-CM | POA: Diagnosis present

## 2024-07-26 DIAGNOSIS — M5416 Radiculopathy, lumbar region: Secondary | ICD-10-CM | POA: Diagnosis present

## 2024-07-26 DIAGNOSIS — K219 Gastro-esophageal reflux disease without esophagitis: Secondary | ICD-10-CM | POA: Diagnosis present

## 2024-07-26 DIAGNOSIS — E43 Unspecified severe protein-calorie malnutrition: Secondary | ICD-10-CM | POA: Diagnosis present

## 2024-07-26 DIAGNOSIS — I252 Old myocardial infarction: Secondary | ICD-10-CM

## 2024-07-26 DIAGNOSIS — Z823 Family history of stroke: Secondary | ICD-10-CM

## 2024-07-26 LAB — I-STAT CHEM 8, ED
BUN: 31 mg/dL — ABNORMAL HIGH (ref 8–23)
Calcium, Ion: 0.99 mmol/L — ABNORMAL LOW (ref 1.15–1.40)
Chloride: 103 mmol/L (ref 98–111)
Creatinine, Ser: 1.5 mg/dL — ABNORMAL HIGH (ref 0.61–1.24)
Glucose, Bld: 166 mg/dL — ABNORMAL HIGH (ref 70–99)
HCT: 46 % (ref 39.0–52.0)
Hemoglobin: 15.6 g/dL (ref 13.0–17.0)
Potassium: 5.3 mmol/L — ABNORMAL HIGH (ref 3.5–5.1)
Sodium: 134 mmol/L — ABNORMAL LOW (ref 135–145)
TCO2: 23 mmol/L (ref 22–32)

## 2024-07-26 LAB — COMPREHENSIVE METABOLIC PANEL WITH GFR
ALT: 23 U/L (ref 0–44)
AST: 45 U/L — ABNORMAL HIGH (ref 15–41)
Albumin: 3.8 g/dL (ref 3.5–5.0)
Alkaline Phosphatase: 122 U/L (ref 38–126)
Anion gap: 12 (ref 5–15)
BUN: 22 mg/dL (ref 8–23)
CO2: 20 mmol/L — ABNORMAL LOW (ref 22–32)
Calcium: 9.4 mg/dL (ref 8.9–10.3)
Chloride: 101 mmol/L (ref 98–111)
Creatinine, Ser: 1.4 mg/dL — ABNORMAL HIGH (ref 0.61–1.24)
GFR, Estimated: 49 mL/min — ABNORMAL LOW
Glucose, Bld: 162 mg/dL — ABNORMAL HIGH (ref 70–99)
Potassium: 4.2 mmol/L (ref 3.5–5.1)
Sodium: 133 mmol/L — ABNORMAL LOW (ref 135–145)
Total Bilirubin: 0.5 mg/dL (ref 0.0–1.2)
Total Protein: 6.8 g/dL (ref 6.5–8.1)

## 2024-07-26 LAB — TYPE AND SCREEN
ABO/RH(D): A POS
Antibody Screen: NEGATIVE

## 2024-07-26 LAB — ETHANOL: Alcohol, Ethyl (B): <15 mg/dL

## 2024-07-26 LAB — CBC
HCT: 44.9 % (ref 39.0–52.0)
Hemoglobin: 15.1 g/dL (ref 13.0–17.0)
MCH: 30.4 pg (ref 26.0–34.0)
MCHC: 33.6 g/dL (ref 30.0–36.0)
MCV: 90.5 fL (ref 80.0–100.0)
Platelets: 241 K/uL (ref 150–400)
RBC: 4.96 MIL/uL (ref 4.22–5.81)
RDW: 14.2 % (ref 11.5–15.5)
WBC: 8.1 K/uL (ref 4.0–10.5)
nRBC: 0 % (ref 0.0–0.2)

## 2024-07-26 LAB — BASIC METABOLIC PANEL WITH GFR
Anion gap: 11 (ref 5–15)
BUN: 24 mg/dL — ABNORMAL HIGH (ref 8–23)
CO2: 23 mmol/L (ref 22–32)
Calcium: 9.4 mg/dL (ref 8.9–10.3)
Chloride: 101 mmol/L (ref 98–111)
Creatinine, Ser: 1.46 mg/dL — ABNORMAL HIGH (ref 0.61–1.24)
GFR, Estimated: 47 mL/min — ABNORMAL LOW
Glucose, Bld: 220 mg/dL — ABNORMAL HIGH (ref 70–99)
Potassium: 4.6 mmol/L (ref 3.5–5.1)
Sodium: 135 mmol/L (ref 135–145)

## 2024-07-26 LAB — GLUCOSE, CAPILLARY
Glucose-Capillary: 226 mg/dL — ABNORMAL HIGH (ref 70–99)
Glucose-Capillary: 245 mg/dL — ABNORMAL HIGH (ref 70–99)

## 2024-07-26 LAB — ABO/RH: ABO/RH(D): A POS

## 2024-07-26 LAB — PROTIME-INR
INR: 1.3 — ABNORMAL HIGH (ref 0.8–1.2)
Prothrombin Time: 17.1 s — ABNORMAL HIGH (ref 11.4–15.2)

## 2024-07-26 LAB — SAMPLE TO BLOOD BANK

## 2024-07-26 LAB — I-STAT CG4 LACTIC ACID, ED: Lactic Acid, Venous: 1.6 mmol/L (ref 0.5–1.9)

## 2024-07-26 MED ORDER — LEVOTHYROXINE SODIUM 75 MCG PO TABS
75.0000 ug | ORAL_TABLET | Freq: Every day | ORAL | Status: DC
  Administered 2024-07-27 – 2024-08-02 (×7): 75 ug via ORAL
  Filled 2024-07-26 (×7): qty 1

## 2024-07-26 MED ORDER — CHLORHEXIDINE GLUCONATE CLOTH 2 % EX PADS
6.0000 | MEDICATED_PAD | Freq: Every day | CUTANEOUS | Status: DC
  Administered 2024-07-26 – 2024-08-02 (×8): 6 via TOPICAL

## 2024-07-26 MED ORDER — PANTOPRAZOLE SODIUM 40 MG IV SOLR
40.0000 mg | Freq: Two times a day (BID) | INTRAVENOUS | Status: DC
  Administered 2024-07-26: 40 mg via INTRAVENOUS
  Filled 2024-07-26: qty 10

## 2024-07-26 MED ORDER — POLYETHYLENE GLYCOL 3350 17 G PO PACK: 17.0000 g | PACK | Freq: Every day | ORAL | Status: AC | PRN

## 2024-07-26 MED ORDER — DILTIAZEM HCL ER COATED BEADS 120 MG PO CP24
120.0000 mg | ORAL_CAPSULE | Freq: Every morning | ORAL | Status: AC
  Administered 2024-07-27 – 2024-08-02 (×7): 120 mg via ORAL
  Filled 2024-07-26 (×8): qty 1

## 2024-07-26 MED ORDER — SODIUM CHLORIDE 0.9 % IV SOLN: INTRAVENOUS | Status: AC

## 2024-07-26 MED ORDER — FLUTICASONE FUROATE-VILANTEROL 200-25 MCG/ACT IN AEPB
1.0000 | INHALATION_SPRAY | Freq: Every day | RESPIRATORY_TRACT | Status: AC
  Administered 2024-07-28 – 2024-08-02 (×6): 1 via RESPIRATORY_TRACT
  Filled 2024-07-26: qty 28

## 2024-07-26 MED ORDER — METHOCARBAMOL 1000 MG/10ML IJ SOLN: 500.0000 mg | Freq: Four times a day (QID) | INTRAMUSCULAR | Status: DC | PRN

## 2024-07-26 MED ORDER — BISACODYL 5 MG PO TBEC: 5.0000 mg | DELAYED_RELEASE_TABLET | Freq: Every day | ORAL | Status: DC | PRN

## 2024-07-26 MED ORDER — BUDESONIDE-FORMOTEROL FUMARATE 160-4.5 MCG/ACT IN AERO: 2.0000 | INHALATION_SPRAY | Freq: Two times a day (BID) | RESPIRATORY_TRACT | Status: DC

## 2024-07-26 MED ORDER — ALBUTEROL SULFATE (2.5 MG/3ML) 0.083% IN NEBU: 2.5000 mg | INHALATION_SOLUTION | RESPIRATORY_TRACT | Status: AC | PRN

## 2024-07-26 MED ORDER — FINASTERIDE 5 MG PO TABS
5.0000 mg | ORAL_TABLET | Freq: Every day | ORAL | Status: DC
  Administered 2024-07-26 – 2024-07-27 (×2): 5 mg via ORAL
  Filled 2024-07-26 (×2): qty 1

## 2024-07-26 MED ORDER — FENTANYL CITRATE (PF) 50 MCG/ML IJ SOSY
50.0000 ug | PREFILLED_SYRINGE | Freq: Once | INTRAMUSCULAR | Status: AC
  Administered 2024-07-26: 50 ug via INTRAVENOUS
  Filled 2024-07-26: qty 1

## 2024-07-26 MED ORDER — METHOCARBAMOL 500 MG PO TABS: 500.0000 mg | ORAL_TABLET | Freq: Four times a day (QID) | ORAL | Status: DC | PRN

## 2024-07-26 MED ORDER — BALSALAZIDE DISODIUM 750 MG PO CAPS: 1500.0000 mg | ORAL_CAPSULE | Freq: Two times a day (BID) | ORAL | Status: DC

## 2024-07-26 MED ORDER — PANTOPRAZOLE SODIUM 40 MG PO TBEC: 40.0000 mg | DELAYED_RELEASE_TABLET | Freq: Two times a day (BID) | ORAL | Status: DC

## 2024-07-26 MED ORDER — MORPHINE SULFATE (PF) 2 MG/ML IV SOLN: 2.0000 mg | INTRAVENOUS | Status: AC | PRN

## 2024-07-26 MED ORDER — HYDROCODONE-ACETAMINOPHEN 5-325 MG PO TABS
1.0000 | ORAL_TABLET | Freq: Four times a day (QID) | ORAL | Status: AC | PRN
  Administered 2024-07-26: 1 via ORAL
  Administered 2024-07-27 – 2024-07-28 (×2): 2 via ORAL
  Administered 2024-07-28: 1 via ORAL
  Administered 2024-07-28 – 2024-08-01 (×4): 2 via ORAL
  Administered 2024-08-01: 1 via ORAL
  Administered 2024-08-02: 2 via ORAL
  Filled 2024-07-26 (×4): qty 1
  Filled 2024-07-26 (×4): qty 2
  Filled 2024-07-26: qty 1
  Filled 2024-07-26 (×2): qty 2

## 2024-07-26 MED ORDER — IOHEXOL 350 MG/ML SOLN
75.0000 mL | Freq: Once | INTRAVENOUS | Status: AC | PRN
  Administered 2024-07-26: 75 mL via INTRAVENOUS

## 2024-07-26 NOTE — H&P (Signed)
 " History and Physical    Patient: Timothy Mclean FMW:[id number] DOB: 05/10/1941 DOA: 07/26/2024 DOS: the patient was seen and examined on 07/26/2024 . PCP: Windy Coy, MD  Patient coming from: Home Chief complaint: Chief Complaint  Patient presents with   Fall   HPI:  Timothy Mclean is a 84 y.o. male with past medical history  of dementia with history of delirium on Seroquel , asthma, COPD, chronic A-fib on Eliquis , STEMI, CAD status post CABG, most recent echo in 2022 showing EF of 60 to 65% aortic borderline dilatation at the root, CKD stage II, diabetes mellitus type 2, GERD and Barrett's esophagus, hypothyroidism, came in for a fall today.seen in ed as level 2 trauma, came in via EMS for  mechanical fall while walking and turning around. Wife at bedside states that he ambulates with walker and has dementia and hearing loss and wears a hearing aid.  At bedside patient is awake cooperative but disoriented similar to his baseline dementia per wife.  ED Course:  Vital signs in the ED were notable for the following:  Vitals:   07/26/24 1421 07/26/24 1424 07/26/24 1823 07/26/24 1956  BP:   (!) 147/92 (!) 132/37  Pulse:   96 (!) 105  Temp:   98.5 F (36.9 C) 98.4 F (36.9 C)  Resp:    16  Height: 6' (1.829 m)     Weight: 74.8 kg     SpO2:  98% 98% 95%  TempSrc:   Oral Oral  BMI (Calculated): 22.37      >>ED evaluation thus far shows: CMP shows sodium 133 bicarb of 20 creatinine 1.4 eGFR 49, AST of 45. CBC is wnl. INR 1.3 CT head is negative for acute intracranial abnormality, atrophy and chronic small vessel ischemic change of white matter.  Ct C spine show no acute osseus abnormality.  Comminuted nondisplaced right femoral fracture intertrochanteric associated proximal diaphyseal fracture, Distended bladder , large colonic stool burden.    >>While in the ED patient received the following: Medications  fentaNYL  (SUBLIMAZE ) injection 50 mcg (50 mcg Intravenous Given 07/26/24 1611)   iohexol  (OMNIPAQUE ) 350 MG/ML injection 75 mL (75 mLs Intravenous Contrast Given 07/26/24 1609)   Review of Systems  Musculoskeletal:  Positive for falls and joint pain.   Past Medical History:  Diagnosis Date   Allergy    Anxiety    Asthma    since 2013   Barrett's esophagus 03/09/2014   Bilateral lumbar radiculopathy    since 2013   CAD S/P percutaneous coronary angioplasty 1987   a. 1987 angioplasty and BMS- Cx; b. 1997 CABG x 4;  c. MYOVIEW  1/'05: ? Ischemia vs. artifact --> sent for cath - patent grafts.   Chronic atrial fibrillation (HCC)    a. Prev on amiodarone -->d/c 08/2016 in setting of recurrent AF, LFT abnormalities, and pulmonary symptoms;  b. CHA2DS2VASc = 4-->eliquis .   Chronic bronchitis (HCC)    since 2007   COPD (chronic obstructive pulmonary disease) (HCC)    DDD (degenerative disc disease)    Diverticulosis    Dyslipidemia, goal LDL below 70    Doing better off of Zocor, currently on Lescol    ED (erectile dysfunction)    Elevated LFTs    Esophageal reflux    GERD (gastroesophageal reflux disease)    Gout, unspecified    Hearing loss    Helicobacter pylori gastritis 07/2009   Herpes zoster 2009   Hypothyroidism    since 2010   Iron deficiency anemia,  unspecified    Leg weakness    a. 09/2016 ABI's: R 1.05, L 0.98.   Other and unspecified hyperlipidemia    Other B-complex deficiencies    Personal history of colonic polyps    Prostatitis    chronic ulcerative   Pulmonary fibrosis (HCC)    and bronchiectasis, since 2015   Renal stone    Right   S/P CABG x 4 1997   a) LIMA-LAD, SVG to OM, SVG-dRCA-RPL; b) FALSE + MYOVIEW  --> CATH 1/'05: 100% LAD after widelly patent D1, all Cx OM branches 100%, pRCA 100%, SVG-PDA patent w/ retrograde filling of RPL, SVG-OM widely patent ~ normal OM, LIMA-LAD patent.   Spinal stenosis    ST elevation myocardial infarction (STEMI) of inferior wall, subsequent episode of care (HCC) 1987, 1997   a) PTCA of Cx; b) CABG     Type II or unspecified type diabetes mellitus without mention of complication, not stated as uncontrolled    no meds   Ulcerative colitis, unspecified    Unspecified essential hypertension    Vitamin D deficiency    Past Surgical History:  Procedure Laterality Date   BLEPHAROPLASTY Bilateral    for ptosis   CARDIAC CATHETERIZATION  08/17/2003   False positive stress test  grafts patent; RCA proximal 100% LAD 100% occlusion after normal D1 with 80% ostial as SP1; circumflex patent but OM1 on to all occluded; EF 50-55%   CARDIAC CATHETERIZATION  11/1995   Preop CABG: LAD 90% at D1, circumflex 100 and OM a 90% proximal, 80% distal   Carotid Duplex Doppler  12/23/2009   Right&Left ICAs 0-49%, mildly abnormal study,    CHOLECYSTECTOMY     COLONOSCOPY     CORONARY ARTERY BYPASS GRAFT  11/1995   LIMA-LAD, SVG to OM, SVG-dRCA-RPL   ESOPHAGOGASTRODUODENOSCOPY     FLEXIBLE SIGMOIDOSCOPY     NM MYOCAR PERF EJECTION FRACTION  07/03/2003   FALSE POSITIVE TEST; Bruce protocol, negative test with scintigraphic evidence of inferoapical scar, diaphragmatic attenuation, moderate ischemia.   NM MYOVIEW  LTD  2005   Questionable ischemia that is likely either infarct versus artifact; normal   TRANSTHORACIC ECHOCARDIOGRAM  12/2017   EF 55-60%.  Unable to assess diastolic function due to A. fib.  Moderate LA/RA dilation.  Mild MR.    reports that he quit smoking about 39 years ago. His smoking use included cigarettes. He started smoking about 71 years ago. He has a 32 pack-year smoking history. He has never used smokeless tobacco. He reports that he does not drink alcohol and does not use drugs. Allergies[1] Family History  Problem Relation Age of Onset   Diabetes Mother    Congestive Heart Failure Mother    Dementia Father    Pneumonia Father        died of this at age 98, hx of bronchiectasis   Heart disease Sister    Diabetes Sister    Lung cancer Brother        died at age 98   Hypertension  Sister    CVA Sister    Colon cancer Neg Hx    Esophageal cancer Neg Hx    Rectal cancer Neg Hx    Stomach cancer Neg Hx    Prior to Admission medications  Medication Sig Start Date End Date Taking? Authorizing Provider  ADVAIR  DISKUS 250-50 MCG/DOSE AEPB Inhale 1 puff into the lungs 2 (two) times daily.  08/16/16   [provider]  albuterol  (PROVENTIL  HFA;VENTOLIN  HFA) 108 (  90 Base) MCG/ACT inhaler Inhale into the lungs every 6 (six) hours as needed for wheezing or shortness of breath.    [provider]  albuterol  (VENTOLIN  HFA) 108 (90 Base) MCG/ACT inhaler Inhale 2 puffs into the lungs every 4 (four) hours as needed for chest congestion, cough, shortness of breath, or wheezing 09/16/23     apixaban  (ELIQUIS ) 5 MG TABS tablet Take 1 tablet (5 mg total) by mouth 2 (two) times daily. 08/10/23   Anner Alm ORN, MD  apixaban  (ELIQUIS ) 5 MG TABS tablet Take 1 tablet (5 mg total) by mouth 2 (two) times daily. 05/24/24   Anner Alm ORN, MD  balsalazide (COLAZAL ) 750 MG capsule TAKE 2 CAPSULES BY MOUTH TWICE DAILY Patient taking differently: Take 2,250 mg by mouth in the morning and at bedtime. 12/05/19   Avram Lupita BRAVO, MD  balsalazide (COLAZAL ) 750 MG capsule Take 2 capsules (1,500 mg total) by mouth 2 (two) times daily. 09/16/23     Blood Glucose Monitoring Suppl (ONE TOUCH ULTRA 2) w/Device KIT SMARTSIG:Via Meter 01/27/23   [provider]  chlorhexidine  (HIBICLENS ) 4 % external liquid Apply topically daily as needed. 06/16/24   Stuart Vernell Norris, PA-C  Cholecalciferol (VITAMIN D3) 50 MCG (2000 UT) capsule Take 2,000 Units by mouth in the morning and at bedtime.    [provider]  cyanocobalamin  1000 MCG tablet Take 1,000 mcg by mouth in the morning and at bedtime.    [provider]  diltiazem  (CARDIZEM  CD) 120 MG 24 hr capsule Take 1 capsule (120 mg total) by mouth in the morning. 09/16/23     diltiazem  (CARDIZEM  CD) 180 MG 24 hr capsule TAKE 1  CAPSULE BY MOUTH DAILY 06/01/22   Anner Alm ORN, MD  Dulaglutide  (TRULICITY ) 1.5 MG/0.5ML SOAJ Inject 1.5 mg as directed once a week. 04/18/24     Dulaglutide  (TRULICITY ) 3 MG/0.5ML SOAJ Inject 3 mg into the skin once a week. 05/11/23     Dulaglutide  (TRULICITY ) 3 MG/0.5ML SOAJ Inject 3 mg into the skin once a week. 09/16/23     Dulaglutide  (TRULICITY ) 3 MG/0.5ML SOAJ inject 3 mg by subcutaneous route once weekly 10/15/23     empagliflozin  (JARDIANCE ) 25 MG TABS tablet Take 1 tablet (25 mg total) by mouth every morning. 04/18/24     EPINEPHrine  (EPIPEN  2-PAK) 0.3 mg/0.3 mL IJ SOAJ injection Inject 0.3 mg into the muscle as needed for allergic reaction 09/16/23     EPINEPHrine  0.3 mg/0.3 mL IJ SOAJ injection Inject 0.3 mg into the muscle as needed. 09/14/22   [provider]  ezetimibe  (ZETIA ) 10 MG tablet Take 10 mg by mouth daily. 04/13/21   [provider]  ezetimibe  (ZETIA ) 10 MG tablet Take 1 tablet (10 mg total) by mouth daily. 09/16/23     finasteride  (PROSCAR ) 5 MG tablet Take 5 mg by mouth daily. 04/14/17   [provider]  finasteride  (PROSCAR ) 5 MG tablet Take 1 tablet (5 mg total) by mouth daily. 09/16/23     fluticasone -salmeterol (ADVAIR ) 250-50 MCG/ACT AEPB Inhale 1 puff into the lungs 2 (two) times daily. 09/16/23     furosemide  (LASIX ) 20 MG tablet Take 20 mg by mouth daily. 11/07/18   [provider]  insulin  aspart (NOVOLOG ) 100 UNIT/ML injection Inject 6 Units into the skin 3 (three) times daily with meals. 05/05/21   Ghimire, Donalda HERO, MD  insulin  aspart (NOVOLOG ) 100 UNIT/ML injection Inject 0-9 Units into the skin 3 (three) times daily with meals.  0-9 Units, Subcutaneous, 3 times daily with meals,  CBG < 70: Implement Hypoglycemia measuresCBG 70 - 120: 0 units CBG 121 - 150: 1 unit CBG 151 - 200: 2 units CBG 201 - 250: 3 units CBG 251 - 300: 5 units CBG 301 - 350: 7 units CBG 351 - 400: 9 units CBG > 400: call MD 05/05/21   Raenelle Donalda HERO, MD   JANUVIA 100 MG tablet Take 100 mg by mouth daily. 04/13/21   [provider]  Lancets [facility] DELICA PLUS [place]) MISC  09/28/22   [provider]  levothyroxine  (SYNTHROID ) 50 MCG tablet Take 50 mcg by mouth daily. 03/24/21   [provider]  levothyroxine  (SYNTHROID ) 75 MCG tablet Take 1 tablet (75 mcg total) by mouth daily. 09/16/23     mupirocin  ointment (BACTROBAN ) 2 % Apply 1 Application topically 2 (two) times daily. 06/16/24   Stuart Vernell Norris, PA-C  [facility] ULTRA test strip  01/27/23   [provider]  oxybutynin  (DITROPAN ) 5 MG tablet Take 5 mg by mouth daily. 10/02/11   [provider]  oxybutynin  (DITROPAN ) 5 MG tablet Take 1 tablet (5 mg total) by mouth 2 (two) times daily. 09/16/23     pantoprazole  (PROTONIX ) 40 MG tablet Take 1 tablet by mouth 2 (two) times daily.  09/07/14   [provider]  pantoprazole  (PROTONIX ) 40 MG tablet Take 1 tablet (40 mg total) by mouth 2 (two) times daily - 1/2-1 hour before meals 09/16/23     rosuvastatin  (CRESTOR ) 40 MG tablet Take 1 tablet (40 mg total) by mouth every evening after a meal 09/16/23     terbinafine  (LAMISIL ) 250 MG tablet Take 1 tablet (250 mg total) by mouth daily. 05/22/24                                                                                    Vitals:   07/26/24 1421 07/26/24 1424 07/26/24 1823 07/26/24 1956  BP:   (!) 147/92 (!) 132/37  Pulse:   96 (!) 105  Resp:    16  Temp:   98.5 F (36.9 C) 98.4 F (36.9 C)  TempSrc:   Oral Oral  SpO2:  98% 98% 95%  Weight: 74.8 kg     Height: 6' (1.829 m)      Physical Exam Vitals reviewed.  Constitutional:      General: He is not in acute distress.    Appearance: He is not ill-appearing.  HENT:     Head: Normocephalic.  Eyes:     Extraocular Movements: Extraocular movements intact.  Cardiovascular:     Rate and Rhythm: Normal rate and regular rhythm.     Heart sounds: Normal heart sounds.  Pulmonary:      Effort: Pulmonary effort is normal.     Breath sounds: Normal breath sounds.  Abdominal:     General: There is no distension.     Palpations: Abdomen is soft.     Tenderness: There is no abdominal tenderness.  Musculoskeletal:        General: No deformity.     Right lower leg: No edema.     Left lower leg:  No edema.  Skin:    General: Skin is warm.  Neurological:     Mental Status: He is alert and oriented to person, place, and time.     Comments: Can lift both upper extremities and normal strength.     Labs on Admission: I have personally reviewed following labs and imaging studies CBC: Recent Labs  Lab 07/26/24 1512 07/26/24 1520  WBC 8.1  --   HGB 15.1 15.6  HCT 44.9 46.0  MCV 90.5  --   PLT 241  --    Basic Metabolic Panel: Recent Labs  Lab 07/26/24 1512 07/26/24 1520  NA 133* 134*  K 4.2 5.3*  CL 101 103  CO2 20*  --   GLUCOSE 162* 166*  BUN 22 31*  CREATININE 1.40* 1.50*  CALCIUM  9.4  --    GFR: Estimated Creatinine Clearance: 38.1 mL/min (A) (by C-G formula based on SCr of 1.5 mg/dL (H)). Liver Function Tests: Recent Labs  Lab 07/26/24 1512  AST 45*  ALT 23  ALKPHOS 122  BILITOT 0.5  PROT 6.8  ALBUMIN 3.8   No results for input(s): LIPASE, AMYLASE in the last 168 hours. No results for input(s): AMMONIA in the last 168 hours. Recent Labs    12/08/23 0403 07/26/24 1512 07/26/24 1520  BUN 20 22 31*  CREATININE 1.37* 1.40* 1.50*    Cardiac Enzymes: No results for input(s): CKTOTAL, CKMB, CKMBINDEX, TROPONINI in the last 168 hours. BNP (last 3 results) No results for input(s): PROBNP in the last 8760 hours. HbA1C: No results for input(s): HGBA1C in the last 72 hours. CBG: Recent Labs  Lab 07/26/24 2038  GLUCAP 226*   Lipid Profile: No results for input(s): CHOL, HDL, LDLCALC, TRIG, CHOLHDL, LDLDIRECT in the last 72 hours. Thyroid  Function Tests: No results for input(s): TSH, T4TOTAL, FREET4,  T3FREE, THYROIDAB in the last 72 hours. Anemia Panel: No results for input(s): VITAMINB12, FOLATE, FERRITIN, TIBC, IRON, RETICCTPCT in the last 72 hours. Urine analysis:    Component Value Date/Time   COLORURINE YELLOW 12/08/2023 0403   APPEARANCEUR CLEAR 12/08/2023 0403   LABSPEC 1.010 12/08/2023 0403   PHURINE 7.0 12/08/2023 0403   GLUCOSEU 100 (A) 12/08/2023 0403   HGBUR NEGATIVE 12/08/2023 0403   BILIRUBINUR NEGATIVE 12/08/2023 0403   KETONESUR NEGATIVE 12/08/2023 0403   PROTEINUR TRACE (A) 12/08/2023 0403   NITRITE NEGATIVE 12/08/2023 0403   LEUKOCYTESUR NEGATIVE 12/08/2023 0403   Radiological Exams on Admission: CT HEAD WO CONTRAST Result Date: 07/26/2024 CLINICAL DATA:  Trauma fall altered EXAM: CT HEAD WITHOUT CONTRAST CT CERVICAL SPINE WITHOUT CONTRAST TECHNIQUE: Multidetector CT imaging of the head and cervical spine was performed following the standard protocol without intravenous contrast. Multiplanar CT image reconstructions of the cervical spine were also generated. RADIATION DOSE REDUCTION: This exam was performed according to the departmental dose-optimization program which includes automated exposure control, adjustment of the mA and/or kV according to patient size and/or use of iterative reconstruction technique. COMPARISON:  CT brain 12/08/2023 FINDINGS: CT HEAD FINDINGS Brain: No acute territorial infarction, hemorrhage or intracranial mass. Atrophy and chronic small vessel ischemic changes of the white matter. Small left chronic parietooccipital infarct. The ventricles are non enlarged. Vascular: No hyperdense vessels. Vertebral and carotid vascular calcification Skull: None no fracture Sinuses/Orbits: No acute finding. Other: None CT CERVICAL SPINE FINDINGS Alignment: Trace anterolisthesis C4 on C5, C5 on C6 and C7 on T1. Facet alignment is normal Skull base and vertebrae: No acute fracture. No primary bone lesion or focal  pathologic process. Soft tissues and  spinal canal: No prevertebral fluid or swelling. No visible canal hematoma. Disc levels: Multilevel degenerative changes. Advanced disc space narrowing C5-C6 and C6-C7 with moderate disc space narrowing at C7-T1. Multilevel hypertrophic facet degenerative changes with foraminal narrowing. Upper chest: Apical emphysema Other: None IMPRESSION: 1. No CT evidence for acute intracranial abnormality. Atrophy and chronic small vessel ischemic changes of the white matter. 2. Multilevel degenerative changes of the cervical spine. No acute osseous abnormality. Electronically Signed   By: Luke Bun M.D.   On: 07/26/2024 16:34   CT CERVICAL SPINE WO CONTRAST Result Date: 07/26/2024 CLINICAL DATA:  Trauma fall altered EXAM: CT HEAD WITHOUT CONTRAST CT CERVICAL SPINE WITHOUT CONTRAST TECHNIQUE: Multidetector CT imaging of the head and cervical spine was performed following the standard protocol without intravenous contrast. Multiplanar CT image reconstructions of the cervical spine were also generated. RADIATION DOSE REDUCTION: This exam was performed according to the departmental dose-optimization program which includes automated exposure control, adjustment of the mA and/or kV according to patient size and/or use of iterative reconstruction technique. COMPARISON:  CT brain 12/08/2023 FINDINGS: CT HEAD FINDINGS Brain: No acute territorial infarction, hemorrhage or intracranial mass. Atrophy and chronic small vessel ischemic changes of the white matter. Small left chronic parietooccipital infarct. The ventricles are non enlarged. Vascular: No hyperdense vessels. Vertebral and carotid vascular calcification Skull: None no fracture Sinuses/Orbits: No acute finding. Other: None CT CERVICAL SPINE FINDINGS Alignment: Trace anterolisthesis C4 on C5, C5 on C6 and C7 on T1. Facet alignment is normal Skull base and vertebrae: No acute fracture. No primary bone lesion or focal pathologic process. Soft tissues and spinal canal: No  prevertebral fluid or swelling. No visible canal hematoma. Disc levels: Multilevel degenerative changes. Advanced disc space narrowing C5-C6 and C6-C7 with moderate disc space narrowing at C7-T1. Multilevel hypertrophic facet degenerative changes with foraminal narrowing. Upper chest: Apical emphysema Other: None IMPRESSION: 1. No CT evidence for acute intracranial abnormality. Atrophy and chronic small vessel ischemic changes of the white matter. 2. Multilevel degenerative changes of the cervical spine. No acute osseous abnormality. Electronically Signed   By: Luke Bun M.D.   On: 07/26/2024 16:34   CT ABDOMEN PELVIS W CONTRAST Result Date: 07/26/2024 EXAM: CT ABDOMEN AND PELVIS WITH CONTRAST 07/26/2024 04:11:00 PM TECHNIQUE: CT of the abdomen and pelvis was performed with the administration of 75 mL of iohexol  (OMNIPAQUE ) 350 MG/ML injection. Multiplanar reformatted images are provided for review. Automated exposure control, iterative reconstruction, and/or weight-based adjustment of the mA/kV was utilized to reduce the radiation dose to as low as reasonably achievable. COMPARISON: CT abdomen and pelvis 01/32/2018. CLINICAL HISTORY: Polytrauma, blunt. FINDINGS: LOWER CHEST: No acute abnormality. LIVER: The liver is unremarkable. GALLBLADDER AND BILE DUCTS: Gallbladder surgically absent. No biliary ductal dilatation. SPLEEN: No acute abnormality. PANCREAS: No acute abnormality. ADRENAL GLANDS: No acute abnormality. KIDNEYS, URETERS AND BLADDER: Moderate bilateral hydroureteronephrosis to the level of the bladder without obstructing calculus. The bladder is markedly distended. There are cysts of the right kidney measuring up to 2.2 cm. Per consensus, no follow-up is needed for simple Bosniak type 1 and 2 renal cysts, unless the patient has a malignancy history or risk factors. No perinephric or periureteral stranding. GI AND BOWEL: Stomach demonstrates no acute abnormality. There is a large amount of stool  throughout the colon. The appendix is not visualized. There is no bowel obstruction. PERITONEUM AND RETROPERITONEUM: No ascites. No free air. VASCULATURE: Aorta is normal in caliber. There are atherosclerotic  calcifications of the aorta and iliac arteries. LYMPH NODES: No lymphadenopathy. REPRODUCTIVE ORGANS: No acute abnormality. BONES AND SOFT TISSUES: The bones are diffusely osteopenic. There is a comminuted right femoral intertrochanteric fracture and proximal diaphyseal fracture which appears nondisplaced. No focal soft tissue abnormality. IMPRESSION: 1. Comminuted nondisplaced right femoral intertrochanteric fracture with associated proximal diaphyseal fracture. 2. Markedly distended bladder with moderate bilateral hydroureteronephrosis, likely related to urinary retention. 3. Large colonic stool burden. 4. Diffuse osteopenia. Electronically signed by: Greig Pique MD MD 07/26/2024 04:31 PM EST RP Workstation: [id number]   DG Chest Portable 1 View Result Date: 07/26/2024 CLINICAL DATA:  Trauma EXAM: PORTABLE CHEST 1 VIEW COMPARISON:  12/08/2023 FINDINGS: Post sternotomy changes. Emphysema and bronchitic changes. No acute airspace disease, pleural effusion or pneumothorax. Stable cardiomediastinal silhouette with aortic atherosclerosis. IMPRESSION: No active disease. Emphysema and bronchitic changes. Electronically Signed   By: Luke Bun M.D.   On: 07/26/2024 15:49   DG Forearm Left Result Date: 07/26/2024 CLINICAL DATA:  Mechanical fall EXAM: LEFT FOREARM - 2 VIEW COMPARISON:  None Available. FINDINGS: No fracture or malalignment.  No significant elbow effusion IMPRESSION: No acute osseous abnormality. Electronically Signed   By: Luke Bun M.D.   On: 07/26/2024 15:49   DG Pelvis Portable Result Date: 07/26/2024 CLINICAL DATA:  Hip pain post fall EXAM: PORTABLE PELVIS 1-2 VIEWS COMPARISON:  08/24/2021 FINDINGS: SI joints are non widened. Pubic symphysis and rami appear intact. Femoral heads  project in joint. Mild bilateral hip degenerative changes. Acute minimally displaced right intertrochanteric proximal femur fracture. IMPRESSION: Acute minimally displaced right intertrochanteric proximal femur fracture. Electronically Signed   By: Luke Bun M.D.   On: 07/26/2024 15:48   Data Reviewed: Relevant notes from primary care and specialist visits, past discharge summaries as available in EHR, including Care Everywhere . Prior diagnostic testing as pertinent to current admission diagnoses, Updated medications and problem lists for reconciliation .ED course, including vitals, labs, imaging, treatment and response to treatment,Triage notes, nursing and pharmacy notes and ED provider's notes.Notable results as noted in HPI.Discussed case with EDMD/ ED APP/ or Specialty MD on call and as needed.  Assessment & Plan  >>Right intertrochanteric femoral fracture: Comminuted nondisplaced right intertrochanteric fracture with associated proximal diaphyseal fracture requiring surgical fixation. Per orthopedic recommendation cephalomedullary nail fixation (long nail given proximal diaphyseal extension) as the standard treatment for unstable intertrochanteric fractures. Surgery within 24 hours  Recommend fascia iliaca compartment block or femoral nerve block for preoperative and postoperative analgesia. opioids as needed. Perioperative antibiotics: Cefazolin 2 g IV within 1 hour before incision, continue for 24 hours postoperatively to prevent surgical site infection. Early mobilization within 24 hours with physical therapy. Transfusion threshold hemoglobin <8 g/dL for asymptomatic patients (patient currently 7.9 g/dL - may need transfusion if symptomatic).  >> Diabetes mellitus type 2: Patient on home regimen of dulaglutide  3 mg weekly, empagliflozin  25 mg daily, sitagliptin 100 mg daily, and insulin  aspart sliding scale.  Hold empagliflozin  , Hold metformin on day of surgery .  Implement  basal-bolus insulin  regimen rather than sliding scale alone for better glycemic control and reduced surgical complications. Monitor blood glucose every 2-4 hours while NPO and perioperatively. Endocrinology or diabetes specialty consultation may be beneficial for complex perioperative management.   >> CKD stage IIIa (eGFR 38-49 mL/min): Lab Results  Component Value Date   CREATININE 1.50 (H) 07/26/2024   CREATININE 1.40 (H) 07/26/2024   CREATININE 1.37 (H) 12/08/2023  Baseline creatinine 1.37-1.40, now 1.50 with eGFR 38 mL/min by  Cockcroft-Gault.  Likely prerenal azotemia in setting of NPO status and distended bladder.  Monitor renal function closely perioperatively. Ensure adequate hydration with IV fluids.  Adjust medication dosing for renal function. Continue monitoring electrolytes given CKD and diuretic use.    >>Chronic atrial fibrillation: Patient has chronic atrial fibrillation on apixaban  with CHA2DS2-VASc score of 4. Currently hemodynamically stable with heart rate 93. Continue diltiazem  120 mg daily for rate control. Hold apixaban  2 days preoperatively as outlined above. Monitor for postoperative atrial fibrillation with rapid ventricular response and treat underlying triggers (pain, anemia, volume shifts). Resume apixaban  postoperatively per orthopedic surgery recommendations, typically 2 days after high bleeding risk procedures once hemostasis confirmed.    >> Hyperkalemia:    Latest Ref Rng & Units 07/26/2024    3:20 PM 07/26/2024    3:12 PM 12/08/2023    4:03 AM  BMP  Glucose 70 - 99 mg/dL 833  837  855   BUN 8 - 23 mg/dL 31  22  20    Creatinine 0.61 - 1.24 mg/dL 8.49  8.59  8.62   Sodium 135 - 145 mmol/L 134  133  136   Potassium 3.5 - 5.1 mmol/L 5.3  4.2  4.8   Chloride 98 - 111 mmol/L 103  101  99   CO2 22 - 32 mmol/L  20  25   Calcium  8.9 - 10.3 mg/dL  9.4  89.9   Mild hyperkalemia likely multifactorial from CKD, urinary retention, and medications. Initial  management includes treating correctable factors: place Foley catheter to relieve urinary retention, ensure adequate hydration, and review medications. Cont IVF with ns at 50 ccx 1 day. Stat BMP recheck.  Avoid NSAIDs and potassium-containing salt substitutes. Recheck basic metabolic panel in AM. If hyperkalemia persists despite these measures, consider potassium binders and insulin  dextrose. Avoid stopping apixaban  or diltiazem  if possible given cardiovascular benefits.    >>Distended bladder with bilateral hydroureteronephrosis: Place indwelling Foley catheter immediately for bladder decompression and relief of urinary retention.Moderate bilateral hydroureteronephrosis on CT likely secondary to urinary retention from multiple factors including dementia, medications (oxybutynin ), and immobility. Monitor urine output closely. Recheck renal function after decompression. May need urology consultation if retention persists or if underlying obstructive pathology identified. Will hold oxybutinin for now.   >>GERD/Barrett's esophagus: Continue pantoprazole  40 mg twice daily. NPO for surgery. Resume when taking PO postoperatively  >>COPD/Asthma/Pulmonary fibrosis: Continue home regimen of fluticasone -salmeterol (Symbicort ) 160-4.5 mcg 2 puffs twice daily and albuterol  as needed. Incentive spirometry ordered. Monitor oxygen  saturation. Currently on 2L nasal cannula with SpO2 96-98%. Pulmonary toilet and early mobilization postoperatively to prevent complications.  >>Hypothyroidism: Continue levothyroxine  75 mcg daily (note discrepancy between 50 mcg and 75 mcg in medication list - clarify correct dose).  >>Ulcerative colitis: Continue balsalazide 1500 mg twice daily.  >>Benign prostatic hyperplasia: Continue finasteride  5 mg daily. hold oxybutynin  given urinary retention   >>Dementia: Patient at baseline per wife. Continue home Seroquel  if on medication list .   DVT prophylaxis:  SCD's    Consults:  Orthopedic.   Advance Care Planning:    Code Status: Full Code   Family Communication:  Wife caroline.  Disposition Plan:  TBD. Severity of Illness: The appropriate patient status for this patient is INPATIENT. Inpatient status is judged to be reasonable and necessary in order to provide the required intensity of service to ensure the patient's safety. The patient's presenting symptoms, physical exam findings, and initial radiographic and laboratory data in the context of their chronic comorbidities is  felt to place them at high risk for further clinical deterioration. Furthermore, it is not anticipated that the patient will be medically stable for discharge from the hospital within 2 midnights of admission.   * I certify that at the point of admission it is my clinical judgment that the patient will require inpatient hospital care spanning beyond 2 midnights from the point of admission due to high intensity of service, high risk for further deterioration and high frequency of surveillance required.*  Unresulted Labs (From admission, onward)     Start     Ordered   07/27/24 0500  Basic metabolic panel  Tomorrow morning,   R        07/26/24 1727   07/27/24 0500  CBC  Tomorrow morning,   R        07/26/24 1727   07/26/24 2025  Basic metabolic panel  ONCE - STAT,   STAT        07/26/24 2025            Meds ordered this encounter  Medications   fentaNYL  (SUBLIMAZE ) injection 50 mcg   iohexol  (OMNIPAQUE ) 350 MG/ML injection 75 mL   HYDROcodone -acetaminophen  (NORCO/VICODIN) 5-325 MG per tablet 1-2 tablet    Refill:  0   morphine  (PF) 2 MG/ML injection 2 mg   OR Linked Order Group    methocarbamol  (ROBAXIN ) tablet 500 mg    methocarbamol  (ROBAXIN ) injection 500 mg   polyethylene glycol (MIRALAX  / GLYCOLAX ) packet 17 g   bisacodyl  (DULCOLAX) EC tablet 5 mg   0.9 %  sodium chloride  infusion   DISCONTD: budesonide -formoterol  (SYMBICORT ) 160-4.5 MCG/ACT inhaler 2 puff    albuterol  (PROVENTIL ) (2.5 MG/3ML) 0.083% nebulizer solution 2.5 mg   balsalazide (COLAZAL ) capsule 1,500 mg   levothyroxine  (SYNTHROID ) tablet 50 mcg   DISCONTD: pantoprazole  (PROTONIX ) EC tablet 40 mg   fluticasone  furoate-vilanterol (BREO ELLIPTA ) 200-25 MCG/ACT 1 puff   Chlorhexidine  Gluconate Cloth 2 % PADS 6 each   diltiazem  (CARDIZEM  CD) 24 hr capsule 120 mg   finasteride  (PROSCAR ) tablet 5 mg   pantoprazole  (PROTONIX ) injection 40 mg     Orders Placed This Encounter  Procedures   FAST US    CT HEAD WO CONTRAST   CT CERVICAL SPINE WO CONTRAST   CT ABDOMEN PELVIS W CONTRAST   DG Forearm Left   DG Chest Portable 1 View   DG Pelvis Portable   Comprehensive metabolic panel   CBC   Ethanol   Protime-INR   Basic metabolic panel   CBC   Basic metabolic panel   Glucose, capillary   Diet NPO time specified   ED Cardiac monitoring   Measure blood pressure   Initiate Carrier Fluid Protocol   SCDs   Vital signs every hour x 4, then every 4 hours   Neurovascular checks every hour x 4, then every 4 hours   Bed with an overhead patient helper or overhead frame with a trapeze bar   Apply ice to affected area   Elevate heels off of bed   Turn cough deep breathe   Incentive spirometry   Bed rest with HOB to 30 degrees   Intake and output   If diabetic or glucose greater than 140mg /dl, notify physician to place Gylcemic Control (SSI) Order Set   Initiate Oral Care Protocol   Initiate Carrier Fluid Protocol   Document Pasero Opioid-Induced Sedation Scale (POSS) per protocol (see sidebar report)   Cardiac Monitoring - Continuous Indefinite   Insert urethral  catheter If Coude Catheter is chosen, qualified resources by campus can be found in the clinical skills nursing procedure for Coude Catheter, And on Amion under Urology.  If catheter irrigation is needed consider large-bore cathete...   Refer to Sidebar Report Urinary (Foley) Catheter Indications   Refer to Sidebar Report Post  Indwelling Urinary Catheter Removal and Intervention Guidelines   Full code   Consult to orthopedic surgery   Consult to hospitalist   Consult to Transition of Care Team   Consult to Registered Dietitian   Consult to anesthesiology for nerve block Laterality: Right; Nerve block will be performed in PACU.   PT eval and treat   ED Pulse oximetry, continuous   Oxygen  therapy Mode or (Route): Nasal cannula; Liters Per Minute: 2   Pulse oximetry, continuous   Pulse oximetry, every 4 hours   I-Stat Chem 8, ED   I-Stat Lactic Acid, ED   EKG 12-Lead   Sample to Blood Bank   Type and screen [facility]   ABO/Rh   Admit to Inpatient (patient's expected length of stay will be greater than 2 midnights or inpatient only procedure)    Author: Mario LULLA Blanch, MD 12 pm- 8 pm. [facility]. 07/26/2024 9:00 PM Please note for any communication after hours contact [facility] Assigned provider on call on Amion.     [1]  Allergies Allergen Reactions   Atorvastatin Other (See Comments)    Arthralgia, myalgia   Fluvastatin Other (See Comments)    Arthralgia, myalgias   Hymenoptera Venom Preparations    Lisinopril Cough   Metformin And Related Other (See Comments)    Anorexia, diarrhea, nausea, weight loss   Pravastatin Other (See Comments)    Arthralgia, myalgias   Simvastatin Other (See Comments)    myalgias   Spiriva Handihaler [Tiotropium Bromide] Nausea Only   "

## 2024-07-26 NOTE — ED Triage Notes (Signed)
 Patient BIB [facility] from home for a mechanical fall while walking with his walker. Family denies head injury and LOC, patient is A&Ox2 at baseline and remains same. R hip pain to palpation. VSS BP 134/68 HR 98 98% RA CBG 202 R 18

## 2024-07-26 NOTE — Progress Notes (Signed)
 Orthopedic Tech Progress Note Patient Details:  Timothy Mclean 1941/05/02 [id number]  Level 2 trauma   Patient ID: Timothy Mclean [name], male   DOB: 01-Oct-1940, 84 y.o.   MRN: [id number]  Timothy Mclean [name] 07/26/2024, 2:16 PM

## 2024-07-26 NOTE — ED Provider Notes (Addendum)
 Care was taken over from Dr. Pamella.  Patient had a mechanical fall.  He is on Eliquis .  X-rays show a right intertrochanteric fracture.  Otherwise no traumatic injuries were identified.  Discussed with Dr. Beverley with orthopedics.  He will plan on possibly taking him to the OR tomorrow.  Will admit to hospitalist  Pt has urinary distension on CT. Advised RN and tech to check bladder scan.   Lenor Hollering, MD 07/26/24 8182    Lenor Hollering, MD 07/26/24 2333

## 2024-07-26 NOTE — ED Provider Notes (Signed)
 " [place] EMERGENCY DEPARTMENT AT [facility] Provider Note   CSN: [id number] Arrival date & time: 07/26/24  1414     Patient presents with: Timothy Mclean is a 84 y.o. male.  With a history of dementia, status post CABG A-fib on Eliquis  who presents to the ED after fall.  Patient suffered a mechanical fall at home while walking with his walker.  Reported right hip pain after the fall but unable to recall details secondary to dementia.  At neurologic baseline now.  Still reporting some right hip pain.  Denies headaches neck pain chest pain shortness of breath abdominal pain pain in other extremities    Fall       Prior to Admission medications  Medication Sig Start Date End Date Taking? Authorizing Provider  ADVAIR  DISKUS 250-50 MCG/DOSE AEPB Inhale 1 puff into the lungs 2 (two) times daily.  08/16/16   [provider]  albuterol  (PROVENTIL  HFA;VENTOLIN  HFA) 108 (90 Base) MCG/ACT inhaler Inhale into the lungs every 6 (six) hours as needed for wheezing or shortness of breath.    [provider]  albuterol  (VENTOLIN  HFA) 108 (90 Base) MCG/ACT inhaler Inhale 2 puffs into the lungs every 4 (four) hours as needed for chest congestion, cough, shortness of breath, or wheezing 09/16/23     apixaban  (ELIQUIS ) 5 MG TABS tablet Take 1 tablet (5 mg total) by mouth 2 (two) times daily. 08/10/23   Anner Alm ORN, MD  apixaban  (ELIQUIS ) 5 MG TABS tablet Take 1 tablet (5 mg total) by mouth 2 (two) times daily. 05/24/24   Anner Alm ORN, MD  balsalazide (COLAZAL ) 750 MG capsule TAKE 2 CAPSULES BY MOUTH TWICE DAILY Patient taking differently: Take 2,250 mg by mouth in the morning and at bedtime. 12/05/19   Avram Lupita FORBES, MD  balsalazide (COLAZAL ) 750 MG capsule Take 2 capsules (1,500 mg total) by mouth 2 (two) times daily. 09/16/23     Blood Glucose Monitoring Suppl (ONE TOUCH ULTRA 2) w/Device KIT SMARTSIG:Via Meter 01/27/23   [provider]  chlorhexidine   (HIBICLENS ) 4 % external liquid Apply topically daily as needed. 06/16/24   Stuart Vernell Norris, PA-C  Cholecalciferol (VITAMIN D3) 50 MCG (2000 UT) capsule Take 2,000 Units by mouth in the morning and at bedtime.    [provider]  cyanocobalamin  1000 MCG tablet Take 1,000 mcg by mouth in the morning and at bedtime.    [provider]  diltiazem  (CARDIZEM  CD) 120 MG 24 hr capsule Take 1 capsule (120 mg total) by mouth in the morning. 09/16/23     diltiazem  (CARDIZEM  CD) 180 MG 24 hr capsule TAKE 1 CAPSULE BY MOUTH DAILY 06/01/22   Anner Alm ORN, MD  Dulaglutide  (TRULICITY ) 1.5 MG/0.5ML SOAJ Inject 1.5 mg as directed once a week. 04/18/24     Dulaglutide  (TRULICITY ) 3 MG/0.5ML SOAJ Inject 3 mg into the skin once a week. 05/11/23     Dulaglutide  (TRULICITY ) 3 MG/0.5ML SOAJ Inject 3 mg into the skin once a week. 09/16/23     Dulaglutide  (TRULICITY ) 3 MG/0.5ML SOAJ inject 3 mg by subcutaneous route once weekly 10/15/23     empagliflozin  (JARDIANCE ) 25 MG TABS tablet Take 1 tablet (25 mg total) by mouth every morning. 04/18/24     EPINEPHrine  (EPIPEN  2-PAK) 0.3 mg/0.3 mL IJ SOAJ injection Inject 0.3 mg into the muscle as needed for allergic reaction 09/16/23     EPINEPHrine  0.3 mg/0.3 mL IJ SOAJ injection Inject 0.3  mg into the muscle as needed. 09/14/22   [provider]  ezetimibe  (ZETIA ) 10 MG tablet Take 10 mg by mouth daily. 04/13/21   [provider]  ezetimibe  (ZETIA ) 10 MG tablet Take 1 tablet (10 mg total) by mouth daily. 09/16/23     finasteride  (PROSCAR ) 5 MG tablet Take 5 mg by mouth daily. 04/14/17   [provider]  finasteride  (PROSCAR ) 5 MG tablet Take 1 tablet (5 mg total) by mouth daily. 09/16/23     fluticasone -salmeterol (ADVAIR ) 250-50 MCG/ACT AEPB Inhale 1 puff into the lungs 2 (two) times daily. 09/16/23     furosemide  (LASIX ) 20 MG tablet Take 20 mg by mouth daily. 11/07/18   [provider]  insulin  aspart (NOVOLOG ) 100 UNIT/ML  injection Inject 6 Units into the skin 3 (three) times daily with meals. 05/05/21   Ghimire, Donalda HERO, MD  insulin  aspart (NOVOLOG ) 100 UNIT/ML injection Inject 0-9 Units into the skin 3 (three) times daily with meals. 0-9 Units, Subcutaneous, 3 times daily with meals,  CBG < 70: Implement Hypoglycemia measuresCBG 70 - 120: 0 units CBG 121 - 150: 1 unit CBG 151 - 200: 2 units CBG 201 - 250: 3 units CBG 251 - 300: 5 units CBG 301 - 350: 7 units CBG 351 - 400: 9 units CBG > 400: call MD 05/05/21   Raenelle Donalda HERO, MD  JANUVIA 100 MG tablet Take 100 mg by mouth daily. 04/13/21   [provider]  Lancets [facility] DELICA PLUS [place]) MISC  09/28/22   [provider]  levothyroxine  (SYNTHROID ) 50 MCG tablet Take 50 mcg by mouth daily. 03/24/21   [provider]  levothyroxine  (SYNTHROID ) 75 MCG tablet Take 1 tablet (75 mcg total) by mouth daily. 09/16/23     mupirocin  ointment (BACTROBAN ) 2 % Apply 1 Application topically 2 (two) times daily. 06/16/24   Stuart Vernell Norris, PA-C  [facility] ULTRA test strip  01/27/23   [provider]  oxybutynin  (DITROPAN ) 5 MG tablet Take 5 mg by mouth daily. 10/02/11   [provider]  oxybutynin  (DITROPAN ) 5 MG tablet Take 1 tablet (5 mg total) by mouth 2 (two) times daily. 09/16/23     pantoprazole  (PROTONIX ) 40 MG tablet Take 1 tablet by mouth 2 (two) times daily.  09/07/14   [provider]  pantoprazole  (PROTONIX ) 40 MG tablet Take 1 tablet (40 mg total) by mouth 2 (two) times daily - 1/2-1 hour before meals 09/16/23     rosuvastatin  (CRESTOR ) 40 MG tablet Take 1 tablet (40 mg total) by mouth every evening after a meal 09/16/23     terbinafine  (LAMISIL ) 250 MG tablet Take 1 tablet (250 mg total) by mouth daily. 05/22/24       Allergies: Atorvastatin, Fluvastatin, Hymenoptera venom preparations, Lisinopril, Metformin and related, Pravastatin, Simvastatin, and Spiriva handihaler [tiotropium bromide]    Review of  Systems  Updated Vital Signs BP 136/72   Pulse 93   Temp 98.7 F (37.1 C) (Oral)   Resp 16   Ht 6' (1.829 m)   Wt 74.8 kg   SpO2 98%   BMI 22.38 kg/m   Physical Exam Vitals and nursing note reviewed.  HENT:     Head: Normocephalic and atraumatic.  Eyes:     Pupils: Pupils are equal, round, and reactive to light.  Cardiovascular:     Rate and Rhythm: Normal rate and regular rhythm.  Pulmonary:     Effort: Pulmonary effort is normal.  Breath sounds: Normal breath sounds.  Abdominal:     Palpations: Abdomen is soft.     Tenderness: There is no abdominal tenderness.  Musculoskeletal:     Cervical back: Neck supple. No tenderness.     Comments: Skin avulsions over both forearms Bony tenderness to left forearm No deformity tenderness over shoulders elbows wrists or hands No midline tenderness step-off deformity back Tenderness to deep palpation of right anterior hip holding in flexion unable to test range of motion secondary to pain 2+ DP pulses bilaterally with sensation and motion intact to both feet No tenderness deformity of left hip  Skin:    General: Skin is warm and dry.  Neurological:     Mental Status: He is alert.  Psychiatric:        Mood and Affect: Mood normal.     (all labs ordered are listed, but only abnormal results are displayed) Labs Reviewed  COMPREHENSIVE METABOLIC PANEL WITH GFR  CBC  ETHANOL  PROTIME-INR  I-STAT CHEM 8, ED  I-STAT CG4 LACTIC ACID, ED  SAMPLE TO BLOOD BANK    EKG: None  Radiology: No results found.   Ultrasound ED FAST  Date/Time: 07/26/2024 2:55 PM  Performed by: Pamella Ozell LABOR, DO Authorized by: Pamella Ozell LABOR, DO    Comments:     EMERGENCY DEPARTMENT US  FAST EXAM Limited Ultrasound of the Abdomen and Pericardium (eFAST Exam).   INDICATIONS :Blunt injury of abdomen and/or chest Multiple views of the abdomen and pericardium are obtained with a multi-frequency probe.  PERFORMED BY: Myself IMAGES  ARCHIVED?: Yes LIMITATIONS: None  Findings: A complete extended Focused Assessment with Sonography for Trauma (eFAST) was performed, including the following views: Right upper-quadrant abdominal Left upper-quadrant abdominal Suprapubic (transverse and sagittal) Subxiphoid cardiac Bilateral anterior thoracic (lung/pneumothorax assessment)  All views were adequately visualized. No evidence of intraperitoneal, pericardial, or pleural free fluid was identified. No sonographic evidence of pneumothorax was present. No abnormal cardiac findings were noted.  Impression: Negative eFAST examination: No sonographic evidence of intra-abdominal, intrathoracic, or pericardial free fluid, and no pneumothorax detected.      Medications Ordered in the ED - No data to display  Clinical Course as of 07/26/24 1514  Wed Jul 26, 2024  1514 I, Ozell Pamella DO, am transitioning care of this patient to the oncoming provider pending results of imaging reevaluation and disposition [MP]    Clinical Course User Index [MP] Pamella Ozell LABOR, DO                                 Medical Decision Making 84 year old male with history as above presenting as a level 2 trauma via EMS for fall on thinners.  Mechanical fall at home and walking with walker.  Now reporting right hip pain.  Is holding right hip in flexion with some tenderness.  Pulses sensation and motor intact.  E-FAST negative hemodynamically stable. Skin tear with some bony tenderness over left forearm no other obvious signs of trauma given fall on thinners and limited history due to dementia will obtain CT head C-spine chest abdomen pelvis along with x-ray of left forearm chest x-ray and x-ray of pelvis  Amount and/or Complexity of Data Reviewed Labs: ordered. Radiology: ordered.        Final diagnoses:  Fall, initial encounter  Right hip pain    ED Discharge Orders     None  Pamella Ozell LABOR, DO 07/26/24 1514  "

## 2024-07-26 NOTE — Progress Notes (Signed)
 Orthopedic Tech Progress Note Patient Details:  Timothy Mclean [birth year] [id number]  Patient ID: Timothy Mclean Timothy Mclean, male   DOB: October 23, 1940, 84 y.o.   MRN: [id number] No OHF. Age restricted. Timothy Mclean 07/26/2024, 5:40 PM

## 2024-07-26 NOTE — H&P (Signed)
 I just discussed Case with EDP and reviewed imaging. Plan is admission to hospital service I am nailed for right hip with Dr. Celena tomorrow parentheses Thursday parentheses morning formal council to follow.

## 2024-07-26 NOTE — TOC CM/SW Note (Signed)
 TOC consult received for d/c planning needs. Possible need for SNF placement. Therapy recs pending. Follow-up to be completed with patient as appropriate.   Merilee Batty, MSN, RN Case Management [phone]

## 2024-07-26 NOTE — Anesthesia Preprocedure Evaluation (Addendum)
 "                                  Anesthesia Evaluation  Patient identified by MRN, date of birth, ID band Patient awake    Reviewed: Allergy & Precautions, NPO status , Patient's Chart, lab work & pertinent test results, reviewed documented beta blocker date and time   Airway Mallampati: II  TM Distance: >3 FB     Dental  (+) Edentulous Lower, Edentulous Upper   Pulmonary asthma , pneumonia, resolved, COPD,  COPD inhaler, former smoker Hx/o pulmonary fibrosis   breath sounds clear to auscultation + decreased breath sounds      Cardiovascular hypertension, Pt. on medications + CAD, + Past MI, + Cardiac Stents and + CABG  Normal cardiovascular exam+ dysrhythmias Atrial Fibrillation (-) pacemaker+ Valvular Problems/Murmurs  Rhythm:Regular Rate:Normal   MI x 2 1987, 1997 1987 angioplasty and BMS- Cx; b. 11/1995 CABG x 4;  c. MYOVIEW  1/'05: ? Ischemia vs. artifact --> sent for cath - patent grafts. S/P CABG x 4 1997 a) LIMA-LAD, SVG to OM, SVG-dRCA-RPL; b) FALSE + MYOVIEW  --> CATH 1/'05: 100% LAD after widelly patent D1, all Cx OM branches 100%, pRCA 100%, SVG-PDA patent w/ retrograde filling of RPL, SVG-OM widely patent ~ normal OM, LIMA-LAD patent.  Hx/o Permanent Atrial Fibrillation  EKG 12/10/23 Atrial fibrillation 94/min, poor R wave progression, prolonged QT  Echo 05/01/21 1. Left ventricular ejection fraction, by estimation, is 60 to 65%. The  left ventricle has normal function. The left ventricle has no regional  wall motion abnormalities. Left ventricular diastolic function could not  be evaluated.   2. Right ventricular systolic function is normal. The right ventricular  size is normal.   3. Left atrial size was moderately dilated.   4. Right atrial size was mildly dilated.   5. The mitral valve is normal in structure. No evidence of mitral valve  regurgitation. No evidence of mitral stenosis.   6. The aortic valve has an indeterminant number of cusps.  Aortic valve  regurgitation is not visualized. No aortic stenosis is present.   7. Aortic dilatation noted. There is borderline dilatation of the aortic  root, measuring 38 mm.   8. The inferior vena cava is normal in size with greater than 50%  respiratory variability, suggesting right atrial pressure of 3 mmHg.     Neuro/Psych   Anxiety    Dementia Bilateral lumbar radiculopathy  Neuromuscular disease    GI/Hepatic PUD,GERD  ,,Lab Results      Component                Value               Date                      ALT                      23                  07/26/2024                AST                      45 (H)              07/26/2024  ALKPHOS                  122                 07/26/2024                BILITOT                  0.5                 07/26/2024            Hx/o ulcerative colitis Hx/o Barrett's esophagus   Endo/Other  diabetes, Well Controlled, Type 2, Oral Hypoglycemic Agents, Insulin  DependentHypothyroidism  GLP-1 RA therapy last dose 1/7 HLD Gout  Renal/GU Renal InsufficiencyRenal diseaseHx/o nephrolithiasis Lab Results      Component                Value               Date                      NA                       134 (L)             07/26/2024                CL                       103                 07/26/2024                K                        5.3 (H)             07/26/2024                CO2                      20 (L)              07/26/2024                BUN                      31 (H)              07/26/2024                CREATININE               1.50 (H)            07/26/2024                GFRNONAA                 49 (L)              07/26/2024                CALCIUM                   9.4                 07/26/2024  ALBUMIN                   3.8                 07/26/2024                GLUCOSE                  166 (H)             07/26/2024              Hx/o ulcerative prostatitis     Musculoskeletal Intertrochanteric right hip Fx Lumbar spinal stenosis   Abdominal   Peds  Hematology  (+) Blood dyscrasia, anemia Eliquis  therapy- last dose 1/7   Anesthesia Other Findings   Reproductive/Obstetrics ED                              Anesthesia Physical Anesthesia Plan  ASA: 3  Anesthesia Plan: General   Post-op Pain Management: Dilaudid  IV   Induction: Intravenous  PONV Risk Score and Plan: 3 and Treatment may vary due to age or medical condition, Ondansetron , Dexamethasone, Propofol  infusion and TIVA  Airway Management Planned: Oral ETT  Additional Equipment: None  Intra-op Plan:   Post-operative Plan: Extubation in OR  Informed Consent: I have reviewed the patients History and Physical, chart, labs and discussed the procedure including the risks, benefits and alternatives for the proposed anesthesia with the patient or authorized representative who has indicated his/her understanding and acceptance.     Dental advisory given  Plan Discussed with: CRNA and Anesthesiologist  Anesthesia Plan Comments:          Anesthesia Quick Evaluation  "

## 2024-07-27 ENCOUNTER — Other Ambulatory Visit: Payer: Self-pay

## 2024-07-27 ENCOUNTER — Encounter (HOSPITAL_COMMUNITY): Payer: Self-pay | Admitting: Internal Medicine

## 2024-07-27 ENCOUNTER — Inpatient Hospital Stay (HOSPITAL_COMMUNITY): Payer: Self-pay | Admitting: Anesthesiology

## 2024-07-27 ENCOUNTER — Inpatient Hospital Stay (HOSPITAL_COMMUNITY)

## 2024-07-27 ENCOUNTER — Encounter (HOSPITAL_COMMUNITY)
Admission: EM | Disposition: A | Discharge status: Skilled Nursing Facility | Payer: Self-pay | Source: Home / Self Care | Attending: Internal Medicine

## 2024-07-27 DIAGNOSIS — M25551 Pain in right hip: Secondary | ICD-10-CM | POA: Diagnosis not present

## 2024-07-27 DIAGNOSIS — I2510 Atherosclerotic heart disease of native coronary artery without angina pectoris: Secondary | ICD-10-CM

## 2024-07-27 DIAGNOSIS — Z87891 Personal history of nicotine dependence: Secondary | ICD-10-CM | POA: Diagnosis not present

## 2024-07-27 DIAGNOSIS — N179 Acute kidney failure, unspecified: Secondary | ICD-10-CM | POA: Diagnosis not present

## 2024-07-27 DIAGNOSIS — I10 Essential (primary) hypertension: Secondary | ICD-10-CM

## 2024-07-27 DIAGNOSIS — S72001A Fracture of unspecified part of neck of right femur, initial encounter for closed fracture: Secondary | ICD-10-CM | POA: Diagnosis not present

## 2024-07-27 DIAGNOSIS — I959 Hypotension, unspecified: Secondary | ICD-10-CM | POA: Diagnosis not present

## 2024-07-27 DIAGNOSIS — S72001D Fracture of unspecified part of neck of right femur, subsequent encounter for closed fracture with routine healing: Secondary | ICD-10-CM

## 2024-07-27 DIAGNOSIS — S7221XA Displaced subtrochanteric fracture of right femur, initial encounter for closed fracture: Secondary | ICD-10-CM

## 2024-07-27 HISTORY — PX: INTRAMEDULLARY (IM) NAIL INTERTROCHANTERIC: SHX5875

## 2024-07-27 LAB — GLUCOSE, CAPILLARY
Glucose-Capillary: 143 mg/dL — ABNORMAL HIGH (ref 70–99)
Glucose-Capillary: 131 mg/dL — ABNORMAL HIGH (ref 70–99)
Glucose-Capillary: 152 mg/dL — ABNORMAL HIGH (ref 70–99)
Glucose-Capillary: 236 mg/dL — ABNORMAL HIGH (ref 70–99)
Glucose-Capillary: 224 mg/dL — ABNORMAL HIGH (ref 70–99)

## 2024-07-27 LAB — CBC
HCT: 42.5 % (ref 39.0–52.0)
HCT: 32.2 % — ABNORMAL LOW (ref 39.0–52.0)
Hemoglobin: 14.3 g/dL (ref 13.0–17.0)
Hemoglobin: 10.6 g/dL — ABNORMAL LOW (ref 13.0–17.0)
MCH: 30.5 pg (ref 26.0–34.0)
MCH: 29.9 pg (ref 26.0–34.0)
MCHC: 33.6 g/dL (ref 30.0–36.0)
MCHC: 32.9 g/dL (ref 30.0–36.0)
MCV: 90.6 fL (ref 80.0–100.0)
MCV: 90.7 fL (ref 80.0–100.0)
Platelets: 208 K/uL (ref 150–400)
Platelets: 166 K/uL (ref 150–400)
RBC: 4.69 MIL/uL (ref 4.22–5.81)
RBC: 3.55 MIL/uL — ABNORMAL LOW (ref 4.22–5.81)
RDW: 14.2 % (ref 11.5–15.5)
RDW: 14.5 % (ref 11.5–15.5)
WBC: 11.8 K/uL — ABNORMAL HIGH (ref 4.0–10.5)
WBC: 15.2 K/uL — ABNORMAL HIGH (ref 4.0–10.5)
nRBC: 0 % (ref 0.0–0.2)
nRBC: 0 % (ref 0.0–0.2)

## 2024-07-27 LAB — HEMOGLOBIN A1C
Hgb A1c MFr Bld: 8.5 % — ABNORMAL HIGH (ref 4.8–5.6)
Mean Plasma Glucose: 197.25 mg/dL

## 2024-07-27 LAB — BASIC METABOLIC PANEL WITH GFR
Anion gap: 13 (ref 5–15)
BUN: 24 mg/dL — ABNORMAL HIGH (ref 8–23)
CO2: 22 mmol/L (ref 22–32)
Calcium: 9.3 mg/dL (ref 8.9–10.3)
Chloride: 102 mmol/L (ref 98–111)
Creatinine, Ser: 1.34 mg/dL — ABNORMAL HIGH (ref 0.61–1.24)
GFR, Estimated: 52 mL/min — ABNORMAL LOW
Glucose, Bld: 162 mg/dL — ABNORMAL HIGH (ref 70–99)
Potassium: 3.7 mmol/L (ref 3.5–5.1)
Sodium: 136 mmol/L (ref 135–145)

## 2024-07-27 LAB — SURGICAL PCR SCREEN
MRSA, PCR: NEGATIVE
Staphylococcus aureus: NEGATIVE

## 2024-07-27 MED ORDER — ORAL CARE MOUTH RINSE: 15.0000 mL | Freq: Once | OROMUCOSAL | Status: AC

## 2024-07-27 MED ORDER — LACTATED RINGERS IV BOLUS
500.0000 mL | Freq: Once | INTRAVENOUS | Status: AC
  Administered 2024-07-27: 500 mL via INTRAVENOUS

## 2024-07-27 MED ORDER — SUGAMMADEX SODIUM 200 MG/2ML IV SOLN
INTRAVENOUS | Status: DC | PRN
  Administered 2024-07-27: 150 mg via INTRAVENOUS

## 2024-07-27 MED ORDER — LIDOCAINE 2% (20 MG/ML) 5 ML SYRINGE
INTRAMUSCULAR | Status: DC | PRN
  Administered 2024-07-27: 40 mg via INTRAVENOUS

## 2024-07-27 MED ORDER — OXYCODONE HCL 5 MG/5ML PO SOLN: 5.0000 mg | Freq: Once | ORAL | Status: DC | PRN

## 2024-07-27 MED ORDER — CEFAZOLIN SODIUM-DEXTROSE 2-4 GM/100ML-% IV SOLN
INTRAVENOUS | Status: AC
  Filled 2024-07-27: qty 100

## 2024-07-27 MED ORDER — CEFAZOLIN SODIUM-DEXTROSE 2-3 GM-%(50ML) IV SOLR
INTRAVENOUS | Status: DC | PRN
  Administered 2024-07-27: 2 g via INTRAVENOUS

## 2024-07-27 MED ORDER — ONDANSETRON HCL 4 MG/2ML IJ SOLN: 4.0000 mg | Freq: Once | INTRAMUSCULAR | Status: DC | PRN

## 2024-07-27 MED ORDER — OXYCODONE HCL 5 MG PO TABS: 5.0000 mg | ORAL_TABLET | Freq: Once | ORAL | Status: DC | PRN

## 2024-07-27 MED ORDER — INSULIN ASPART 100 UNIT/ML IJ SOLN: 0.0000 [IU] | INTRAMUSCULAR | Status: DC | PRN

## 2024-07-27 MED ORDER — FENTANYL CITRATE (PF) 250 MCG/5ML IJ SOLN
INTRAMUSCULAR | Status: AC
  Filled 2024-07-27: qty 5

## 2024-07-27 MED ORDER — PHENYLEPHRINE 80 MCG/ML (10ML) SYRINGE FOR IV PUSH (FOR BLOOD PRESSURE SUPPORT)
PREFILLED_SYRINGE | INTRAVENOUS | Status: DC | PRN
  Administered 2024-07-27 (×4): 160 ug via INTRAVENOUS

## 2024-07-27 MED ORDER — PANTOPRAZOLE SODIUM 40 MG PO TBEC
40.0000 mg | DELAYED_RELEASE_TABLET | Freq: Two times a day (BID) | ORAL | Status: DC
  Administered 2024-07-27 – 2024-07-31 (×9): 40 mg via ORAL
  Filled 2024-07-27 (×10): qty 1

## 2024-07-27 MED ORDER — LIDOCAINE 2% (20 MG/ML) 5 ML SYRINGE
INTRAMUSCULAR | Status: AC
  Filled 2024-07-27: qty 5

## 2024-07-27 MED ORDER — PHENYLEPHRINE HCL-NACL 20-0.9 MG/250ML-% IV SOLN
INTRAVENOUS | Status: DC | PRN
  Administered 2024-07-27: 35 ug/min via INTRAVENOUS

## 2024-07-27 MED ORDER — BALSALAZIDE DISODIUM 750 MG PO CAPS
1500.0000 mg | ORAL_CAPSULE | Freq: Two times a day (BID) | ORAL | Status: DC
  Administered 2024-07-27 – 2024-08-02 (×12): 1500 mg via ORAL
  Filled 2024-07-27 (×17): qty 2

## 2024-07-27 MED ORDER — GLUCERNA SHAKE PO LIQD
237.0000 mL | Freq: Three times a day (TID) | ORAL | Status: DC
  Administered 2024-07-28 – 2024-07-30 (×4): 237 mL via ORAL

## 2024-07-27 MED ORDER — ADULT MULTIVITAMIN W/MINERALS CH
1.0000 | ORAL_TABLET | Freq: Every day | ORAL | Status: DC
  Administered 2024-07-28 – 2024-08-02 (×6): 1 via ORAL
  Filled 2024-07-27 (×6): qty 1

## 2024-07-27 MED ORDER — LACTATED RINGERS IV SOLN: INTRAVENOUS | Status: DC

## 2024-07-27 MED ORDER — INSULIN ASPART 100 UNIT/ML IJ SOLN
0.0000 [IU] | Freq: Three times a day (TID) | INTRAMUSCULAR | Status: DC
  Administered 2024-07-27: 3 [IU] via SUBCUTANEOUS
  Administered 2024-07-28: 1 [IU] via SUBCUTANEOUS
  Filled 2024-07-27: qty 3
  Filled 2024-07-27: qty 1

## 2024-07-27 MED ORDER — ALBUMIN HUMAN 5 % IV SOLN: INTRAVENOUS | Status: DC | PRN

## 2024-07-27 MED ORDER — PROPOFOL 10 MG/ML IV BOLUS
INTRAVENOUS | Status: DC | PRN
  Administered 2024-07-27: 100 mg via INTRAVENOUS

## 2024-07-27 MED ORDER — ONDANSETRON HCL 4 MG/2ML IJ SOLN
INTRAMUSCULAR | Status: DC | PRN
  Administered 2024-07-27: 4 mg via INTRAVENOUS

## 2024-07-27 MED ORDER — HYDROMORPHONE HCL 1 MG/ML IJ SOLN: 0.2500 mg | INTRAMUSCULAR | Status: DC | PRN

## 2024-07-27 MED ORDER — AMISULPRIDE (ANTIEMETIC) 5 MG/2ML IV SOLN: 10.0000 mg | Freq: Once | INTRAVENOUS | Status: DC | PRN

## 2024-07-27 MED ORDER — PROPOFOL 10 MG/ML IV BOLUS
INTRAVENOUS | Status: AC
  Filled 2024-07-27: qty 20

## 2024-07-27 MED ORDER — LACTATED RINGERS IV BOLUS: 500.0000 mL | Freq: Once | INTRAVENOUS | Status: DC

## 2024-07-27 MED ORDER — ROCURONIUM BROMIDE 10 MG/ML (PF) SYRINGE
PREFILLED_SYRINGE | INTRAVENOUS | Status: AC
  Filled 2024-07-27: qty 10

## 2024-07-27 MED ORDER — CHLORHEXIDINE GLUCONATE 0.12 % MT SOLN
OROMUCOSAL | Status: AC
  Administered 2024-07-27: 15 mL via OROMUCOSAL
  Filled 2024-07-27: qty 15

## 2024-07-27 MED ORDER — CHLORHEXIDINE GLUCONATE 0.12 % MT SOLN: 15.0000 mL | Freq: Once | OROMUCOSAL | Status: AC

## 2024-07-27 MED ORDER — PHENYLEPHRINE 80 MCG/ML (10ML) SYRINGE FOR IV PUSH (FOR BLOOD PRESSURE SUPPORT)
PREFILLED_SYRINGE | INTRAVENOUS | Status: AC
  Filled 2024-07-27: qty 10

## 2024-07-27 MED ORDER — FENTANYL CITRATE (PF) 250 MCG/5ML IJ SOLN
INTRAMUSCULAR | Status: DC | PRN
  Administered 2024-07-27: 50 ug via INTRAVENOUS
  Administered 2024-07-27: 100 ug via INTRAVENOUS

## 2024-07-27 MED ORDER — ROCURONIUM BROMIDE 10 MG/ML (PF) SYRINGE
PREFILLED_SYRINGE | INTRAVENOUS | Status: DC | PRN
  Administered 2024-07-27: 50 mg via INTRAVENOUS
  Administered 2024-07-27: 20 mg via INTRAVENOUS

## 2024-07-27 MED ORDER — ONDANSETRON HCL 4 MG/2ML IJ SOLN
INTRAMUSCULAR | Status: AC
  Filled 2024-07-27: qty 2

## 2024-07-27 NOTE — Progress Notes (Signed)
 Verbal order to give Cardizem  120mg  with sip of water before surgery per CRNA

## 2024-07-27 NOTE — TOC CAGE-AID Note (Signed)
 Transition of Care [facility]) - CAGE-AID Screening   Patient Details  Name: Timothy Mclean MRN: [id number] Date of Birth: 03/02/1941  [facility]) CM/SW Contact:    Analysa Nutting E Cameren Odwyer, LCSW Phone Number: 07/27/2024, 11:51 AM   Clinical Narrative: No SA noted.   CAGE-AID Screening:    Have You Ever Felt You Ought to Cut Down on Your Drinking or Drug Use?: No Have People Annoyed You By Critizing Your Drinking Or Drug Use?: No Have You Felt Bad Or Guilty About Your Drinking Or Drug Use?: No Have You Ever Had a Drink or Used Drugs First Thing In The Morning to Steady Your Nerves or to Get Rid of a Hangover?: No CAGE-AID Score: 0  Substance Abuse Education Offered: No

## 2024-07-27 NOTE — Plan of Care (Signed)
" °  Problem: Education: Goal: Knowledge of General Education information will improve Description: Including pain rating scale, medication(s)/side effects and non-pharmacologic comfort measures Outcome: Progressing   Problem: Health Behavior/Discharge Planning: Goal: Ability to manage health-related needs will improve Outcome: Progressing   Problem: Clinical Measurements: Goal: Ability to maintain clinical measurements within normal limits will improve Outcome: Progressing Goal: Will remain free from infection Outcome: Progressing Goal: Diagnostic test results will improve Outcome: Progressing Goal: Respiratory complications will improve Outcome: Progressing Goal: Cardiovascular complication will be avoided Outcome: Progressing   Problem: Activity: Goal: Risk for activity intolerance will decrease Outcome: Progressing   Problem: Nutrition: Goal: Adequate nutrition will be maintained Outcome: Progressing   Problem: Coping: Goal: Level of anxiety will decrease Outcome: Progressing   Problem: Elimination: Goal: Will not experience complications related to bowel motility Outcome: Progressing Goal: Will not experience complications related to urinary retention Outcome: Progressing   Problem: Pain Managment: Goal: General experience of comfort will improve and/or be controlled Outcome: Progressing   Problem: Safety: Goal: Ability to remain free from injury will improve Outcome: Progressing   Problem: Skin Integrity: Goal: Risk for impaired skin integrity will decrease Outcome: Progressing   Problem: Education: Goal: Knowledge of the prescribed therapeutic regimen will improve Outcome: Progressing   Problem: Bowel/Gastric: Goal: Gastrointestinal status for postoperative course will improve Outcome: Progressing   Problem: Cardiac: Goal: Ability to maintain an adequate cardiac output Outcome: Progressing Goal: Will show no evidence of cardiac arrhythmias Outcome:  Progressing   Problem: Nutritional: Goal: Will attain and maintain optimal nutritional status Outcome: Progressing   Problem: Neurological: Goal: Will regain or maintain usual level of consciousness Outcome: Progressing   Problem: Clinical Measurements: Goal: Ability to maintain clinical measurements within normal limits Outcome: Progressing Goal: Postoperative complications will be avoided or minimized Outcome: Progressing   Problem: Respiratory: Goal: Will regain and/or maintain adequate ventilation Outcome: Progressing Goal: Respiratory status will improve Outcome: Progressing   Problem: Skin Integrity: Goal: Demonstrates signs of wound healing without infection Outcome: Progressing   Problem: Urinary Elimination: Goal: Will remain free from infection Outcome: Progressing Goal: Ability to achieve and maintain adequate urine output Outcome: Progressing   Problem: Education: Goal: Ability to describe self-care measures that may prevent or decrease complications (Diabetes Survival Skills Education) will improve Outcome: Progressing   Problem: Coping: Goal: Ability to adjust to condition or change in health will improve Outcome: Progressing   Problem: Fluid Volume: Goal: Ability to maintain a balanced intake and output will improve Outcome: Progressing   Problem: Health Behavior/Discharge Planning: Goal: Ability to identify and utilize available resources and services will improve Outcome: Progressing Goal: Ability to manage health-related needs will improve Outcome: Progressing   Problem: Metabolic: Goal: Ability to maintain appropriate glucose levels will improve Outcome: Progressing   Problem: Nutritional: Goal: Maintenance of adequate nutrition will improve Outcome: Progressing Goal: Progress toward achieving an optimal weight will improve Outcome: Progressing   Problem: Skin Integrity: Goal: Risk for impaired skin integrity will decrease Outcome:  Progressing   Problem: Tissue Perfusion: Goal: Adequacy of tissue perfusion will improve Outcome: Progressing   "

## 2024-07-27 NOTE — Progress Notes (Signed)
" ° ° ° °  Patient Name: Timothy Mclean           DOB: 03/03/1941  MRN: [id number]      Admission Date: 07/26/2024  Attending Provider: Cherlyn Labella, MD  Primary Diagnosis: Closed right hip fracture, initial encounter [facility])   Level of care: Telemetry   OVERNIGHT EVENT   Notified of patient being hypotensive BP 80/50s.   BP has been low since coming back from ortho procedure. 1/8- Right intertrochanteric femur fracture fixation with intramedullary nail.  He is currently alert and oriented to self only.  No other acute changes reported by nursing staff.  All other vitals stable.  Afebrile. No bleeding from/ around surgical site.  Prior hemoglobin 14.3.  Will recheck CBC.   Plan: Fluid bolus given for hypotension CBC   Addendum: 2340- BP improving, 103/56.  Hemoglobin 14.3--> 10.6, continue to monitor.   Deriona Altemose, DNP, ACNPC- AG [facility] [place]    "

## 2024-07-27 NOTE — Progress Notes (Signed)
 Initial Nutrition Assessment  DOCUMENTATION CODES:  Not applicable  INTERVENTION:  Regular diet post surgery. Do not restrict diet so as to encourage PO intake. Glucerna Shake PO TID. Each supplement provides 220 Kcals and 10 grams of protein. Multivitamin PO once daily.  NUTRITION DIAGNOSIS:  Increased nutrient needs related to hip fracture as evidenced by estimated needs   GOAL:  Patient will meet greater than or equal to 90% of their needs   MONITOR:  PO intake, Supplement acceptance, Labs, Diet advancement, Weight trends  REASON FOR ASSESSMENT:  Consult Hip fracture protocol  ASSESSMENT:  Patient presented with R intertrochanteric femoral fracture s/p fall. PMH significant for dementia with baseline disorientation, COPD, Afib STEMI, CAD s/p CABG, CKD2, DM2, GERD, Barrett's esophagus, ulcerative colitis, hypothyroidism, and HOH.  The patient is currently in the OR. Moderate malnutrition noted from 2022 during admission for PNA. No recent nutrition issues reported.  Scheduled Meds:  [MAR Hold] balsalazide  1,500 mg Oral BID   [MAR Hold] Chlorhexidine  Gluconate Cloth  6 each Topical Daily   [MAR Hold] diltiazem   120 mg Oral q AM   [MAR Hold] finasteride   5 mg Oral Daily   [MAR Hold] fluticasone  furoate-vilanterol  1 puff Inhalation Daily   [MAR Hold] levothyroxine   75 mcg Oral Daily   [MAR Hold] pantoprazole  (PROTONIX ) IV  40 mg Intravenous Q12H   Continuous Infusions:  sodium chloride  50 mL/hr at 07/26/24 2224   ceFAZolin      lactated ringers  10 mL/hr at 07/27/24 0804   PRN Meds:.[MAR Hold] albuterol , [MAR Hold] bisacodyl , ceFAZolin , [MAR Hold] HYDROcodone -acetaminophen , insulin  aspart, [MAR Hold] methocarbamol  **OR** [MAR Hold] methocarbamol  (ROBAXIN ) injection, [MAR Hold]  morphine  injection, [MAR Hold] polyethylene glycol  Diet Order             Diet NPO time specified  Diet effective midnight                  Meal Intake: N/A  Labs:     Latest Ref Rng  & Units 07/27/2024    6:27 AM 07/26/2024    8:56 PM 07/26/2024    3:20 PM  CMP  Glucose 70 - 99 mg/dL 837  779  833   BUN 8 - 23 mg/dL 24  24  31    Creatinine 0.61 - 1.24 mg/dL 8.65  8.53  8.49   Sodium 135 - 145 mmol/L 136  135  134   Potassium 3.5 - 5.1 mmol/L 3.7  4.6  5.3   Chloride 98 - 111 mmol/L 102  101  103   CO2 22 - 32 mmol/L 22  23    Calcium  8.9 - 10.3 mg/dL 9.3  9.4      I/O: -899 mL since admit  NUTRITION - FOCUSED PHYSICAL EXAM: Deferred as patient is currently in OR  EDUCATION NEEDS:  Not appropriate for education at this time  Skin:  Skin Assessment: Reviewed RN Assessment (anticipated incision)  Last BM:  none charted  Height:  Ht Readings from Last 1 Encounters:  07/27/24 6' (1.829 m)   Weight:  Wt Readings from Last 10 Encounters:  07/27/24 74.8 kg  08/10/23 68 kg  02/08/23 68.3 kg  06/24/22 75 kg  08/24/21 75.8 kg  07/04/21 75.4 kg  05/03/21 73.9 kg  05/28/20 76 kg  12/05/19 78.2 kg  05/22/19 78 kg   Weight Change: stable weight noted  Usual Body Weight: unable to determine  Edema: +1 RLE  Ideal Body Weight:  81 kg  BMI:  Body mass index is 22.37 kg/m.  Estimated Daily Nutritional Needs:  Kcal:  2000-2200 Protein:  110-125 g Fluid:  >/=2000 mL    Leverne Ruth, MS, RDN, LDN [place] See AMION for contact information Secure chat preferred

## 2024-07-27 NOTE — Progress Notes (Signed)
 PT Cancellation Note  Patient Details Name: Timothy Mclean MRN: [id number] DOB: [birth year]   Cancelled Treatment:    Reason Eval/Treat Not Completed: Patient at procedure or test/unavailable   Aleck Daring, PT, DPT Acute Rehabilitation Services Office [phone]    [name] 07/27/2024, 8:02 AM

## 2024-07-27 NOTE — Consult Note (Addendum)
 "             Orthopaedic Trauma Service (OTS) Consultation   Patient ID: Timothy Mclean MRN: [id number] DOB/AGE: Nov 19, 1940 84 y.o.   Reason for Consult: right hip fracture Referring Physician: Mario Blanch, MD  HPI: Timothy Mclean is an 84 y.o. male who fell with right hip pain and inability to ambulate afterward. He uses a walker but seldom, and is primarily bed to chair. Pain is  moderately well controlled now, aching and dull, sharp and severe with motion, without associated distal tingling or numbness, and improved with narcotics.    Past Medical History:  Diagnosis Date   Allergy    Anxiety    Asthma    since 2013   Barrett's esophagus 03/09/2014   Bilateral lumbar radiculopathy    since 2013   CAD S/P percutaneous coronary angioplasty 1987   a. 1987 angioplasty and BMS- Cx; b. 1997 CABG x 4;  c. MYOVIEW  1/'05: ? Ischemia vs. artifact --> sent for cath - patent grafts.   Chronic atrial fibrillation (HCC)    a. Prev on amiodarone -->d/c 08/2016 in setting of recurrent AF, LFT abnormalities, and pulmonary symptoms;  b. CHA2DS2VASc = 4-->eliquis .   Chronic bronchitis (HCC)    since 2007   COPD (chronic obstructive pulmonary disease) (HCC)    DDD (degenerative disc disease)    Diverticulosis    Dyslipidemia, goal LDL below 70    Doing better off of Zocor, currently on Lescol    ED (erectile dysfunction)    Elevated LFTs    Esophageal reflux    GERD (gastroesophageal reflux disease)    Gout, unspecified    Hearing loss    Helicobacter pylori gastritis 07/2009   Herpes zoster 2009   Hypothyroidism    since 2010   Iron deficiency anemia, unspecified    Leg weakness    a. 09/2016 ABI's: R 1.05, L 0.98.   Other and unspecified hyperlipidemia    Other B-complex deficiencies    Personal history of colonic polyps    Prostatitis    chronic ulcerative   Pulmonary fibrosis (HCC)    and bronchiectasis, since 2015   Renal stone    Right   S/P CABG x 4 1997   a) LIMA-LAD, SVG to  OM, SVG-dRCA-RPL; b) FALSE + MYOVIEW  --> CATH 1/'05: 100% LAD after widelly patent D1, all Cx OM branches 100%, pRCA 100%, SVG-PDA patent w/ retrograde filling of RPL, SVG-OM widely patent ~ normal OM, LIMA-LAD patent.   Spinal stenosis    ST elevation myocardial infarction (STEMI) of inferior wall, subsequent episode of care (HCC) 1987, 1997   a) PTCA of Cx; b) CABG    Type II or unspecified type diabetes mellitus without mention of complication, not stated as uncontrolled    no meds   Ulcerative colitis, unspecified    Unspecified essential hypertension    Vitamin D  deficiency     Past Surgical History:  Procedure Laterality Date   BLEPHAROPLASTY Bilateral    for ptosis   CARDIAC CATHETERIZATION  08/17/2003   False positive stress test  grafts patent; RCA proximal 100% LAD 100% occlusion after normal D1 with 80% ostial as SP1; circumflex patent but OM1 on to all occluded; EF 50-55%   CARDIAC CATHETERIZATION  11/1995   Preop CABG: LAD 90% at D1, circumflex 100 and OM a 90% proximal, 80% distal   Carotid Duplex Doppler  12/23/2009   Right&Left ICAs 0-49%, mildly abnormal study,    CHOLECYSTECTOMY  COLONOSCOPY     CORONARY ARTERY BYPASS GRAFT  11/1995   LIMA-LAD, SVG to OM, SVG-dRCA-RPL   ESOPHAGOGASTRODUODENOSCOPY     FLEXIBLE SIGMOIDOSCOPY     NM MYOCAR PERF EJECTION FRACTION  07/03/2003   FALSE POSITIVE TEST; Bruce protocol, negative test with scintigraphic evidence of inferoapical scar, diaphragmatic attenuation, moderate ischemia.   NM MYOVIEW  LTD  2005   Questionable ischemia that is likely either infarct versus artifact; normal   TRANSTHORACIC ECHOCARDIOGRAM  12/2017   EF 55-60%.  Unable to assess diastolic function due to A. fib.  Moderate LA/RA dilation.  Mild MR.    Family History  Problem Relation Age of Onset   Diabetes Mother    Congestive Heart Failure Mother    Dementia Father    Pneumonia Father        died of this at age 65, hx of bronchiectasis   Heart  disease Sister    Diabetes Sister    Lung cancer Brother        died at age 6   Hypertension Sister    CVA Sister    Colon cancer Neg Hx    Esophageal cancer Neg Hx    Rectal cancer Neg Hx    Stomach cancer Neg Hx     Social History:  reports that he quit smoking about 39 years ago. His smoking use included cigarettes. He started smoking about 71 years ago. He has a 32 pack-year smoking history. He has never used smokeless tobacco. He reports that he does not drink alcohol and does not use drugs.  Allergies: Allergies[1]  Medications: Prior to Admission:  Medications Prior to Admission  Medication Sig Dispense Refill Last Dose/Taking   apixaban  (ELIQUIS ) 5 MG TABS tablet Take 1 tablet (5 mg total) by mouth 2 (two) times daily. 180 tablet 0 07/26/2024   Dulaglutide  (TRULICITY ) 3 MG/0.5ML SOAJ inject 3 mg by subcutaneous route once weekly 2 mL 1 07/26/2024   empagliflozin  (JARDIANCE ) 25 MG TABS tablet Take 1 tablet (25 mg total) by mouth every morning. 90 tablet 1 07/26/2024   JANUVIA 100 MG tablet Take 100 mg by mouth daily.   07/26/2024   ADVAIR  DISKUS 250-50 MCG/DOSE AEPB Inhale 1 puff into the lungs 2 (two) times daily.       albuterol  (PROVENTIL  HFA;VENTOLIN  HFA) 108 (90 Base) MCG/ACT inhaler Inhale into the lungs every 6 (six) hours as needed for wheezing or shortness of breath.      albuterol  (VENTOLIN  HFA) 108 (90 Base) MCG/ACT inhaler Inhale 2 puffs into the lungs every 4 (four) hours as needed for chest congestion, cough, shortness of breath, or wheezing 20.1 g 1    apixaban  (ELIQUIS ) 5 MG TABS tablet Take 1 tablet (5 mg total) by mouth 2 (two) times daily. 28 tablet 0    balsalazide (COLAZAL ) 750 MG capsule TAKE 2 CAPSULES BY MOUTH TWICE DAILY (Patient taking differently: Take 2,250 mg by mouth in the morning and at bedtime.) 360 capsule 3    balsalazide (COLAZAL ) 750 MG capsule Take 2 capsules (1,500 mg total) by mouth 2 (two) times daily. 360 capsule 3    Blood Glucose Monitoring  Suppl (ONE TOUCH ULTRA 2) w/Device KIT SMARTSIG:Via Meter      chlorhexidine  (HIBICLENS ) 4 % external liquid Apply topically daily as needed. 236 mL 0    Cholecalciferol  (VITAMIN D3) 50 MCG (2000 UT) capsule Take 2,000 Units by mouth in the morning and at bedtime.      cyanocobalamin  1000 MCG tablet  Take 1,000 mcg by mouth in the morning and at bedtime.      diltiazem  (CARDIZEM  CD) 120 MG 24 hr capsule Take 1 capsule (120 mg total) by mouth in the morning. 90 capsule 3    diltiazem  (CARDIZEM  CD) 180 MG 24 hr capsule TAKE 1 CAPSULE BY MOUTH DAILY 90 capsule 3    Dulaglutide  (TRULICITY ) 1.5 MG/0.5ML SOAJ Inject 1.5 mg as directed once a week. 6 mL 1    Dulaglutide  (TRULICITY ) 3 MG/0.5ML SOAJ Inject 3 mg into the skin once a week. 2 mL 5    Dulaglutide  (TRULICITY ) 3 MG/0.5ML SOAJ Inject 3 mg into the skin once a week. 2 mL 11    EPINEPHrine  (EPIPEN  2-PAK) 0.3 mg/0.3 mL IJ SOAJ injection Inject 0.3 mg into the muscle as needed for allergic reaction 2 each 2    EPINEPHrine  0.3 mg/0.3 mL IJ SOAJ injection Inject 0.3 mg into the muscle as needed.      ezetimibe  (ZETIA ) 10 MG tablet Take 10 mg by mouth daily.      ezetimibe  (ZETIA ) 10 MG tablet Take 1 tablet (10 mg total) by mouth daily. 90 tablet 3    finasteride  (PROSCAR ) 5 MG tablet Take 5 mg by mouth daily.      finasteride  (PROSCAR ) 5 MG tablet Take 1 tablet (5 mg total) by mouth daily. 90 tablet 3    fluticasone -salmeterol (ADVAIR ) 250-50 MCG/ACT AEPB Inhale 1 puff into the lungs 2 (two) times daily. 180 each 3    furosemide  (LASIX ) 20 MG tablet Take 20 mg by mouth daily.      insulin  aspart (NOVOLOG ) 100 UNIT/ML injection Inject 6 Units into the skin 3 (three) times daily with meals. 10 mL 11    insulin  aspart (NOVOLOG ) 100 UNIT/ML injection Inject 0-9 Units into the skin 3 (three) times daily with meals. 0-9 Units, Subcutaneous, 3 times daily with meals,  CBG < 70: Implement Hypoglycemia measuresCBG 70 - 120: 0 units CBG 121 - 150: 1 unit CBG 151 -  200: 2 units CBG 201 - 250: 3 units CBG 251 - 300: 5 units CBG 301 - 350: 7 units CBG 351 - 400: 9 units CBG > 400: call MD 10 mL 11    Lancets (ONETOUCH DELICA PLUS LANCET30G) MISC       levothyroxine  (SYNTHROID ) 50 MCG tablet Take 50 mcg by mouth daily.      levothyroxine  (SYNTHROID ) 75 MCG tablet Take 1 tablet (75 mcg total) by mouth daily. 90 tablet 3    mupirocin  ointment (BACTROBAN ) 2 % Apply 1 Application topically 2 (two) times daily. 60 g 0    ONETOUCH ULTRA test strip       oxybutynin  (DITROPAN ) 5 MG tablet Take 5 mg by mouth daily.      oxybutynin  (DITROPAN ) 5 MG tablet Take 1 tablet (5 mg total) by mouth 2 (two) times daily. 180 tablet 3    pantoprazole  (PROTONIX ) 40 MG tablet Take 1 tablet by mouth 2 (two) times daily.       pantoprazole  (PROTONIX ) 40 MG tablet Take 1 tablet (40 mg total) by mouth 2 (two) times daily - 1/2-1 hour before meals 180 tablet 3    rosuvastatin  (CRESTOR ) 40 MG tablet Take 1 tablet (40 mg total) by mouth every evening after a meal 90 tablet 3    terbinafine  (LAMISIL ) 250 MG tablet Take 1 tablet (250 mg total) by mouth daily. 30 tablet 1     Results for orders placed or performed  during the hospital encounter of 07/26/24 (from the past 48 hours)  Comprehensive metabolic panel     Status: Abnormal   Collection Time: 07/26/24  3:12 PM  Result Value Ref Range   Sodium 133 (L) 135 - 145 mmol/L   Potassium 4.2 3.5 - 5.1 mmol/L   Chloride 101 98 - 111 mmol/L   CO2 20 (L) 22 - 32 mmol/L   Glucose, Bld 162 (H) 70 - 99 mg/dL    Comment: Glucose reference range applies only to samples taken after fasting for at least 8 hours.   BUN 22 8 - 23 mg/dL   Creatinine, Ser 8.59 (H) 0.61 - 1.24 mg/dL   Calcium  9.4 8.9 - 10.3 mg/dL   Total Protein 6.8 6.5 - 8.1 g/dL   Albumin  3.8 3.5 - 5.0 g/dL   AST 45 (H) 15 - 41 U/L    Comment: HEMOLYSIS AT THIS LEVEL MAY AFFECT RESULT   ALT 23 0 - 44 U/L   Alkaline Phosphatase 122 38 - 126 U/L   Total Bilirubin 0.5 0.0 - 1.2  mg/dL   GFR, Estimated 49 (L) >60 mL/min    Comment: (NOTE) Calculated using the CKD-EPI Creatinine Equation (2021)    Anion gap 12 5 - 15    Comment: Performed at [facility], [address], [place], [place] [address]  CBC     Status: None   Collection Time: 07/26/24  3:12 PM  Result Value Ref Range   WBC 8.1 4.0 - 10.5 K/uL   RBC 4.96 4.22 - 5.81 MIL/uL   Hemoglobin 15.1 13.0 - 17.0 g/dL   HCT 55.0 60.9 - 47.9 %   MCV 90.5 80.0 - 100.0 fL   MCH 30.4 26.0 - 34.0 pg   MCHC 33.6 30.0 - 36.0 g/dL   RDW 85.7 88.4 - 84.4 %   Platelets 241 150 - 400 K/uL   nRBC 0.0 0.0 - 0.2 %    Comment: Performed at [facility], [address], [place], [place] [address]  Ethanol     Status: None   Collection Time: 07/26/24  3:12 PM  Result Value Ref Range   Alcohol, Ethyl (B) <15 <15 mg/dL    Comment: (NOTE) For medical purposes only. Performed at [facility], [address] [place], [place] [address]   Protime-INR     Status: Abnormal   Collection Time: 07/26/24  3:12 PM  Result Value Ref Range   Prothrombin Time 17.1 (H) 11.4 - 15.2 seconds   INR 1.3 (H) 0.8 - 1.2    Comment: (NOTE) INR goal varies based on device and disease states. Performed at [facility], [address], [place], [place] [address]   Sample to Blood Bank     Status: None   Collection Time: 07/26/24  3:12 PM  Result Value Ref Range   Blood Bank Specimen SAMPLE AVAILABLE FOR TESTING    Sample Expiration      07/29/2024,2359 Performed at [facility], [address] [place], [place] [address]   Type and screen [facility]     Status: None   Collection Time: 07/26/24  3:12 PM  Result Value Ref Range   ABO/RH(D) A POS    Antibody Screen NEG    Sample Expiration      07/29/2024,2359 Performed at [facility], [address] [place], [place] [address]   I-Stat Chem 8, ED  Status: Abnormal   Collection Time: 07/26/24  3:20 PM  Result Value Ref Range    Sodium 134 (L) 135 - 145 mmol/L   Potassium 5.3 (H) 3.5 - 5.1 mmol/L   Chloride 103 98 - 111 mmol/L   BUN 31 (H) 8 - 23 mg/dL   Creatinine, Ser 8.49 (H) 0.61 - 1.24 mg/dL   Glucose, Bld 833 (H) 70 - 99 mg/dL    Comment: Glucose reference range applies only to samples taken after fasting for at least 8 hours.   Calcium , Ion 0.99 (L) 1.15 - 1.40 mmol/L   TCO2 23 22 - 32 mmol/L   Hemoglobin 15.6 13.0 - 17.0 g/dL   HCT 53.9 60.9 - 47.9 %  I-Stat Lactic Acid, ED     Status: None   Collection Time: 07/26/24  3:20 PM  Result Value Ref Range   Lactic Acid, Venous 1.6 0.5 - 1.9 mmol/L  ABO/Rh     Status: None   Collection Time: 07/26/24  3:20 PM  Result Value Ref Range   ABO/RH(D)      A POS Performed at [facility], [address] [place], [place] [address]   Glucose, capillary     Status: Abnormal   Collection Time: 07/26/24  8:38 PM  Result Value Ref Range   Glucose-Capillary 226 (H) 70 - 99 mg/dL    Comment: Glucose reference range applies only to samples taken after fasting for at least 8 hours.  Basic metabolic panel     Status: Abnormal   Collection Time: 07/26/24  8:56 PM  Result Value Ref Range   Sodium 135 135 - 145 mmol/L   Potassium 4.6 3.5 - 5.1 mmol/L   Chloride 101 98 - 111 mmol/L   CO2 23 22 - 32 mmol/L   Glucose, Bld 220 (H) 70 - 99 mg/dL    Comment: Glucose reference range applies only to samples taken after fasting for at least 8 hours.   BUN 24 (H) 8 - 23 mg/dL   Creatinine, Ser 8.53 (H) 0.61 - 1.24 mg/dL   Calcium  9.4 8.9 - 10.3 mg/dL   GFR, Estimated 47 (L) >60 mL/min    Comment: (NOTE) Calculated using the CKD-EPI Creatinine Equation (2021)    Anion gap 11 5 - 15    Comment: Performed at [facility], [address] [place], [place] [address]  Glucose, capillary     Status: Abnormal   Collection Time: 07/26/24 11:22 PM  Result Value Ref Range   Glucose-Capillary 245 (H) 70 - 99 mg/dL    Comment: Glucose reference range applies only to  samples taken after fasting for at least 8 hours.  Surgical PCR screen     Status: None   Collection Time: 07/27/24 12:56 AM   Specimen: Nasal Mucosa; Nasal Swab  Result Value Ref Range   MRSA, PCR NEGATIVE NEGATIVE   Staphylococcus aureus NEGATIVE NEGATIVE    Comment: (NOTE) The Xpert SA Assay (FDA approved for NASAL specimens in patients 2 years of age and older), is one component of a comprehensive surveillance program. It is not intended to diagnose infection nor to guide or monitor treatment. Performed at [facility], [address], [place], [place] [address]   Glucose, capillary     Status: Abnormal   Collection Time: 07/27/24  3:54 AM  Result Value Ref Range   Glucose-Capillary 143 (H) 70 - 99 mg/dL    Comment: Glucose reference range applies only to samples  taken after fasting for at least 8 hours.  CBC     Status: Abnormal   Collection Time: 07/27/24  6:27 AM  Result Value Ref Range   WBC 11.8 (H) 4.0 - 10.5 K/uL   RBC 4.69 4.22 - 5.81 MIL/uL   Hemoglobin 14.3 13.0 - 17.0 g/dL   HCT 57.4 60.9 - 47.9 %   MCV 90.6 80.0 - 100.0 fL   MCH 30.5 26.0 - 34.0 pg   MCHC 33.6 30.0 - 36.0 g/dL   RDW 85.7 88.4 - 84.4 %   Platelets 208 150 - 400 K/uL   nRBC 0.0 0.0 - 0.2 %    Comment: Performed at [facility], [address], [place], [place] [address]  Glucose, capillary     Status: Abnormal   Collection Time: 07/27/24  6:27 AM  Result Value Ref Range   Glucose-Capillary 131 (H) 70 - 99 mg/dL    Comment: Glucose reference range applies only to samples taken after fasting for at least 8 hours.    DG Knee Right Port Result Date: 07/26/2024 EXAM: 1 or 2 VIEW(S) XRAY OF THE RIGHT KNEE 07/26/2024 10:17:57 PM COMPARISON: 08/03/2011 CLINICAL HISTORY: [id number] Closed fracture of shaft of right femur, unspecified fracture morphology, initial encounter [facility]) [id number] FINDINGS: BONES AND JOINTS: No acute fracture. No malalignment. No significant joint effusion. Moderate to  advanced tricompartmental degenerative changes. SOFT TISSUES: Diffuse vascular calcifications. IMPRESSION: 1. No acute findings. 2. Moderate to advanced tricompartmental degenerative changes. Electronically signed by: Franky Crease MD MD 07/26/2024 10:22 PM EST RP Workstation: [id number]   CT HEAD WO CONTRAST Result Date: 07/26/2024 CLINICAL DATA:  Trauma fall altered EXAM: CT HEAD WITHOUT CONTRAST CT CERVICAL SPINE WITHOUT CONTRAST TECHNIQUE: Multidetector CT imaging of the head and cervical spine was performed following the standard protocol without intravenous contrast. Multiplanar CT image reconstructions of the cervical spine were also generated. RADIATION DOSE REDUCTION: This exam was performed according to the departmental dose-optimization program which includes automated exposure control, adjustment of the mA and/or kV according to patient size and/or use of iterative reconstruction technique. COMPARISON:  CT brain 12/08/2023 FINDINGS: CT HEAD FINDINGS Brain: No acute territorial infarction, hemorrhage or intracranial mass. Atrophy and chronic small vessel ischemic changes of the white matter. Small left chronic parietooccipital infarct. The ventricles are non enlarged. Vascular: No hyperdense vessels. Vertebral and carotid vascular calcification Skull: None no fracture Sinuses/Orbits: No acute finding. Other: None CT CERVICAL SPINE FINDINGS Alignment: Trace anterolisthesis C4 on C5, C5 on C6 and C7 on T1. Facet alignment is normal Skull base and vertebrae: No acute fracture. No primary bone lesion or focal pathologic process. Soft tissues and spinal canal: No prevertebral fluid or swelling. No visible canal hematoma. Disc levels: Multilevel degenerative changes. Advanced disc space narrowing C5-C6 and C6-C7 with moderate disc space narrowing at C7-T1. Multilevel hypertrophic facet degenerative changes with foraminal narrowing. Upper chest: Apical emphysema Other: None IMPRESSION: 1. No CT evidence for  acute intracranial abnormality. Atrophy and chronic small vessel ischemic changes of the white matter. 2. Multilevel degenerative changes of the cervical spine. No acute osseous abnormality. Electronically Signed   By: Luke Bun M.D.   On: 07/26/2024 16:34   CT CERVICAL SPINE WO CONTRAST Result Date: 07/26/2024 CLINICAL DATA:  Trauma fall altered EXAM: CT HEAD WITHOUT CONTRAST CT CERVICAL SPINE WITHOUT CONTRAST TECHNIQUE: Multidetector CT imaging of the head and cervical spine was performed following the standard protocol without intravenous contrast. Multiplanar CT image reconstructions of the cervical spine  were also generated. RADIATION DOSE REDUCTION: This exam was performed according to the departmental dose-optimization program which includes automated exposure control, adjustment of the mA and/or kV according to patient size and/or use of iterative reconstruction technique. COMPARISON:  CT brain 12/08/2023 FINDINGS: CT HEAD FINDINGS Brain: No acute territorial infarction, hemorrhage or intracranial mass. Atrophy and chronic small vessel ischemic changes of the white matter. Small left chronic parietooccipital infarct. The ventricles are non enlarged. Vascular: No hyperdense vessels. Vertebral and carotid vascular calcification Skull: None no fracture Sinuses/Orbits: No acute finding. Other: None CT CERVICAL SPINE FINDINGS Alignment: Trace anterolisthesis C4 on C5, C5 on C6 and C7 on T1. Facet alignment is normal Skull base and vertebrae: No acute fracture. No primary bone lesion or focal pathologic process. Soft tissues and spinal canal: No prevertebral fluid or swelling. No visible canal hematoma. Disc levels: Multilevel degenerative changes. Advanced disc space narrowing C5-C6 and C6-C7 with moderate disc space narrowing at C7-T1. Multilevel hypertrophic facet degenerative changes with foraminal narrowing. Upper chest: Apical emphysema Other: None IMPRESSION: 1. No CT evidence for acute intracranial  abnormality. Atrophy and chronic small vessel ischemic changes of the white matter. 2. Multilevel degenerative changes of the cervical spine. No acute osseous abnormality. Electronically Signed   By: Luke Bun M.D.   On: 07/26/2024 16:34   CT ABDOMEN PELVIS W CONTRAST Result Date: 07/26/2024 EXAM: CT ABDOMEN AND PELVIS WITH CONTRAST 07/26/2024 04:11:00 PM TECHNIQUE: CT of the abdomen and pelvis was performed with the administration of 75 mL of iohexol  (OMNIPAQUE ) 350 MG/ML injection. Multiplanar reformatted images are provided for review. Automated exposure control, iterative reconstruction, and/or weight-based adjustment of the mA/kV was utilized to reduce the radiation dose to as low as reasonably achievable. COMPARISON: CT abdomen and pelvis 01/32/2018. CLINICAL HISTORY: Polytrauma, blunt. FINDINGS: LOWER CHEST: No acute abnormality. LIVER: The liver is unremarkable. GALLBLADDER AND BILE DUCTS: Gallbladder surgically absent. No biliary ductal dilatation. SPLEEN: No acute abnormality. PANCREAS: No acute abnormality. ADRENAL GLANDS: No acute abnormality. KIDNEYS, URETERS AND BLADDER: Moderate bilateral hydroureteronephrosis to the level of the bladder without obstructing calculus. The bladder is markedly distended. There are cysts of the right kidney measuring up to 2.2 cm. Per consensus, no follow-up is needed for simple Bosniak type 1 and 2 renal cysts, unless the patient has a malignancy history or risk factors. No perinephric or periureteral stranding. GI AND BOWEL: Stomach demonstrates no acute abnormality. There is a large amount of stool throughout the colon. The appendix is not visualized. There is no bowel obstruction. PERITONEUM AND RETROPERITONEUM: No ascites. No free air. VASCULATURE: Aorta is normal in caliber. There are atherosclerotic calcifications of the aorta and iliac arteries. LYMPH NODES: No lymphadenopathy. REPRODUCTIVE ORGANS: No acute abnormality. BONES AND SOFT TISSUES: The bones are  diffusely osteopenic. There is a comminuted right femoral intertrochanteric fracture and proximal diaphyseal fracture which appears nondisplaced. No focal soft tissue abnormality. IMPRESSION: 1. Comminuted nondisplaced right femoral intertrochanteric fracture with associated proximal diaphyseal fracture. 2. Markedly distended bladder with moderate bilateral hydroureteronephrosis, likely related to urinary retention. 3. Large colonic stool burden. 4. Diffuse osteopenia. Electronically signed by: Greig Pique MD MD 07/26/2024 04:31 PM EST RP Workstation: [id number]   DG Chest Portable 1 View Result Date: 07/26/2024 CLINICAL DATA:  Trauma EXAM: PORTABLE CHEST 1 VIEW COMPARISON:  12/08/2023 FINDINGS: Post sternotomy changes. Emphysema and bronchitic changes. No acute airspace disease, pleural effusion or pneumothorax. Stable cardiomediastinal silhouette with aortic atherosclerosis. IMPRESSION: No active disease. Emphysema and bronchitic changes. Electronically Signed  By: Luke Bun M.D.   On: 07/26/2024 15:49   DG Forearm Left Result Date: 07/26/2024 CLINICAL DATA:  Mechanical fall EXAM: LEFT FOREARM - 2 VIEW COMPARISON:  None Available. FINDINGS: No fracture or malalignment.  No significant elbow effusion IMPRESSION: No acute osseous abnormality. Electronically Signed   By: Luke Bun M.D.   On: 07/26/2024 15:49   DG Pelvis Portable Result Date: 07/26/2024 CLINICAL DATA:  Hip pain post fall EXAM: PORTABLE PELVIS 1-2 VIEWS COMPARISON:  08/24/2021 FINDINGS: SI joints are non widened. Pubic symphysis and rami appear intact. Femoral heads project in joint. Mild bilateral hip degenerative changes. Acute minimally displaced right intertrochanteric proximal femur fracture. IMPRESSION: Acute minimally displaced right intertrochanteric proximal femur fracture. Electronically Signed   By: Luke Bun M.D.   On: 07/26/2024 15:48    Intake/Output      01/07 0701 01/08 0700 01/08 0701 01/09 0700   P.O. 100     Total Intake(mL/kg) 100 (1.3)    Net +100         Urine Occurrence 1 x       ROS multiple as above Blood pressure (!) 124/55, pulse (!) 105, temperature 98.7 F (37.1 C), temperature source Oral, resp. rate 18, height 6' (1.829 m), weight 74.8 kg, SpO2 95%. Physical Exam NCAT Mild tachy RLE Tender right hip  Edema/ swelling controlled  Sens: DPN, SPN, TN intact  Motor: EHL, FHL, and lessor toe ext and flex all intact grossly  DP 2+, PT 2+, Brisk cap refill, warm to touch  Gait: could not assess Coordination and balance: could not assess   Assessment/Plan:  Right intertroch subtroch fracture Multiple comorbidities: IDDM, CAD, chronic anticoagulation, limited mobility, vit D def, afib, COPD  The risks and benefits of right hip repair were discussed with the patient's wife and son, including the possibility of infection, nerve injury, vessel injury, wound breakdown, arthritis, symptomatic hardware, DVT/ PE, loss of motion, malunion, nonunion, and need for further surgery among others.  These risks were acknowledged and consent was provided to proceed.   Weightbearing: WBAT RLE Insicional and dressing care: Reinforce dressings as needed Orthopedic device(s): Walker Showering: yes, please VTE prophylaxis: Lovenox 40mg  qd four weeks Pain control: Norco Follow - up plan: 2 weeks Contact information:  Ozell Bruch, MD, [place], PA-C   Ozell Bruch, MD [facility], [facility] [phone]  07/27/2024, 7:46 AM  [facility] [address] [place] [address] [phone] (F)    After 5pm and on the weekends please log on to Amion, go to orthopaedics and the look under the [facility] Call for the provider(s) on call. You can also call our office at [phone] and then follow the prompts to be connected to the call team.      [1]  Allergies Allergen Reactions   Atorvastatin Other (See Comments)     Arthralgia, myalgia   Fluvastatin Other (See Comments)    Arthralgia, myalgias   Hymenoptera Venom Preparations    Lisinopril Cough   Metformin And Related Other (See Comments)    Anorexia, diarrhea, nausea, weight loss   Pravastatin Other (See Comments)    Arthralgia, myalgias   Simvastatin Other (See Comments)    myalgias   Spiriva Handihaler [Tiotropium Bromide] Nausea Only   "

## 2024-07-27 NOTE — Inpatient Diabetes Management (Signed)
 Inpatient Diabetes Program Recommendations  AACE/ADA: New Consensus Statement on Inpatient Glycemic Control   Target Ranges:  Prepandial:   less than 140 mg/dL      Peak postprandial:   less than 180 mg/dL (1-2 hours)      Critically ill patients:  140 - 180 mg/dL   Lab Results  Component Value Date   GLUCAP 152 (H) 07/27/2024   HGBA1C 7.7 (H) 04/26/2021    Latest Reference Range & Units 07/26/24 20:38 07/26/24 23:22 07/27/24 03:54 07/27/24 06:27 07/27/24 10:08  Glucose-Capillary 70 - 99 mg/dL 773 (H) 754 (H) 856 (H) 131 (H) 152 (H)   Review of Glycemic Control  Diabetes history: DM2  Outpatient Diabetes medications:  Trulicity  3mg  weekly  Jardiance  25mg  daily  Novolog  6 units TID Novolog  0-9 units TID  Current orders for Inpatient glycemic control: None  Inpatient Diabetes Program Recommendations:   Please consider:  - Novolog  0-9 units q4hrs  Thanks,  Lavanda Search, RN, MSN, [facility]  Inpatient Diabetes Coordinator  Pager [phone] (8a-5p)

## 2024-07-27 NOTE — Anesthesia Procedure Notes (Signed)
 Procedure Name: Intubation Date/Time: 07/27/2024 8:15 AM  Performed by: Vertie Arthea RAMAN, CRNAPre-anesthesia Checklist: Patient identified, Emergency Drugs available, Suction available and Patient being monitored Patient Re-evaluated:Patient Re-evaluated prior to induction Oxygen  Delivery Method: Circle System Utilized Preoxygenation: Pre-oxygenation with 100% oxygen  Induction Type: IV induction Ventilation: Mask ventilation without difficulty Laryngoscope Size: Mac and 4 Grade View: Grade I Tube type: Oral Tube size: 7.5 mm Number of attempts: 1 Airway Equipment and Method: Stylet and Oral airway Placement Confirmation: ETT inserted through vocal cords under direct vision, positive ETCO2 and breath sounds checked- equal and bilateral Tube secured with: Tape Dental Injury: Teeth and Oropharynx as per pre-operative assessment

## 2024-07-27 NOTE — Anesthesia Postprocedure Evaluation (Signed)
"   Anesthesia Post Note  Patient: Timothy Mclean  Procedure(s) Performed: FIXATION, FRACTURE, INTERTROCHANTERIC, WITH INTRAMEDULLARY ROD RIGHT HIP (Right)     Patient location during evaluation: PACU Anesthesia Type: General Level of consciousness: awake and alert Pain management: pain level controlled Vital Signs Assessment: post-procedure vital signs reviewed and stable Respiratory status: spontaneous breathing, nonlabored ventilation and respiratory function stable Cardiovascular status: blood pressure returned to baseline and stable Postop Assessment: no apparent nausea or vomiting Anesthetic complications: no   No notable events documented.  Last Vitals:  Vitals:   07/27/24 1049 07/27/24 1219  BP: (!) 105/56 (!) 88/51  Pulse: 95   Resp: 13 14  Temp:    SpO2: 97%     Last Pain:  Vitals:   07/27/24 1049  TempSrc:   PainSc: 0-No pain                 Tobie Hellen A.      "

## 2024-07-27 NOTE — Progress Notes (Signed)
 "        [facility]                                                                               Timothy Mclean, is a 84 y.o. male, DOB - 07/08/1941, FMW:[id number] Admit date - 07/26/2024    Outpatient Primary MD for the patient is Windy Coy, MD  LOS - 1  days    Brief summary   Timothy Mclean is a 84 y.o. male with past medical history  of dementia with history of delirium on Seroquel , asthma, COPD, chronic A-fib on Eliquis , STEMI, CAD status post CABG, most recent echo in 2022 showing EF of 60 to 65% aortic borderline dilatation at the root, CKD stage II, diabetes mellitus type 2, GERD and Barrett's esophagus, hypothyroidism, came in for a fall was found to have right intertrochanteric femoral fracture. Orthopedics consulted    Assessment & Plan    Assessment and Plan:  Right intertrochanteric femoral  fracture Orthopedics consulted and he will need cephalo medullary nail fixation (long nail given proximal diaphyseal extension) as the standard treatment for unstable intertrochanteric fractures. Pain control. Therapy eval after the surgery.    Type 2 DM CBG (last 3)  Recent Labs    07/26/24 2322 07/27/24 0354 07/27/24 0627  GLUCAP 245* 143* 131*    Sliding scale insulin  while inpatient. Hold all oral medications at this time.    Mild acute on stage IIIa CKD Baseline creatinine around 1.3 patient came in with a creatinine of 1.5. With gentle hydration creatinine has improved to 1.3.    Hyperkalemia Probably from dehydration Resolved potassium is 3.7 today.    Mild hyponatremia Resolved   History of chronic atrial fibrillation Rate controlled with Cardizem  Holding Eliquis  for the surgery.    Distended bladder with bilateral hydroureteronephrosis Foley catheter placed Recommend outpatient follow-up with urology if persistent hydroureteronephrosis    GERD/Barrett's esophagitis Continue with Protonix  twice  daily   Hypothyroidism Continue with Synthroid    History of COPD/asthma/pulmonary fibrosis Patient is on 2 L of nasal cannula oxygen  at home         RN Pressure Injury Documentation:    Malnutrition Type:  Nutrition Problem: Increased nutrient needs Etiology: hip fracture   Malnutrition Characteristics:  Signs/Symptoms: estimated needs   Nutrition Interventions:  Interventions: MVI, Glucerna shake  Estimated body mass index is 22.37 kg/m as calculated from the following:   Height as of this encounter: 6' (1.829 m).   Weight as of this encounter: 74.8 kg.  Code Status: full code.  DVT Prophylaxis:  SCDs Start: 07/26/24 1725   Level of Care: Level of care: Telemetry Family Communication: none at bedside  Disposition Plan:     Remains inpatient appropriate:  pending further work up  Procedures:    Consultants:   Orthopedics  Antimicrobials:   Anti-infectives (From admission, onward)    Start     Dose/Rate Route Frequency Ordered Stop   07/27/24 0758  ceFAZolin  (ANCEF ) 2-4 GM/100ML-% IVPB       Note to Pharmacy: [name] S: cabinet override      07/27/24 0758 07/27/24 2014        Medications  Scheduled Meds:  [MAR Hold] balsalazide  1,500 mg Oral BID   [MAR Hold] Chlorhexidine  Gluconate Cloth  6 each Topical Daily   [MAR Hold] diltiazem   120 mg Oral q AM   [START ON 07/28/2024] feeding supplement (GLUCERNA SHAKE)  237 mL Oral TID BM   [MAR Hold] finasteride   5 mg Oral Daily   [MAR Hold] fluticasone  furoate-vilanterol  1 puff Inhalation Daily   [MAR Hold] levothyroxine   75 mcg Oral Daily   multivitamin with minerals  1 tablet Oral Daily   [MAR Hold] pantoprazole  (PROTONIX ) IV  40 mg Intravenous Q12H   Continuous Infusions:  sodium chloride  50 mL/hr at 07/26/24 2224   ceFAZolin      lactated ringers  10 mL/hr at 07/27/24 0804   PRN Meds:.[MAR Hold] albuterol , [MAR Hold] bisacodyl , ceFAZolin , [MAR Hold] HYDROcodone -acetaminophen ,  insulin  aspart, [MAR Hold] methocarbamol  **OR** [MAR Hold] methocarbamol  (ROBAXIN ) injection, [MAR Hold]  morphine  injection, [MAR Hold] polyethylene glycol    Subjective:   Timothy Mclean was seen and examined today.  No new complaints.   Objective:   Vitals:   07/26/24 1956 07/27/24 0332 07/27/24 0628 07/27/24 0658  BP: (!) 132/37 97/71 (!) 116/52 (!) 124/55  Pulse: (!) 105 (!) 109 (!) 106 (!) 105  Resp: 16 15 16 18   Temp: 98.4 F (36.9 C) 98.4 F (36.9 C)  98.7 F (37.1 C)  TempSrc: Oral Oral  Oral  SpO2: 95% 97% 98% 95%  Weight:    74.8 kg  Height:    6' (1.829 m)    Intake/Output Summary (Last 24 hours) at 07/27/2024 0927 Last data filed at 07/27/2024 0916 Gross per 24 hour  Intake 400 ml  Output 250 ml  Net 150 ml   Filed Weights   07/26/24 1421 07/27/24 0658  Weight: 74.8 kg 74.8 kg     Exam General: Alert , comfortable.  Cardiovascular: S1 S2 auscultated, no murmurs, RRR Respiratory: Clear to auscultation bilaterally, no wheezing, rales or rhonchi Gastrointestinal: Soft, nontender, nondistended, + bowel sounds Zku:[id number] ROM of the right lower extremity.  Neuro: AAOx3,  Skin: No rashes Psych: Normal affect and demeanor, alert and oriented x3    Data Reviewed:  I have personally reviewed following labs and imaging studies   CBC Lab Results  Component Value Date   WBC 11.8 (H) 07/27/2024   RBC 4.69 07/27/2024   HGB 14.3 07/27/2024   HCT 42.5 07/27/2024   MCV 90.6 07/27/2024   MCH 30.5 07/27/2024   PLT 208 07/27/2024   MCHC 33.6 07/27/2024   RDW 14.2 07/27/2024   LYMPHSABS 1.7 12/08/2023   MONOABS 0.6 12/08/2023   EOSABS 0.1 12/08/2023   BASOSABS 0.0 12/08/2023     Last metabolic panel Lab Results  Component Value Date   NA 136 07/27/2024   K 3.7 07/27/2024   CL 102 07/27/2024   CO2 22 07/27/2024   BUN 24 (H) 07/27/2024   CREATININE 1.34 (H) 07/27/2024   GLUCOSE 162 (H) 07/27/2024   GFRNONAA 52 (L) 07/27/2024   GFRAA 38 (L) 08/21/2019    CALCIUM  9.3 07/27/2024   PROT 6.8 07/26/2024   ALBUMIN  3.8 07/26/2024   LABGLOB 2.2 08/21/2019   AGRATIO 2.3 (H) 08/21/2019   BILITOT 0.5 07/26/2024   ALKPHOS 122 07/26/2024   AST 45 (H) 07/26/2024   ALT 23 07/26/2024   ANIONGAP 13 07/27/2024    CBG (last 3)  Recent Labs    07/26/24 2322 07/27/24 0354 07/27/24 0627  GLUCAP 245* 143* 131*  Coagulation Profile: Recent Labs  Lab 07/26/24 1512  INR 1.3*     Radiology Studies: DG Knee Right Port Result Date: 07/26/2024 EXAM: 1 or 2 VIEW(S) XRAY OF THE RIGHT KNEE 07/26/2024 10:17:57 PM COMPARISON: 08/03/2011 CLINICAL HISTORY: [id number] Closed fracture of shaft of right femur, unspecified fracture morphology, initial encounter [facility]) [id number] FINDINGS: BONES AND JOINTS: No acute fracture. No malalignment. No significant joint effusion. Moderate to advanced tricompartmental degenerative changes. SOFT TISSUES: Diffuse vascular calcifications. IMPRESSION: 1. No acute findings. 2. Moderate to advanced tricompartmental degenerative changes. Electronically signed by: Franky Crease MD MD 07/26/2024 10:22 PM EST RP Workstation: [id number]   CT HEAD WO CONTRAST Result Date: 07/26/2024 CLINICAL DATA:  Trauma fall altered EXAM: CT HEAD WITHOUT CONTRAST CT CERVICAL SPINE WITHOUT CONTRAST TECHNIQUE: Multidetector CT imaging of the head and cervical spine was performed following the standard protocol without intravenous contrast. Multiplanar CT image reconstructions of the cervical spine were also generated. RADIATION DOSE REDUCTION: This exam was performed according to the departmental dose-optimization program which includes automated exposure control, adjustment of the mA and/or kV according to patient size and/or use of iterative reconstruction technique. COMPARISON:  CT brain 12/08/2023 FINDINGS: CT HEAD FINDINGS Brain: No acute territorial infarction, hemorrhage or intracranial mass. Atrophy and chronic small vessel ischemic changes of the white  matter. Small left chronic parietooccipital infarct. The ventricles are non enlarged. Vascular: No hyperdense vessels. Vertebral and carotid vascular calcification Skull: None no fracture Sinuses/Orbits: No acute finding. Other: None CT CERVICAL SPINE FINDINGS Alignment: Trace anterolisthesis C4 on C5, C5 on C6 and C7 on T1. Facet alignment is normal Skull base and vertebrae: No acute fracture. No primary bone lesion or focal pathologic process. Soft tissues and spinal canal: No prevertebral fluid or swelling. No visible canal hematoma. Disc levels: Multilevel degenerative changes. Advanced disc space narrowing C5-C6 and C6-C7 with moderate disc space narrowing at C7-T1. Multilevel hypertrophic facet degenerative changes with foraminal narrowing. Upper chest: Apical emphysema Other: None IMPRESSION: 1. No CT evidence for acute intracranial abnormality. Atrophy and chronic small vessel ischemic changes of the white matter. 2. Multilevel degenerative changes of the cervical spine. No acute osseous abnormality. Electronically Signed   By: Luke Bun M.D.   On: 07/26/2024 16:34   CT CERVICAL SPINE WO CONTRAST Result Date: 07/26/2024 CLINICAL DATA:  Trauma fall altered EXAM: CT HEAD WITHOUT CONTRAST CT CERVICAL SPINE WITHOUT CONTRAST TECHNIQUE: Multidetector CT imaging of the head and cervical spine was performed following the standard protocol without intravenous contrast. Multiplanar CT image reconstructions of the cervical spine were also generated. RADIATION DOSE REDUCTION: This exam was performed according to the departmental dose-optimization program which includes automated exposure control, adjustment of the mA and/or kV according to patient size and/or use of iterative reconstruction technique. COMPARISON:  CT brain 12/08/2023 FINDINGS: CT HEAD FINDINGS Brain: No acute territorial infarction, hemorrhage or intracranial mass. Atrophy and chronic small vessel ischemic changes of the white matter. Small left  chronic parietooccipital infarct. The ventricles are non enlarged. Vascular: No hyperdense vessels. Vertebral and carotid vascular calcification Skull: None no fracture Sinuses/Orbits: No acute finding. Other: None CT CERVICAL SPINE FINDINGS Alignment: Trace anterolisthesis C4 on C5, C5 on C6 and C7 on T1. Facet alignment is normal Skull base and vertebrae: No acute fracture. No primary bone lesion or focal pathologic process. Soft tissues and spinal canal: No prevertebral fluid or swelling. No visible canal hematoma. Disc levels: Multilevel degenerative changes. Advanced disc space narrowing C5-C6 and C6-C7 with moderate disc space narrowing at  C7-T1. Multilevel hypertrophic facet degenerative changes with foraminal narrowing. Upper chest: Apical emphysema Other: None IMPRESSION: 1. No CT evidence for acute intracranial abnormality. Atrophy and chronic small vessel ischemic changes of the white matter. 2. Multilevel degenerative changes of the cervical spine. No acute osseous abnormality. Electronically Signed   By: Luke Bun M.D.   On: 07/26/2024 16:34   CT ABDOMEN PELVIS W CONTRAST Result Date: 07/26/2024 EXAM: CT ABDOMEN AND PELVIS WITH CONTRAST 07/26/2024 04:11:00 PM TECHNIQUE: CT of the abdomen and pelvis was performed with the administration of 75 mL of iohexol  (OMNIPAQUE ) 350 MG/ML injection. Multiplanar reformatted images are provided for review. Automated exposure control, iterative reconstruction, and/or weight-based adjustment of the mA/kV was utilized to reduce the radiation dose to as low as reasonably achievable. COMPARISON: CT abdomen and pelvis 01/32/2018. CLINICAL HISTORY: Polytrauma, blunt. FINDINGS: LOWER CHEST: No acute abnormality. LIVER: The liver is unremarkable. GALLBLADDER AND BILE DUCTS: Gallbladder surgically absent. No biliary ductal dilatation. SPLEEN: No acute abnormality. PANCREAS: No acute abnormality. ADRENAL GLANDS: No acute abnormality. KIDNEYS, URETERS AND BLADDER: Moderate  bilateral hydroureteronephrosis to the level of the bladder without obstructing calculus. The bladder is markedly distended. There are cysts of the right kidney measuring up to 2.2 cm. Per consensus, no follow-up is needed for simple Bosniak type 1 and 2 renal cysts, unless the patient has a malignancy history or risk factors. No perinephric or periureteral stranding. GI AND BOWEL: Stomach demonstrates no acute abnormality. There is a large amount of stool throughout the colon. The appendix is not visualized. There is no bowel obstruction. PERITONEUM AND RETROPERITONEUM: No ascites. No free air. VASCULATURE: Aorta is normal in caliber. There are atherosclerotic calcifications of the aorta and iliac arteries. LYMPH NODES: No lymphadenopathy. REPRODUCTIVE ORGANS: No acute abnormality. BONES AND SOFT TISSUES: The bones are diffusely osteopenic. There is a comminuted right femoral intertrochanteric fracture and proximal diaphyseal fracture which appears nondisplaced. No focal soft tissue abnormality. IMPRESSION: 1. Comminuted nondisplaced right femoral intertrochanteric fracture with associated proximal diaphyseal fracture. 2. Markedly distended bladder with moderate bilateral hydroureteronephrosis, likely related to urinary retention. 3. Large colonic stool burden. 4. Diffuse osteopenia. Electronically signed by: Greig Pique MD MD 07/26/2024 04:31 PM EST RP Workstation: [id number]   DG Chest Portable 1 View Result Date: 07/26/2024 CLINICAL DATA:  Trauma EXAM: PORTABLE CHEST 1 VIEW COMPARISON:  12/08/2023 FINDINGS: Post sternotomy changes. Emphysema and bronchitic changes. No acute airspace disease, pleural effusion or pneumothorax. Stable cardiomediastinal silhouette with aortic atherosclerosis. IMPRESSION: No active disease. Emphysema and bronchitic changes. Electronically Signed   By: Luke Bun M.D.   On: 07/26/2024 15:49   DG Forearm Left Result Date: 07/26/2024 CLINICAL DATA:  Mechanical fall EXAM: LEFT  FOREARM - 2 VIEW COMPARISON:  None Available. FINDINGS: No fracture or malalignment.  No significant elbow effusion IMPRESSION: No acute osseous abnormality. Electronically Signed   By: Luke Bun M.D.   On: 07/26/2024 15:49   DG Pelvis Portable Result Date: 07/26/2024 CLINICAL DATA:  Hip pain post fall EXAM: PORTABLE PELVIS 1-2 VIEWS COMPARISON:  08/24/2021 FINDINGS: SI joints are non widened. Pubic symphysis and rami appear intact. Femoral heads project in joint. Mild bilateral hip degenerative changes. Acute minimally displaced right intertrochanteric proximal femur fracture. IMPRESSION: Acute minimally displaced right intertrochanteric proximal femur fracture. Electronically Signed   By: Luke Bun M.D.   On: 07/26/2024 15:48       Elgie Butter M.D. [facility] 07/27/2024, 9:27 AM  Available via Epic secure chat 7am-7pm After 7 pm, please  refer to night coverage provider listed on amion.    "

## 2024-07-27 NOTE — Transfer of Care (Signed)
 Immediate Anesthesia Transfer of Care Note  Patient: Timothy Mclean  Procedure(s) Performed: FIXATION, FRACTURE, INTERTROCHANTERIC, WITH INTRAMEDULLARY ROD RIGHT HIP (Right)  Patient Location: PACU  Anesthesia Type:General  Level of Consciousness: awake  Airway & Oxygen  Therapy: Patient Spontanous Breathing and Patient connected to face mask oxygen   Post-op Assessment: Report given to RN and Post -op Vital signs reviewed and stable  Post vital signs: Reviewed and stable  Last Vitals:  Vitals Value Taken Time  BP 123/58 07/27/24 10:07  Temp    Pulse 92 07/27/24 10:09  Resp 17 07/27/24 10:09  SpO2 99 % 07/27/24 10:09  Vitals shown include unfiled device data.  Last Pain:  Vitals:   07/27/24 0658  TempSrc: Oral         Complications: No notable events documented.

## 2024-07-27 NOTE — Progress Notes (Signed)
 LUE skin tear dressing changed. Petroleum gauze and mepilex. Site bleeding and fragile.

## 2024-07-27 NOTE — Progress Notes (Signed)
Pt off the unit to OR.  

## 2024-07-27 NOTE — Op Note (Signed)
 07/27/2024  PATIENT:  Timothy Mclean  17-Dec-1940 male   MEDICAL RECORD NUMBER: [id number]  PRE-OPERATIVE DIAGNOSIS:  RIGHT HIP SUBTROCHANTERIC FRACTURE  POST-OPERATIVE DIAGNOSIS:  RIGHT HIP SUBTROCHANTERIC FRACTURE  PROCEDURE:  INTRAMEDULLARY NAILING OF THE RIGHT HIP using a statically locked Biomet Affixus nail 11 x 420 mm.  SURGEON:  Ozell DEL. Celena, M.D.  ASSISTANT:  PA Student.  ANESTHESIA:  General.  COMPLICATIONS:  None.  ESTIMATED BLOOD LOSS:  Less than 150 mL.  DISPOSITION:  To PACU.  CONDITION:  Stable.  DELAY START OF DVT PROPHYLAXIS BECAUSE OF BLEEDING RISK: NO  BRIEF SUMMARY AND INDICATION OF PROCEDURE:  Timothy Mclean is a 84 y.o. year-old with multiple medical problems.  I discussed with the patient's wife and son the risks and benefits of surgical treatment including the potential for malunion, nonunion, symptomatic hardware, heart attack, stroke, neurovascular injury, bleeding, and others.  After acknowledging these risks, consent was provided consent to proceed.  BRIEF SUMMARY OF PROCEDURE:  The patient was taken to the operating room where general anesthesia was induced.  Patient was positioned supine on the Hana fracture table.  A closed reduction maneuver was performed of the fractured proximal femur and this was confirmed on both AP and lateral xray views. A thorough scrub and wash with chlorhexidine  and then Betadine scrub and paint was performed.  After sterile drapes and time-out, a long instrument was used to identify the appropriate starting position under C-arm on both AP and lateral images.  A 3 cm incision was made proximal to the greater trochanter.  The curved cannulated awl was inserted just medial to the tip of the lateral trochanter and then the starting guidewire advanced into the proximal femur.  This was checked on AP and lateral views.  The starting reamer was engaged with the soft tissue protected by a sleeve.  The curved ball-tipped guidewire  was then inserted, making sure it was just posterior as possible in the distal femur and across the fracture site, which stayed in a reduced position.  It was sequentially reamed up to 13 and an 11 x 420 mm nail inserted to the appropriate depth.  The guidewire for the lag screw was then inserted with appropriate anteversion to make sure it was in a center-center position. The lag screw pin was measured and the lag screw placed with excellent purchase and position checked on both views.  The set screw was then engaged within the groove of the lag screw, which was allowed to telescope. Traction was released and compression achieved with the compression device over the lag screw.  This was followed by placement of one distal locking screw using perfect circle technique.  This was confirmed on AP and lateral images. Wounds were irrigated thoroughly, closed in a standard layered fashion. Sterile gently compressive dressings were applied.  Timothy Mt, PA-C, assisted throughout.  The patient was awakened from anesthesia and transported to the PACU in stable condition.  PROGNOSIS:  The patient will be weightbearing as tolerated with physical Therapy. DVT prophylaxis will be Lovenox.  There are no range of motion precautions.  We will continue to follow while in the hospital.  Anticipate follow up in the office in 2 weeks for removal of sutures and new x-rays. Social Work has been consulted in addition with expectation that patient may have difficulty returning to the home environment with his wife as primary caretaker.     Ozell DEL. Celena, M.D.

## 2024-07-28 ENCOUNTER — Other Ambulatory Visit: Payer: Self-pay

## 2024-07-28 ENCOUNTER — Encounter (HOSPITAL_COMMUNITY): Payer: Self-pay | Admitting: Internal Medicine

## 2024-07-28 DIAGNOSIS — I959 Hypotension, unspecified: Secondary | ICD-10-CM | POA: Diagnosis not present

## 2024-07-28 DIAGNOSIS — S72001A Fracture of unspecified part of neck of right femur, initial encounter for closed fracture: Secondary | ICD-10-CM | POA: Diagnosis not present

## 2024-07-28 DIAGNOSIS — N179 Acute kidney failure, unspecified: Secondary | ICD-10-CM

## 2024-07-28 LAB — BASIC METABOLIC PANEL WITH GFR
Anion gap: 12 (ref 5–15)
BUN: 34 mg/dL — ABNORMAL HIGH (ref 8–23)
CO2: 22 mmol/L (ref 22–32)
Calcium: 8.7 mg/dL — ABNORMAL LOW (ref 8.9–10.3)
Chloride: 100 mmol/L (ref 98–111)
Creatinine, Ser: 1.61 mg/dL — ABNORMAL HIGH (ref 0.61–1.24)
GFR, Estimated: 42 mL/min — ABNORMAL LOW
Glucose, Bld: 166 mg/dL — ABNORMAL HIGH (ref 70–99)
Potassium: 4 mmol/L (ref 3.5–5.1)
Sodium: 134 mmol/L — ABNORMAL LOW (ref 135–145)

## 2024-07-28 LAB — CBC
HCT: 31.7 % — ABNORMAL LOW (ref 39.0–52.0)
Hemoglobin: 10.7 g/dL — ABNORMAL LOW (ref 13.0–17.0)
MCH: 30.1 pg (ref 26.0–34.0)
MCHC: 33.8 g/dL (ref 30.0–36.0)
MCV: 89.3 fL (ref 80.0–100.0)
Platelets: 156 K/uL (ref 150–400)
RBC: 3.55 MIL/uL — ABNORMAL LOW (ref 4.22–5.81)
RDW: 14.4 % (ref 11.5–15.5)
WBC: 12 K/uL — ABNORMAL HIGH (ref 4.0–10.5)
nRBC: 0 % (ref 0.0–0.2)

## 2024-07-28 LAB — GLUCOSE, CAPILLARY
Glucose-Capillary: 151 mg/dL — ABNORMAL HIGH (ref 70–99)
Glucose-Capillary: 234 mg/dL — ABNORMAL HIGH (ref 70–99)
Glucose-Capillary: 275 mg/dL — ABNORMAL HIGH (ref 70–99)
Glucose-Capillary: 236 mg/dL — ABNORMAL HIGH (ref 70–99)
Glucose-Capillary: 226 mg/dL — ABNORMAL HIGH (ref 70–99)

## 2024-07-28 LAB — VITAMIN D 25 HYDROXY (VIT D DEFICIENCY, FRACTURES): Vit D, 25-Hydroxy: 25 ng/mL — ABNORMAL LOW (ref 30–100)

## 2024-07-28 MED ORDER — INSULIN ASPART 100 UNIT/ML IJ SOLN
0.0000 [IU] | Freq: Three times a day (TID) | INTRAMUSCULAR | Status: DC
  Administered 2024-07-28: 5 [IU] via SUBCUTANEOUS
  Administered 2024-07-28: 8 [IU] via SUBCUTANEOUS
  Administered 2024-07-29: 11 [IU] via SUBCUTANEOUS
  Administered 2024-07-29: 8 [IU] via SUBCUTANEOUS
  Administered 2024-07-29: 3 [IU] via SUBCUTANEOUS
  Administered 2024-07-30: 8 [IU] via SUBCUTANEOUS
  Administered 2024-07-30: 3 [IU] via SUBCUTANEOUS
  Administered 2024-07-31: 15 [IU] via SUBCUTANEOUS
  Administered 2024-07-31: 8 [IU] via SUBCUTANEOUS
  Administered 2024-08-01: 3 [IU] via SUBCUTANEOUS
  Administered 2024-08-01 (×2): 8 [IU] via SUBCUTANEOUS
  Administered 2024-08-02: 5 [IU] via SUBCUTANEOUS
  Administered 2024-08-02: 3 [IU] via SUBCUTANEOUS
  Filled 2024-07-28: qty 2
  Filled 2024-07-28: qty 5
  Filled 2024-07-28 (×2): qty 3
  Filled 2024-07-28: qty 8
  Filled 2024-07-28: qty 11
  Filled 2024-07-28: qty 8
  Filled 2024-07-28: qty 3
  Filled 2024-07-28: qty 15
  Filled 2024-07-28: qty 5
  Filled 2024-07-28 (×2): qty 8
  Filled 2024-07-28: qty 3
  Filled 2024-07-28: qty 2

## 2024-07-28 MED ORDER — ROSUVASTATIN CALCIUM 20 MG PO TABS
40.0000 mg | ORAL_TABLET | Freq: Every evening | ORAL | Status: DC
  Administered 2024-07-28 – 2024-08-01 (×5): 40 mg via ORAL
  Filled 2024-07-28 (×6): qty 2

## 2024-07-28 MED ORDER — POLYETHYLENE GLYCOL 3350 17 G PO PACK
17.0000 g | PACK | Freq: Every day | ORAL | Status: DC
  Administered 2024-07-29: 17 g via ORAL
  Filled 2024-07-28 (×2): qty 1

## 2024-07-28 MED ORDER — FINASTERIDE 5 MG PO TABS
5.0000 mg | ORAL_TABLET | Freq: Every day | ORAL | Status: DC
  Administered 2024-07-29 – 2024-08-02 (×5): 5 mg via ORAL
  Filled 2024-07-28 (×5): qty 1

## 2024-07-28 MED ORDER — EZETIMIBE 10 MG PO TABS
10.0000 mg | ORAL_TABLET | Freq: Every day | ORAL | Status: DC
  Administered 2024-07-28 – 2024-08-02 (×6): 10 mg via ORAL
  Filled 2024-07-28 (×6): qty 1

## 2024-07-28 MED ORDER — MAGNESIUM CITRATE PO SOLN
1.0000 | Freq: Once | ORAL | Status: DC
  Filled 2024-07-28: qty 296

## 2024-07-28 MED ORDER — INSULIN ASPART 100 UNIT/ML IJ SOLN
0.0000 [IU] | Freq: Every day | INTRAMUSCULAR | Status: DC
  Administered 2024-07-28: 2 [IU] via SUBCUTANEOUS
  Administered 2024-07-30: 3 [IU] via SUBCUTANEOUS
  Administered 2024-07-31: 2 [IU] via SUBCUTANEOUS
  Filled 2024-07-28 (×2): qty 2
  Filled 2024-07-28: qty 3

## 2024-07-28 MED ORDER — TERBINAFINE HCL 250 MG PO TABS: 250.0000 mg | ORAL_TABLET | Freq: Every day | ORAL | Status: DC

## 2024-07-28 MED ORDER — MIDODRINE HCL 5 MG PO TABS
5.0000 mg | ORAL_TABLET | Freq: Three times a day (TID) | ORAL | Status: DC
  Administered 2024-07-28 – 2024-07-29 (×4): 5 mg via ORAL
  Filled 2024-07-28 (×4): qty 1

## 2024-07-28 MED ORDER — APIXABAN 2.5 MG PO TABS
2.5000 mg | ORAL_TABLET | Freq: Two times a day (BID) | ORAL | Status: DC
  Administered 2024-07-28 – 2024-07-30 (×5): 2.5 mg via ORAL
  Filled 2024-07-28 (×5): qty 1

## 2024-07-28 MED ORDER — SODIUM CHLORIDE 0.9 % IV SOLN: INTRAVENOUS | Status: DC

## 2024-07-28 MED ORDER — VITAMIN D 25 MCG (1000 UNIT) PO TABS
2000.0000 [IU] | ORAL_TABLET | Freq: Two times a day (BID) | ORAL | Status: DC
  Administered 2024-07-28 – 2024-08-01 (×9): 2000 [IU] via ORAL
  Filled 2024-07-28 (×9): qty 2

## 2024-07-28 MED ORDER — SENNA 8.6 MG PO TABS
2.0000 | ORAL_TABLET | Freq: Every day | ORAL | Status: DC
  Administered 2024-07-28 – 2024-08-01 (×2): 17.2 mg via ORAL
  Filled 2024-07-28 (×5): qty 2

## 2024-07-28 NOTE — Progress Notes (Signed)
 "        [facility]                                                                               Timothy Mclean, is a 84 y.o. male, DOB - Jan 08, 1941, FMW:[id number] Admit date - 07/26/2024    Outpatient Primary MD for the patient is Windy Coy, MD  LOS - 2  days    Brief summary   Timothy Mclean is a 84 y.o. male with past medical history  of dementia with history of delirium on Seroquel , asthma, COPD, chronic A-fib on Eliquis , STEMI, CAD status post CABG, most recent echo in 2022 showing EF of 60 to 65% aortic borderline dilatation at the root, CKD stage II, diabetes mellitus type 2, GERD and Barrett's esophagus, hypothyroidism, came in for a fall was found to have right intertrochanteric femoral fracture. Orthopedics consulted  Overnight patient had an episode of hypotension , which resolved with fluid bolus.     Assessment & Plan    Assessment and Plan:  Right intertrochanteric femoral  fracture Orthopedics consulted and he will need cephalo medullary nail fixation (long nail given proximal diaphyseal extension) as the standard treatment for unstable intertrochanteric fractures. Pain control. Therapy eval after the surgery.    Hypotension:  Suspect from hypovolemia from surgery.  Resolved with fluid bolus. Recommend holding the cardizem  this am.  Added midodrine  for support , which will be tapered off once BP parameters sustain in the normal range.  Monitor for    Type 2 DM CBG (last 3)  Recent Labs    07/27/24 2109 07/28/24 0614 07/28/24 0827  GLUCAP 224* 151* 234*    Sliding scale insulin  while inpatient. Hold all oral medications at this time.    Mild acute on stage IIIa CKD Baseline creatinine around 1.3 patient came in with a creatinine of 1.5. Improved to 1.3 with IV fludis and back to 1.6 this morning due to hypovolemia post op and hypotension.  Fluid boluses given and maintenance fluids ordered.  Midodrine  also added to the regimen.       Hyperkalemia Probably from dehydration Resolved potassium is 3.7 today.    Mild hyponatremia Resolved   History of chronic atrial fibrillation Rate controlled with Cardizem  Holding Eliquis  for the surgery.    Distended bladder with bilateral hydroureteronephrosis Foley catheter placed Recommend outpatient follow-up with urology if persistent hydroureteronephrosis    GERD/Barrett's esophagitis Continue with Protonix  twice daily   Hypothyroidism Continue with Synthroid    History of COPD/asthma/pulmonary fibrosis Patient is on 2 L of nasal cannula oxygen  at home         RN Pressure Injury Documentation:    Malnutrition Type:  Nutrition Problem: Increased nutrient needs Etiology: hip fracture   Malnutrition Characteristics:  Signs/Symptoms: estimated needs   Nutrition Interventions:  Interventions: MVI, Glucerna shake  Estimated body mass index is 22.37 kg/m as calculated from the following:   Height as of this encounter: 6' (1.829 m).   Weight as of this encounter: 74.8 kg.  Code Status: full code.  DVT Prophylaxis:  SCDs Start: 07/26/24 1725   Level of Care: Level of care: Telemetry Family Communication: none at bedside  Disposition Plan:     Remains inpatient appropriate:  pending further work up  Procedures:    Consultants:   Orthopedics  Antimicrobials:   Anti-infectives (From admission, onward)    Start     Dose/Rate Route Frequency Ordered Stop   07/27/24 0758  ceFAZolin  (ANCEF ) 2-4 GM/100ML-% IVPB       Note to Pharmacy: [name] S: cabinet override      07/27/24 0758 07/27/24 2014        Medications  Scheduled Meds:  balsalazide  1,500 mg Oral BID   Chlorhexidine  Gluconate Cloth  6 each Topical Daily   diltiazem   120 mg Oral q AM   feeding supplement (GLUCERNA SHAKE)  237 mL Oral TID BM   finasteride   5 mg Oral Daily   fluticasone  furoate-vilanterol  1 puff Inhalation Daily   insulin  aspart  0-9  Units Subcutaneous TID WC   levothyroxine   75 mcg Oral Daily   midodrine   5 mg Oral TID WC   multivitamin with minerals  1 tablet Oral Daily   pantoprazole   40 mg Oral BID   Continuous Infusions:  sodium chloride      lactated ringers      PRN Meds:.albuterol , bisacodyl , HYDROcodone -acetaminophen , methocarbamol  **OR** methocarbamol  (ROBAXIN ) injection, morphine  injection, polyethylene glycol    Subjective:   Timothy Mclean was seen and examined today.  Comfortable, no new complaints.   Objective:   Vitals:   07/28/24 0514 07/28/24 0643 07/28/24 0756 07/28/24 0818  BP: (!) 104/58 116/66 (!) 103/53   Pulse:   (!) 103 100  Resp:   16 18  Temp:   98.7 F (37.1 C)   TempSrc:      SpO2:   96% 96%  Weight:      Height:        Intake/Output Summary (Last 24 hours) at 07/28/2024 0839 Last data filed at 07/28/2024 0609 Gross per 24 hour  Intake 3065.67 ml  Output 1575 ml  Net 1490.67 ml   Filed Weights   07/26/24 1421 07/27/24 0658  Weight: 74.8 kg 74.8 kg     Exam General exam: Appears calm and comfortable  Respiratory system: Clear to auscultation. Respiratory effort normal. Cardiovascular system: S1 & S2 heard, RRR. No JVD, Gastrointestinal system: Abdomen is nondistended, soft and nontender. Central nervous system: Alert and oriented. Extremities: Symmetric 5 x 5 power. Skin: No rashes,  Psychiatry: Mood & affect appropriate.     Data Reviewed:  I have personally reviewed following labs and imaging studies   CBC Lab Results  Component Value Date   WBC 12.0 (H) 07/28/2024   RBC 3.55 (L) 07/28/2024   HGB 10.7 (L) 07/28/2024   HCT 31.7 (L) 07/28/2024   MCV 89.3 07/28/2024   MCH 30.1 07/28/2024   PLT 156 07/28/2024   MCHC 33.8 07/28/2024   RDW 14.4 07/28/2024   LYMPHSABS 1.7 12/08/2023   MONOABS 0.6 12/08/2023   EOSABS 0.1 12/08/2023   BASOSABS 0.0 12/08/2023     Last metabolic panel Lab Results  Component Value Date   NA 134 (L) 07/28/2024   K 4.0  07/28/2024   CL 100 07/28/2024   CO2 22 07/28/2024   BUN 34 (H) 07/28/2024   CREATININE 1.61 (H) 07/28/2024   GLUCOSE 166 (H) 07/28/2024   GFRNONAA 42 (L) 07/28/2024   GFRAA 38 (L) 08/21/2019   CALCIUM  8.7 (L) 07/28/2024   PROT 6.8 07/26/2024   ALBUMIN  3.8 07/26/2024   LABGLOB 2.2 08/21/2019   AGRATIO 2.3 (H) 08/21/2019  BILITOT 0.5 07/26/2024   ALKPHOS 122 07/26/2024   AST 45 (H) 07/26/2024   ALT 23 07/26/2024   ANIONGAP 12 07/28/2024    CBG (last 3)  Recent Labs    07/27/24 2109 07/28/24 0614 07/28/24 0827  GLUCAP 224* 151* 234*      Coagulation Profile: Recent Labs  Lab 07/26/24 1512  INR 1.3*     Radiology Studies: DG FEMUR PORT, MIN 2 VIEWS RIGHT Result Date: 07/27/2024 CLINICAL DATA:  Operative fixation of a right intertrochanteric fracture. EXAM: RIGHT FEMUR PORTABLE 2 VIEW COMPARISON:  C-arm images obtained earlier today and pelvis radiographs and CT dated 07/26/2024. FINDINGS: Intramedullary rod and screw fixation of the previously demonstrated right intertrochanteric fracture. There is valgus angulation as well as displacement of the greater and lesser trochanter fragments. Mild right hip degenerative changes. No new fractures or dislocation seen. Diffuse osteopenia. Extensive atheromatous arterial calcifications. Previously noted right knee degenerative changes. IMPRESSION: Intramedullary rod and screw fixation of the previously demonstrated right intertrochanteric fracture with valgus angulation and displacement of the greater and lesser trochanter fragments. Electronically Signed   By: Elspeth Bathe M.D.   On: 07/27/2024 14:06   DG FEMUR, MIN 2 VIEWS RIGHT Result Date: 07/27/2024 CLINICAL DATA:  Operative fixation of a right intertrochanteric fracture. EXAM: RIGHT FEMUR 2 VIEWS COMPARISON:  Right knee dated 07/26/2024. Portable pelvis radiographs dated 07/26/2024. FINDINGS: Eight C-arm images of the right femur demonstrate intramedullary rod and screw fixation of  the previously demonstrated intertrochanteric fracture. Mild residual fracture displacement. IMPRESSION: Internal fixation of the previously demonstrated right intertrochanteric fracture. Electronically Signed   By: Elspeth Bathe M.D.   On: 07/27/2024 14:03   DG C-Arm 1-60 Min-No Report Result Date: 07/27/2024 Fluoroscopy was utilized by the requesting physician.  No radiographic interpretation.   DG C-Arm 1-60 Min-No Report Result Date: 07/27/2024 Fluoroscopy was utilized by the requesting physician.  No radiographic interpretation.   DG Knee Right Port Result Date: 07/26/2024 EXAM: 1 or 2 VIEW(S) XRAY OF THE RIGHT KNEE 07/26/2024 10:17:57 PM COMPARISON: 08/03/2011 CLINICAL HISTORY: [id number] Closed fracture of shaft of right femur, unspecified fracture morphology, initial encounter [facility]) [id number] FINDINGS: BONES AND JOINTS: No acute fracture. No malalignment. No significant joint effusion. Moderate to advanced tricompartmental degenerative changes. SOFT TISSUES: Diffuse vascular calcifications. IMPRESSION: 1. No acute findings. 2. Moderate to advanced tricompartmental degenerative changes. Electronically signed by: Franky Crease MD MD 07/26/2024 10:22 PM EST RP Workstation: [id number]   CT HEAD WO CONTRAST Result Date: 07/26/2024 CLINICAL DATA:  Trauma fall altered EXAM: CT HEAD WITHOUT CONTRAST CT CERVICAL SPINE WITHOUT CONTRAST TECHNIQUE: Multidetector CT imaging of the head and cervical spine was performed following the standard protocol without intravenous contrast. Multiplanar CT image reconstructions of the cervical spine were also generated. RADIATION DOSE REDUCTION: This exam was performed according to the departmental dose-optimization program which includes automated exposure control, adjustment of the mA and/or kV according to patient size and/or use of iterative reconstruction technique. COMPARISON:  CT brain 12/08/2023 FINDINGS: CT HEAD FINDINGS Brain: No acute territorial infarction, hemorrhage or  intracranial mass. Atrophy and chronic small vessel ischemic changes of the white matter. Small left chronic parietooccipital infarct. The ventricles are non enlarged. Vascular: No hyperdense vessels. Vertebral and carotid vascular calcification Skull: None no fracture Sinuses/Orbits: No acute finding. Other: None CT CERVICAL SPINE FINDINGS Alignment: Trace anterolisthesis C4 on C5, C5 on C6 and C7 on T1. Facet alignment is normal Skull base and vertebrae: No acute fracture. No primary bone lesion or focal  pathologic process. Soft tissues and spinal canal: No prevertebral fluid or swelling. No visible canal hematoma. Disc levels: Multilevel degenerative changes. Advanced disc space narrowing C5-C6 and C6-C7 with moderate disc space narrowing at C7-T1. Multilevel hypertrophic facet degenerative changes with foraminal narrowing. Upper chest: Apical emphysema Other: None IMPRESSION: 1. No CT evidence for acute intracranial abnormality. Atrophy and chronic small vessel ischemic changes of the white matter. 2. Multilevel degenerative changes of the cervical spine. No acute osseous abnormality. Electronically Signed   By: Luke Bun M.D.   On: 07/26/2024 16:34   CT CERVICAL SPINE WO CONTRAST Result Date: 07/26/2024 CLINICAL DATA:  Trauma fall altered EXAM: CT HEAD WITHOUT CONTRAST CT CERVICAL SPINE WITHOUT CONTRAST TECHNIQUE: Multidetector CT imaging of the head and cervical spine was performed following the standard protocol without intravenous contrast. Multiplanar CT image reconstructions of the cervical spine were also generated. RADIATION DOSE REDUCTION: This exam was performed according to the departmental dose-optimization program which includes automated exposure control, adjustment of the mA and/or kV according to patient size and/or use of iterative reconstruction technique. COMPARISON:  CT brain 12/08/2023 FINDINGS: CT HEAD FINDINGS Brain: No acute territorial infarction, hemorrhage or intracranial mass.  Atrophy and chronic small vessel ischemic changes of the white matter. Small left chronic parietooccipital infarct. The ventricles are non enlarged. Vascular: No hyperdense vessels. Vertebral and carotid vascular calcification Skull: None no fracture Sinuses/Orbits: No acute finding. Other: None CT CERVICAL SPINE FINDINGS Alignment: Trace anterolisthesis C4 on C5, C5 on C6 and C7 on T1. Facet alignment is normal Skull base and vertebrae: No acute fracture. No primary bone lesion or focal pathologic process. Soft tissues and spinal canal: No prevertebral fluid or swelling. No visible canal hematoma. Disc levels: Multilevel degenerative changes. Advanced disc space narrowing C5-C6 and C6-C7 with moderate disc space narrowing at C7-T1. Multilevel hypertrophic facet degenerative changes with foraminal narrowing. Upper chest: Apical emphysema Other: None IMPRESSION: 1. No CT evidence for acute intracranial abnormality. Atrophy and chronic small vessel ischemic changes of the white matter. 2. Multilevel degenerative changes of the cervical spine. No acute osseous abnormality. Electronically Signed   By: Luke Bun M.D.   On: 07/26/2024 16:34   CT ABDOMEN PELVIS W CONTRAST Result Date: 07/26/2024 EXAM: CT ABDOMEN AND PELVIS WITH CONTRAST 07/26/2024 04:11:00 PM TECHNIQUE: CT of the abdomen and pelvis was performed with the administration of 75 mL of iohexol  (OMNIPAQUE ) 350 MG/ML injection. Multiplanar reformatted images are provided for review. Automated exposure control, iterative reconstruction, and/or weight-based adjustment of the mA/kV was utilized to reduce the radiation dose to as low as reasonably achievable. COMPARISON: CT abdomen and pelvis 01/32/2018. CLINICAL HISTORY: Polytrauma, blunt. FINDINGS: LOWER CHEST: No acute abnormality. LIVER: The liver is unremarkable. GALLBLADDER AND BILE DUCTS: Gallbladder surgically absent. No biliary ductal dilatation. SPLEEN: No acute abnormality. PANCREAS: No acute  abnormality. ADRENAL GLANDS: No acute abnormality. KIDNEYS, URETERS AND BLADDER: Moderate bilateral hydroureteronephrosis to the level of the bladder without obstructing calculus. The bladder is markedly distended. There are cysts of the right kidney measuring up to 2.2 cm. Per consensus, no follow-up is needed for simple Bosniak type 1 and 2 renal cysts, unless the patient has a malignancy history or risk factors. No perinephric or periureteral stranding. GI AND BOWEL: Stomach demonstrates no acute abnormality. There is a large amount of stool throughout the colon. The appendix is not visualized. There is no bowel obstruction. PERITONEUM AND RETROPERITONEUM: No ascites. No free air. VASCULATURE: Aorta is normal in caliber. There are atherosclerotic calcifications  of the aorta and iliac arteries. LYMPH NODES: No lymphadenopathy. REPRODUCTIVE ORGANS: No acute abnormality. BONES AND SOFT TISSUES: The bones are diffusely osteopenic. There is a comminuted right femoral intertrochanteric fracture and proximal diaphyseal fracture which appears nondisplaced. No focal soft tissue abnormality. IMPRESSION: 1. Comminuted nondisplaced right femoral intertrochanteric fracture with associated proximal diaphyseal fracture. 2. Markedly distended bladder with moderate bilateral hydroureteronephrosis, likely related to urinary retention. 3. Large colonic stool burden. 4. Diffuse osteopenia. Electronically signed by: Greig Pique MD MD 07/26/2024 04:31 PM EST RP Workstation: [id number]   DG Chest Portable 1 View Result Date: 07/26/2024 CLINICAL DATA:  Trauma EXAM: PORTABLE CHEST 1 VIEW COMPARISON:  12/08/2023 FINDINGS: Post sternotomy changes. Emphysema and bronchitic changes. No acute airspace disease, pleural effusion or pneumothorax. Stable cardiomediastinal silhouette with aortic atherosclerosis. IMPRESSION: No active disease. Emphysema and bronchitic changes. Electronically Signed   By: Luke Bun M.D.   On: 07/26/2024 15:49    DG Forearm Left Result Date: 07/26/2024 CLINICAL DATA:  Mechanical fall EXAM: LEFT FOREARM - 2 VIEW COMPARISON:  None Available. FINDINGS: No fracture or malalignment.  No significant elbow effusion IMPRESSION: No acute osseous abnormality. Electronically Signed   By: Luke Bun M.D.   On: 07/26/2024 15:49   DG Pelvis Portable Result Date: 07/26/2024 CLINICAL DATA:  Hip pain post fall EXAM: PORTABLE PELVIS 1-2 VIEWS COMPARISON:  08/24/2021 FINDINGS: SI joints are non widened. Pubic symphysis and rami appear intact. Femoral heads project in joint. Mild bilateral hip degenerative changes. Acute minimally displaced right intertrochanteric proximal femur fracture. IMPRESSION: Acute minimally displaced right intertrochanteric proximal femur fracture. Electronically Signed   By: Luke Bun M.D.   On: 07/26/2024 15:48       Elgie Butter M.D. [facility] 07/28/2024, 8:39 AM  Available via Epic secure chat 7am-7pm After 7 pm, please refer to night coverage provider listed on amion.    "

## 2024-07-28 NOTE — Progress Notes (Signed)
 "                                                                         [facility] Progress Note  Patient ID: Timothy Mclean MRN: [id number] DOB/AGE: 1941/02/04 84 y.o.  Subjective:  No acute ortho issues  R hip sore but very tolerable   A1c:8.5% 25 OH vit D: 25 ng/mL  Uses walker at baseline   ROS As above  Today's  total administered Morphine  Milligram Equivalents: 5 Yesterday's total administered Morphine  Milligram Equivalents: 55  Objective:   VITALS:   Vitals:   07/28/24 0514 07/28/24 0643 07/28/24 0756 07/28/24 0818  BP: (!) 104/58 116/66 (!) 103/53   Pulse:   (!) 103 100  Resp:   16 18  Temp:   98.7 F (37.1 C)   TempSrc:      SpO2:   96% 96%  Weight:      Height:        Estimated body mass index is 22.37 kg/m as calculated from the following:   Height as of this encounter: 6' (1.829 m).   Weight as of this encounter: 74.8 kg.   Intake/Output      01/08 0701 01/09 0700 01/09 0701 01/10 0700   P.O.     I.V. (mL/kg) 2315.7 (31)    IV Piggyback 800    Total Intake(mL/kg) 3115.7 (41.7)    Urine (mL/kg/hr) 1375 (0.8)    Blood 200    Total Output 1575    Net +1540.7           LABS  Results for orders placed or performed during the hospital encounter of 07/26/24 (from the past 24 hours)  Hemoglobin A1c     Status: Abnormal   Collection Time: 07/27/24  1:14 PM  Result Value Ref Range   Hgb A1c MFr Bld 8.5 (H) 4.8 - 5.6 %   Mean Plasma Glucose 197.25 mg/dL  Glucose, capillary     Status: Abnormal   Collection Time: 07/27/24  5:57 PM  Result Value Ref Range   Glucose-Capillary 236 (H) 70 - 99 mg/dL  Glucose, capillary     Status: Abnormal   Collection Time: 07/27/24  9:09 PM  Result Value Ref Range   Glucose-Capillary 224 (H) 70 - 99 mg/dL  CBC     Status: Abnormal   Collection Time: 07/27/24  9:21 PM  Result Value Ref Range   WBC 15.2 (H) 4.0 - 10.5 K/uL   RBC 3.55 (L) 4.22 - 5.81 MIL/uL   Hemoglobin 10.6 (L) 13.0 - 17.0  g/dL   HCT 67.7 (L) 60.9 - 47.9 %   MCV 90.7 80.0 - 100.0 fL   MCH 29.9 26.0 - 34.0 pg   MCHC 32.9 30.0 - 36.0 g/dL   RDW 85.4 88.4 - 84.4 %   Platelets 166 150 - 400 K/uL   nRBC 0.0 0.0 - 0.2 %  VITAMIN D  25 Hydroxy (Vit-D Deficiency, Fractures)     Status: Abnormal   Collection Time: 07/28/24  6:11 AM  Result Value Ref Range   Vit D, 25-Hydroxy 25.0 (L) 30 - 100 ng/mL  CBC     Status: Abnormal   Collection Time: 07/28/24  6:11 AM  Result Value Ref Range   WBC 12.0 (H) 4.0 - 10.5 K/uL   RBC 3.55 (L) 4.22 - 5.81 MIL/uL   Hemoglobin 10.7 (L) 13.0 - 17.0 g/dL   HCT 68.2 (L) 60.9 - 47.9 %   MCV 89.3 80.0 - 100.0 fL   MCH 30.1 26.0 - 34.0 pg   MCHC 33.8 30.0 - 36.0 g/dL   RDW 85.5 88.4 - 84.4 %   Platelets 156 150 - 400 K/uL   nRBC 0.0 0.0 - 0.2 %  Basic metabolic panel with GFR     Status: Abnormal   Collection Time: 07/28/24  6:11 AM  Result Value Ref Range   Sodium 134 (L) 135 - 145 mmol/L   Potassium 4.0 3.5 - 5.1 mmol/L   Chloride 100 98 - 111 mmol/L   CO2 22 22 - 32 mmol/L   Glucose, Bld 166 (H) 70 - 99 mg/dL   BUN 34 (H) 8 - 23 mg/dL   Creatinine, Ser 8.38 (H) 0.61 - 1.24 mg/dL   Calcium  8.7 (L) 8.9 - 10.3 mg/dL   GFR, Estimated 42 (L) >60 mL/min   Anion gap 12 5 - 15  Glucose, capillary     Status: Abnormal   Collection Time: 07/28/24  6:14 AM  Result Value Ref Range   Glucose-Capillary 151 (H) 70 - 99 mg/dL  Glucose, capillary     Status: Abnormal   Collection Time: 07/28/24  8:27 AM  Result Value Ref Range   Glucose-Capillary 234 (H) 70 - 99 mg/dL     PHYSICAL EXAM:   Gen: in bed, NAD, family at bedside  Ext:       Right Lower Extremity   Dressings R thigh c/d/I  Ext warm   + DP pulse  DPN, SPN, TN sensation intact   EHL, FHL, lesser toe motor intact   Ankle flexion and extension intact  Minimal swelling to R leg   No DCT, compartments are soft    Assessment/Plan: 1 Day Post-Op   Principal Problem:   Closed right hip fracture, initial  encounter (HCC) Active Problems:   Type II or unspecified type diabetes mellitus without mention of complication, not stated as uncontrolled   Anti-infectives (From admission, onward)    Start     Dose/Rate Route Frequency Ordered Stop   07/27/24 0758  ceFAZolin  (ANCEF ) 2-4 GM/100ML-% IVPB       Note to Pharmacy: [name] S: cabinet override      07/27/24 0758 07/27/24 2014     .  POD/HD#: 54  84 year old male ground-level fall with right hip fragility fracture  -Fall  - Fragility fracture right hip s/p IMN   Weight-bear as tolerated right leg with assistance  Unrestricted range of motion right hip and right knee  Therapy evaluations  Dressing changes starting tomorrow as needed  Ice as needed   TOC consult for SNF   - Pain management:  Multimodal   Minimize narcotics  - ABL anemia/Hemodynamics  Monitor  - Medical issues   Per medicine   - DVT/PE prophylaxis:  Ok to resume eliquis    - ID:   Periop abx  - Metabolic Bone Disease:  Fracture is a fragility fracture  Supplement vitamin D    - Activity:  As above  - Impediments to fracture healing:  Medical comorbid conditions   Vitamin d  insufficiency   Diabetes   Thyroid  disease   COPD   - Dispo:  Ortho issues stable  Therapy evals  TOC for SNF  Francis MICAEL Mt, PA-C [phone] (C) 07/28/2024, 10:08 AM  [facility] [address] [place] [address] [phone] (F)    After 5pm and on the weekends please log on to Amion, go to orthopaedics and the look under the [facility] Call for the provider(s) on call. You can also call our office at [phone] and then follow the prompts to be connected to the call team.  Patient ID: Timothy Mclean, male   DOB: 11/02/1940, 84 y.o.   MRN: [id number]  "

## 2024-07-28 NOTE — Evaluation (Signed)
 Occupational Therapy Evaluation Patient Details Name: Timothy Mclean MRN: [id number] DOB: May 07, 1941 Today's Date: 07/28/2024   History of Present Illness   Patient is an 84 yo male presenting to the ED status post fall on 07/26/24.  Found to have R intertrochanteric femoral fracture, R IM nail completed on 1/8. PMHx: Dementia, HTN, chronic A. fib, CAD s/p CABG 1997, IIDM, asthma/COPD, GERD/Barrett esophagus, hypothyroidism, CKD stage II.     Clinical Impressions Prior to this admission, patient living with his spouse, however son reports falls due to balance deficits and wife cannot provide assist. Patient using a RW at baseline and typically can get dressed if clothes are laid out for him, but requires assist to shower. Currently, patient with R hip pain, and need for significant assist for bed mobility and transfers (mod to max A of 2). Patient with mod A for ADL management. Patient also with low BP, with RN present and notified. OT recommending short term rehab < 3 hours prior to discharge home, OT will follow.   Orthostatic VS for the past 24 hrs (Last 3 readings):   BP- Lying BP- Sitting Pulse- Sitting BP- Standing at 0 minutes  07/28/24 1300 112/58 (!) 89/48 101 (!) 88/44        If plan is discharge home, recommend the following:   Two people to help with walking and/or transfers;A lot of help with bathing/dressing/bathroom;Assistance with cooking/housework;Direct supervision/assist for medications management;Direct supervision/assist for financial management;Help with stairs or ramp for entrance;Supervision due to cognitive status;Assist for transportation     Functional Status Assessment   Patient has had a recent decline in their functional status and demonstrates the ability to make significant improvements in function in a reasonable and predictable amount of time.     Equipment Recommendations   Other (comment) (defer to next venue)     Recommendations for Other  Services         Precautions/Restrictions   Precautions Precautions: Fall Recall of Precautions/Restrictions: Impaired Restrictions Weight Bearing Restrictions Per Provider Order: Yes RLE Weight Bearing Per Provider Order: Weight bearing as tolerated     Mobility Bed Mobility Overal bed mobility: Needs Assistance Bed Mobility: Supine to Sit     Supine to sit: +2 for physical assistance, Max assist, HOB elevated     General bed mobility comments: used bed pad to scoot hips, assist for legs off EOB and to lift trunk    Transfers Overall transfer level: Needs assistance   Transfers: Sit to/from Stand, Bed to chair/wheelchair/BSC Sit to Stand: +2 physical assistance, Max assist           General transfer comment: up to stand at [facility] with lifting help, seated on Stedy due to BP drop in standing and moved to recliner, one more stand prior to sitting then stood again for cleaning due to BM Transfer via Lift Equipment: Stedy    Balance Overall balance assessment: Needs assistance Sitting-balance support: Bilateral upper extremity supported Sitting balance-Leahy Scale: Poor Sitting balance - Comments: UE reliant in sitting   Standing balance support: Bilateral upper extremity supported Standing balance-Leahy Scale: Zero Standing balance comment: UE support and 2 A for balance and with BP drop                           ADL either performed or assessed with clinical judgement   ADL Overall ADL's : Needs assistance/impaired Eating/Feeding: Set up;Sitting   Grooming: Minimal assistance;Sitting   Upper Body  Bathing: Minimal assistance;Sitting   Lower Body Bathing: Total assistance;Maximal assistance;Sit to/from stand;Sitting/lateral leans   Upper Body Dressing : Minimal assistance;Sitting   Lower Body Dressing: Total assistance;Maximal assistance;Cueing for safety;Cueing for sequencing;Sitting/lateral leans;Sit to/from stand   Toilet Transfer: Maximal  assistance;+2 for safety/equipment;+2 for physical assistance Toilet Transfer Details (indicate cue type and reason): with stedy Toileting- Clothing Manipulation and Hygiene: Total assistance;Sit to/from stand;Sitting/lateral lean;+2 for safety/equipment;+2 for physical assistance Toileting - Clothing Manipulation Details (indicate cue type and reason): unaware of BM     Functional mobility during ADLs: Moderate assistance;+2 for physical assistance;+2 for safety/equipment;Cueing for safety;Cueing for sequencing;Rolling walker (2 wheels) General ADL Comments: Prior to this admission, patient living with his spouse, however son reports falls due to balance deficits and wife cannot provide assist. Patient using a RW at baseline and typically can get dressed if clothes are laid out for him, but requires assist to shower. Currently, patient with R hip pain, and need for significant assist for bed mobility and transfers (mod to max A of 2). Patient with mod A for ADL management. Patient also with low BP, with RN present and notified. OT recommending short term rehab < 3 hours prior to discharge home, OT will follow.     Vision Baseline Vision/History: 0 No visual deficits Ability to See in Adequate Light: 0 Adequate Patient Visual Report: No change from baseline Vision Assessment?: No apparent visual deficits     Perception Perception: Not tested       Praxis Praxis: Not tested       Pertinent Vitals/Pain Pain Assessment Pain Assessment: Faces Faces Pain Scale: Hurts even more Pain Location: R leg with movement Pain Descriptors / Indicators: Discomfort, Grimacing, Guarding, Aching Pain Intervention(s): Monitored during session, Limited activity within patient's tolerance, Premedicated before session, Repositioned     Extremity/Trunk Assessment Upper Extremity Assessment Upper Extremity Assessment: Generalized weakness;Right hand dominant   Lower Extremity Assessment Lower Extremity  Assessment: Defer to PT evaluation RLE Deficits / Details: AAROM knee/hip flexion limited by pain in supine and pt leaning away from hip in sitting due to pain, needs help to move the leg RLE Sensation: decreased light touch (reports numbness in his feet) LLE Deficits / Details: AROM WFL, strength at least 4/5 LLE Sensation: decreased light touch   Cervical / Trunk Assessment Cervical / Trunk Assessment: Kyphotic;Other exceptions Cervical / Trunk Exceptions: barrel chested   Communication Communication Communication: Impaired Factors Affecting Communication: Hearing impaired   Cognition Arousal: Alert Behavior During Therapy: WFL for tasks assessed/performed Cognition: History of cognitive impairments             OT - Cognition Comments: dementia at baseline, son present and stating patient is at his cognitive baseline                 Following commands: Impaired Following commands impaired: Follows one step commands inconsistently     Cueing  General Comments   Cueing Techniques: Verbal cues;Gestural cues  R arm with dressings over forearm with son reporting had scraped his arm falling around Thanksgiving; see flowsheet for orthostatics   Exercises     Shoulder Instructions      Home Living Family/patient expects to be discharged to:: Private residence Living Arrangements: Spouse/significant other Available Help at Discharge: Family;Available 24 hours/day Type of Home: House       Home Layout: Two level     Bathroom Shower/Tub: Chief Strategy Officer: Handicapped height     Home Equipment: Tub bench;Hand  held shower head;Rolling Vannie (2 wheels)   Additional Comments: son providing history, reports his mom can give help though not pick him up and they struggle with his weight dropping despite him eating well      Prior Functioning/Environment Prior Level of Function : Needs assist             Mobility Comments: fell three times  in 4 days over Thanksgiving so installed ramp to den no other falls till Wednesday; they have a gait belt ADLs Comments: wife helps shower and trying to get [facility] assistance    OT Problem List: Decreased strength;Decreased range of motion;Decreased activity tolerance;Impaired balance (sitting and/or standing);Decreased coordination;Decreased cognition;Decreased safety awareness;Decreased knowledge of use of DME or AE;Decreased knowledge of precautions;Pain;Increased edema   OT Treatment/Interventions: Self-care/ADL training;Therapeutic exercise;Energy conservation;DME and/or AE instruction;Manual therapy;Therapeutic activities;Patient/family education;Balance training      OT Goals(Current goals can be found in the care plan section)   Acute Rehab OT Goals Patient Stated Goal: unable OT Goal Formulation: Patient unable to participate in goal setting Time For Goal Achievement: 08/11/24 Potential to Achieve Goals: Fair ADL Goals Pt Will Perform Grooming: with set-up;sitting Pt Will Perform Lower Body Bathing: with min assist;sit to/from stand;sitting/lateral leans Pt Will Perform Lower Body Dressing: with min assist;sitting/lateral leans;sit to/from stand Pt Will Transfer to Toilet: with min assist;stand pivot transfer;bedside commode Pt Will Perform Toileting - Clothing Manipulation and hygiene: with min assist;sitting/lateral leans;sit to/from stand   OT Frequency:  Min 2X/week    Co-evaluation              AM-[name] OT 6 Clicks Daily Activity     Outcome Measure Help from another person eating meals?: A Little Help from another person taking care of personal grooming?: A Little Help from another person toileting, which includes using toliet, bedpan, or urinal?: Total Help from another person bathing (including washing, rinsing, drying)?: A Lot Help from another person to put on and taking off regular upper body clothing?: A Little Help from another person to put on and taking  off regular lower body clothing?: A Lot 6 Click Score: 14   End of Session Equipment Utilized During Treatment: Gait belt;Other (comment) Laurent) Nurse Communication: Mobility status  Activity Tolerance: Patient tolerated treatment well Patient left: in chair;with call bell/phone within reach;with chair alarm set  OT Visit Diagnosis: Unsteadiness on feet (R26.81);Other abnormalities of gait and mobility (R26.89);Repeated falls (R29.6);Muscle weakness (generalized) (M62.81);History of falling (Z91.81);Other symptoms and signs involving cognitive function;Adult, failure to thrive (R62.7);Pain Pain - Right/Left: Right Pain - part of body: Hip                Time: 1129-1201 OT Time Calculation (min): 32 min Charges:  OT General Charges $OT Visit: 1 Visit OT Evaluation $OT Eval Moderate Complexity: 1 Mod  Ronal Gift E. Charita Lindenberger, OTR/L Acute Rehabilitation Services [phone]   Ronal Gift Salt 07/28/2024, 3:14 PM

## 2024-07-28 NOTE — Evaluation (Addendum)
 Physical Therapy Evaluation Patient Details Name: Timothy Mclean MRN: [id number] DOB: Dec 22, 1940 Today's Date: 07/28/2024  History of Present Illness  Patient is an 84 yo male presenting to the ED status post fall on 07/26/24.  Found to have R intertrochanteric femoral fracture, R IM nail completed on 1/8. PMHx: Dementia, HTN, chronic A. fib, CAD s/p CABG 1997, IIDM, asthma/COPD, GERD/Barrett esophagus, hypothyroidism, CKD stage II.  Clinical Impression  Patient presents with decreased mobility due to pain generalized weakness, decreased strength/ROM R LE and noted orthostatic hypotension (which is not new per his son).  Patient was previously ambulatory with RW though had 4 falls right around Thanksgiving and family installed ramp for step down to den.  He was getting help for ADL's and family reports using gait belt for safety.  Patient today needing +2 A for mobility up to EOB then to chair with noted BP drop as below.  He denied symptoms of dizziness and RN in the room giving Midodrine .  PT will continue to follow during acute stay.  Recommend post-acute inpatient rehab (<3 hours/day) prior to d/c home.     Orthostatic VS for the past 24 hrs (Last 3 readings):  BP- Lying BP- Sitting Pulse- Sitting BP- Standing at 0 minutes  07/28/24 1300 112/58 (!) 89/48 101 (!) 88/44       If plan is discharge home, recommend the following: Two people to help with bathing/dressing/bathroom;Two people to help with walking and/or transfers   Can travel by private vehicle   No    Equipment Recommendations None recommended by PT  Recommendations for Other Services       Functional Status Assessment Patient has had a recent decline in their functional status and demonstrates the ability to make significant improvements in function in a reasonable and predictable amount of time.     Precautions / Restrictions Precautions Precautions: Fall Recall of Precautions/Restrictions: Impaired Restrictions Weight  Bearing Restrictions Per Provider Order: Yes RLE Weight Bearing Per Provider Order: Weight bearing as tolerated      Mobility  Bed Mobility Overal bed mobility: Needs Assistance Bed Mobility: Supine to Sit     Supine to sit: +2 for physical assistance, Max assist, HOB elevated     General bed mobility comments: used bed pad to scoot hips, assist for legs off EOB and to lift trunk    Transfers Overall transfer level: Needs assistance   Transfers: Sit to/from Stand, Bed to chair/wheelchair/BSC Sit to Stand: +2 physical assistance           General transfer comment: up to stand at [facility] with lifting help, seated on Stedy due to BP drop in standing and moved to recliner, one more stand prior to sitting then stood again for cleaning due to BM Transfer via Lift Equipment: Stedy  Ambulation/Gait                  Stairs            Wheelchair Mobility     Tilt Bed    Modified Rankin (Stroke Patients Only)       Balance Overall balance assessment: Needs assistance Sitting-balance support: Bilateral upper extremity supported Sitting balance-Leahy Scale: Poor Sitting balance - Comments: UE reliant in sitting   Standing balance support: Bilateral upper extremity supported Standing balance-Leahy Scale: Zero Standing balance comment: UE support and 2 A for balance and with BP drop  Pertinent Vitals/Pain Pain Assessment Pain Assessment: Faces Faces Pain Scale: Hurts even more Pain Location: R leg with movement Pain Descriptors / Indicators: Discomfort, Grimacing, Guarding, Aching Pain Intervention(s): Monitored during session, Premedicated before session, Repositioned    Home Living Family/patient expects to be discharged to:: Private residence Living Arrangements: Spouse/significant other Available Help at Discharge: Family;Available 24 hours/day Type of Home: House         Home Layout: Two level Home  Equipment: Tub bench;Hand held shower head;Rolling Walker (2 wheels) Additional Comments: son providing history, reports his mom can give help though not pick him up and they struggle with his weight dropping despite him eating well    Prior Function Prior Level of Function : Needs assist             Mobility Comments: fell three times in 4 days over Thanksgiving so installed ramp to den no other falls till Wednesday; they have a gait belt ADLs Comments: wife helps shower and trying to get [facility] assistance     Extremity/Trunk Assessment   Upper Extremity Assessment Upper Extremity Assessment: Defer to OT evaluation    Lower Extremity Assessment Lower Extremity Assessment: RLE deficits/detail;LLE deficits/detail RLE Deficits / Details: AAROM knee/hip flexion limited by pain in supine and pt leaning away from hip in sitting due to pain, needs help to move the leg RLE Sensation: decreased light touch (reports numbness in his feet) LLE Deficits / Details: AROM WFL, strength at least 4/5 LLE Sensation: decreased light touch    Cervical / Trunk Assessment Cervical / Trunk Assessment: Kyphotic;Other exceptions Cervical / Trunk Exceptions: barrel chested  Communication   Communication Communication: Impaired Factors Affecting Communication: Hearing impaired    Cognition Arousal: Alert Behavior During Therapy: WFL for tasks assessed/performed   PT - Cognitive impairments: Memory, History of cognitive impairments                       PT - Cognition Comments: STM very poort per son         Cueing       General Comments General comments (skin integrity, edema, etc.): R arm with dressings over forearm with son reporting had scraped his arm falling around Thanksgiving; see flowsheet for orthostatics    Exercises General Exercises - Lower Extremity Ankle Circles/Pumps: AROM, Both, 5 reps, Supine Heel Slides: AROM, AAROM, Right, Left, 5 reps, Supine   Assessment/Plan     PT Assessment Patient needs continued PT services  PT Problem List Decreased strength;Decreased range of motion;Decreased knowledge of use of DME;Decreased activity tolerance;Decreased balance;Decreased mobility;Cardiopulmonary status limiting activity       PT Treatment Interventions DME instruction;Gait training;Cognitive remediation;Stair training;Patient/family education;Functional mobility training;Therapeutic activities;Therapeutic exercise;Balance training;Wheelchair mobility training    PT Goals (Current goals can be found in the Care Plan section)  Acute Rehab PT Goals Patient Stated Goal: rehab prior to home per son PT Goal Formulation: With patient/family Time For Goal Achievement: 08/11/24 Potential to Achieve Goals: Fair    Frequency Min 2X/week     Co-evaluation               AM-[name] PT 6 Clicks Mobility  Outcome Measure Help needed turning from your back to your side while in a flat bed without using bedrails?: Total Help needed moving from lying on your back to sitting on the side of a flat bed without using bedrails?: Total Help needed moving to and from a bed to a chair (including a wheelchair)?: Total  Help needed standing up from a chair using your arms (e.g., wheelchair or bedside chair)?: Total Help needed to walk in hospital room?: Total Help needed climbing 3-5 steps with a railing? : Total 6 Click Score: 6    End of Session Equipment Utilized During Treatment: Gait belt Activity Tolerance: Treatment limited secondary to medical complications (Comment) (orthostatic) Patient left: in chair;with family/visitor present;with call bell/phone within reach;with chair alarm set Nurse Communication: Mobility status;Need for lift equipment PT Visit Diagnosis: History of falling (Z91.81);Difficulty in walking, not elsewhere classified (R26.2);Other symptoms and signs involving the nervous system (R29.898);Muscle weakness (generalized) (M62.81)    Time:  1131-1202 PT Time Calculation (min) (ACUTE ONLY): 31 min   Charges:   PT Evaluation $PT Eval Moderate Complexity: 1 Mod   PT General Charges $$ ACUTE PT VISIT: 1 Visit         Timothy Mclean, PT Acute Rehabilitation Services Office:[phone] 07/28/2024   Timothy Mclean 07/28/2024, 1:30 PM

## 2024-07-28 NOTE — Progress Notes (Signed)
 Nutrition - Brief Note  Consult received for assessment. RD assessment already completed on 1/8. Please see RD note for details.  Nutrition Intervention Liberalize diet to regular diet to encourage PO intake. Glucerna Shake PO TID. Each supplement provides 220 Kcals and 10 grams of protein. Multivitamin PO once daily.  Leverne Ruth, MS, RDN, LDN [place] See AMION for contact information Epic/secure chat preferred

## 2024-07-29 DIAGNOSIS — S72001D Fracture of unspecified part of neck of right femur, subsequent encounter for closed fracture with routine healing: Secondary | ICD-10-CM | POA: Diagnosis not present

## 2024-07-29 DIAGNOSIS — M25551 Pain in right hip: Secondary | ICD-10-CM | POA: Diagnosis not present

## 2024-07-29 LAB — CBC
HCT: 29.7 % — ABNORMAL LOW (ref 39.0–52.0)
Hemoglobin: 9.9 g/dL — ABNORMAL LOW (ref 13.0–17.0)
MCH: 30.3 pg (ref 26.0–34.0)
MCHC: 33.3 g/dL (ref 30.0–36.0)
MCV: 90.8 fL (ref 80.0–100.0)
Platelets: 158 K/uL (ref 150–400)
RBC: 3.27 MIL/uL — ABNORMAL LOW (ref 4.22–5.81)
RDW: 14.4 % (ref 11.5–15.5)
WBC: 12.6 K/uL — ABNORMAL HIGH (ref 4.0–10.5)
nRBC: 0 % (ref 0.0–0.2)

## 2024-07-29 LAB — BASIC METABOLIC PANEL WITH GFR
Anion gap: 11 (ref 5–15)
BUN: 39 mg/dL — ABNORMAL HIGH (ref 8–23)
CO2: 21 mmol/L — ABNORMAL LOW (ref 22–32)
Calcium: 7.8 mg/dL — ABNORMAL LOW (ref 8.9–10.3)
Chloride: 100 mmol/L (ref 98–111)
Creatinine, Ser: 1.46 mg/dL — ABNORMAL HIGH (ref 0.61–1.24)
GFR, Estimated: 47 mL/min — ABNORMAL LOW
Glucose, Bld: 167 mg/dL — ABNORMAL HIGH (ref 70–99)
Potassium: 3.9 mmol/L (ref 3.5–5.1)
Sodium: 131 mmol/L — ABNORMAL LOW (ref 135–145)

## 2024-07-29 LAB — GLUCOSE, CAPILLARY
Glucose-Capillary: 178 mg/dL — ABNORMAL HIGH (ref 70–99)
Glucose-Capillary: 279 mg/dL — ABNORMAL HIGH (ref 70–99)
Glucose-Capillary: 312 mg/dL — ABNORMAL HIGH (ref 70–99)
Glucose-Capillary: 178 mg/dL — ABNORMAL HIGH (ref 70–99)

## 2024-07-29 MED ORDER — DULAGLUTIDE 1.5 MG/0.5ML ~~LOC~~ SOAJ
1.5000 mg | SUBCUTANEOUS | Status: DC
  Administered 2024-07-30: 1.5 mg via INTRAMUSCULAR
  Filled 2024-07-29: qty 0.5

## 2024-07-29 MED ORDER — VITAMIN D (ERGOCALCIFEROL) 1.25 MG (50000 UNIT) PO CAPS
50000.0000 [IU] | ORAL_CAPSULE | ORAL | Status: DC
  Administered 2024-07-29: 50000 [IU] via ORAL
  Filled 2024-07-29: qty 1

## 2024-07-29 MED ORDER — MIDODRINE HCL 5 MG PO TABS
10.0000 mg | ORAL_TABLET | Freq: Three times a day (TID) | ORAL | Status: DC
  Administered 2024-07-29 – 2024-08-02 (×12): 10 mg via ORAL
  Filled 2024-07-29 (×13): qty 2

## 2024-07-29 NOTE — TOC Initial Note (Signed)
 Transition of Care [facility]) - Initial/Assessment Note    Patient Details  Name: Timothy Mclean MRN: [id number] Date of Birth: 02-Jan-1941  [facility]) CM/SW Contact:    Sherline Clack, LCSWA Phone Number: 07/29/2024, 12:47 PM  Clinical Narrative:                  CSW met with son, Cale, at bedside to discuss anticipated discharge plan. Son agreeable to patient going to SNF for rehab before returning home. Patient has gone to [place] in the past, and would like for CSW to send referral to facilities close to [facility]. CSW filled out FL2 and faxed out to facilities in [place], [place], [place], [place], and [place]. CSW will follow up with family once patient has bed offers. CSW will continue to follow.   Expected Discharge Plan: Skilled Nursing Facility Barriers to Discharge: SNF Pending bed offer, Insurance Authorization   Patient Goals and CMS Choice     Choice offered to / list presented to : Adult Children      Expected Discharge Plan and Services       Living arrangements for the past 2 months: Single Family Home                                      Prior Living Arrangements/Services Living arrangements for the past 2 months: Single Family Home Lives with:: Spouse                   Activities of Daily Living   ADL Screening (condition at time of admission) Independently performs ADLs?: No Does the patient have a NEW difficulty with bathing/dressing/toileting/self-feeding that is expected to last >3 days?: Yes (Initiates electronic notice to provider for possible OT consult) Does the patient have a NEW difficulty with getting in/out of bed, walking, or climbing stairs that is expected to last >3 days?: Yes (Initiates electronic notice to provider for possible PT consult) Does the patient have a NEW difficulty with communication that is expected to last >3 days?: Yes (Initiates electronic notice to provider  for possible SLP consult) Is the patient deaf or have difficulty hearing?: Yes Does the patient have difficulty seeing, even when wearing glasses/contacts?: No Does the patient have difficulty concentrating, remembering, or making decisions?: Yes  Permission Sought/Granted                  Emotional Assessment Appearance:: Appears stated age Attitude/Demeanor/Rapport: Unable to Assess Affect (typically observed): Unable to Assess Orientation: : Oriented to Self      Admission diagnosis:  Right hip pain [M25.551] Fall, initial encounter [W19.XXXA] Closed right hip fracture, initial encounter [facility]) [S72.001A] Patient Active Problem List   Diagnosis Date Noted   Closed right hip fracture, initial encounter (HCC) 07/26/2024   Malnutrition of moderate degree 04/29/2021   Community acquired pneumonia 04/26/2021   PNA (pneumonia) 04/26/2021   COVID-19 virus infection    Leg swelling 11/14/2018   Systolic ejection murmur 11/01/2017   Weakness of lower extremity 09/07/2016   Barrett's esophagus 03/09/2014   Hypotension due to drugs 03/06/2014   Emphysema of lung (HCC) 02/19/2014   Cough 02/19/2014   S/P CABG x 4    Permanent atrial fibrillation (HCC): CHA2DS2-VASc Score =4, On Eliquis . Diltiazem  for rate control.    Hyperlipidemia associated with type 2 diabetes mellitus (HCC)    History of colonic polyps 09/10/2011  UC (ulcerative colitis) (HCC) 09/10/2011   Anticoagulant long-term use 09/10/2011   VITAMIN B12 DEFICIENCY 01/30/2009   Type II or unspecified type diabetes mellitus without mention of complication, not stated as uncontrolled 10/31/2007   GOUT 10/31/2007   Essential hypertension 10/31/2007   ACID REFLUX DISEASE 10/31/2007   CAD S/P PTCA-PCI then CABG x4 12/01/1995   ST elevation myocardial infarction (STEMI) of inferior wall, subsequent episode of care (HCC) 10/30/1985   PCP:  Windy Coy, MD Pharmacy:   [facility] - [place], [place] - [address] [place] [address] Phone: [phone] Fax: [phone]  [facility] [place], [place] - [address] [place] [address] [place] [address] Phone: [phone] Fax: [phone]  [facility] - [facility] [address] [place] [address] Phone: [phone] Fax: [phone]     Social Drivers of Health (SDOH) Social History: SDOH Screenings   Food Insecurity: Patient Unable To Answer (07/28/2024)  Housing: Patient Unable To Answer (07/28/2024)  Transportation Needs: Patient Unable To Answer (07/28/2024)  Utilities: Patient Unable To Answer (07/28/2024)  Social Connections: Patient Unable To Answer (07/28/2024)  Tobacco Use: Medium Risk (07/27/2024)   SDOH Interventions:     Readmission Risk Interventions     No data to display

## 2024-07-29 NOTE — Plan of Care (Signed)

## 2024-07-29 NOTE — [place] FL2 (Signed)
 " [place]  MEDICAID FL2 LEVEL OF CARE FORM     IDENTIFICATION  Patient Name: Timothy Mclean Birthdate: [birth year] Sex: male Admission Date (Current Location): 07/26/2024  [facility] and [place] Number:  Producer, Television/film/video and Address:  [facility] [place]. [facility], [address] [place], [place] [address]      Provider Number: [id number]  Attending Physician Name and Address:  Cherlyn Labella, MD  Relative Name and Phone Number:  Cale Sweney/son: [phone]    Current Level of Care: Hospital Recommended Level of Care: Skilled Nursing Facility Prior Approval Number:    Date Approved/Denied:   PASRR Number: [id number]  Discharge Plan: SNF    Current Diagnoses: Patient Active Problem List   Diagnosis Date Noted   Closed right hip fracture, initial encounter (HCC) 07/26/2024   Malnutrition of moderate degree 04/29/2021   Community acquired pneumonia 04/26/2021   PNA (pneumonia) 04/26/2021   COVID-19 virus infection    Leg swelling 11/14/2018   Systolic ejection murmur 11/01/2017   Weakness of lower extremity 09/07/2016   Barrett's esophagus 03/09/2014   Hypotension due to drugs 03/06/2014   Emphysema of lung (HCC) 02/19/2014   Cough 02/19/2014   S/P CABG x 4    Permanent atrial fibrillation [facility]): CHA2DS2-VASc Score =4, On Eliquis . Diltiazem  for rate control.    Hyperlipidemia associated with type 2 diabetes mellitus (HCC)    History of colonic polyps 09/10/2011   UC (ulcerative colitis) (HCC) 09/10/2011   Anticoagulant long-term use 09/10/2011   VITAMIN B12 DEFICIENCY 01/30/2009   Type II or unspecified type diabetes mellitus without mention of complication, not stated as uncontrolled 10/31/2007   GOUT 10/31/2007   Essential hypertension 10/31/2007   ACID REFLUX DISEASE 10/31/2007   CAD S/P PTCA-PCI then CABG x4 12/01/1995   ST elevation myocardial infarction (STEMI) of inferior wall, subsequent episode of care (HCC) 10/30/1985    Orientation RESPIRATION  BLADDER Height & Weight     Self  Normal Incontinent, Indwelling catheter Weight: 164 lb 14.5 oz (74.8 kg) Height:  6' (182.9 cm)  BEHAVIORAL SYMPTOMS/MOOD NEUROLOGICAL BOWEL NUTRITION STATUS      Continent Diet (please refer to dc summary)  AMBULATORY STATUS COMMUNICATION OF NEEDS Skin   Limited Assist Verbally (slow deliberate speech, sometimes will not answer questions) Surgical wounds (closed, hip)                       Personal Care Assistance Level of Assistance  Bathing, Feeding, Dressing Bathing Assistance: Limited assistance Feeding assistance: Limited assistance Dressing Assistance: Limited assistance     Functional Limitations Info  Sight, Hearing, Speech Sight Info: Adequate Hearing Info: Adequate Speech Info: Adequate    SPECIAL CARE FACTORS FREQUENCY  PT (By licensed PT), OT (By licensed OT)     PT Frequency: 5x/week OT Frequency: 5x/week            Contractures Contractures Info: Not present    Additional Factors Info  Code Status, Allergies Code Status Info: Full Allergies Info: Atorvastatin, fluvastatin, hymenoptera venom preparations, linisopril, metofrmin and related, pravstatin, simvastatin, spiriva handihaler           Current Medications (07/29/2024):  This is the current hospital active medication list Current Facility-Administered Medications  Medication Dose Route Frequency Provider Last Rate Last Admin   albuterol  (PROVENTIL ) (2.5 MG/3ML) 0.083% nebulizer solution 2.5 mg  2.5 mg Inhalation Q4H PRN Deward Eck, PA-C       apixaban  (ELIQUIS ) tablet 2.5 mg  2.5  mg Oral BID Chen, Lydia D, RPH   2.5 mg at 07/29/24 1011   balsalazide (COLAZAL ) capsule 1,500 mg  1,500 mg Oral BID Akula, Vijaya, MD   1,500 mg at 07/29/24 1014   bisacodyl  (DULCOLAX) EC tablet 5 mg  5 mg Oral Daily PRN Deward Eck, PA-C       Chlorhexidine  Gluconate Cloth 2 % PADS 6 each  6 each Topical Daily Deward Eck, PA-C   6 each at 07/29/24 1014   cholecalciferol   (VITAMIN D3) 25 MCG (1000 UNIT) tablet 2,000 Units  2,000 Units Oral BID Deward Eck, PA-C   2,000 Units at 07/29/24 1012   diltiazem  (CARDIZEM  CD) 24 hr capsule 120 mg  120 mg Oral q AM Akula, Vijaya, MD   120 mg at 07/29/24 1012   ezetimibe  (ZETIA ) tablet 10 mg  10 mg Oral Daily Akula, Vijaya, MD   10 mg at 07/29/24 1012   feeding supplement (GLUCERNA SHAKE) (GLUCERNA SHAKE) liquid 237 mL  237 mL Oral TID BM Deward Eck, PA-C   237 mL at 07/28/24 2045   finasteride  (PROSCAR ) tablet 5 mg  5 mg Oral Daily Akula, Vijaya, MD   5 mg at 07/29/24 1012   fluticasone  furoate-vilanterol (BREO ELLIPTA ) 200-25 MCG/ACT 1 puff  1 puff Inhalation Daily Deward Eck, PA-C   1 puff at 07/29/24 1011   HYDROcodone -acetaminophen  (NORCO/VICODIN) 5-325 MG per tablet 1-2 tablet  1-2 tablet Oral Q6H PRN Deward Eck, PA-C   2 tablet at 07/28/24 2047   insulin  aspart (novoLOG ) injection 0-15 Units  0-15 Units Subcutaneous TID WC Akula, Vijaya, MD   8 Units at 07/29/24 1238   insulin  aspart (novoLOG ) injection 0-5 Units  0-5 Units Subcutaneous QHS Akula, Vijaya, MD   2 Units at 07/28/24 2204   levothyroxine  (SYNTHROID ) tablet 75 mcg  75 mcg Oral Daily Deward Eck, PA-C   75 mcg at 07/29/24 0555   methocarbamol  (ROBAXIN ) tablet 500 mg  500 mg Oral Q6H PRN Deward Eck, PA-C       Or   methocarbamol  (ROBAXIN ) injection 500 mg  500 mg Intravenous Q6H PRN Deward Eck, PA-C       midodrine  (PROAMATINE ) tablet 10 mg  10 mg Oral TID WC Akula, Vijaya, MD   10 mg at 07/29/24 1237   morphine  (PF) 2 MG/ML injection 2 mg  2 mg Intravenous Q2H PRN Deward Eck, PA-C       multivitamin with minerals tablet 1 tablet  1 tablet Oral Daily Deward Eck, PA-C   1 tablet at 07/29/24 1012   pantoprazole  (PROTONIX ) EC tablet 40 mg  40 mg Oral BID Akula, Vijaya, MD   40 mg at 07/29/24 1012   polyethylene glycol (MIRALAX  / GLYCOLAX ) packet 17 g  17 g Oral Daily PRN Deward Eck, PA-C       rosuvastatin  (CRESTOR ) tablet 40 mg  40 mg Oral QPM Akula,  Vijaya, MD   40 mg at 07/28/24 1749   senna (SENOKOT) tablet 17.2 mg  2 tablet Oral QHS Akula, Vijaya, MD   17.2 mg at 07/28/24 2209     Discharge Medications: Please see discharge summary for a list of discharge medications.  Relevant Imaging Results:  Relevant Lab Results:   Additional Information SSN: [id number] [place]     "

## 2024-07-29 NOTE — Progress Notes (Signed)
 "        Timothy Mclean                                                                               Timothy Mclean, is a 84 y.o. male, DOB - 1941-06-16, FMW:[id number] Admit date - 07/26/2024    Outpatient Primary MD for the patient is Windy Coy, MD  LOS - 3  days    Brief summary   Timothy Mclean is a 84 y.o. male with past medical history  of dementia with history of delirium on Seroquel , asthma, COPD, chronic A-fib on Eliquis , STEMI, CAD status post CABG, most recent echo in 2022 showing EF of 60 to 65% aortic borderline dilatation at the root, CKD stage II, diabetes mellitus type 2, GERD and Barrett's esophagus, hypothyroidism, came in for a fall was found to have right intertrochanteric femoral fracture. Orthopedics consulted  Overnight patient had an episode of hypotension , which resolved with fluid bolus.     Assessment & Plan    Assessment and Plan:  Right intertrochanteric femoral  fracture Orthopedics consulted and he will need cephalo medullary nail fixation (long nail given proximal diaphyseal extension) as the standard treatment for unstable intertrochanteric fractures. Pain control. Therapy eval after the surgery.    Hypotension:  Suspect from hypovolemia from surgery.  Resolved with fluid bolus. Recommend holding the cardizem  this am.  Added midodrine  for support , which will be tapered off once BP parameters sustain in the normal range.  Monitor for    Type 2 DM CBG (last 3)  Recent Labs    07/28/24 2125 07/29/24 0640 07/29/24 1125  GLUCAP 226* 178* 279*    Sliding scale insulin  while inpatient. Hold all oral medications at this time.    Mild acute on stage IIIa CKD Baseline creatinine around 1.3 patient came in with a creatinine of 1.5. Improved to 1.3 with IV fludis and back to 1.6 this morning due to hypovolemia post op and hypotension.  Fluid boluses given and maintenance fluids ordered.  Midodrine  also added to the regimen.       Hyperkalemia Probably from dehydration Resolved potassium is 3.7 today.    Mild hyponatremia Sodium of 131.patient denies any headache or dizziness.  Repeat sodium in am. And if persistent low, will check for SIADH.    History of chronic atrial fibrillation Rate suboptimal.  Patient on cardizem  120 mg daily, unable to increase the cardizem  due to borderline BP parameters.  Fluids given.  On eliquis  for anti coagulation.     Distended bladder with bilateral hydroureteronephrosis Foley catheter placed Recommend outpatient follow-up with urology if persistent hydroureteronephrosis Recommend repeat US  of kidneys to evaluate for resolution.    GERD/Barrett's esophagitis Continue with Protonix  twice daily   Hypothyroidism Continue with Synthroid   VITAMIN D  deficiency.  Replacements ordered.    History of COPD/asthma/pulmonary fibrosis Patient is on 2 L of nasal cannula oxygen  at home    Orthostatic hypotension When working with PT.  Started the patient on NS Fluids and added midodrine , will increase the dose of midodrine  to 5 mg TID.      RN Pressure Injury Documentation:    Malnutrition Type:  Nutrition  Problem: Increased nutrient needs Etiology: hip fracture   Malnutrition Characteristics:  Signs/Symptoms: estimated needs   Nutrition Interventions:  Interventions: MVI, Glucerna shake  Estimated body mass index is 22.37 kg/m as calculated from the following:   Height as of this encounter: 6' (1.829 m).   Weight as of this encounter: 74.8 kg.  Code Status: full code.  DVT Prophylaxis:  apixaban  (ELIQUIS ) tablet 2.5 mg Start: 07/28/24 1115 SCDs Start: 07/26/24 1725 apixaban  (ELIQUIS ) tablet 2.5 mg   Level of Care: Level of care: Telemetry Family Communication: none at bedside  Disposition Plan:     Remains inpatient appropriate:  pending further work up  Procedures:    Consultants:   Orthopedics  Antimicrobials:    Anti-infectives (From admission, onward)    Start     Dose/Rate Route Frequency Ordered Stop   07/28/24 1430  terbinafine  (LAMISIL ) tablet 250 mg  Status:  Discontinued        250 mg Oral Daily 07/28/24 1340 07/28/24 1358   07/27/24 0758  ceFAZolin  (ANCEF ) 2-4 GM/100ML-% IVPB       Note to Pharmacy: Timothy Mclean S: cabinet override      07/27/24 0758 07/27/24 2014        Medications  Scheduled Meds:  apixaban   2.5 mg Oral BID   balsalazide  1,500 mg Oral BID   Chlorhexidine  Gluconate Cloth  6 each Topical Daily   cholecalciferol   2,000 Units Oral BID   diltiazem   120 mg Oral q AM   ezetimibe   10 mg Oral Daily   feeding supplement (GLUCERNA SHAKE)  237 mL Oral TID BM   finasteride   5 mg Oral Daily   fluticasone  furoate-vilanterol  1 puff Inhalation Daily   insulin  aspart  0-15 Units Subcutaneous TID WC   insulin  aspart  0-5 Units Subcutaneous QHS   levothyroxine   75 mcg Oral Daily   midodrine   10 mg Oral TID WC   multivitamin with minerals  1 tablet Oral Daily   pantoprazole   40 mg Oral BID   rosuvastatin   40 mg Oral QPM   senna  2 tablet Oral QHS   Continuous Infusions:   PRN Meds:.albuterol , bisacodyl , HYDROcodone -acetaminophen , methocarbamol  **OR** methocarbamol  (ROBAXIN ) injection, morphine  injection, polyethylene glycol    Subjective:   Timothy Mclean was seen and examined today.  Denies any new complaints.   Objective:   Vitals:   07/29/24 0552 07/29/24 0800 07/29/24 0830 07/29/24 1127  BP: (!) 105/53 110/62  (!) 114/49  Pulse:  (!) 112 98 (!) 107  Resp: 18 20 16 18   Temp:  (!) 97.5 F (36.4 C)  99.2 F (37.3 C)  TempSrc:  Oral    SpO2:  92%  (!) 87%  Weight:      Height:        Intake/Output Summary (Last 24 hours) at 07/29/2024 1159 Last data filed at 07/29/2024 0456 Gross per 24 hour  Intake 1446.16 ml  Output 950 ml  Net 496.16 ml   Filed Weights   07/26/24 1421 07/27/24 0658  Weight: 74.8 kg 74.8 kg     Exam General exam:  Appears calm and comfortable  Respiratory system: Clear to auscultation. Respiratory effort normal. Cardiovascular system: S1 & S2 heard, irregularly irregular. SABRA No JVD,  Gastrointestinal system: Abdomen is nondistended, soft and nontender. Central nervous system: Alert and oriented. No focal neurological deficits. Extremities: Symmetric 5 x 5 power. Skin: No rashes, Psychiatry:  Mood & affect appropriate.      Data Reviewed:  I have personally reviewed following labs and imaging studies   CBC Lab Results  Component Value Date   WBC 12.6 (H) 07/29/2024   RBC 3.27 (L) 07/29/2024   HGB 9.9 (L) 07/29/2024   HCT 29.7 (L) 07/29/2024   MCV 90.8 07/29/2024   MCH 30.3 07/29/2024   PLT 158 07/29/2024   MCHC 33.3 07/29/2024   RDW 14.4 07/29/2024   LYMPHSABS 1.7 12/08/2023   MONOABS 0.6 12/08/2023   EOSABS 0.1 12/08/2023   BASOSABS 0.0 12/08/2023     Last metabolic panel Lab Results  Component Value Date   NA 131 (L) 07/29/2024   K 3.9 07/29/2024   CL 100 07/29/2024   CO2 21 (L) 07/29/2024   BUN 39 (H) 07/29/2024   CREATININE 1.46 (H) 07/29/2024   GLUCOSE 167 (H) 07/29/2024   GFRNONAA 47 (L) 07/29/2024   GFRAA 38 (L) 08/21/2019   CALCIUM  7.8 (L) 07/29/2024   PROT 6.8 07/26/2024   ALBUMIN  3.8 07/26/2024   LABGLOB 2.2 08/21/2019   AGRATIO 2.3 (H) 08/21/2019   BILITOT 0.5 07/26/2024   ALKPHOS 122 07/26/2024   AST 45 (H) 07/26/2024   ALT 23 07/26/2024   ANIONGAP 11 07/29/2024    CBG (last 3)  Recent Labs    07/28/24 2125 07/29/24 0640 07/29/24 1125  GLUCAP 226* 178* 279*      Coagulation Profile: Recent Labs  Lab 07/26/24 1512  INR 1.3*     Radiology Studies: No results found.      Elgie Butter M.D. Timothy Mclean 07/29/2024, 11:59 AM  Available via Epic secure chat 7am-7pm After 7 pm, please refer to night coverage provider listed on amion.    "

## 2024-07-29 NOTE — Progress Notes (Signed)
 "                                  Progress Note  Patient ID: Timothy Mclean MRN: [id number] DOB/AGE: [birth year] 86 y.o.  Subjective:  No acute ortho issues  R hip sore but tolerable  Son at bedside, aware of plan for SNF placement.  Uses walker at baseline   ROS As above  Today's  total administered Morphine  Milligram Equivalents: 0 Yesterday's total administered Morphine  Milligram Equivalents: 25  Objective:   VITALS:   Vitals:   07/28/24 2030 07/29/24 0500 07/29/24 0552 07/29/24 0800  BP: (!) 106/50 (!) 106/50 (!) 105/53 110/62  Pulse: 90 81  (!) 112  Resp: 16 18 18 20   Temp: 98.7 F (37.1 C) 98.6 F (37 C)  (!) 97.5 F (36.4 C)  TempSrc: Oral Oral  Oral  SpO2: 96%   92%  Weight:      Height:        Estimated body mass index is 22.37 kg/m as calculated from the following:   Height as of this encounter: 6' (1.829 m).   Weight as of this encounter: 74.8 kg.   Intake/Output      01/09 0701 01/10 0700 01/10 0701 01/11 0700   I.V. (mL/kg) 1446.2 (19.3)    IV Piggyback     Total Intake(mL/kg) 1446.2 (19.3)    Urine (mL/kg/hr) 950 (0.5)    Blood     Total Output 950    Net +496.2           LABS  Results for orders placed or performed during the hospital encounter of 07/26/24 (from the past 24 hours)  Glucose, capillary     Status: Abnormal   Collection Time: 07/28/24 11:39 AM  Result Value Ref Range   Glucose-Capillary 275 (H) 70 - 99 mg/dL  Glucose, capillary     Status: Abnormal   Collection Time: 07/28/24  4:59 PM  Result Value Ref Range   Glucose-Capillary 236 (H) 70 - 99 mg/dL  Glucose, capillary     Status: Abnormal   Collection Time: 07/28/24  9:25 PM  Result Value Ref Range   Glucose-Capillary 226 (H) 70 - 99 mg/dL  CBC     Status: Abnormal   Collection Time: 07/29/24  6:05 AM  Result Value Ref Range   WBC 12.6 (H) 4.0 - 10.5 K/uL   RBC 3.27 (L) 4.22 - 5.81 MIL/uL   Hemoglobin 9.9 (L) 13.0 - 17.0 g/dL   HCT 70.2 (L) 60.9 - 47.9 %   MCV  90.8 80.0 - 100.0 fL   MCH 30.3 26.0 - 34.0 pg   MCHC 33.3 30.0 - 36.0 g/dL   RDW 85.5 88.4 - 84.4 %   Platelets 158 150 - 400 K/uL   nRBC 0.0 0.0 - 0.2 %  Glucose, capillary     Status: Abnormal   Collection Time: 07/29/24  6:40 AM  Result Value Ref Range   Glucose-Capillary 178 (H) 70 - 99 mg/dL     PHYSICAL EXAM:   Gen: in bed, NAD, family at bedside  Ext:       Right Lower Extremity   Dressings R thigh c/d/I  Ext warm   + DP pulse  DPN, SPN, TN sensation intact   EHL, FHL, lesser toe motor intact   Ankle flexion and extension intact  Minimal swelling to R leg   No DCT, compartments are  soft    Assessment/Plan: 2 Days Post-Op   Principal Problem:   Closed right hip fracture, initial encounter (HCC) Active Problems:   Type II or unspecified type diabetes mellitus without mention of complication, not stated as uncontrolled   Anti-infectives (From admission, onward)    Start     Dose/Rate Route Frequency Ordered Stop   07/28/24 1430  terbinafine  (LAMISIL ) tablet 250 mg  Status:  Discontinued        250 mg Oral Daily 07/28/24 1340 07/28/24 1358   07/27/24 0758  ceFAZolin  (ANCEF ) 2-4 GM/100ML-% IVPB       Note to Pharmacy: [name] S: cabinet override      07/27/24 0758 07/27/24 2014     .  POD/HD#: 73  84 year old male ground-level fall with right hip fragility fracture  -Fall  - Fragility fracture right hip s/p IMN   Weight-bear as tolerated right leg with assistance  Unrestricted range of motion right hip and right knee  Therapy evaluations performed yesterday  Dressing changes starting tomorrow as needed  Ice as needed  TOC consult for SNF   - Pain management:  Multimodal   Minimize narcotics  - ABL anemia/Hemodynamics  Monitor  - Medical issues   Per medicine   - DVT/PE prophylaxis:  Ok to resume eliquis    - ID:   Periop abx  - Metabolic Bone Disease:  Fracture is a fragility fracture  Supplement vitamin D    -  Activity:  As above  - Impediments to fracture healing:  Medical comorbid conditions   Vitamin d  insufficiency   Diabetes   Thyroid  disease   COPD   - Dispo:  Ortho issues stable  TOC for SNF   Army Daring, PA-C  "

## 2024-07-30 ENCOUNTER — Inpatient Hospital Stay (HOSPITAL_COMMUNITY)

## 2024-07-30 DIAGNOSIS — S72001A Fracture of unspecified part of neck of right femur, initial encounter for closed fracture: Secondary | ICD-10-CM | POA: Diagnosis not present

## 2024-07-30 DIAGNOSIS — N179 Acute kidney failure, unspecified: Secondary | ICD-10-CM | POA: Diagnosis not present

## 2024-07-30 DIAGNOSIS — I959 Hypotension, unspecified: Secondary | ICD-10-CM | POA: Diagnosis not present

## 2024-07-30 LAB — GLUCOSE, CAPILLARY
Glucose-Capillary: 176 mg/dL — ABNORMAL HIGH (ref 70–99)
Glucose-Capillary: 201 mg/dL — ABNORMAL HIGH (ref 70–99)
Glucose-Capillary: 300 mg/dL — ABNORMAL HIGH (ref 70–99)
Glucose-Capillary: 259 mg/dL — ABNORMAL HIGH (ref 70–99)

## 2024-07-30 MED ORDER — APIXABAN 5 MG PO TABS
5.0000 mg | ORAL_TABLET | Freq: Two times a day (BID) | ORAL | Status: DC
  Administered 2024-07-30 – 2024-08-02 (×6): 5 mg via ORAL
  Filled 2024-07-30 (×6): qty 1

## 2024-07-30 MED ORDER — GUAIFENESIN-DM 100-10 MG/5ML PO SYRP: 10.0000 mL | ORAL_SOLUTION | ORAL | Status: DC | PRN

## 2024-07-30 MED ORDER — AZITHROMYCIN 250 MG PO TABS
500.0000 mg | ORAL_TABLET | Freq: Every day | ORAL | Status: DC
  Administered 2024-07-30 – 2024-08-01 (×3): 500 mg via ORAL
  Filled 2024-07-30 (×3): qty 2

## 2024-07-30 MED ORDER — SODIUM CHLORIDE 0.9 % IV SOLN
1.0000 g | INTRAVENOUS | Status: DC
  Administered 2024-07-30 – 2024-08-01 (×3): 1 g via INTRAVENOUS
  Filled 2024-07-30 (×3): qty 10

## 2024-07-30 NOTE — Progress Notes (Signed)
 "                                  Progress Note  Patient ID: Timothy Mclean MRN: [id number] DOB/AGE: 86-Dec-1942 86 y.o.  Subjective:  No acute ortho issues  R hip sore but tolerable  Family at bedside aware of plan for SNF placement.  Uses walker at baseline   ROS As above  Today's  total administered Morphine  Milligram Equivalents: 10 Yesterday's total administered Morphine  Milligram Equivalents: 10  Objective:   VITALS:   Vitals:   07/29/24 1537 07/29/24 2300 07/30/24 0520 07/30/24 0725  BP: (!) 120/50 106/60 (!) 117/53 112/67  Pulse: (!) 104 100 96 99  Resp: 20 17 14 19   Temp: 98 F (36.7 C) 97.6 F (36.4 C) 97.7 F (36.5 C) 98.2 F (36.8 C)  TempSrc: Axillary Axillary Oral Oral  SpO2: 95%  94% 94%  Weight:      Height:        Estimated body mass index is 22.37 kg/m as calculated from the following:   Height as of this encounter: 6' (1.829 m).   Weight as of this encounter: 74.8 kg.   Intake/Output      01/10 0701 01/11 0700 01/11 0701 01/12 0700   P.O. 300    I.V. (mL/kg)     Total Intake(mL/kg) 300 (4)    Urine (mL/kg/hr) 1350 (0.8)    Total Output 1350    Net -1050           LABS  Results for orders placed or performed during the hospital encounter of 07/26/24 (from the past 24 hours)  Glucose, capillary     Status: Abnormal   Collection Time: 07/29/24 11:25 AM  Result Value Ref Range   Glucose-Capillary 279 (H) 70 - 99 mg/dL  Glucose, capillary     Status: Abnormal   Collection Time: 07/29/24  3:52 PM  Result Value Ref Range   Glucose-Capillary 312 (H) 70 - 99 mg/dL  Glucose, capillary     Status: Abnormal   Collection Time: 07/29/24 10:03 PM  Result Value Ref Range   Glucose-Capillary 178 (H) 70 - 99 mg/dL  Glucose, capillary     Status: Abnormal   Collection Time: 07/30/24  5:41 AM  Result Value Ref Range   Glucose-Capillary 176 (H) 70 - 99 mg/dL     PHYSICAL EXAM:   Gen: in bed, NAD, family at bedside  Ext:       Right Lower  Extremity   Dressings R thigh c/d/I  Ext warm   + DP pulse  DPN, SPN, TN sensation intact   EHL, FHL, lesser toe motor intact   Ankle flexion and extension intact  Minimal swelling to R leg   compartments are soft    Assessment/Plan: 3 Days Post-Op   Principal Problem:   Closed right hip fracture, initial encounter (HCC) Active Problems:   Type II or unspecified type diabetes mellitus without mention of complication, not stated as uncontrolled   Anti-infectives (From admission, onward)    Start     Dose/Rate Route Frequency Ordered Stop   07/28/24 1430  terbinafine  (LAMISIL ) tablet 250 mg  Status:  Discontinued        250 mg Oral Daily 07/28/24 1340 07/28/24 1358   07/27/24 0758  ceFAZolin  (ANCEF ) 2-4 GM/100ML-% IVPB       Note to Pharmacy: [name] S: cabinet override  07/27/24 0758 07/27/24 2014     .  POD/HD#: 36  84 year old male ground-level fall with right hip fragility fracture  -Fall  - Fragility fracture right hip s/p IMN   Weight-bear as tolerated right leg with assistance  Unrestricted range of motion right hip and right knee  Therapy evaluations performed  Dressing changes as needed  Ice as needed  TOC consult for SNF   - Pain management:  Multimodal   Minimize narcotics  - ABL anemia/Hemodynamics  Monitor  - Medical issues   Per medicine   - DVT/PE prophylaxis:  Ok to resume eliquis    - ID:   Periop abx  - Metabolic Bone Disease:  Fracture is a fragility fracture  Supplement vitamin D    - Activity:  As above  - Impediments to fracture healing:  Medical comorbid conditions   Vitamin d  insufficiency   Diabetes   Thyroid  disease   COPD   - Dispo:  Ortho issues stable  Ok to discharge to SNF when bed available  Army Daring, PA-C  "

## 2024-07-30 NOTE — Progress Notes (Signed)
 "        [facility]                                                                               Nitesh Pitstick, is a 84 y.o. male, DOB - Sep 19, 1940, FMW:[id number] Admit date - 07/26/2024    Outpatient Primary MD for the patient is Windy Coy, MD  LOS - 4  days    Brief summary   Timothy Mclean is a 84 y.o. male with past medical history  of dementia with history of delirium on Seroquel , asthma, COPD, chronic A-fib on Eliquis , STEMI, CAD status post CABG, most recent echo in 2022 showing EF of 60 to 65% aortic borderline dilatation at the root, CKD stage II, diabetes mellitus type 2, GERD and Barrett's esophagus, hypothyroidism, came in for a fall was found to have right intertrochanteric femoral fracture. Orthopedics consulted  Overnight patient had an episode of hypotension , which resolved with fluid bolus.     Assessment & Plan    Assessment and Plan:  Right intertrochanteric femoral  fracture Orthopedics consulted and he will need cephalo medullary nail fixation (long nail given proximal diaphyseal extension) as the standard treatment for unstable intertrochanteric fractures. Pain control. Therapy eval after the surgery.    Hypotension:  Suspect from hypovolemia from surgery.  Resolved with fluid bolus. Recommend holding the cardizem  this am.  Added midodrine  for support , which will be tapered off once BP parameters sustain in the normal range.  Monitor for episodes orthostatic hypotension.  Repeat orthostatics in am with PT.    Type 2 DM CBG (last 3)  Recent Labs    07/30/24 0541 07/30/24 1128 07/30/24 1559  GLUCAP 176* 201* 300*    Sliding scale insulin  while inpatient. Hold all oral medications at this time.    Mild acute on stage IIIa CKD Baseline creatinine around 1.3 patient came in with a creatinine of 1.5. Improved to 1.3 with IV fludis and back to 1.6 this morning due to hypovolemia post op and hypotension.  Fluid boluses given and  maintenance fluids ordered.  Midodrine  also added to the regimen.    Cough this morning. CXR showing pneumonia on the right.  Started him on IV ceftriaxone   and azithromycin .  Added robitussin.  Suspect aspiration. Check SLP eval in am.   Hyperkalemia Probably from dehydration Resolved .     Mild hyponatremia Sodium of 131.patient denies any headache or dizziness.  Repeat sodium in am. And if persistent low, will check for SIADH.    History of chronic atrial fibrillation Rate suboptimal.  Patient on cardizem  120 mg daily, unable to increase the cardizem  due to borderline BP parameters.  Fluids given.  On eliquis  for anti coagulation.     Distended bladder with bilateral hydroureteronephrosis Foley catheter placed Recommend outpatient follow-up with urology if persistent hydroureteronephrosis Recommend repeat US  of kidneys to evaluate for resolution.    GERD/Barrett's esophagitis Continue with Protonix  twice daily   Hypothyroidism Continue with Synthroid   VITAMIN D  deficiency.  Replacements ordered.    History of COPD/asthma/pulmonary fibrosis Patient is on 2 L of nasal cannula oxygen  at home    Orthostatic hypotension When working with  PT.  Started the patient on NS Fluids and added midodrine , will increase the dose of midodrine  to 5 mg TID.      RN Pressure Injury Documentation:    Malnutrition Type:  Nutrition Problem: Increased nutrient needs Etiology: hip fracture   Malnutrition Characteristics:  Signs/Symptoms: estimated needs   Nutrition Interventions:  Interventions: MVI, Glucerna shake  Estimated body mass index is 22.37 kg/m as calculated from the following:   Height as of this encounter: 6' (1.829 m).   Weight as of this encounter: 74.8 kg.  Code Status: full code.  DVT Prophylaxis:  SCDs Start: 07/26/24 1725 apixaban  (ELIQUIS ) tablet 5 mg   Level of Care: Level of care: Telemetry Family Communication: none at  bedside  Disposition Plan:     Remains inpatient appropriate:  pending further work up  Procedures:    Consultants:   Orthopedics  Antimicrobials:   Anti-infectives (From admission, onward)    Start     Dose/Rate Route Frequency Ordered Stop   07/28/24 1430  terbinafine  (LAMISIL ) tablet 250 mg  Status:  Discontinued        250 mg Oral Daily 07/28/24 1340 07/28/24 1358   07/27/24 0758  ceFAZolin  (ANCEF ) 2-4 GM/100ML-% IVPB       Note to Pharmacy: [name] S: cabinet override      07/27/24 0758 07/27/24 2014        Medications  Scheduled Meds:  apixaban   5 mg Oral BID   balsalazide  1,500 mg Oral BID   Chlorhexidine  Gluconate Cloth  6 each Topical Daily   cholecalciferol   2,000 Units Oral BID   diltiazem   120 mg Oral q AM   Dulaglutide   1.5 mg Injection Weekly   ezetimibe   10 mg Oral Daily   feeding supplement (GLUCERNA SHAKE)  237 mL Oral TID BM   finasteride   5 mg Oral Daily   fluticasone  furoate-vilanterol  1 puff Inhalation Daily   insulin  aspart  0-15 Units Subcutaneous TID WC   insulin  aspart  0-5 Units Subcutaneous QHS   levothyroxine   75 mcg Oral Daily   midodrine   10 mg Oral TID WC   multivitamin with minerals  1 tablet Oral Daily   pantoprazole   40 mg Oral BID   rosuvastatin   40 mg Oral QPM   senna  2 tablet Oral QHS   Vitamin D  (Ergocalciferol )  50,000 Units Oral Q7 days   Continuous Infusions:   PRN Meds:.albuterol , bisacodyl , guaiFENesin -dextromethorphan , HYDROcodone -acetaminophen , methocarbamol  **OR** methocarbamol  (ROBAXIN ) injection, morphine  injection, polyethylene glycol    Subjective:   Timothy Mclean was seen and examined today.  Coughing since this morning.   Objective:   Vitals:   07/29/24 2300 07/30/24 0520 07/30/24 0725 07/30/24 1515  BP: 106/60 (!) 117/53 112/67 (!) 114/57  Pulse: 100 96 99 93  Resp: 17 14 19 18   Temp: 97.6 F (36.4 C) 97.7 F (36.5 C) 98.2 F (36.8 C) 99.3 F (37.4 C)  TempSrc: Axillary Oral Oral  Oral  SpO2:  94% 94% 90%  Weight:      Height:        Intake/Output Summary (Last 24 hours) at 07/30/2024 1845 Last data filed at 07/30/2024 1100 Gross per 24 hour  Intake 300 ml  Output 2100 ml  Net -1800 ml   Filed Weights   07/26/24 1421 07/27/24 0658  Weight: 74.8 kg 74.8 kg     Exam General exam: elderly gentleman in mild distress from cough.  Respiratory system: Clear to auscultation. Respiratory effort  normal. Cardiovascular system: S1 & S2 heard, RRR.  Gastrointestinal system: Abdomen is nondistended, soft and nontender.  Central nervous system: Alert and oriented.  Extremities: Symmetric 5 x 5 power. Skin: No rashes, Psychiatry: mood is appropriate.      Data Reviewed:  I have personally reviewed following labs and imaging studies   CBC Lab Results  Component Value Date   WBC 12.6 (H) 07/29/2024   RBC 3.27 (L) 07/29/2024   HGB 9.9 (L) 07/29/2024   HCT 29.7 (L) 07/29/2024   MCV 90.8 07/29/2024   MCH 30.3 07/29/2024   PLT 158 07/29/2024   MCHC 33.3 07/29/2024   RDW 14.4 07/29/2024   LYMPHSABS 1.7 12/08/2023   MONOABS 0.6 12/08/2023   EOSABS 0.1 12/08/2023   BASOSABS 0.0 12/08/2023     Last metabolic panel Lab Results  Component Value Date   NA 131 (L) 07/29/2024   K 3.9 07/29/2024   CL 100 07/29/2024   CO2 21 (L) 07/29/2024   BUN 39 (H) 07/29/2024   CREATININE 1.46 (H) 07/29/2024   GLUCOSE 167 (H) 07/29/2024   GFRNONAA 47 (L) 07/29/2024   GFRAA 38 (L) 08/21/2019   CALCIUM  7.8 (L) 07/29/2024   PROT 6.8 07/26/2024   ALBUMIN  3.8 07/26/2024   LABGLOB 2.2 08/21/2019   AGRATIO 2.3 (H) 08/21/2019   BILITOT 0.5 07/26/2024   ALKPHOS 122 07/26/2024   AST 45 (H) 07/26/2024   ALT 23 07/26/2024   ANIONGAP 11 07/29/2024    CBG (last 3)  Recent Labs    07/30/24 0541 07/30/24 1128 07/30/24 1559  GLUCAP 176* 201* 300*      Coagulation Profile: Recent Labs  Lab 07/26/24 1512  INR 1.3*     Radiology Studies: DG CHEST PORT 1  VIEW Result Date: 07/30/2024 CLINICAL DATA:  Cough. EXAM: PORTABLE CHEST 1 VIEW COMPARISON:  07/26/2024, 01/16/2014. FINDINGS: The heart size and mediastinal contours are within normal limits. There is atherosclerotic calcification of the aorta. Emphysematous changes and interstitial prominence is present bilaterally. Airspace disease is noted in the mid to lower lung field on the right. Mild atelectasis is noted at the left lung base. There is a small right pleural effusion. No pneumothorax is seen. No acute osseous abnormality. IMPRESSION: 1. Airspace disease in the mid to lower lung field on the right, suspicious for pneumonia. 2. Small right pleural effusion. 3. Emphysematous changes. Electronically Signed   By: Leita Birmingham M.D.   On: 07/30/2024 18:20        Elgie Butter M.D. [facility] 07/30/2024, 6:45 PM  Available via Epic secure chat 7am-7pm After 7 pm, please refer to night coverage provider listed on amion.    "

## 2024-07-30 NOTE — Plan of Care (Signed)
  Problem: Education: Goal: Knowledge of General Education information will improve Description: Including pain rating scale, medication(s)/side effects and non-pharmacologic comfort measures Outcome: Progressing   Problem: Health Behavior/Discharge Planning: Goal: Ability to manage health-related needs will improve Outcome: Progressing   Problem: Activity: Goal: Risk for activity intolerance will decrease Outcome: Progressing   Problem: Nutrition: Goal: Adequate nutrition will be maintained Outcome: Progressing   Problem: Coping: Goal: Level of anxiety will decrease Outcome: Progressing   Problem: Pain Managment: Goal: General experience of comfort will improve and/or be controlled Outcome: Progressing   Problem: Safety: Goal: Ability to remain free from injury will improve Outcome: Progressing

## 2024-07-31 ENCOUNTER — Inpatient Hospital Stay (HOSPITAL_COMMUNITY)

## 2024-07-31 LAB — CBC WITH DIFFERENTIAL/PLATELET
Abs Immature Granulocytes: 0.11 K/uL — ABNORMAL HIGH (ref 0.00–0.07)
Basophils Absolute: 0 K/uL (ref 0.0–0.1)
Basophils Relative: 0 %
Eosinophils Absolute: 0 K/uL (ref 0.0–0.5)
Eosinophils Relative: 0 %
HCT: 31.6 % — ABNORMAL LOW (ref 39.0–52.0)
Hemoglobin: 10.7 g/dL — ABNORMAL LOW (ref 13.0–17.0)
Immature Granulocytes: 1 %
Lymphocytes Relative: 5 %
Lymphs Abs: 0.8 K/uL (ref 0.7–4.0)
MCH: 30.1 pg (ref 26.0–34.0)
MCHC: 33.9 g/dL (ref 30.0–36.0)
MCV: 89 fL (ref 80.0–100.0)
Monocytes Absolute: 1.1 K/uL — ABNORMAL HIGH (ref 0.1–1.0)
Monocytes Relative: 7 %
Neutro Abs: 14.1 K/uL — ABNORMAL HIGH (ref 1.7–7.7)
Neutrophils Relative %: 87 %
Platelets: 231 K/uL (ref 150–400)
RBC: 3.55 MIL/uL — ABNORMAL LOW (ref 4.22–5.81)
RDW: 14.5 % (ref 11.5–15.5)
WBC: 16.1 K/uL — ABNORMAL HIGH (ref 4.0–10.5)
nRBC: 0 % (ref 0.0–0.2)

## 2024-07-31 LAB — BASIC METABOLIC PANEL WITH GFR
Anion gap: 11 (ref 5–15)
BUN: 37 mg/dL — ABNORMAL HIGH (ref 8–23)
CO2: 24 mmol/L (ref 22–32)
Calcium: 8.6 mg/dL — ABNORMAL LOW (ref 8.9–10.3)
Chloride: 98 mmol/L (ref 98–111)
Creatinine, Ser: 1.31 mg/dL — ABNORMAL HIGH (ref 0.61–1.24)
GFR, Estimated: 53 mL/min — ABNORMAL LOW
Glucose, Bld: 196 mg/dL — ABNORMAL HIGH (ref 70–99)
Potassium: 4 mmol/L (ref 3.5–5.1)
Sodium: 132 mmol/L — ABNORMAL LOW (ref 135–145)

## 2024-07-31 LAB — GLUCOSE, CAPILLARY
Glucose-Capillary: 190 mg/dL — ABNORMAL HIGH (ref 70–99)
Glucose-Capillary: 376 mg/dL — ABNORMAL HIGH (ref 70–99)
Glucose-Capillary: 256 mg/dL — ABNORMAL HIGH (ref 70–99)
Glucose-Capillary: 249 mg/dL — ABNORMAL HIGH (ref 70–99)

## 2024-07-31 MED ORDER — FOOD THICKENER (SIMPLYTHICK HONEY): 1.0000 | ORAL | Status: DC | PRN

## 2024-07-31 NOTE — TOC Progression Note (Signed)
 Transition of Care [facility]) - Progression Note    Patient Details  Name: Timothy Mclean MRN: [id number] Date of Birth: 31-Dec-1940  [facility] CM/SW Contact  Bridget Cordella Simmonds, LCSW Phone Number: 07/31/2024, 10:52 AM  Clinical Narrative:   Pt oriented x1, bed offers on medicare choice document provided to pt wife Elveria.  She will accept offer at [facility].  SNF auth with ptar requested with Tammy/[facility].     Expected Discharge Plan: Skilled Nursing Facility Barriers to Discharge: SNF Pending bed offer, Insurance Authorization               Expected Discharge Plan and Services       Living arrangements for the past 2 months: Single Family Home                                       Social Drivers of Health (SDOH) Interventions SDOH Screenings   Food Insecurity: Patient Unable To Answer (07/28/2024)  Housing: Patient Unable To Answer (07/28/2024)  Transportation Needs: Patient Unable To Answer (07/28/2024)  Utilities: Patient Unable To Answer (07/28/2024)  Social Connections: Patient Unable To Answer (07/28/2024)  Tobacco Use: Medium Risk (07/27/2024)    Readmission Risk Interventions     No data to display

## 2024-07-31 NOTE — Progress Notes (Signed)
 "        [facility]                                                                               Calixto Pavel, is a 84 y.o. male, DOB - [birth year], FMW:[id number] Admit date - 07/26/2024    Outpatient Primary MD for the patient is Windy Coy, MD  LOS - 5  days    Brief summary   OAKLYN MANS is a 84 y.o. male with past medical history  of dementia with history of delirium on Seroquel , asthma, COPD, chronic A-fib on Eliquis , STEMI, CAD status post CABG, most recent echo in 2022 showing EF of 60 to 65% aortic borderline dilatation at the root, CKD stage II, diabetes mellitus type 2, GERD and Barrett's esophagus, hypothyroidism, came in for a fall was found to have right intertrochanteric femoral fracture. Orthopedics consulted  Overnight patient had an episode of hypotension , which resolved with fluid bolus.     Assessment & Plan    Assessment and Plan:  Right intertrochanteric femoral  fracture Orthopedics consulted and he underwent cephalo medullary nail fixation (long nail given proximal diaphyseal extension) as the standard treatment for unstable intertrochanteric fractures. Pain control. Therapy eval after the surgery.    Hypotension:  Suspect from hypovolemia from surgery.  Resolved with fluid bolus.  Added midodrine  for support , which will be tapered off once BP parameters sustain in the normal range.  Monitor for episodes orthostatic hypotension.  Repeat orthostatics in am with PT.    Type 2 DM CBG (last 3)  Recent Labs    07/30/24 2158 07/31/24 0637 07/31/24 1143  GLUCAP 259* 190* 376*    Sliding scale insulin  while inpatient. Hold all oral medications at this time.    Mild acute on stage IIIa CKD Baseline creatinine around 1.3 patient came in with a creatinine of 1.5. Improved to 1.3 with IV fludis and back to 1.6 this morning due to hypovolemia post op and hypotension.  Fluid boluses given and maintenance fluids ordered.  Midodrine  also  added to the regimen.    Persistent cough ,  CXR showing pneumonia on the right.  Started him on IV ceftriaxone   and azithromycin .  Added robitussin.  Suspect aspiration. SLP eval recommended MBS , which showed reduced airway protection and efficiency. He is further impacted by posture. SLP recommended dysphagia 2 diet with honey thick liquids.   Hyperkalemia Probably from dehydration Resolved .     Mild hyponatremia SODIUM OF 132.    History of chronic atrial fibrillation Rate between 90 tp 110/min.   Patient on cardizem  120 mg daily, unable to increase the cardizem  due to borderline BP parameters.  Fluids given.  On eliquis  for anti coagulation.     Distended bladder with bilateral hydroureteronephrosis Foley catheter placed Recommend outpatient follow-up with urology if persistent hydroureteronephrosis Recommend repeat US  of kidneys in 1 to 2 weeks to evaluate for resolution.    GERD/Barrett's esophagitis Continue with Protonix  twice daily   Hypothyroidism Continue with Synthroid   VITAMIN D  deficiency.  Replacements ordered.    History of COPD/asthma/pulmonary fibrosis Patient is on 2 L of nasal cannula oxygen  at home  Orthostatic hypotension When working with PT.  Started the patient on NS Fluids and added midodrine , will increase the dose of midodrine  to 10 mg TID.      RN Pressure Injury Documentation:    Malnutrition Type:  Nutrition Problem: Increased nutrient needs Etiology: hip fracture   Malnutrition Characteristics:  Signs/Symptoms: estimated needs   Nutrition Interventions:  Interventions: MVI, Glucerna shake  Estimated body mass index is 22.37 kg/m as calculated from the following:   Height as of this encounter: 6' (1.829 m).   Weight as of this encounter: 74.8 kg.  Code Status: full code.  DVT Prophylaxis:  SCDs Start: 07/26/24 1725 apixaban  (ELIQUIS ) tablet 5 mg   Level of Care: Level of care: Telemetry Family  Communication: none at bedside  Disposition Plan:     Remains inpatient appropriate:  pending.   Procedures:    Consultants:   Orthopedics  Antimicrobials:   Anti-infectives (From admission, onward)    Start     Dose/Rate Route Frequency Ordered Stop   07/30/24 2000  cefTRIAXone  (ROCEPHIN ) 1 g in sodium chloride  0.9 % 100 mL IVPB        1 g 200 mL/hr over 30 Minutes Intravenous Every 24 hours 07/30/24 1851     07/30/24 2000  azithromycin  (ZITHROMAX ) tablet 500 mg        500 mg Oral Daily at bedtime 07/30/24 1851 08/04/24 2159   07/28/24 1430  terbinafine  (LAMISIL ) tablet 250 mg  Status:  Discontinued        250 mg Oral Daily 07/28/24 1340 07/28/24 1358   07/27/24 0758  ceFAZolin  (ANCEF ) 2-4 GM/100ML-% IVPB       Note to Pharmacy: [name] S: cabinet override      07/27/24 0758 07/27/24 2014        Medications  Scheduled Meds:  apixaban   5 mg Oral BID   azithromycin   500 mg Oral QHS   balsalazide  1,500 mg Oral BID   Chlorhexidine  Gluconate Cloth  6 each Topical Daily   cholecalciferol   2,000 Units Oral BID   diltiazem   120 mg Oral q AM   Dulaglutide   1.5 mg Injection Weekly   ezetimibe   10 mg Oral Daily   feeding supplement (GLUCERNA SHAKE)  237 mL Oral TID BM   finasteride   5 mg Oral Daily   fluticasone  furoate-vilanterol  1 puff Inhalation Daily   insulin  aspart  0-15 Units Subcutaneous TID WC   insulin  aspart  0-5 Units Subcutaneous QHS   levothyroxine   75 mcg Oral Daily   midodrine   10 mg Oral TID WC   multivitamin with minerals  1 tablet Oral Daily   pantoprazole   40 mg Oral BID   rosuvastatin   40 mg Oral QPM   senna  2 tablet Oral QHS   Vitamin D  (Ergocalciferol )  50,000 Units Oral Q7 days   Continuous Infusions:  cefTRIAXone  (ROCEPHIN )  IV Stopped (07/31/24 0213)    PRN Meds:.albuterol , bisacodyl , guaiFENesin -dextromethorphan , HYDROcodone -acetaminophen , methocarbamol  **OR** methocarbamol  (ROBAXIN ) injection, morphine  injection, polyethylene  glycol    Subjective:   Ranulfo Kall was seen and examined today.  No new complaints.   Objective:   Vitals:   07/30/24 2049 07/31/24 0100 07/31/24 0641 07/31/24 1404  BP: 129/83 119/68 (!) 122/51 110/64  Pulse: (!) 108 (!) 102 98 96  Resp: 18   16  Temp: 99.1 F (37.3 C) (!) 97.2 F (36.2 C) 98.7 F (37.1 C) (!) 97.5 F (36.4 C)  TempSrc: Axillary Oral  Oral  SpO2: 92% 95% 94% 97%  Weight:      Height:        Intake/Output Summary (Last 24 hours) at 07/31/2024 1438 Last data filed at 07/31/2024 1100 Gross per 24 hour  Intake 349.49 ml  Output 2000 ml  Net -1650.51 ml   Filed Weights   07/26/24 1421 07/27/24 0658  Weight: 74.8 kg 74.8 kg     Exam General exam: Appears calm and comfortable  Respiratory system: Clear to auscultation. Respiratory effort normal. Cardiovascular system: S1 & S2 heard, RRR.  Gastrointestinal system: Abdomen is nondistended, soft and nontender.  Central nervous system: Alert and oriented.  Extremities: Symmetric 5 x 5 power. Skin: No rashes, Psychiatry: Mood & affect appropriate.      Data Reviewed:  I have personally reviewed following labs and imaging studies   CBC Lab Results  Component Value Date   WBC 16.1 (H) 07/31/2024   RBC 3.55 (L) 07/31/2024   HGB 10.7 (L) 07/31/2024   HCT 31.6 (L) 07/31/2024   MCV 89.0 07/31/2024   MCH 30.1 07/31/2024   PLT 231 07/31/2024   MCHC 33.9 07/31/2024   RDW 14.5 07/31/2024   LYMPHSABS 0.8 07/31/2024   MONOABS 1.1 (H) 07/31/2024   EOSABS 0.0 07/31/2024   BASOSABS 0.0 07/31/2024     Last metabolic panel Lab Results  Component Value Date   NA 132 (L) 07/31/2024   K 4.0 07/31/2024   CL 98 07/31/2024   CO2 24 07/31/2024   BUN 37 (H) 07/31/2024   CREATININE 1.31 (H) 07/31/2024   GLUCOSE 196 (H) 07/31/2024   GFRNONAA 53 (L) 07/31/2024   GFRAA 38 (L) 08/21/2019   CALCIUM  8.6 (L) 07/31/2024   PROT 6.8 07/26/2024   ALBUMIN  3.8 07/26/2024   LABGLOB 2.2 08/21/2019   AGRATIO 2.3  (H) 08/21/2019   BILITOT 0.5 07/26/2024   ALKPHOS 122 07/26/2024   AST 45 (H) 07/26/2024   ALT 23 07/26/2024   ANIONGAP 11 07/31/2024    CBG (last 3)  Recent Labs    07/30/24 2158 07/31/24 0637 07/31/24 1143  GLUCAP 259* 190* 376*      Coagulation Profile: Recent Labs  Lab 07/26/24 1512  INR 1.3*     Radiology Studies: DG CHEST PORT 1 VIEW Result Date: 07/30/2024 CLINICAL DATA:  Cough. EXAM: PORTABLE CHEST 1 VIEW COMPARISON:  07/26/2024, 01/16/2014. FINDINGS: The heart size and mediastinal contours are within normal limits. There is atherosclerotic calcification of the aorta. Emphysematous changes and interstitial prominence is present bilaterally. Airspace disease is noted in the mid to lower lung field on the right. Mild atelectasis is noted at the left lung base. There is a small right pleural effusion. No pneumothorax is seen. No acute osseous abnormality. IMPRESSION: 1. Airspace disease in the mid to lower lung field on the right, suspicious for pneumonia. 2. Small right pleural effusion. 3. Emphysematous changes. Electronically Signed   By: Leita Birmingham M.D.   On: 07/30/2024 18:20        Elgie Butter M.D. [facility] 07/31/2024, 2:38 PM  Available via Epic secure chat 7am-7pm After 7 pm, please refer to night coverage provider listed on amion.    "

## 2024-07-31 NOTE — Progress Notes (Signed)
 Physical Therapy Treatment Patient Details Name: Timothy Mclean MRN: [id number] DOB: Jul 06, 1941 Today's Date: 07/31/2024   History of Present Illness Patient is an 84 yo male presenting to the ED status post fall on 07/26/24.  Found to have R intertrochanteric femoral fracture, R IM nail completed on 1/8. PMHx: Dementia, HTN, chronic A. fib, CAD s/p CABG 1997, IIDM, asthma/COPD, GERD/Barrett esophagus, hypothyroidism, CKD stage II.    PT Comments  Pt tolerates treatment well, participating in multiple transfer attempts and bed mobility. Pt remains weak and has difficulty consistently initiating mobility of BLE. Pt requires significant physical assistance for all functional mobility tasks. Patient will benefit from continued inpatient follow up therapy, <3 hours/day.    If plan is discharge home, recommend the following: Two people to help with bathing/dressing/bathroom;Two people to help with walking and/or transfers   Can travel by private vehicle     No  Equipment Recommendations  Hospital bed;Hoyer lift    Recommendations for Other Services       Precautions / Restrictions Precautions Precautions: Fall Recall of Precautions/Restrictions: Impaired Restrictions Weight Bearing Restrictions Per Provider Order: Yes RLE Weight Bearing Per Provider Order: Weight bearing as tolerated     Mobility  Bed Mobility Overal bed mobility: Needs Assistance Bed Mobility: Rolling, Supine to Sit, Sit to Supine Rolling: Max assist, Used rails   Supine to sit: Max assist, HOB elevated, Used rails Sit to supine: Total assist        Transfers Overall transfer level: Needs assistance Equipment used: Rolling walker (2 wheels), 1 person hand held assist Transfers: Sit to/from Stand Sit to Stand: Max assist, From elevated surface           General transfer comment: pt stands once with BUE support of RW, then stands a 2nd time with face to face assist from PT. Improved posture with 2nd  attempt    Ambulation/Gait                   Stairs             Wheelchair Mobility     Tilt Bed    Modified Rankin (Stroke Patients Only)       Balance Overall balance assessment: Needs assistance Sitting-balance support: No upper extremity supported, Feet supported Sitting balance-Leahy Scale: Fair     Standing balance support: Bilateral upper extremity supported, Reliant on assistive device for balance Standing balance-Leahy Scale: Zero                              Communication Communication Communication: Impaired Factors Affecting Communication: Hearing impaired  Cognition Arousal: Alert Behavior During Therapy: WFL for tasks assessed/performed   PT - Cognitive impairments: Memory, History of cognitive impairments                       PT - Cognition Comments: STM very poor per son, slowed processing also hindered by [facility] Following commands: Impaired Following commands impaired: Follows one step commands with increased time, Follows multi-step commands inconsistently    Cueing Cueing Techniques: Verbal cues, Tactile cues, Visual cues  Exercises      General Comments General comments (skin integrity, edema, etc.): VSS on RA, pt incontinent of stool during or prior to mobilizing. NT assists PT in cleaning pt and changing sheets      Pertinent Vitals/Pain Pain Assessment Pain Assessment: Faces Faces Pain Scale: Hurts even more Pain Location: RLE  with movement Pain Descriptors / Indicators: Aching Pain Intervention(s): Monitored during session    Home Living                          Prior Function            PT Goals (current goals can now be found in the care plan section) Acute Rehab PT Goals Patient Stated Goal: rehab prior to home per son Progress towards PT goals: Progressing toward goals    Frequency    Min 2X/week      PT Plan      Co-evaluation              AM-[name] PT 6  Clicks Mobility   Outcome Measure  Help needed turning from your back to your side while in a flat bed without using bedrails?: A Lot Help needed moving from lying on your back to sitting on the side of a flat bed without using bedrails?: A Lot Help needed moving to and from a bed to a chair (including a wheelchair)?: Total Help needed standing up from a chair using your arms (e.g., wheelchair or bedside chair)?: Total Help needed to walk in hospital room?: Total Help needed climbing 3-5 steps with a railing? : Total 6 Click Score: 8    End of Session Equipment Utilized During Treatment: Gait belt Activity Tolerance: Patient tolerated treatment well Patient left: in bed;with call bell/phone within reach;with bed alarm set Nurse Communication: Mobility status;Need for lift equipment PT Visit Diagnosis: History of falling (Z91.81);Difficulty in walking, not elsewhere classified (R26.2);Other symptoms and signs involving the nervous system (R29.898);Muscle weakness (generalized) (M62.81)     Time: 8285-8259 PT Time Calculation (min) (ACUTE ONLY): 26 min  Charges:    $Therapeutic Activity: 23-37 mins PT General Charges $$ ACUTE PT VISIT: 1 Visit                     Bernardino JINNY Ruth, PT, DPT Acute Rehabilitation Office [phone]    Bernardino JINNY Ruth 07/31/2024, 5:57 PM

## 2024-07-31 NOTE — Evaluation (Signed)
 Clinical/Bedside Swallow Evaluation Patient Details  Name: Timothy Mclean MRN: [id number] Date of Birth: 26-Aug-1940  Today's Date: 07/31/2024 Time: SLP Start Time (ACUTE ONLY): 9073 SLP Stop Time (ACUTE ONLY): 0946 SLP Time Calculation (min) (ACUTE ONLY): 20 min  Past Medical History:  Past Medical History:  Diagnosis Date   Allergy    Anxiety    Asthma    since 2013   Barrett's esophagus 03/09/2014   Bilateral lumbar radiculopathy    since 2013   CAD S/P percutaneous coronary angioplasty 1987   a. 1987 angioplasty and BMS- Cx; b. 1997 CABG x 4;  c. MYOVIEW  1/'05: ? Ischemia vs. artifact --> sent for cath - patent grafts.   Chronic atrial fibrillation (HCC)    a. Prev on amiodarone -->d/c 08/2016 in setting of recurrent AF, LFT abnormalities, and pulmonary symptoms;  b. CHA2DS2VASc = 4-->eliquis .   Chronic bronchitis (HCC)    since 2007   COPD (chronic obstructive pulmonary disease) (HCC)    DDD (degenerative disc disease)    Diverticulosis    Dyslipidemia, goal LDL below 70    Doing better off of Zocor, currently on Lescol    ED (erectile dysfunction)    Elevated LFTs    Esophageal reflux    GERD (gastroesophageal reflux disease)    Gout, unspecified    Hearing loss    Helicobacter pylori gastritis 07/2009   Herpes zoster 2009   Hypothyroidism    since 2010   Iron deficiency anemia, unspecified    Leg weakness    a. 09/2016 ABI's: R 1.05, L 0.98.   Other and unspecified hyperlipidemia    Other B-complex deficiencies    Personal history of colonic polyps    Prostatitis    chronic ulcerative   Pulmonary fibrosis (HCC)    and bronchiectasis, since 2015   Renal stone    Right   S/P CABG x 4 1997   a) LIMA-LAD, SVG to OM, SVG-dRCA-RPL; b) FALSE + MYOVIEW  --> CATH 1/'05: 100% LAD after widelly patent D1, all Cx OM branches 100%, pRCA 100%, SVG-PDA patent w/ retrograde filling of RPL, SVG-OM widely patent ~ normal OM, LIMA-LAD patent.   Spinal stenosis    ST elevation  myocardial infarction (STEMI) of inferior wall, subsequent episode of care (HCC) 1987, 1997   a) PTCA of Cx; b) CABG    Type II or unspecified type diabetes mellitus without mention of complication, not stated as uncontrolled    no meds   Ulcerative colitis, unspecified    Unspecified essential hypertension    Vitamin D  deficiency    Past Surgical History:  Past Surgical History:  Procedure Laterality Date   BLEPHAROPLASTY Bilateral    for ptosis   CARDIAC CATHETERIZATION  08/17/2003   False positive stress test  grafts patent; RCA proximal 100% LAD 100% occlusion after normal D1 with 80% ostial as SP1; circumflex patent but OM1 on to all occluded; EF 50-55%   CARDIAC CATHETERIZATION  11/1995   Preop CABG: LAD 90% at D1, circumflex 100 and OM a 90% proximal, 80% distal   Carotid Duplex Doppler  12/23/2009   Right&Left ICAs 0-49%, mildly abnormal study,    CHOLECYSTECTOMY     COLONOSCOPY     CORONARY ARTERY BYPASS GRAFT  11/1995   LIMA-LAD, SVG to OM, SVG-dRCA-RPL   ESOPHAGOGASTRODUODENOSCOPY     FLEXIBLE SIGMOIDOSCOPY     INTRAMEDULLARY (IM) NAIL INTERTROCHANTERIC Right 07/27/2024   Procedure: FIXATION, FRACTURE, INTERTROCHANTERIC, WITH INTRAMEDULLARY ROD RIGHT HIP;  Surgeon: Celena Sharper,  MD;  Location: [facility] OR;  Service: Orthopedics;  Laterality: Right;   NM MYOCAR PERF EJECTION FRACTION  07/03/2003   FALSE POSITIVE TEST; Bruce protocol, negative test with scintigraphic evidence of inferoapical scar, diaphragmatic attenuation, moderate ischemia.   NM MYOVIEW  LTD  2005   Questionable ischemia that is likely either infarct versus artifact; normal   TRANSTHORACIC ECHOCARDIOGRAM  12/2017   EF 55-60%.  Unable to assess diastolic function due to A. fib.  Moderate LA/RA dilation.  Mild MR.   HPI:  Patient is an 84 yo male presenting to the ED status post fall on 07/26/24.  Found to have R intertrochanteric femoral fracture, R IM nail completed on 1/8. SLP ordered 1/12 for swallow eval due  to concern for PNA (R mid to lower lung field per CXR). Pt had a previous MBS in 2022 after having COVID with pharyngeal residue as well as penetration and aspiration (PAS 7, 8) with thin and nectar thick liquids. Dys 3 diet/honey thick liquids by cup recommended. PMH includes: dementia, Barrett's esophagus, COPD, GERD, hearing loss, CAD s/p CABG, HTN, chronic A. fib, DMII, asthma, hypothyroidism, CKD stage II    Assessment / Plan / Recommendation  Clinical Impression  Pt presents with a h/o dysphagia during period of acute deconditioning as well as esophageal history. Given inconsistent s/s concerning for aspiration, as well as concern for PNA, recommend proceeding with MBS as can be scheduled. Would encourage aspiration and esophageal precautions pending completion, tentatively scheduled for later today.   Pt's wife says that he typically does not have difficulty swallowing at home, but does note that he has a baseline cough. She notices it a lot at night. Immediate coughing was noted after initial sips of thin liquids by cup and by straw, with no further coughing observed with thin liquids. There are not a lot of overt signs of dysphagia noted clinically, although dentures fit loosely and he ends up having mastication that is prolonged. Oral clearance is complete.   Note that pt's wife reports that his cognition and communication are at his baseline given h/o dementia. Speech/language evaluation therefore deferred at this time.   SLP Visit Diagnosis: Dysphagia, unspecified (R13.10)      Swallow Evaluation Recommendations Recommendations: PO diet PO Diet Recommendation: Regular;Thin liquids (Level 0) Liquid Administration via: Cup;Straw Medication Administration: Whole meds with puree Supervision: Patient able to self-feed;Full supervision/cueing for swallowing strategies Swallowing strategies  : Minimize environmental distractions;Slow rate;Small bites/sips Postural changes: Position pt fully  upright for meals;Stay upright 30-60 min after meals Oral care recommendations: Oral care BID (2x/day)   Assistance Recommended at Discharge    Functional Status Assessment    Frequency and Duration            Prognosis Barriers to Reach Goals: Cognitive deficits      Swallow Study   General HPI: Patient is an 84 yo male presenting to the ED status post fall on 07/26/24.  Found to have R intertrochanteric femoral fracture, R IM nail completed on 1/8. SLP ordered 1/12 for swallow eval due to concern for PNA (R mid to lower lung field per CXR). Pt had a previous MBS in 2022 after having COVID with pharyngeal residue as well as penetration and aspiration (PAS 7, 8) with thin and nectar thick liquids. Dys 3 diet/honey thick liquids by cup recommended. PMH includes: dementia, Barrett's esophagus, COPD, GERD, hearing loss, CAD s/p CABG, HTN, chronic A. fib, DMII, asthma, hypothyroidism, CKD stage II Type of Study: Bedside Swallow Evaluation  Previous Swallow Assessment: see HPI Diet Prior to this Study: Regular;Thin liquids (Level 0) Temperature Spikes Noted: No Respiratory Status: Room air History of Recent Intubation: Yes Total duration of intubation (days):  (for procedure) Date extubated: 07/27/24 Behavior/Cognition:  (HOH, does not have hearing aid here) Oral Cavity Assessment: Within Functional Limits Oral Care Completed by SLP: No Oral Cavity - Dentition: Dentures, top;Dentures, bottom (loose) Vision: Functional for self-feeding Self-Feeding Abilities: Able to feed self Patient Positioning: Upright in bed Baseline Vocal Quality: Normal Volitional Cough: Strong Volitional Swallow: Able to elicit    Oral/Motor/Sensory Function Overall Oral Motor/Sensory Function:  (difficulty following commands but apepars to be functional as can be assessed)   Ice Chips Ice chips: Not tested   Thin Liquid Thin Liquid: Impaired Presentation: Self Fed;Cup;Straw Pharyngeal  Phase Impairments: Cough -  Immediate    Nectar Thick Nectar Thick Liquid: Not tested   Honey Thick Honey Thick Liquid: Not tested   Puree Puree: Within functional limits Presentation: Spoon   Solid     Solid: Impaired Presentation: Self Fed Pharyngeal Phase Impairments: Cough - Delayed      Leita SAILOR., M.A. CCC-SLP Acute Rehabilitation Services Office: [phone]  Secure chat preferred  07/31/2024,9:53 AM

## 2024-07-31 NOTE — Inpatient Diabetes Management (Addendum)
 Inpatient Diabetes Program Recommendations  AACE/ADA: New Consensus Statement on Inpatient Glycemic Control (2015)  Target Ranges:  Prepandial:   less than 140 mg/dL      Peak postprandial:   less than 180 mg/dL (1-2 hours)      Critically ill patients:  140 - 180 mg/dL   Lab Results  Component Value Date   GLUCAP 190 (H) 07/31/2024   HGBA1C 8.5 (H) 07/27/2024    Latest Reference Range & Units 07/30/24 05:41 07/30/24 11:28 07/30/24 15:59 07/30/24 21:58 07/31/24 06:37  Glucose-Capillary 70 - 99 mg/dL 823 (H) 798 (H) 699 (H) 259 (H) 190 (H)    Review of Glycemic Control  Diabetes history: DM2  Outpatient Diabetes medications:  Trulicity  3mg  weekly  Jardiacne 25mg  daily  Novolog  6 units TID with meals  Novolog  0-9 units TID   Current orders for Inpatient glycemic control: Dulaglutide  1.5mg  weekly   Novolog  0-15 units TID + 0-5 units at bedtime   Inpatient Diabetes Program Recommendations:   Please consider adding Novolog  3 units TID with meals (hold if patient consumes <50% or NPO)   Thanks,  Lavanda Search, RN, MSN, [facility]  Inpatient Diabetes Coordinator  Pager [phone] (8a-5p)

## 2024-07-31 NOTE — Evaluation (Signed)
 Modified Barium Swallow Study  Patient Details  Name: Timothy Mclean MRN: [id number] Date of Birth: 1940/11/10  Today's Date: 07/31/2024  Modified Barium Swallow completed.  Full report located under Chart Review in the Imaging Section.  History of Present Illness Patient is an 84 yo male presenting to the ED status post fall on 07/26/24.  Found to have R intertrochanteric femoral fracture, R IM nail completed on 1/8. SLP ordered 1/12 for swallow eval due to concern for PNA (R mid to lower lung field per CXR). Pt had a previous MBS in 2022 after having COVID with pharyngeal residue as well as penetration and aspiration (PAS 7, 8) with thin and nectar thick liquids. Dys 3 diet/honey thick liquids by cup recommended. PMH includes: dementia, Barrett's esophagus, COPD, GERD, hearing loss, CAD s/p CABG, HTN, chronic A. fib, DMII, asthma, hypothyroidism, CKD stage II   Clinical Impression Pt presents with an oropharyngeal dysphagia as well as suspected underlying esophageal component. He exhibits reduced airway protection (silent aspiration) and efficiency. He is further impacted by posture and benefits from a slightly reclined posture, so that his neck and pharynx are in a more vertical position for intake. With this positioning, still recommend Dys 2 (finely chopped) diet and honey thick liquids.  Pt has oral deficits that include a lot of repetitive lingual motion to attempt posterior transit, leaving collections of residue diffusely throughout his oral cavity. No overt anterior loss was noted though, and mastication seems to be fairly appropriate.   Pharyngeally he has reduced base of tongue retraction, pharyngeal squeeze, and hyolaryngeal movement. His epiglottis has very little movement backwards, and no downward inversion. He has incomplete airway protection but also incomplete pharyngeal clearance, leaving collections of residue diffusely in his pharynx as well. When also factoring in his kyphotic  posture, with pharynx almost completely horizontal, his positioning further allows for barium to enter his airway during the swallow and after. Aspiration is observed with thin and nectar thick liquids, but also suspected with initial sips of honey thick liquids (PAS 8, silent aspiration). This is difficult to differentiate given presence of persistent barium from thinner consistencies, but once he could cough and clear his laryngeal vestibule further, there were no other instances of aspiration with honey thick liquids or solids.   Pt did his relative best when positioned slightly reclined, so that his pharynx was in a more vertical position. Even in this position there is trace penetration of honey thick liquids and purees, but a cued cough is effective at clearing his laryngeal vestibule. Note that his esophagus had retained barium throughout the distal portion upon brief esophageal sweep.     Factors that may increase risk of adverse event in presence of aspiration Noe & Lianne 2021): Respiratory or GI disease;Reduced cognitive function;Limited mobility;Frail or deconditioned;Dependence for feeding and/or oral hygiene;Aspiration of thick, dense, and/or acidic materials  Swallow Evaluation Recommendations Recommendations: PO diet PO Diet Recommendation: Dysphagia 2 (Finely chopped);Moderately thick liquids (Level 3, honey thick) Liquid Administration via: Straw;Cup Medication Administration: Crushed with puree Supervision: Staff to assist with self-feeding;Full supervision/cueing for swallowing strategies Swallowing strategies  : Slow rate;Small bites/sips;Hard cough after swallowing Postural changes: Stay upright 30-60 min after meals;Partially reclined for meals (partially recline pt until his neck is in a vertical position) Oral care recommendations: Oral care BID (2x/day) Caregiver Recommendations: Avoid jello, ice cream, thin soups, popsicles;Remove water pitcher      Leita SAILOR.,  M.A. CCC-SLP Acute Rehabilitation Services Office: [phone]  Secure chat preferred  07/31/2024,4:13 PM

## 2024-07-31 NOTE — Care Management Important Message (Signed)
 Important Message  Patient Details  Name: Timothy Mclean MRN: [id number] Date of Birth: 1940/10/01   Important Message Given:  Yes - Medicare IM     Jennie Laneta Dragon 07/31/2024, 1:07 PM

## 2024-08-01 DIAGNOSIS — E43 Unspecified severe protein-calorie malnutrition: Secondary | ICD-10-CM | POA: Insufficient documentation

## 2024-08-01 LAB — CBC WITH DIFFERENTIAL/PLATELET
Abs Immature Granulocytes: 0.1 K/uL — ABNORMAL HIGH (ref 0.00–0.07)
Basophils Absolute: 0 K/uL (ref 0.0–0.1)
Basophils Relative: 0 %
Eosinophils Absolute: 0 K/uL (ref 0.0–0.5)
Eosinophils Relative: 0 %
HCT: 26.9 % — ABNORMAL LOW (ref 39.0–52.0)
Hemoglobin: 9.2 g/dL — ABNORMAL LOW (ref 13.0–17.0)
Immature Granulocytes: 1 %
Lymphocytes Relative: 6 %
Lymphs Abs: 0.7 K/uL (ref 0.7–4.0)
MCH: 30.4 pg (ref 26.0–34.0)
MCHC: 34.2 g/dL (ref 30.0–36.0)
MCV: 88.8 fL (ref 80.0–100.0)
Monocytes Absolute: 0.5 K/uL (ref 0.1–1.0)
Monocytes Relative: 5 %
Neutro Abs: 9.5 K/uL — ABNORMAL HIGH (ref 1.7–7.7)
Neutrophils Relative %: 88 %
Platelets: 223 K/uL (ref 150–400)
RBC: 3.03 MIL/uL — ABNORMAL LOW (ref 4.22–5.81)
RDW: 14.6 % (ref 11.5–15.5)
WBC: 10.9 K/uL — ABNORMAL HIGH (ref 4.0–10.5)
nRBC: 0.2 % (ref 0.0–0.2)

## 2024-08-01 LAB — BASIC METABOLIC PANEL WITH GFR
Anion gap: 11 (ref 5–15)
BUN: 38 mg/dL — ABNORMAL HIGH (ref 8–23)
CO2: 22 mmol/L (ref 22–32)
Calcium: 8.2 mg/dL — ABNORMAL LOW (ref 8.9–10.3)
Chloride: 98 mmol/L (ref 98–111)
Creatinine, Ser: 1.24 mg/dL (ref 0.61–1.24)
GFR, Estimated: 57 mL/min — ABNORMAL LOW
Glucose, Bld: 283 mg/dL — ABNORMAL HIGH (ref 70–99)
Potassium: 3.4 mmol/L — ABNORMAL LOW (ref 3.5–5.1)
Sodium: 130 mmol/L — ABNORMAL LOW (ref 135–145)

## 2024-08-01 LAB — GLUCOSE, CAPILLARY
Glucose-Capillary: 161 mg/dL — ABNORMAL HIGH (ref 70–99)
Glucose-Capillary: 275 mg/dL — ABNORMAL HIGH (ref 70–99)
Glucose-Capillary: 266 mg/dL — ABNORMAL HIGH (ref 70–99)
Glucose-Capillary: 187 mg/dL — ABNORMAL HIGH (ref 70–99)

## 2024-08-01 MED ORDER — INSULIN ASPART 100 UNIT/ML IJ SOLN
3.0000 [IU] | Freq: Three times a day (TID) | INTRAMUSCULAR | Status: DC
  Administered 2024-08-02 (×2): 3 [IU] via SUBCUTANEOUS
  Filled 2024-08-01 (×3): qty 3

## 2024-08-01 MED ORDER — EMPAGLIFLOZIN 25 MG PO TABS
25.0000 mg | ORAL_TABLET | Freq: Every day | ORAL | Status: DC
  Filled 2024-08-01 (×2): qty 1

## 2024-08-01 MED ORDER — PANTOPRAZOLE SODIUM 40 MG IV SOLR
40.0000 mg | Freq: Two times a day (BID) | INTRAVENOUS | Status: DC
  Administered 2024-08-01 – 2024-08-02 (×3): 40 mg via INTRAVENOUS
  Filled 2024-08-01 (×3): qty 10

## 2024-08-01 MED ORDER — POTASSIUM CHLORIDE CRYS ER 20 MEQ PO TBCR
40.0000 meq | EXTENDED_RELEASE_TABLET | Freq: Once | ORAL | Status: AC
  Administered 2024-08-01: 40 meq via ORAL
  Filled 2024-08-01: qty 2

## 2024-08-01 MED ORDER — INSULIN ASPART 100 UNIT/ML IJ SOLN
2.0000 [IU] | Freq: Three times a day (TID) | INTRAMUSCULAR | Status: DC
  Administered 2024-08-01: 2 [IU] via SUBCUTANEOUS
  Filled 2024-08-01: qty 2

## 2024-08-01 NOTE — Progress Notes (Signed)
 Speech Language Pathology Treatment: Dysphagia  Patient Details Name: Timothy Mclean MRN: [id number] DOB: [birth year] Today's Date: 08/01/2024 Time: 8482-8455 SLP Time Calculation (min) (ACUTE ONLY): 27 min  Assessment / Plan / Recommendation Clinical Impression  Pt's son reports increased intake since diet changes. Education provided to pt and son regarding results from MBS on previous date. Pt was given trials of D2 solids and honey-thick liquids with one instance of wet vocal quality across 4 oz of liquid. Pt unable to produce hard cough despite cues and direct instruction. SLP helped with pt pacing of straw sips of liquids by manually removing straw as needed. Mild oral residue noted that cleared with liquid wash.   PLAN: continue current diet and strategies with emphasis on positioning and pacing for PO intake.    HPI: Patient is an 84 yo male presenting to the ED status post fall on 07/26/24.  Found to have R intertrochanteric femoral fracture, R IM nail completed on 1/8. SLP ordered 1/12 for swallow eval due to concern for PNA (R mid to lower lung field per CXR). Pt had a previous MBS in 2022 after having COVID with pharyngeal residue as well as penetration and aspiration (PAS 7, 8) with thin and nectar thick liquids. Dys 3 diet/honey thick liquids by cup recommended. PMH includes: dementia, Barrett's esophagus, COPD, GERD, hearing loss, CAD s/p CABG, HTN, chronic A. fib, DMII, asthma, hypothyroidism, CKD stage II      SLP Plan  Continue with current plan of care        Swallow Evaluation Recommendations   Recommendations: PO diet PO Diet Recommendation: Dysphagia 2 (Finely chopped);Moderately thick liquids (Level 3, honey thick) Liquid Administration via: Cup;Straw Medication Administration: Crushed with puree Supervision: Staff to assist with self-feeding;Full supervision/cueing for swallowing strategies Postural changes: Stay upright 30-60 min after meals (partially recline pt  until his neck is in a vertical position) Oral care recommendations: Oral care BID (2x/day) Caregiver Recommendations: Avoid jello, ice cream, thin soups, popsicles;Remove water pitcher     Recommendations                           Dysphagia, oropharyngeal phase (R13.12)     Continue with current plan of care     Rocky Quan  08/01/2024, 4:31 PM

## 2024-08-01 NOTE — Plan of Care (Signed)
   Problem: Education: Goal: Knowledge of General Education information will improve Description Including pain rating scale, medication(s)/side effects and non-pharmacologic comfort measures Outcome: Progressing   Problem: Health Behavior/Discharge Planning: Goal: Ability to manage health-related needs will improve Outcome: Progressing

## 2024-08-01 NOTE — TOC Progression Note (Signed)
 Transition of Care [facility]) - Progression Note    Patient Details  Name: Timothy Mclean MRN: [id number] Date of Birth: 1941-03-31  [facility]) CM/SW Contact  Bridget Cordella Simmonds, LCSW Phone Number: 08/01/2024, 2:01 PM  Clinical Narrative:   TC Tammy/HTA: SNF request, they are offering P2P with Dr Janit due to questions if pt will benefit from STR based on cognition and being hard of hearing.  [phone] by 5pm today.  PTAR is approved: [id number].  MD informed.     Expected Discharge Plan: Skilled Nursing Facility Barriers to Discharge: SNF Pending bed offer, Insurance Authorization               Expected Discharge Plan and Services       Living arrangements for the past 2 months: Single Family Home                                       Social Drivers of Health (SDOH) Interventions SDOH Screenings   Food Insecurity: Patient Unable To Answer (07/28/2024)  Housing: Patient Unable To Answer (07/28/2024)  Transportation Needs: Patient Unable To Answer (07/28/2024)  Utilities: Patient Unable To Answer (07/28/2024)  Social Connections: Patient Unable To Answer (07/28/2024)  Tobacco Use: Medium Risk (07/27/2024)    Readmission Risk Interventions     No data to display

## 2024-08-01 NOTE — Progress Notes (Signed)
 Nutrition Follow-up  DOCUMENTATION CODES:   Severe malnutrition in context of chronic illness  INTERVENTION:   Encourage PO intake Magic cup TID with meals, each supplement provides 290 kcal and 9 grams of protein - (Prefers butter pecan) Multivitamin PO once daily. 50,000 units Vitamin D  once weekly x 8 weeks then 2000 units daily thereafter for deficiency Monitor for diet advancement per SLP and ability to add additional ONS  High calorie, high protein handout in AVS  *ONS limited d/t honey thick liquid restriction   NUTRITION DIAGNOSIS:   Severe Malnutrition related to chronic illness as evidenced by severe muscle depletion, severe fat depletion. - New dx   GOAL:   Patient will meet greater than or equal to 90% of their needs - Progressing   MONITOR:   PO intake, Supplement acceptance, Diet advancement, Weight trends  REASON FOR ASSESSMENT:   Consult Hip fracture protocol  ASSESSMENT:   Patient presented with R intertrochanteric femoral fracture s/p fall. PMH significant for dementia with baseline disorientation, COPD, Afib STEMI, CAD s/p CABG, CKD2, DM2, GERD, Barrett's esophagus, ulcerative colitis, hypothyroidism, and HOH.  1/8 - s/p R IM nail, regular diet, thin liquids  1/11 - CXR showing pneumonia on the right. SLP consulted  1/12 - s/p MBS, Diet downgraded to DYS2 Honey Thick liquids    Pt resting in bed, wife at bedisdie provided hx. She endorses pt has lost weight but has been able to maintain his UBW of 140-150 lbs in the last couple of months. Reportedly was 220 lbs 1 year ago. Weight history reveals pt relatively stable in the last year ranging from 150-165 lbs. Weights on admission are questionable as  these are bed weights.   Pt with an increased appetite in the last month per wife. At baseline pt eats 2 meals per day, breakfast and dinner. Pt would have a bagel with fruit for breakfast and fast food or a home cooked meal for dinner. Started drinking 2  Ensure High protein shakes per day.   Pt s/p Right IM nailing. Post op pt with coughing episodes and pneumonia in right lung. SLP recommended DYS2 diet with honey thick liquids after MBS. Wife reports pt doing very well with this diet. Had 100% of his breakfast of grits, eggs, and sausage. Had 100% of his dinner last night.   Pt with good appetite since admission eating on average 75% of his meals across 7 recorded meals. Likely pt will do well on this restricted diet, may be difficult for him to meet his fluid needs. ONS is also limited d/t honey thick liquids, will add Magic cups. Discussed with Wife. Family has been ordering pt's melas for him.   On exam pt presents with severe muscle and severe fat wasting, pt does meet criteria for severe malnutrition. Encourage po intake.   Dispo: SNF  Admit weight: 74.8 kg Current weight: 74.8 kg   Average Meal Intake: 1/10-1/13: 85% intake x 7 recorded meals  Nutritionally Relevant Medications: Scheduled Meds:  insulin  aspart  0-15 Units Subcutaneous TID WC   insulin  aspart  0-5 Units Subcutaneous QHS   insulin  aspart  2 Units Subcutaneous TID WC   levothyroxine   75 mcg Oral Daily   midodrine   10 mg Oral TID WC   multivitamin with minerals  1 tablet Oral Daily   senna  2 tablet Oral QHS   Vitamin D  (Ergocalciferol )  50,000 Units Oral Q7 days   Labs Reviewed: Sodium 132 BUN 37 Creatinine 1.31 Vitamin D  25.0 (  Low) CBG ranges from 161-376 mg/dL over the last 24 hours HgbA1c 8.5  NUTRITION - FOCUSED PHYSICAL EXAM:  Flowsheet Row Most Recent Value  Orbital Region Severe depletion  Upper Arm Region Moderate depletion  Thoracic and Lumbar Region Severe depletion  Buccal Region Moderate depletion  Temple Region Moderate depletion  Clavicle Bone Region Severe depletion  Clavicle and Acromion Bone Region Severe depletion  Scapular Bone Region Severe depletion  Dorsal Hand Severe depletion  Patellar Region Moderate depletion  Anterior  Thigh Region Moderate depletion  Posterior Calf Region Severe depletion  Edema (RD Assessment) None  Hair Reviewed  Eyes Reviewed  Mouth Unable to assess  Skin Reviewed  Nails Reviewed    Diet Order:   Diet Order             DIET DYS 2 Room service appropriate? Yes with Assist; Fluid consistency: Honey Thick  Diet effective now                   EDUCATION NEEDS:   Education needs have been addressed  Skin:  Skin Assessment: Reviewed RN Assessment (anticipated incision) Skin Integrity Issues:: Other (Comment) Other: Closed surgical incision, hip  Last BM:  1/12, type 7 medium  Height:   Ht Readings from Last 1 Encounters:  07/27/24 6' (1.829 m)    Weight:   Wt Readings from Last 1 Encounters:  07/27/24 74.8 kg    Ideal Body Weight:  81 kg  BMI:  Body mass index is 22.37 kg/m.  Estimated Nutritional Needs:   Kcal:  1800-2000 kcal  Protein:  100-120 gm  Fluid:  >1.8L/day  Olivia Kenning, RD Registered Dietitian  See Amion for more information

## 2024-08-01 NOTE — Discharge Instructions (Signed)

## 2024-08-01 NOTE — Progress Notes (Signed)
 "        [facility]                                                                               Timothy Mclean, is a 84 y.o. male, DOB - 1940/12/07, FMW:[id number] Admit date - 07/26/2024    Outpatient Primary MD for the patient is Windy Coy, MD  LOS - 6  days    Brief summary   Timothy Mclean is a 84 y.o. male with past medical history  of dementia with history of delirium on Seroquel , asthma, COPD, chronic A-fib on Eliquis , STEMI, CAD status post CABG, most recent echo in 2022 showing EF of 60 to 65% aortic borderline dilatation at the root, CKD stage II, diabetes mellitus type 2, GERD and Barrett's esophagus, hypothyroidism, came in for a fall was found to have right intertrochanteric femoral fracture. Orthopedics consulted and he underwent cephalo medullary nail fixation. Overnight patient had an episode of hypotension , which resolved with fluid bolus.     Assessment & Plan    Assessment and Plan:  Right intertrochanteric femoral  fracture Orthopedics consulted and he underwent cephalo medullary nail fixation (long nail given proximal diaphyseal extension) as the standard treatment for unstable intertrochanteric fractures. Pain control. Therapy eval after the surgery.    Hypotension:  Suspect from hypovolemia from surgery.  Resolved with fluid bolus.  Added midodrine  for support , which is to be tapered off once BP parameters sustain in the normal range.  Monitor for episodes orthostatic hypotension.  Repeat orthostatics in am with PT.    Type 2 DM CBG (last 3)  Recent Labs    07/31/24 2141 08/01/24 0616 08/01/24 1132  GLUCAP 249* 161* 275*    Sliding scale insulin  while inpatient. Added novolog  2 units TIDAC.     Mild acute on stage IIIa CKD Baseline creatinine around 1.3 patient came in with a creatinine of 1.5. Improved to 1.3 with IV fludis and back to 1.6 this morning due to hypovolemia post op and hypotension.  Fluid boluses given and  maintenance fluids ordered.  Midodrine  also added to the regimen.    Persistent cough , / Aspiration pneumonia CXR showing pneumonia on the right.  Started him on IV ceftriaxone   and azithromycin .  Added robitussin.  Suspect aspiration. SLP eval recommended MBS , which showed reduced airway protection and efficiency. He is further impacted by posture. SLP recommended dysphagia 2 diet with honey thick liquids.  Low grade temp, of 99.5, and worsening leukocytosis. Repeat labs pending today.    Hyperkalemia Probably from dehydration Resolved .     Mild hyponatremia SODIUM OF 132.    History of chronic atrial fibrillation Rate between 90 tp 110/min.   Patient on cardizem  120 mg daily, unable to increase the cardizem  due to borderline BP parameters.  Fluids given.  On eliquis  for anti coagulation.     Distended bladder with bilateral hydroureteronephrosis Foley catheter placed Recommend outpatient follow-up with urology if persistent hydroureteronephrosis Recommend repeat US  of kidneys in 1 to 2 weeks to evaluate for resolution.    GERD/Barrett's esophagitis Continue with Protonix  twice daily   Hypothyroidism Continue with Synthroid   VITAMIN D  deficiency.  Replacements ordered.    History of COPD/asthma/pulmonary fibrosis Patient is on 2 L of nasal cannula oxygen  at home    Orthostatic hypotension When working with PT.  Started the patient on NS Fluids and added midodrine , will increase the dose of midodrine  to 10 mg TID.    Urinary Retention S/p foley catheter placement. Recommend outpatient follow up with Urology for voiding trial.      RN Pressure Injury Documentation:    Malnutrition Type:  Nutrition Problem: Increased nutrient needs Etiology: hip fracture   Malnutrition Characteristics:  Signs/Symptoms: estimated needs   Nutrition Interventions:  Interventions: MVI, Glucerna shake  Estimated body mass index is 22.37 kg/m as calculated  from the following:   Height as of this encounter: 6' (1.829 m).   Weight as of this encounter: 74.8 kg.  Code Status: full code.  DVT Prophylaxis:  SCDs Start: 07/26/24 1725 apixaban  (ELIQUIS ) tablet 5 mg   Level of Care: Level of care: Telemetry Family Communication: none at bedside  Disposition Plan:     Remains inpatient appropriate:  pending.   Procedures:  cephalo medullary nail fixation (long nail given proximal diaphyseal extension)  Consultants:   Orthopedics SLP  Antimicrobials:   Anti-infectives (From admission, onward)    Start     Dose/Rate Route Frequency Ordered Stop   07/30/24 2000  cefTRIAXone  (ROCEPHIN ) 1 g in sodium chloride  0.9 % 100 mL IVPB        1 g 200 mL/hr over 30 Minutes Intravenous Every 24 hours 07/30/24 1851     07/30/24 2000  azithromycin  (ZITHROMAX ) tablet 500 mg        500 mg Oral Daily at bedtime 07/30/24 1851 08/04/24 2159   07/28/24 1430  terbinafine  (LAMISIL ) tablet 250 mg  Status:  Discontinued        250 mg Oral Daily 07/28/24 1340 07/28/24 1358   07/27/24 0758  ceFAZolin  (ANCEF ) 2-4 GM/100ML-% IVPB       Note to Pharmacy: [name] S: cabinet override      07/27/24 0758 07/27/24 2014        Medications  Scheduled Meds:  apixaban   5 mg Oral BID   azithromycin   500 mg Oral QHS   balsalazide  1,500 mg Oral BID   Chlorhexidine  Gluconate Cloth  6 each Topical Daily   cholecalciferol   2,000 Units Oral BID   diltiazem   120 mg Oral q AM   Dulaglutide   1.5 mg Injection Weekly   empagliflozin   25 mg Oral Daily   ezetimibe   10 mg Oral Daily   feeding supplement (GLUCERNA SHAKE)  237 mL Oral TID BM   finasteride   5 mg Oral Daily   fluticasone  furoate-vilanterol  1 puff Inhalation Daily   insulin  aspart  0-15 Units Subcutaneous TID WC   insulin  aspart  0-5 Units Subcutaneous QHS   insulin  aspart  2 Units Subcutaneous TID WC   levothyroxine   75 mcg Oral Daily   midodrine   10 mg Oral TID WC   multivitamin with minerals  1  tablet Oral Daily   pantoprazole  (PROTONIX ) IV  40 mg Intravenous Q12H   rosuvastatin   40 mg Oral QPM   senna  2 tablet Oral QHS   Vitamin D  (Ergocalciferol )  50,000 Units Oral Q7 days   Continuous Infusions:  cefTRIAXone  (ROCEPHIN )  IV Stopped (07/31/24 2034)    PRN Meds:.albuterol , bisacodyl , food thickener, guaiFENesin -dextromethorphan , HYDROcodone -acetaminophen , methocarbamol  **OR** methocarbamol  (ROBAXIN ) injection, morphine  injection, polyethylene glycol    Subjective:   Timothy Mclean  was seen and examined today. Cough has improved. No new complaints overnight.   Objective:   Vitals:   08/01/24 0616 08/01/24 0725 08/01/24 0832 08/01/24 0848  BP: (!) 115/58 (!) 91/51  (!) 110/57  Pulse:  83    Resp:  17    Temp:  99.5 F (37.5 C)    TempSrc:  Oral    SpO2:   95%   Weight:      Height:        Intake/Output Summary (Last 24 hours) at 08/01/2024 1238 Last data filed at 08/01/2024 0900 Gross per 24 hour  Intake 820 ml  Output 1550 ml  Net -730 ml   Filed Weights   07/26/24 1421 07/27/24 0658  Weight: 74.8 kg 74.8 kg     Exam General exam: elderly gentleman, not in distress.  Respiratory system: air entry fair. On RA.  Cardiovascular system: S1 & S2 heard, irregularly irregular.  Gastrointestinal system: Abdomen is nondistended, soft and nontender.  Central nervous system: Alert and oriented to person and place.  Extremities: no pedal edema.  Skin: No rashes, Psychiatry: Mood & affect appropriate.       Data Reviewed:  I have personally reviewed following labs and imaging studies   CBC Lab Results  Component Value Date   WBC 16.1 (H) 07/31/2024   RBC 3.55 (L) 07/31/2024   HGB 10.7 (L) 07/31/2024   HCT 31.6 (L) 07/31/2024   MCV 89.0 07/31/2024   MCH 30.1 07/31/2024   PLT 231 07/31/2024   MCHC 33.9 07/31/2024   RDW 14.5 07/31/2024   LYMPHSABS 0.8 07/31/2024   MONOABS 1.1 (H) 07/31/2024   EOSABS 0.0 07/31/2024   BASOSABS 0.0 07/31/2024      Last metabolic panel Lab Results  Component Value Date   NA 132 (L) 07/31/2024   K 4.0 07/31/2024   CL 98 07/31/2024   CO2 24 07/31/2024   BUN 37 (H) 07/31/2024   CREATININE 1.31 (H) 07/31/2024   GLUCOSE 196 (H) 07/31/2024   GFRNONAA 53 (L) 07/31/2024   GFRAA 38 (L) 08/21/2019   CALCIUM  8.6 (L) 07/31/2024   PROT 6.8 07/26/2024   ALBUMIN  3.8 07/26/2024   LABGLOB 2.2 08/21/2019   AGRATIO 2.3 (H) 08/21/2019   BILITOT 0.5 07/26/2024   ALKPHOS 122 07/26/2024   AST 45 (H) 07/26/2024   ALT 23 07/26/2024   ANIONGAP 11 07/31/2024    CBG (last 3)  Recent Labs    07/31/24 2141 08/01/24 0616 08/01/24 1132  GLUCAP 249* 161* 275*      Coagulation Profile: Recent Labs  Lab 07/26/24 1512  INR 1.3*     Radiology Studies: DG Swallowing Func-Speech Pathology Result Date: 07/31/2024 Table formatting from the original result was not included. Modified Barium Swallow Study Patient Details Name: MCGUIRE GASPARYAN MRN: [id number] Date of Birth: 01/19/1941 Today's Date: 07/31/2024 HPI/PMH: HPI: Patient is an 84 yo male presenting to the ED status post fall on 07/26/24.  Found to have R intertrochanteric femoral fracture, R IM nail completed on 1/8. SLP ordered 1/12 for swallow eval due to concern for PNA (R mid to lower lung field per CXR). Pt had a previous MBS in 2022 after having COVID with pharyngeal residue as well as penetration and aspiration (PAS 7, 8) with thin and nectar thick liquids. Dys 3 diet/honey thick liquids by cup recommended. PMH includes: dementia, Barrett's esophagus, COPD, GERD, hearing loss, CAD s/p CABG, HTN, chronic A. fib, DMII, asthma, hypothyroidism, CKD stage II Clinical Impression: Pt  presents with an oropharyngeal dysphagia as well as suspected underlying esophageal component. He exhibits reduced airway protection (silent aspiration) and efficiency. He is further impacted by posture and benefits from a slightly reclined posture, so that his neck and pharynx are in a  more vertical position for intake. With this positioning, still recommend Dys 2 (finely chopped) diet and honey thick liquids. Pt has oral deficits that include a lot of repetitive lingual motion to attempt posterior transit, leaving collections of residue diffusely throughout his oral cavity. No overt anterior loss was noted though, and mastication seems to be fairly appropriate. Pharyngeally he has reduced base of tongue retraction, pharyngeal squeeze, and hyolaryngeal movement. His epiglottis has very little movement backwards, and no downward inversion. He has incomplete airway protection but also incomplete pharyngeal clearance, leaving collections of residue diffusely in his pharynx as well. When also factoring in his kyphotic posture, with pharynx almost completely horizontal, his positioning further allows for barium to enter his airway during the swallow and after. Aspiration is observed with thin and nectar thick liquids, but also suspected with initial sips of honey thick liquids (PAS 8, silent aspiration). This is difficult to differentiate given presence of persistent barium from thinner consistencies, but once he could cough and clear his laryngeal vestibule further, there were no other instances of aspiration with honey thick liquids or solids. Pt did his relative best when positioned slightly reclined, so that his pharynx was in a more vertical position. Even in this position there is trace penetration of honey thick liquids and purees, but a cued cough is effective at clearing his laryngeal vestibule. Note that his esophagus had retained barium throughout the distal portion upon brief esophageal sweep. Factors that may increase risk of adverse event in presence of aspiration Noe & Lianne 2021): Factors that may increase risk of adverse event in presence of aspiration Noe & Lianne 2021): Respiratory or GI disease; Reduced cognitive function; Limited mobility; Frail or deconditioned;  Dependence for feeding and/or oral hygiene; Aspiration of thick, dense, and/or acidic materials Recommendations/Plan: Swallowing Evaluation Recommendations Swallowing Evaluation Recommendations Recommendations: PO diet PO Diet Recommendation: Dysphagia 2 (Finely chopped); Moderately thick liquids (Level 3, honey thick) Liquid Administration via: Straw; Cup Medication Administration: Crushed with puree Supervision: Staff to assist with self-feeding; Full supervision/cueing for swallowing strategies Swallowing strategies  : Slow rate; Small bites/sips; Hard cough after swallowing Postural changes: Stay upright 30-60 min after meals; Partially reclined for meals (partially recline pt until his neck is in a vertical position) Oral care recommendations: Oral care BID (2x/day) Caregiver Recommendations: Avoid jello, ice cream, thin soups, popsicles; Remove water pitcher Treatment Plan Treatment Plan Treatment recommendations: Therapy as outlined in treatment plan below Follow-up recommendations: Skilled nursing-short term rehab (<3 hours/day) Functional status assessment: Patient has had a recent decline in their functional status and demonstrates the ability to make significant improvements in function in a reasonable and predictable amount of time. Treatment frequency: Min 2x/week Treatment duration: 2 weeks Interventions: Aspiration precaution training; Oropharyngeal exercises; Compensatory techniques; Patient/family education; Trials of upgraded texture/liquids; Diet toleration management by SLP; Respiratory muscle strength training Recommendations Recommendations for follow up therapy are one component of a multi-disciplinary discharge planning process, led by the attending physician.  Recommendations may be updated based on patient status, additional functional criteria and insurance authorization. Assessment: Orofacial Exam: Orofacial Exam Oral Cavity: Oral Hygiene: WFL Oral Cavity - Dentition: Edentulous Anatomy:  Anatomy: Other (Comment) (pt with kyphotic posture with pharynx horizontal at baseline) Boluses Administered: Boluses Administered  Boluses Administered: Thin liquids (Level 0); Mildly thick liquids (Level 2, nectar thick); Moderately thick liquids (Level 3, honey thick); Puree; Solid  Oral Impairment Domain: Oral Impairment Domain Lip Closure: -- (difficult to visualize lips in frame but no overt anterior loss) Tongue control during bolus hold: Not tested Bolus preparation/mastication: Timely and efficient chewing and mashing Bolus transport/lingual motion: Repetitive/disorganized tongue motion Oral residue: Residue collection on oral structures Location of oral residue : Floor of mouth; Lateral sulci; Tongue Initiation of pharyngeal swallow : Pyriform sinuses  Pharyngeal Impairment Domain: Pharyngeal Impairment Domain Soft palate elevation: No bolus between soft palate (SP)/pharyngeal wall (PW) Laryngeal elevation: Partial superior movement of thyroid  cartilage/partial approximation of arytenoids to epiglottic petiole Anterior hyoid excursion: Partial anterior movement Epiglottic movement: Partial inversion Laryngeal vestibule closure: Incomplete, narrow column air/contrast in laryngeal vestibule Pharyngeal stripping wave : Present - diminished Pharyngeal contraction (A/P view only): N/A Pharyngoesophageal segment opening: Partial distention/partial duration, partial obstruction of flow Tongue base retraction: Narrow column of contrast or air between tongue base and PPW Pharyngeal residue: Collection of residue within or on pharyngeal structures Location of pharyngeal residue: Valleculae; Pyriform sinuses; Aryepiglottic folds; Pharyngeal wall; Diffuse (>3 areas)  Esophageal Impairment Domain: Esophageal Impairment Domain Esophageal clearance upright position: Esophageal retention Pill: Pill Consistency administered: -- (deferred) Penetration/Aspiration Scale Score: Penetration/Aspiration Scale Score 1.  Material  does not enter airway: Solid 3.  Material enters airway, remains ABOVE vocal cords and not ejected out: Puree 8.  Material enters airway, passes BELOW cords without attempt by patient to eject out (silent aspiration) : Thin liquids (Level 0); Mildly thick liquids (Level 2, nectar thick); Moderately thick liquids (Level 3, honey thick) Compensatory Strategies: Compensatory Strategies Compensatory strategies: Yes Reclining posture: Effective; Ineffective Effective Reclining Posture: Moderately thick liquid (Level 3, honey thick); Puree; Solid Ineffective Reclining Posture: Mildly thick liquid (Level 2, nectar thick)   General Information: Caregiver present: No  Diet Prior to this Study: Regular; Thin liquids (Level 0)   Temperature : Normal   Respiratory Status: WFL   Supplemental O2: None (Room air)   History of Recent Intubation: Yes  Behavior/Cognition: Alert; Cooperative; Pleasant mood; Other (Comment) (HOH, does not have hearing aid here) Self-Feeding Abilities: Needs assist with self-feeding Baseline vocal quality/speech: Normal Volitional Cough: Able to elicit Volitional Swallow: Able to elicit Exam Limitations: Poor positioning; Limited visibility Goal Planning: Prognosis for improved oropharyngeal function: Good Barriers to Reach Goals: Cognitive deficits No data recorded Patient/Family Stated Goal: none stated Consulted and agree with results and recommendations: Patient Pain: Pain Assessment Pain Assessment: Faces Faces Pain Scale: 0 Breathing: 1 Negative Vocalization: 2 Facial Expression: 2 Body Language: 0 Consolability: 1 PAINAD Score: 6 End of Session: Start Time:SLP Start Time (ACUTE ONLY): 1505 Stop Time: SLP Stop Time (ACUTE ONLY): 1524 Time Calculation:SLP Time Calculation (min) (ACUTE ONLY): 19 min Charges: SLP Evaluations $ SLP Speech Visit: 1 Visit SLP Evaluations $BSS Swallow: 1 Procedure $MBS Swallow: 1 Procedure SLP visit diagnosis: SLP Visit Diagnosis: Dysphagia, oropharyngeal phase (R13.12)  Past Medical History: Past Medical History: Diagnosis Date  Allergy   Anxiety   Asthma   since 2013  Barrett's esophagus 03/09/2014  Bilateral lumbar radiculopathy   since 2013  CAD S/P percutaneous coronary angioplasty 1987  a. 1987 angioplasty and BMS- Cx; b. 1997 CABG x 4;  c. MYOVIEW  1/'05: ? Ischemia vs. artifact --> sent for cath - patent grafts.  Chronic atrial fibrillation (HCC)   a. Prev on amiodarone -->d/c 08/2016 in setting of recurrent AF, LFT abnormalities,  and pulmonary symptoms;  b. CHA2DS2VASc = 4-->eliquis .  Chronic bronchitis (HCC)   since 2007  COPD (chronic obstructive pulmonary disease) (HCC)   DDD (degenerative disc disease)   Diverticulosis   Dyslipidemia, goal LDL below 70   Doing better off of Zocor, currently on Lescol   ED (erectile dysfunction)   Elevated LFTs   Esophageal reflux   GERD (gastroesophageal reflux disease)   Gout, unspecified   Hearing loss   Helicobacter pylori gastritis 07/2009  Herpes zoster 2009  Hypothyroidism   since 2010  Iron deficiency anemia, unspecified   Leg weakness   a. 09/2016 ABI's: R 1.05, L 0.98.  Other and unspecified hyperlipidemia   Other B-complex deficiencies   Personal history of colonic polyps   Prostatitis   chronic ulcerative  Pulmonary fibrosis (HCC)   and bronchiectasis, since 2015  Renal stone   Right  S/P CABG x 4 1997  a) LIMA-LAD, SVG to OM, SVG-dRCA-RPL; b) FALSE + MYOVIEW  --> CATH 1/'05: 100% LAD after widelly patent D1, all Cx OM branches 100%, pRCA 100%, SVG-PDA patent w/ retrograde filling of RPL, SVG-OM widely patent ~ normal OM, LIMA-LAD patent.  Spinal stenosis   ST elevation myocardial infarction (STEMI) of inferior wall, subsequent episode of care (HCC) 1987, 1997  a) PTCA of Cx; b) CABG   Type II or unspecified type diabetes mellitus without mention of complication, not stated as uncontrolled   no meds  Ulcerative colitis, unspecified   Unspecified essential hypertension   Vitamin D  deficiency  Past Surgical History: Past Surgical  History: Procedure Laterality Date  BLEPHAROPLASTY Bilateral   for ptosis  CARDIAC CATHETERIZATION  08/17/2003  False positive stress test  grafts patent; RCA proximal 100% LAD 100% occlusion after normal D1 with 80% ostial as SP1; circumflex patent but OM1 on to all occluded; EF 50-55%  CARDIAC CATHETERIZATION  11/1995  Preop CABG: LAD 90% at D1, circumflex 100 and OM a 90% proximal, 80% distal  Carotid Duplex Doppler  12/23/2009  Right&Left ICAs 0-49%, mildly abnormal study,   CHOLECYSTECTOMY    COLONOSCOPY    CORONARY ARTERY BYPASS GRAFT  11/1995  LIMA-LAD, SVG to OM, SVG-dRCA-RPL  ESOPHAGOGASTRODUODENOSCOPY    FLEXIBLE SIGMOIDOSCOPY    INTRAMEDULLARY (IM) NAIL INTERTROCHANTERIC Right 07/27/2024  Procedure: FIXATION, FRACTURE, INTERTROCHANTERIC, WITH INTRAMEDULLARY ROD RIGHT HIP;  Surgeon: Celena Sharper, MD;  Location: [facility] OR;  Service: Orthopedics;  Laterality: Right;  NM MYOCAR PERF EJECTION FRACTION  07/03/2003  FALSE POSITIVE TEST; Bruce protocol, negative test with scintigraphic evidence of inferoapical scar, diaphragmatic attenuation, moderate ischemia.  NM MYOVIEW  LTD  2005  Questionable ischemia that is likely either infarct versus artifact; normal  TRANSTHORACIC ECHOCARDIOGRAM  12/2017  EF 55-60%.  Unable to assess diastolic function due to A. fib.  Moderate LA/RA dilation.  Mild MR. Leita SAILOR., M.A. CCC-SLP Acute Rehabilitation Services Office: [phone] Secure chat preferred 07/31/2024, 4:27 PM  DG CHEST PORT 1 VIEW Result Date: 07/30/2024 CLINICAL DATA:  Cough. EXAM: PORTABLE CHEST 1 VIEW COMPARISON:  07/26/2024, 01/16/2014. FINDINGS: The heart size and mediastinal contours are within normal limits. There is atherosclerotic calcification of the aorta. Emphysematous changes and interstitial prominence is present bilaterally. Airspace disease is noted in the mid to lower lung field on the right. Mild atelectasis is noted at the left lung base. There is a small right pleural effusion. No pneumothorax  is seen. No acute osseous abnormality. IMPRESSION: 1. Airspace disease in the mid to lower lung field on the right, suspicious for pneumonia. 2.  Small right pleural effusion. 3. Emphysematous changes. Electronically Signed   By: Leita Birmingham M.D.   On: 07/30/2024 18:20        Elgie Butter M.D. [facility] 08/01/2024, 12:38 PM  Available via Epic secure chat 7am-7pm After 7 pm, please refer to night coverage provider listed on amion.    "

## 2024-08-02 LAB — CBC WITH DIFFERENTIAL/PLATELET
Abs Immature Granulocytes: 0.19 K/uL — ABNORMAL HIGH (ref 0.00–0.07)
Basophils Absolute: 0 K/uL (ref 0.0–0.1)
Basophils Relative: 0 %
Eosinophils Absolute: 0.1 K/uL (ref 0.0–0.5)
Eosinophils Relative: 0 %
HCT: 28.9 % — ABNORMAL LOW (ref 39.0–52.0)
Hemoglobin: 9.9 g/dL — ABNORMAL LOW (ref 13.0–17.0)
Immature Granulocytes: 2 %
Lymphocytes Relative: 7 %
Lymphs Abs: 0.8 K/uL (ref 0.7–4.0)
MCH: 30.6 pg (ref 26.0–34.0)
MCHC: 34.3 g/dL (ref 30.0–36.0)
MCV: 89.2 fL (ref 80.0–100.0)
Monocytes Absolute: 0.9 K/uL (ref 0.1–1.0)
Monocytes Relative: 7 %
Neutro Abs: 9.9 K/uL — ABNORMAL HIGH (ref 1.7–7.7)
Neutrophils Relative %: 84 %
Platelets: 264 K/uL (ref 150–400)
RBC: 3.24 MIL/uL — ABNORMAL LOW (ref 4.22–5.81)
RDW: 14.6 % (ref 11.5–15.5)
WBC: 11.8 K/uL — ABNORMAL HIGH (ref 4.0–10.5)
nRBC: 0.3 % — ABNORMAL HIGH (ref 0.0–0.2)

## 2024-08-02 LAB — BASIC METABOLIC PANEL WITH GFR
Anion gap: 11 (ref 5–15)
BUN: 36 mg/dL — ABNORMAL HIGH (ref 8–23)
CO2: 23 mmol/L (ref 22–32)
Calcium: 8.4 mg/dL — ABNORMAL LOW (ref 8.9–10.3)
Chloride: 102 mmol/L (ref 98–111)
Creatinine, Ser: 1.14 mg/dL (ref 0.61–1.24)
GFR, Estimated: >60 mL/min
Glucose, Bld: 186 mg/dL — ABNORMAL HIGH (ref 70–99)
Potassium: 4.1 mmol/L (ref 3.5–5.1)
Sodium: 136 mmol/L (ref 135–145)

## 2024-08-02 LAB — GLUCOSE, CAPILLARY
Glucose-Capillary: 201 mg/dL — ABNORMAL HIGH (ref 70–99)
Glucose-Capillary: 179 mg/dL — ABNORMAL HIGH (ref 70–99)
Glucose-Capillary: 208 mg/dL — ABNORMAL HIGH (ref 70–99)
Glucose-Capillary: 223 mg/dL — ABNORMAL HIGH (ref 70–99)

## 2024-08-02 MED ORDER — SENNA 8.6 MG PO TABS: 2.0000 | ORAL_TABLET | Freq: Every day | ORAL | Status: AC

## 2024-08-02 MED ORDER — FOOD THICKENER (SIMPLYTHICK HONEY): 1.0000 | ORAL | Status: DC | PRN

## 2024-08-02 MED ORDER — ADULT MULTIVITAMIN W/MINERALS CH: 1.0000 | ORAL_TABLET | Freq: Every day | ORAL | Status: DC

## 2024-08-02 MED ORDER — CEFDINIR 300 MG PO CAPS: 300.0000 mg | ORAL_CAPSULE | Freq: Two times a day (BID) | ORAL | Status: AC

## 2024-08-02 MED ORDER — MIDODRINE HCL 10 MG PO TABS: 10.0000 mg | ORAL_TABLET | Freq: Three times a day (TID) | ORAL | Status: DC

## 2024-08-02 MED ORDER — AZITHROMYCIN 500 MG PO TABS: 500.0000 mg | ORAL_TABLET | Freq: Every day | ORAL | Status: AC

## 2024-08-02 MED ORDER — HYDROCODONE-ACETAMINOPHEN 5-325 MG PO TABS: 1.0000 | ORAL_TABLET | Freq: Four times a day (QID) | ORAL | 0 refills | Status: DC | PRN

## 2024-08-02 MED ORDER — VITAMIN D (ERGOCALCIFEROL) 1.25 MG (50000 UNIT) PO CAPS: 50000.0000 [IU] | ORAL_CAPSULE | ORAL | Status: DC

## 2024-08-02 NOTE — Plan of Care (Signed)

## 2024-08-02 NOTE — Progress Notes (Addendum)
 Rounded on patient and found patient was discharged via ambulance. This RN was not notified. IV intact on discharge. Notified Ebony RN at facility to remove IV.

## 2024-08-02 NOTE — TOC Transition Note (Signed)
 Transition of Care [facility]) - Discharge Note   Patient Details  Name: Timothy Mclean MRN: [id number] Date of Birth: [birth year]  [facility] CM/SW Contact:  Bridget Cordella Simmonds, LCSW Phone Number: 08/02/2024, 3:05 PM   Clinical Narrative:   Pt discharging to [facility]. RN call report to [phone].   PTAR called [phone].   Final next level of care: Skilled Nursing Facility Barriers to Discharge: Barriers Resolved   Patient Goals and CMS Choice     Choice offered to / list presented to : Adult Children      Discharge Placement              Patient chooses bed at: [facility] Patient to be transferred to facility by: ptar Name of family member notified: wife and son in room Patient and family notified of of transfer: 08/02/24  Discharge Plan and Services Additional resources added to the After Visit Summary for                                       Social Drivers of Health (SDOH) Interventions SDOH Screenings   Food Insecurity: Patient Unable To Answer (07/28/2024)  Housing: Patient Unable To Answer (07/28/2024)  Transportation Needs: Patient Unable To Answer (07/28/2024)  Utilities: Patient Unable To Answer (07/28/2024)  Social Connections: Patient Unable To Answer (07/28/2024)  Tobacco Use: Medium Risk (07/27/2024)     Readmission Risk Interventions     No data to display

## 2024-08-02 NOTE — TOC Progression Note (Addendum)
 Transition of Care [facility]) - Progression Note    Patient Details  Name: FILLMORE BYNUM MRN: [id number] Date of Birth: March 05, 1941  Transition of Care [facility]) CM/SW Contact  Bridget Cordella Simmonds, LCSW Phone Number: 08/02/2024, 11:29 AM  Clinical Narrative:   Message from Tammy/[facility]: SNF approved, 7 days: [id number].  CSW message with [facility]: no beds available today, unclear if bed available tomorrow.  CSW spoke with pt wife, she would consider other SNF if it is going to be multiple days for [facility].  1200: Wife and son would like to accept offer at [facility].  CSW confirmed with [facility]: they can receive pt today.  MD notified.  1440: CSW spoke with Connie/[facility]: she can change facility from [facility] to [facility].    Expected Discharge Plan: Skilled Nursing Facility Barriers to Discharge: SNF Pending bed offer, Insurance Authorization               Expected Discharge Plan and Services       Living arrangements for the past 2 months: Single Family Home                                       Social Drivers of Health (SDOH) Interventions SDOH Screenings   Food Insecurity: Patient Unable To Answer (07/28/2024)  Housing: Patient Unable To Answer (07/28/2024)  Transportation Needs: Patient Unable To Answer (07/28/2024)  Utilities: Patient Unable To Answer (07/28/2024)  Social Connections: Patient Unable To Answer (07/28/2024)  Tobacco Use: Medium Risk (07/27/2024)    Readmission Risk Interventions     No data to display

## 2024-08-02 NOTE — Discharge Summary (Signed)
 " Physician Discharge Summary   Patient: Timothy Mclean MRN: [id number] DOB: May 12, 1941  Admit date:     07/26/2024  Discharge date: 08/02/2024  Discharge Physician: Elgin Lam, MD   PCP: Windy Coy, MD   Recommendations at discharge:  PCP visit for hospital follow-up Urology visit for hospital follow-up of hydroureteronephrosis and foley catheter Orthopedic surgery follow-up for post-op care  Discharge Diagnoses: Principal Problem:   Closed right hip fracture, initial encounter [facility]) Active Problems:   Type II or unspecified type diabetes mellitus without mention of complication, not stated as uncontrolled   Protein-calorie malnutrition, severe  Resolved Problems:   * No resolved hospital problems. *  Hospital Course: Timothy Mclean is a 84 y.o. male with a history of dementia, asthma, COPD, atrial fibrillation, CAD s/p STEMI and CABG, CKD stage IIIa, diabetes mellitus type 2, GERD.  Patient presented secondary to right hip pain after a fall and fond to have a right hip fracture. Orthopedic surgery consulted and performed an intramedullary nail placement. PT and OT recommendation for discharge to SNF. Hospitalization complicated by dysphagia and aspiration pneumonia, requiring antibiotics, in addition to bilateral hydroureteronephrosis, requiring foley catheter placement.  Assessment and Plan:  Right hip fracture Patient suffered a right comminuted nondisplaced right intertrochanteric femur fracture with associated proximal diaphyseal fracture after a fall from ambulation. Orthopedic surgery consulted and performed intramedullary nailing on 1/8. Patient was evaluated by PT/OT with recommendation for SNF. Orthopedic surgery for weight-bearing as tolerated.  Orthostatic hypotension Patient has a history of medication related hypotension. This admission, patient with low-normal blood pressure. Patient was started on midodrine  with improved blood pressure. Continue midodrine  on  discharge and recommend patient to follow-up with cardiology.  Dysphagia Patient was seen and evaluated by speech therapy. Speech therapy recommendations for diet: Dysphagia 2 (Finely chopped);Moderately thick liquids (Level 3, honey thick) Liquid Administration via: Cup;Straw Medication Administration: Crushed with puree Supervision: Staff to assist with self-feeding;Full supervision/cueing for swallowing strategies Postural changes: Stay upright 30-60 min after meals (partially recline pt until his neck is in a vertical position) Oral care recommendations: Oral care BID (2x/day) Caregiver Recommendations: Avoid jello, ice cream, thin soups, popsicles;Remove water pitcher  Aspiration pneumonia Presumed related to dysphagia. Patient started on empiric treatment with ceftriaxone  and azithromycin  with improvement of symptoms. Continue Cefdinir  and azithromycin  on discharge to complete treatment course.  Diabetes mellitus type 2 Poorly controlled based on hemoglobin A1C of 8.5%, even for age. Continue home regimen. Recommend a carb modified diet.  Dementia Noted. Stable.  AKI on CKD stage IIIa Unclear prior baseline, but creatinine improved to a low of 1.14 this admission. Peak creatinine of 1.61 that improved with IV fluids. AKI resolved.  Chronic atrial fibrillation Stable heart rate. Continue Eliquis .  Hyperkalemia Presumed secondary to dehydration. Mild, up to 5.3. Resolved.  Hypokalemia Mild. Resolved with supplementation.  Hyponatremia Mild. Resolved with IV fluids.  Bilateral hydroureteronephrosis Foley catheter was placed on 1/7. Recommendation for outpatient urology follow-up.  Hypothyroidism Continue Synthroid .  CAD History of STEMI and CABG.  On Eliquis  only.  Vitamin D  deficiency Continue vitamin D  supplementation.  COPD Asthma Pulmonary fibrosis Continue Advair .   Consultants: Orthopedic surgery Procedures performed: Intramedullary nailing of the right  hip  Disposition: Skilled nursing facility Diet recommendation: Dysphagia 2 (Finely chopped);Moderately thick liquids (Level 3, honey thick) Liquid Administration via: Cup;Straw Medication Administration: Crushed with puree Supervision: Staff to assist with self-feeding;Full supervision/cueing for swallowing strategies Postural changes: Stay upright 30-60 min after meals (partially recline pt until  his neck is in a vertical position) Oral care recommendations: Oral care BID (2x/day) Caregiver Recommendations: Avoid jello, ice cream, thin soups, popsicles;Remove water pitcher   DISCHARGE MEDICATION: Allergies as of 08/02/2024       Reactions   Atorvastatin Other (See Comments)   Arthralgia, myalgia   Fluvastatin Other (See Comments)   Arthralgia, myalgias   Hymenoptera Venom Preparations Other (See Comments)   Unknown    Lisinopril Cough   Metformin And Related Other (See Comments)   Anorexia, diarrhea, nausea, weight loss   Pravastatin Other (See Comments)   Arthralgia, myalgias   Simvastatin Other (See Comments)   myalgias   Spiriva Handihaler [tiotropium Bromide] Nausea Only        Medication List     PAUSE taking these medications    cholecalciferol  25 MCG (1000 UNIT) tablet Wait to take this until: August 20, 2024 Commonly known as: VITAMIN D3 Take 1,000 Units by mouth daily.       STOP taking these medications    chlorhexidine  4 % external liquid Commonly known as: Hibiclens    Januvia 100 MG tablet Generic drug: sitaGLIPtin   terbinafine  250 MG tablet Commonly known as: LAMISIL        TAKE these medications    albuterol  108 (90 Base) MCG/ACT inhaler Commonly known as: VENTOLIN  HFA Inhale 2 puffs into the lungs every 4 (four) hours as needed for chest congestion, cough, shortness of breath, or wheezing   azithromycin  500 MG tablet Commonly known as: Zithromax  Take 1 tablet (500 mg total) by mouth daily for 2 days.   balsalazide 750 MG  capsule Commonly known as: COLAZAL  TAKE 2 CAPSULES BY MOUTH TWICE DAILY What changed:  how much to take how to take this when to take this additional instructions   cefdinir  300 MG capsule Commonly known as: OMNICEF  Take 1 capsule (300 mg total) by mouth 2 (two) times daily for 2 days.   cyanocobalamin  1000 MCG tablet Take 1,000 mcg by mouth at bedtime.   diltiazem  120 MG 24 hr capsule Commonly known as: CARDIZEM  CD Take 1 capsule (120 mg total) by mouth in the morning.   Eliquis  5 MG Tabs tablet Generic drug: apixaban  Take 1 tablet (5 mg total) by mouth 2 (two) times daily.   EPINEPHrine  0.3 mg/0.3 mL Soaj injection Commonly known as: EpiPen  2-Pak Inject 0.3 mg into the muscle as needed for allergic reaction   ezetimibe  10 MG tablet Commonly known as: ZETIA  Take 1 tablet (10 mg total) by mouth daily.   finasteride  5 MG tablet Commonly known as: PROSCAR  Take 1 tablet (5 mg total) by mouth daily. What changed: when to take this   fluticasone -salmeterol 250-50 MCG/ACT Aepb Commonly known as: ADVAIR  Inhale 1 puff into the lungs 2 (two) times daily.   food thickener Liqd Commonly known as: SIMPLYTHICK (HONEY/LEVEL 3/MODERATELY THICK) Take 1 packet by mouth as needed.   furosemide  40 MG tablet Commonly known as: LASIX  Take 40 mg by mouth at bedtime.   HYDROcodone -acetaminophen  5-325 MG tablet Commonly known as: NORCO/VICODIN Take 1-2 tablets by mouth every 6 (six) hours as needed for moderate pain (pain score 4-6).   Jardiance  25 MG Tabs tablet Generic drug: empagliflozin  Take 1 tablet (25 mg total) by mouth every morning.   levothyroxine  75 MCG tablet Commonly known as: SYNTHROID  Take 1 tablet (75 mcg total) by mouth daily.   midodrine  10 MG tablet Commonly known as: PROAMATINE  Take 1 tablet (10 mg total) by mouth 3 (three) times  daily with meals.   multivitamin with minerals Tabs tablet Take 1 tablet by mouth daily. Start taking on: August 03, 2024    mupirocin  ointment 2 % Commonly known as: BACTROBAN  Apply 1 Application topically 2 (two) times daily. What changed: when to take this   oxybutynin  5 MG tablet Commonly known as: DITROPAN  Take 1 tablet (5 mg total) by mouth 2 (two) times daily. What changed: when to take this   pantoprazole  40 MG tablet Commonly known as: PROTONIX  Take 1 tablet (40 mg total) by mouth 2 (two) times daily - 1/2-1 hour before meals   rosuvastatin  40 MG tablet Commonly known as: CRESTOR  Take 1 tablet (40 mg total) by mouth every evening after a meal   senna 8.6 MG Tabs tablet Commonly known as: SENOKOT Take 2 tablets (17.2 mg total) by mouth at bedtime for 5 days.   Trulicity  1.5 MG/0.5ML Soaj Generic drug: Dulaglutide  Inject 1.5 mg as directed once a week. What changed: Another medication with the same name was removed. Continue taking this medication, and follow the directions you see here.   Vitamin D  (Ergocalciferol ) 1.25 MG (50000 UNIT) Caps capsule Commonly known as: DRISDOL  Take 1 capsule (50,000 Units total) by mouth every 7 (seven) days for 3 doses. Start taking on: August 05, 2024        Follow-up Information     Celena Sharper, MD. Schedule an appointment as soon as possible for a visit in 2 week(s).   Specialty: Orthopedic Surgery Contact information: [address] [place] [address] [phone]                Discharge Exam: BP (!) 107/56 (BP Location: Right Arm)   Pulse (!) 104   Temp 98.2 F (36.8 C) (Oral)   Resp 18   Ht 6' (1.829 m)   Wt 74.8 kg   SpO2 96%   BMI 22.37 kg/m   General exam: Appears calm and comfortable. Respiratory system: Clear to auscultation. Respiratory effort normal. Cardiovascular system: S1 & S2 heard, irregular rhythm, normal rate.  Gastrointestinal system: Abdomen is nondistended, soft and nontender. Normal bowel sounds heard. Central nervous system: Alert. Musculoskeletal: No edema. No calf tenderness. Incision area  without significant ecchymosis.  Condition at discharge: stable  The results of significant diagnostics from this hospitalization (including imaging, microbiology, ancillary and laboratory) are listed below for reference.   Imaging Studies: DG Swallowing Func-Speech Pathology Result Date: 07/31/2024 Table formatting from the original result was not included. Modified Barium Swallow Study Patient Details Name: Timothy Mclean MRN: [id number] Date of Birth: [birth year] Today's Date: 07/31/2024 HPI/PMH: HPI: Patient is an 84 yo male presenting to the ED status post fall on 07/26/24.  Found to have R intertrochanteric femoral fracture, R IM nail completed on 1/8. SLP ordered 1/12 for swallow eval due to concern for PNA (R mid to lower lung field per CXR). Pt had a previous MBS in 2022 after having COVID with pharyngeal residue as well as penetration and aspiration (PAS 7, 8) with thin and nectar thick liquids. Dys 3 diet/honey thick liquids by cup recommended. PMH includes: dementia, Barrett's esophagus, COPD, GERD, hearing loss, CAD s/p CABG, HTN, chronic A. fib, DMII, asthma, hypothyroidism, CKD stage II Clinical Impression: Pt presents with an oropharyngeal dysphagia as well as suspected underlying esophageal component. He exhibits reduced airway protection (silent aspiration) and efficiency. He is further impacted by posture and benefits from a slightly reclined posture, so that his neck and pharynx are in  a more vertical position for intake. With this positioning, still recommend Dys 2 (finely chopped) diet and honey thick liquids. Pt has oral deficits that include a lot of repetitive lingual motion to attempt posterior transit, leaving collections of residue diffusely throughout his oral cavity. No overt anterior loss was noted though, and mastication seems to be fairly appropriate. Pharyngeally he has reduced base of tongue retraction, pharyngeal squeeze, and hyolaryngeal movement. His epiglottis has very little  movement backwards, and no downward inversion. He has incomplete airway protection but also incomplete pharyngeal clearance, leaving collections of residue diffusely in his pharynx as well. When also factoring in his kyphotic posture, with pharynx almost completely horizontal, his positioning further allows for barium to enter his airway during the swallow and after. Aspiration is observed with thin and nectar thick liquids, but also suspected with initial sips of honey thick liquids (PAS 8, silent aspiration). This is difficult to differentiate given presence of persistent barium from thinner consistencies, but once he could cough and clear his laryngeal vestibule further, there were no other instances of aspiration with honey thick liquids or solids. Pt did his relative best when positioned slightly reclined, so that his pharynx was in a more vertical position. Even in this position there is trace penetration of honey thick liquids and purees, but a cued cough is effective at clearing his laryngeal vestibule. Note that his esophagus had retained barium throughout the distal portion upon brief esophageal sweep. Factors that may increase risk of adverse event in presence of aspiration Noe & Lianne 2021): Factors that may increase risk of adverse event in presence of aspiration Noe & Lianne 2021): Respiratory or GI disease; Reduced cognitive function; Limited mobility; Frail or deconditioned; Dependence for feeding and/or oral hygiene; Aspiration of thick, dense, and/or acidic materials Recommendations/Plan: Swallowing Evaluation Recommendations Swallowing Evaluation Recommendations Recommendations: PO diet PO Diet Recommendation: Dysphagia 2 (Finely chopped); Moderately thick liquids (Level 3, honey thick) Liquid Administration via: Straw; Cup Medication Administration: Crushed with puree Supervision: Staff to assist with self-feeding; Full supervision/cueing for swallowing strategies Swallowing strategies   : Slow rate; Small bites/sips; Hard cough after swallowing Postural changes: Stay upright 30-60 min after meals; Partially reclined for meals (partially recline pt until his neck is in a vertical position) Oral care recommendations: Oral care BID (2x/day) Caregiver Recommendations: Avoid jello, ice cream, thin soups, popsicles; Remove water pitcher Treatment Plan Treatment Plan Treatment recommendations: Therapy as outlined in treatment plan below Follow-up recommendations: Skilled nursing-short term rehab (<3 hours/day) Functional status assessment: Patient has had a recent decline in their functional status and demonstrates the ability to make significant improvements in function in a reasonable and predictable amount of time. Treatment frequency: Min 2x/week Treatment duration: 2 weeks Interventions: Aspiration precaution training; Oropharyngeal exercises; Compensatory techniques; Patient/family education; Trials of upgraded texture/liquids; Diet toleration management by SLP; Respiratory muscle strength training Recommendations Recommendations for follow up therapy are one component of a multi-disciplinary discharge planning process, led by the attending physician.  Recommendations may be updated based on patient status, additional functional criteria and insurance authorization. Assessment: Orofacial Exam: Orofacial Exam Oral Cavity: Oral Hygiene: WFL Oral Cavity - Dentition: Edentulous Anatomy: Anatomy: Other (Comment) (pt with kyphotic posture with pharynx horizontal at baseline) Boluses Administered: Boluses Administered Boluses Administered: Thin liquids (Level 0); Mildly thick liquids (Level 2, nectar thick); Moderately thick liquids (Level 3, honey thick); Puree; Solid  Oral Impairment Domain: Oral Impairment Domain Lip Closure: -- (difficult to visualize lips in frame but no overt anterior loss)  Tongue control during bolus hold: Not tested Bolus preparation/mastication: Timely and efficient chewing and  mashing Bolus transport/lingual motion: Repetitive/disorganized tongue motion Oral residue: Residue collection on oral structures Location of oral residue : Floor of mouth; Lateral sulci; Tongue Initiation of pharyngeal swallow : Pyriform sinuses  Pharyngeal Impairment Domain: Pharyngeal Impairment Domain Soft palate elevation: No bolus between soft palate (SP)/pharyngeal wall (PW) Laryngeal elevation: Partial superior movement of thyroid  cartilage/partial approximation of arytenoids to epiglottic petiole Anterior hyoid excursion: Partial anterior movement Epiglottic movement: Partial inversion Laryngeal vestibule closure: Incomplete, narrow column air/contrast in laryngeal vestibule Pharyngeal stripping wave : Present - diminished Pharyngeal contraction (A/P view only): N/A Pharyngoesophageal segment opening: Partial distention/partial duration, partial obstruction of flow Tongue base retraction: Narrow column of contrast or air between tongue base and PPW Pharyngeal residue: Collection of residue within or on pharyngeal structures Location of pharyngeal residue: Valleculae; Pyriform sinuses; Aryepiglottic folds; Pharyngeal wall; Diffuse (>3 areas)  Esophageal Impairment Domain: Esophageal Impairment Domain Esophageal clearance upright position: Esophageal retention Pill: Pill Consistency administered: -- (deferred) Penetration/Aspiration Scale Score: Penetration/Aspiration Scale Score 1.  Material does not enter airway: Solid 3.  Material enters airway, remains ABOVE vocal cords and not ejected out: Puree 8.  Material enters airway, passes BELOW cords without attempt by patient to eject out (silent aspiration) : Thin liquids (Level 0); Mildly thick liquids (Level 2, nectar thick); Moderately thick liquids (Level 3, honey thick) Compensatory Strategies: Compensatory Strategies Compensatory strategies: Yes Reclining posture: Effective; Ineffective Effective Reclining Posture: Moderately thick liquid (Level 3, honey  thick); Puree; Solid Ineffective Reclining Posture: Mildly thick liquid (Level 2, nectar thick)   General Information: Caregiver present: No  Diet Prior to this Study: Regular; Thin liquids (Level 0)   Temperature : Normal   Respiratory Status: WFL   Supplemental O2: None (Room air)   History of Recent Intubation: Yes  Behavior/Cognition: Alert; Cooperative; Pleasant mood; Other (Comment) (HOH, does not have hearing aid here) Self-Feeding Abilities: Needs assist with self-feeding Baseline vocal quality/speech: Normal Volitional Cough: Able to elicit Volitional Swallow: Able to elicit Exam Limitations: Poor positioning; Limited visibility Goal Planning: Prognosis for improved oropharyngeal function: Good Barriers to Reach Goals: Cognitive deficits No data recorded Patient/Family Stated Goal: none stated Consulted and agree with results and recommendations: Patient Pain: Pain Assessment Pain Assessment: Faces Faces Pain Scale: 0 Breathing: 1 Negative Vocalization: 2 Facial Expression: 2 Body Language: 0 Consolability: 1 PAINAD Score: 6 End of Session: Start Time:SLP Start Time (ACUTE ONLY): 1505 Stop Time: SLP Stop Time (ACUTE ONLY): 1524 Time Calculation:SLP Time Calculation (min) (ACUTE ONLY): 19 min Charges: SLP Evaluations $ SLP Speech Visit: 1 Visit SLP Evaluations $BSS Swallow: 1 Procedure $MBS Swallow: 1 Procedure SLP visit diagnosis: SLP Visit Diagnosis: Dysphagia, oropharyngeal phase (R13.12) Past Medical History: Past Medical History: Diagnosis Date  Allergy   Anxiety   Asthma   since 2013  Barrett's esophagus 03/09/2014  Bilateral lumbar radiculopathy   since 2013  CAD S/P percutaneous coronary angioplasty 1987  a. 1987 angioplasty and BMS- Cx; b. 1997 CABG x 4;  c. MYOVIEW  1/'05: ? Ischemia vs. artifact --> sent for cath - patent grafts.  Chronic atrial fibrillation (HCC)   a. Prev on amiodarone -->d/c 08/2016 in setting of recurrent AF, LFT abnormalities, and pulmonary symptoms;  b. CHA2DS2VASc =  4-->eliquis .  Chronic bronchitis (HCC)   since 2007  COPD (chronic obstructive pulmonary disease) (HCC)   DDD (degenerative disc disease)   Diverticulosis   Dyslipidemia, goal LDL below 70   Doing  better off of Zocor, currently on Lescol   ED (erectile dysfunction)   Elevated LFTs   Esophageal reflux   GERD (gastroesophageal reflux disease)   Gout, unspecified   Hearing loss   Helicobacter pylori gastritis 07/2009  Herpes zoster 2009  Hypothyroidism   since 2010  Iron deficiency anemia, unspecified   Leg weakness   a. 09/2016 ABI's: R 1.05, L 0.98.  Other and unspecified hyperlipidemia   Other B-complex deficiencies   Personal history of colonic polyps   Prostatitis   chronic ulcerative  Pulmonary fibrosis (HCC)   and bronchiectasis, since 2015  Renal stone   Right  S/P CABG x 4 1997  a) LIMA-LAD, SVG to OM, SVG-dRCA-RPL; b) FALSE + MYOVIEW  --> CATH 1/'05: 100% LAD after widelly patent D1, all Cx OM branches 100%, pRCA 100%, SVG-PDA patent w/ retrograde filling of RPL, SVG-OM widely patent ~ normal OM, LIMA-LAD patent.  Spinal stenosis   ST elevation myocardial infarction (STEMI) of inferior wall, subsequent episode of care (HCC) 1987, 1997  a) PTCA of Cx; b) CABG   Type II or unspecified type diabetes mellitus without mention of complication, not stated as uncontrolled   no meds  Ulcerative colitis, unspecified   Unspecified essential hypertension   Vitamin D  deficiency  Past Surgical History: Past Surgical History: Procedure Laterality Date  BLEPHAROPLASTY Bilateral   for ptosis  CARDIAC CATHETERIZATION  08/17/2003  False positive stress test  grafts patent; RCA proximal 100% LAD 100% occlusion after normal D1 with 80% ostial as SP1; circumflex patent but OM1 on to all occluded; EF 50-55%  CARDIAC CATHETERIZATION  11/1995  Preop CABG: LAD 90% at D1, circumflex 100 and OM a 90% proximal, 80% distal  Carotid Duplex Doppler  12/23/2009  Right&Left ICAs 0-49%, mildly abnormal study,   CHOLECYSTECTOMY    COLONOSCOPY     CORONARY ARTERY BYPASS GRAFT  11/1995  LIMA-LAD, SVG to OM, SVG-dRCA-RPL  ESOPHAGOGASTRODUODENOSCOPY    FLEXIBLE SIGMOIDOSCOPY    INTRAMEDULLARY (IM) NAIL INTERTROCHANTERIC Right 07/27/2024  Procedure: FIXATION, FRACTURE, INTERTROCHANTERIC, WITH INTRAMEDULLARY ROD RIGHT HIP;  Surgeon: Celena Sharper, MD;  Location: [facility] OR;  Service: Orthopedics;  Laterality: Right;  NM MYOCAR PERF EJECTION FRACTION  07/03/2003  FALSE POSITIVE TEST; Bruce protocol, negative test with scintigraphic evidence of inferoapical scar, diaphragmatic attenuation, moderate ischemia.  NM MYOVIEW  LTD  2005  Questionable ischemia that is likely either infarct versus artifact; normal  TRANSTHORACIC ECHOCARDIOGRAM  12/2017  EF 55-60%.  Unable to assess diastolic function due to A. fib.  Moderate LA/RA dilation.  Mild MR. Leita SAILOR., M.A. CCC-SLP Acute Rehabilitation Services Office: [phone] Secure chat preferred 07/31/2024, 4:27 PM  DG CHEST PORT 1 VIEW Result Date: 07/30/2024 CLINICAL DATA:  Cough. EXAM: PORTABLE CHEST 1 VIEW COMPARISON:  07/26/2024, 01/16/2014. FINDINGS: The heart size and mediastinal contours are within normal limits. There is atherosclerotic calcification of the aorta. Emphysematous changes and interstitial prominence is present bilaterally. Airspace disease is noted in the mid to lower lung field on the right. Mild atelectasis is noted at the left lung base. There is a small right pleural effusion. No pneumothorax is seen. No acute osseous abnormality. IMPRESSION: 1. Airspace disease in the mid to lower lung field on the right, suspicious for pneumonia. 2. Small right pleural effusion. 3. Emphysematous changes. Electronically Signed   By: Leita Birmingham M.D.   On: 07/30/2024 18:20   DG FEMUR PORT, MIN 2 VIEWS RIGHT Result Date: 07/27/2024 CLINICAL DATA:  Operative fixation of a right intertrochanteric fracture.  EXAM: RIGHT FEMUR PORTABLE 2 VIEW COMPARISON:  C-arm images obtained earlier today and pelvis radiographs and CT  dated 07/26/2024. FINDINGS: Intramedullary rod and screw fixation of the previously demonstrated right intertrochanteric fracture. There is valgus angulation as well as displacement of the greater and lesser trochanter fragments. Mild right hip degenerative changes. No new fractures or dislocation seen. Diffuse osteopenia. Extensive atheromatous arterial calcifications. Previously noted right knee degenerative changes. IMPRESSION: Intramedullary rod and screw fixation of the previously demonstrated right intertrochanteric fracture with valgus angulation and displacement of the greater and lesser trochanter fragments. Electronically Signed   By: Elspeth Bathe M.D.   On: 07/27/2024 14:06   DG FEMUR, MIN 2 VIEWS RIGHT Result Date: 07/27/2024 CLINICAL DATA:  Operative fixation of a right intertrochanteric fracture. EXAM: RIGHT FEMUR 2 VIEWS COMPARISON:  Right knee dated 07/26/2024. Portable pelvis radiographs dated 07/26/2024. FINDINGS: Eight C-arm images of the right femur demonstrate intramedullary rod and screw fixation of the previously demonstrated intertrochanteric fracture. Mild residual fracture displacement. IMPRESSION: Internal fixation of the previously demonstrated right intertrochanteric fracture. Electronically Signed   By: Elspeth Bathe M.D.   On: 07/27/2024 14:03   DG C-Arm 1-60 Min-No Report Result Date: 07/27/2024 Fluoroscopy was utilized by the requesting physician.  No radiographic interpretation.   DG C-Arm 1-60 Min-No Report Result Date: 07/27/2024 Fluoroscopy was utilized by the requesting physician.  No radiographic interpretation.   DG Knee Right Port Result Date: 07/26/2024 EXAM: 1 or 2 VIEW(S) XRAY OF THE RIGHT KNEE 07/26/2024 10:17:57 PM COMPARISON: 08/03/2011 CLINICAL HISTORY: [id number] Closed fracture of shaft of right femur, unspecified fracture morphology, initial encounter [facility]) [id number] FINDINGS: BONES AND JOINTS: No acute fracture. No malalignment. No significant joint effusion.  Moderate to advanced tricompartmental degenerative changes. SOFT TISSUES: Diffuse vascular calcifications. IMPRESSION: 1. No acute findings. 2. Moderate to advanced tricompartmental degenerative changes. Electronically signed by: Franky Crease MD MD 07/26/2024 10:22 PM EST RP Workstation: [id number]   CT HEAD WO CONTRAST Result Date: 07/26/2024 CLINICAL DATA:  Trauma fall altered EXAM: CT HEAD WITHOUT CONTRAST CT CERVICAL SPINE WITHOUT CONTRAST TECHNIQUE: Multidetector CT imaging of the head and cervical spine was performed following the standard protocol without intravenous contrast. Multiplanar CT image reconstructions of the cervical spine were also generated. RADIATION DOSE REDUCTION: This exam was performed according to the departmental dose-optimization program which includes automated exposure control, adjustment of the mA and/or kV according to patient size and/or use of iterative reconstruction technique. COMPARISON:  CT brain 12/08/2023 FINDINGS: CT HEAD FINDINGS Brain: No acute territorial infarction, hemorrhage or intracranial mass. Atrophy and chronic small vessel ischemic changes of the white matter. Small left chronic parietooccipital infarct. The ventricles are non enlarged. Vascular: No hyperdense vessels. Vertebral and carotid vascular calcification Skull: None no fracture Sinuses/Orbits: No acute finding. Other: None CT CERVICAL SPINE FINDINGS Alignment: Trace anterolisthesis C4 on C5, C5 on C6 and C7 on T1. Facet alignment is normal Skull base and vertebrae: No acute fracture. No primary bone lesion or focal pathologic process. Soft tissues and spinal canal: No prevertebral fluid or swelling. No visible canal hematoma. Disc levels: Multilevel degenerative changes. Advanced disc space narrowing C5-C6 and C6-C7 with moderate disc space narrowing at C7-T1. Multilevel hypertrophic facet degenerative changes with foraminal narrowing. Upper chest: Apical emphysema Other: None IMPRESSION: 1. No CT  evidence for acute intracranial abnormality. Atrophy and chronic small vessel ischemic changes of the white matter. 2. Multilevel degenerative changes of the cervical spine. No acute osseous abnormality. Electronically Signed   By: Luke  Scott M.D.   On: 07/26/2024 16:34   CT CERVICAL SPINE WO CONTRAST Result Date: 07/26/2024 CLINICAL DATA:  Trauma fall altered EXAM: CT HEAD WITHOUT CONTRAST CT CERVICAL SPINE WITHOUT CONTRAST TECHNIQUE: Multidetector CT imaging of the head and cervical spine was performed following the standard protocol without intravenous contrast. Multiplanar CT image reconstructions of the cervical spine were also generated. RADIATION DOSE REDUCTION: This exam was performed according to the departmental dose-optimization program which includes automated exposure control, adjustment of the mA and/or kV according to patient size and/or use of iterative reconstruction technique. COMPARISON:  CT brain 12/08/2023 FINDINGS: CT HEAD FINDINGS Brain: No acute territorial infarction, hemorrhage or intracranial mass. Atrophy and chronic small vessel ischemic changes of the white matter. Small left chronic parietooccipital infarct. The ventricles are non enlarged. Vascular: No hyperdense vessels. Vertebral and carotid vascular calcification Skull: None no fracture Sinuses/Orbits: No acute finding. Other: None CT CERVICAL SPINE FINDINGS Alignment: Trace anterolisthesis C4 on C5, C5 on C6 and C7 on T1. Facet alignment is normal Skull base and vertebrae: No acute fracture. No primary bone lesion or focal pathologic process. Soft tissues and spinal canal: No prevertebral fluid or swelling. No visible canal hematoma. Disc levels: Multilevel degenerative changes. Advanced disc space narrowing C5-C6 and C6-C7 with moderate disc space narrowing at C7-T1. Multilevel hypertrophic facet degenerative changes with foraminal narrowing. Upper chest: Apical emphysema Other: None IMPRESSION: 1. No CT evidence for acute  intracranial abnormality. Atrophy and chronic small vessel ischemic changes of the white matter. 2. Multilevel degenerative changes of the cervical spine. No acute osseous abnormality. Electronically Signed   By: Luke Scott M.D.   On: 07/26/2024 16:34   CT ABDOMEN PELVIS W CONTRAST Result Date: 07/26/2024 EXAM: CT ABDOMEN AND PELVIS WITH CONTRAST 07/26/2024 04:11:00 PM TECHNIQUE: CT of the abdomen and pelvis was performed with the administration of 75 mL of iohexol  (OMNIPAQUE ) 350 MG/ML injection. Multiplanar reformatted images are provided for review. Automated exposure control, iterative reconstruction, and/or weight-based adjustment of the mA/kV was utilized to reduce the radiation dose to as low as reasonably achievable. COMPARISON: CT abdomen and pelvis 01/32/2018. CLINICAL HISTORY: Polytrauma, blunt. FINDINGS: LOWER CHEST: No acute abnormality. LIVER: The liver is unremarkable. GALLBLADDER AND BILE DUCTS: Gallbladder surgically absent. No biliary ductal dilatation. SPLEEN: No acute abnormality. PANCREAS: No acute abnormality. ADRENAL GLANDS: No acute abnormality. KIDNEYS, URETERS AND BLADDER: Moderate bilateral hydroureteronephrosis to the level of the bladder without obstructing calculus. The bladder is markedly distended. There are cysts of the right kidney measuring up to 2.2 cm. Per consensus, no follow-up is needed for simple Bosniak type 1 and 2 renal cysts, unless the patient has a malignancy history or risk factors. No perinephric or periureteral stranding. GI AND BOWEL: Stomach demonstrates no acute abnormality. There is a large amount of stool throughout the colon. The appendix is not visualized. There is no bowel obstruction. PERITONEUM AND RETROPERITONEUM: No ascites. No free air. VASCULATURE: Aorta is normal in caliber. There are atherosclerotic calcifications of the aorta and iliac arteries. LYMPH NODES: No lymphadenopathy. REPRODUCTIVE ORGANS: No acute abnormality. BONES AND SOFT TISSUES:  The bones are diffusely osteopenic. There is a comminuted right femoral intertrochanteric fracture and proximal diaphyseal fracture which appears nondisplaced. No focal soft tissue abnormality. IMPRESSION: 1. Comminuted nondisplaced right femoral intertrochanteric fracture with associated proximal diaphyseal fracture. 2. Markedly distended bladder with moderate bilateral hydroureteronephrosis, likely related to urinary retention. 3. Large colonic stool burden. 4. Diffuse osteopenia. Electronically signed by: Greig Pique MD MD 07/26/2024 04:31 PM  EST RP Workstation: [id number]   DG Chest Portable 1 View Result Date: 07/26/2024 CLINICAL DATA:  Trauma EXAM: PORTABLE CHEST 1 VIEW COMPARISON:  12/08/2023 FINDINGS: Post sternotomy changes. Emphysema and bronchitic changes. No acute airspace disease, pleural effusion or pneumothorax. Stable cardiomediastinal silhouette with aortic atherosclerosis. IMPRESSION: No active disease. Emphysema and bronchitic changes. Electronically Signed   By: Luke Bun M.D.   On: 07/26/2024 15:49   DG Forearm Left Result Date: 07/26/2024 CLINICAL DATA:  Mechanical fall EXAM: LEFT FOREARM - 2 VIEW COMPARISON:  None Available. FINDINGS: No fracture or malalignment.  No significant elbow effusion IMPRESSION: No acute osseous abnormality. Electronically Signed   By: Luke Bun M.D.   On: 07/26/2024 15:49   DG Pelvis Portable Result Date: 07/26/2024 CLINICAL DATA:  Hip pain post fall EXAM: PORTABLE PELVIS 1-2 VIEWS COMPARISON:  08/24/2021 FINDINGS: SI joints are non widened. Pubic symphysis and rami appear intact. Femoral heads project in joint. Mild bilateral hip degenerative changes. Acute minimally displaced right intertrochanteric proximal femur fracture. IMPRESSION: Acute minimally displaced right intertrochanteric proximal femur fracture. Electronically Signed   By: Luke Bun M.D.   On: 07/26/2024 15:48    Microbiology: Results for orders placed or performed during the  hospital encounter of 07/26/24  Surgical PCR screen     Status: None   Collection Time: 07/27/24 12:56 AM   Specimen: Nasal Mucosa; Nasal Swab  Result Value Ref Range Status   MRSA, PCR NEGATIVE NEGATIVE Final   Staphylococcus aureus NEGATIVE NEGATIVE Final    Comment: (NOTE) The Xpert SA Assay (FDA approved for NASAL specimens in patients 69 years of age and older), is one component of a comprehensive surveillance program. It is not intended to diagnose infection nor to guide or monitor treatment. Performed at [facility], [address] [place], [place] [address]     Labs: CBC: Recent Labs  Lab 07/28/24 [phone] 07/29/24 0605 07/31/24 0644 08/01/24 1253 08/02/24 0508  WBC 12.0* 12.6* 16.1* 10.9* 11.8*  NEUTROABS  --   --  14.1* 9.5* 9.9*  HGB 10.7* 9.9* 10.7* 9.2* 9.9*  HCT 31.7* 29.7* 31.6* 26.9* 28.9*  MCV 89.3 90.8 89.0 88.8 89.2  PLT 156 158 231 223 264   Basic Metabolic Panel: Recent Labs  Lab 07/28/24 0611 07/29/24 0605 07/31/24 0644 08/01/24 1253 08/02/24 0508  NA 134* 131* 132* 130* 136  K 4.0 3.9 4.0 3.4* 4.1  CL 100 100 98 98 102  CO2 22 21* 24 22 23   GLUCOSE 166* 167* 196* 283* 186*  BUN 34* 39* 37* 38* 36*  CREATININE 1.61* 1.46* 1.31* 1.24 1.14  CALCIUM  8.7* 7.8* 8.6* 8.2* 8.4*   Liver Function Tests: Recent Labs  Lab 07/26/24 1512  AST 45*  ALT 23  ALKPHOS 122  BILITOT 0.5  PROT 6.8  ALBUMIN  3.8   CBG: Recent Labs  Lab 08/01/24 1642 08/01/24 2108 08/02/24 0548 08/02/24 0822 08/02/24 1146  GLUCAP 266* 187* 201* 179* 208*    Discharge time spent: 35 minutes.  Signed: Elgin Lam, MD [facility] 08/02/2024 "

## 2024-08-02 NOTE — Hospital Course (Signed)
 Timothy Mclean is a 84 y.o. male with a history of dementia, asthma, COPD, atrial fibrillation, CAD s/p STEMI and CABG, CKD stage IIIa, diabetes mellitus type 2, GERD.  Patient presented secondary to right hip pain after a fall and fond to have a right hip fracture. Orthopedic surgery consulted and performed an intramedullary nail placement. PT and OT recommendation for discharge to SNF. Hospitalization complicated by dysphagia and aspiration pneumonia, requiring antibiotics, in addition to bilateral hydroureteronephrosis, requiring foley catheter placement.

## 2024-08-07 ENCOUNTER — Emergency Department

## 2024-08-07 ENCOUNTER — Other Ambulatory Visit: Payer: Self-pay

## 2024-08-07 ENCOUNTER — Inpatient Hospital Stay
Admission: EM | Admit: 2024-08-07 | Discharge: 2024-08-20 | DRG: 871 | Disposition: E | Source: Skilled Nursing Facility | Attending: Family Medicine | Admitting: Family Medicine

## 2024-08-07 DIAGNOSIS — W19XXXA Unspecified fall, initial encounter: Secondary | ICD-10-CM | POA: Diagnosis present

## 2024-08-07 DIAGNOSIS — S51812A Laceration without foreign body of left forearm, initial encounter: Secondary | ICD-10-CM | POA: Diagnosis present

## 2024-08-07 DIAGNOSIS — Z7189 Other specified counseling: Secondary | ICD-10-CM | POA: Diagnosis not present

## 2024-08-07 DIAGNOSIS — R6520 Severe sepsis without septic shock: Secondary | ICD-10-CM

## 2024-08-07 DIAGNOSIS — E785 Hyperlipidemia, unspecified: Secondary | ICD-10-CM | POA: Diagnosis not present

## 2024-08-07 DIAGNOSIS — J440 Chronic obstructive pulmonary disease with acute lower respiratory infection: Secondary | ICD-10-CM | POA: Diagnosis present

## 2024-08-07 DIAGNOSIS — K5190 Ulcerative colitis, unspecified, without complications: Secondary | ICD-10-CM | POA: Diagnosis not present

## 2024-08-07 DIAGNOSIS — E1122 Type 2 diabetes mellitus with diabetic chronic kidney disease: Secondary | ICD-10-CM | POA: Diagnosis present

## 2024-08-07 DIAGNOSIS — Z860100 Personal history of colon polyps, unspecified: Secondary | ICD-10-CM

## 2024-08-07 DIAGNOSIS — Z79890 Hormone replacement therapy (postmenopausal): Secondary | ICD-10-CM

## 2024-08-07 DIAGNOSIS — N400 Enlarged prostate without lower urinary tract symptoms: Secondary | ICD-10-CM | POA: Diagnosis present

## 2024-08-07 DIAGNOSIS — J439 Emphysema, unspecified: Secondary | ICD-10-CM | POA: Diagnosis present

## 2024-08-07 DIAGNOSIS — A419 Sepsis, unspecified organism: Secondary | ICD-10-CM | POA: Diagnosis present

## 2024-08-07 DIAGNOSIS — Z515 Encounter for palliative care: Secondary | ICD-10-CM

## 2024-08-07 DIAGNOSIS — G9341 Metabolic encephalopathy: Secondary | ICD-10-CM | POA: Diagnosis present

## 2024-08-07 DIAGNOSIS — Z66 Do not resuscitate: Secondary | ICD-10-CM | POA: Diagnosis present

## 2024-08-07 DIAGNOSIS — J690 Pneumonitis due to inhalation of food and vomit: Secondary | ICD-10-CM | POA: Diagnosis present

## 2024-08-07 DIAGNOSIS — E039 Hypothyroidism, unspecified: Secondary | ICD-10-CM | POA: Diagnosis present

## 2024-08-07 DIAGNOSIS — N179 Acute kidney failure, unspecified: Secondary | ICD-10-CM | POA: Diagnosis present

## 2024-08-07 DIAGNOSIS — J189 Pneumonia, unspecified organism: Secondary | ICD-10-CM | POA: Diagnosis present

## 2024-08-07 DIAGNOSIS — J9601 Acute respiratory failure with hypoxia: Secondary | ICD-10-CM | POA: Diagnosis present

## 2024-08-07 DIAGNOSIS — Z1152 Encounter for screening for COVID-19: Secondary | ICD-10-CM

## 2024-08-07 DIAGNOSIS — E878 Other disorders of electrolyte and fluid balance, not elsewhere classified: Secondary | ICD-10-CM | POA: Diagnosis present

## 2024-08-07 DIAGNOSIS — Z87891 Personal history of nicotine dependence: Secondary | ICD-10-CM

## 2024-08-07 DIAGNOSIS — Z7984 Long term (current) use of oral hypoglycemic drugs: Secondary | ICD-10-CM

## 2024-08-07 DIAGNOSIS — Z7951 Long term (current) use of inhaled steroids: Secondary | ICD-10-CM

## 2024-08-07 DIAGNOSIS — R64 Cachexia: Secondary | ICD-10-CM | POA: Diagnosis present

## 2024-08-07 DIAGNOSIS — I21A1 Myocardial infarction type 2: Secondary | ICD-10-CM | POA: Diagnosis present

## 2024-08-07 DIAGNOSIS — Z951 Presence of aortocoronary bypass graft: Secondary | ICD-10-CM

## 2024-08-07 DIAGNOSIS — I10 Essential (primary) hypertension: Secondary | ICD-10-CM | POA: Diagnosis present

## 2024-08-07 DIAGNOSIS — J90 Pleural effusion, not elsewhere classified: Secondary | ICD-10-CM | POA: Diagnosis present

## 2024-08-07 DIAGNOSIS — I2510 Atherosclerotic heart disease of native coronary artery without angina pectoris: Secondary | ICD-10-CM | POA: Diagnosis present

## 2024-08-07 DIAGNOSIS — E876 Hypokalemia: Secondary | ICD-10-CM | POA: Diagnosis present

## 2024-08-07 DIAGNOSIS — Z820 Family history of epilepsy and other diseases of the nervous system: Secondary | ICD-10-CM

## 2024-08-07 DIAGNOSIS — J8410 Pulmonary fibrosis, unspecified: Secondary | ICD-10-CM | POA: Diagnosis present

## 2024-08-07 DIAGNOSIS — I951 Orthostatic hypotension: Secondary | ICD-10-CM | POA: Diagnosis present

## 2024-08-07 DIAGNOSIS — L98429 Non-pressure chronic ulcer of back with unspecified severity: Secondary | ICD-10-CM | POA: Diagnosis present

## 2024-08-07 DIAGNOSIS — M6282 Rhabdomyolysis: Secondary | ICD-10-CM | POA: Diagnosis present

## 2024-08-07 DIAGNOSIS — N390 Urinary tract infection, site not specified: Secondary | ICD-10-CM | POA: Diagnosis present

## 2024-08-07 DIAGNOSIS — Z888 Allergy status to other drugs, medicaments and biological substances status: Secondary | ICD-10-CM

## 2024-08-07 DIAGNOSIS — H9190 Unspecified hearing loss, unspecified ear: Secondary | ICD-10-CM | POA: Diagnosis present

## 2024-08-07 DIAGNOSIS — N1831 Chronic kidney disease, stage 3a: Secondary | ICD-10-CM | POA: Diagnosis present

## 2024-08-07 DIAGNOSIS — I130 Hypertensive heart and chronic kidney disease with heart failure and stage 1 through stage 4 chronic kidney disease, or unspecified chronic kidney disease: Secondary | ICD-10-CM | POA: Diagnosis present

## 2024-08-07 DIAGNOSIS — E1110 Type 2 diabetes mellitus with ketoacidosis without coma: Secondary | ICD-10-CM | POA: Diagnosis present

## 2024-08-07 DIAGNOSIS — I4821 Permanent atrial fibrillation: Secondary | ICD-10-CM | POA: Diagnosis present

## 2024-08-07 DIAGNOSIS — Z955 Presence of coronary angioplasty implant and graft: Secondary | ICD-10-CM

## 2024-08-07 DIAGNOSIS — F03C11 Unspecified dementia, severe, with agitation: Secondary | ICD-10-CM | POA: Diagnosis present

## 2024-08-07 DIAGNOSIS — S90822A Blister (nonthermal), left foot, initial encounter: Secondary | ICD-10-CM | POA: Diagnosis present

## 2024-08-07 DIAGNOSIS — R6521 Severe sepsis with septic shock: Secondary | ICD-10-CM | POA: Diagnosis present

## 2024-08-07 DIAGNOSIS — Z801 Family history of malignant neoplasm of trachea, bronchus and lung: Secondary | ICD-10-CM

## 2024-08-07 DIAGNOSIS — Z7901 Long term (current) use of anticoagulants: Secondary | ICD-10-CM | POA: Diagnosis not present

## 2024-08-07 DIAGNOSIS — Z823 Family history of stroke: Secondary | ICD-10-CM

## 2024-08-07 DIAGNOSIS — Z825 Family history of asthma and other chronic lower respiratory diseases: Secondary | ICD-10-CM

## 2024-08-07 DIAGNOSIS — I509 Heart failure, unspecified: Secondary | ICD-10-CM | POA: Diagnosis present

## 2024-08-07 DIAGNOSIS — Z8249 Family history of ischemic heart disease and other diseases of the circulatory system: Secondary | ICD-10-CM

## 2024-08-07 DIAGNOSIS — R0603 Acute respiratory distress: Secondary | ICD-10-CM | POA: Diagnosis not present

## 2024-08-07 DIAGNOSIS — R1310 Dysphagia, unspecified: Secondary | ICD-10-CM | POA: Diagnosis present

## 2024-08-07 DIAGNOSIS — I252 Old myocardial infarction: Secondary | ICD-10-CM

## 2024-08-07 DIAGNOSIS — Z7985 Long-term (current) use of injectable non-insulin antidiabetic drugs: Secondary | ICD-10-CM

## 2024-08-07 DIAGNOSIS — F05 Delirium due to known physiological condition: Secondary | ICD-10-CM | POA: Diagnosis not present

## 2024-08-07 DIAGNOSIS — Z79899 Other long term (current) drug therapy: Secondary | ICD-10-CM

## 2024-08-07 DIAGNOSIS — Z794 Long term (current) use of insulin: Secondary | ICD-10-CM

## 2024-08-07 DIAGNOSIS — Z833 Family history of diabetes mellitus: Secondary | ICD-10-CM

## 2024-08-07 DIAGNOSIS — J432 Centrilobular emphysema: Secondary | ICD-10-CM | POA: Diagnosis present

## 2024-08-07 DIAGNOSIS — E1169 Type 2 diabetes mellitus with other specified complication: Secondary | ICD-10-CM | POA: Diagnosis present

## 2024-08-07 DIAGNOSIS — K219 Gastro-esophageal reflux disease without esophagitis: Secondary | ICD-10-CM | POA: Diagnosis present

## 2024-08-07 DIAGNOSIS — R4182 Altered mental status, unspecified: Principal | ICD-10-CM

## 2024-08-07 DIAGNOSIS — E7849 Other hyperlipidemia: Secondary | ICD-10-CM | POA: Diagnosis present

## 2024-08-07 DIAGNOSIS — M109 Gout, unspecified: Secondary | ICD-10-CM | POA: Diagnosis present

## 2024-08-07 DIAGNOSIS — E870 Hyperosmolality and hypernatremia: Secondary | ICD-10-CM | POA: Diagnosis present

## 2024-08-07 DIAGNOSIS — E119 Type 2 diabetes mellitus without complications: Secondary | ICD-10-CM

## 2024-08-07 DIAGNOSIS — S80821A Blister (nonthermal), right lower leg, initial encounter: Secondary | ICD-10-CM | POA: Diagnosis present

## 2024-08-07 DIAGNOSIS — E860 Dehydration: Secondary | ICD-10-CM | POA: Diagnosis present

## 2024-08-07 LAB — CBC WITH DIFFERENTIAL/PLATELET
Abs Immature Granulocytes: 0.32 K/uL — ABNORMAL HIGH (ref 0.00–0.07)
Basophils Absolute: 0.1 K/uL (ref 0.0–0.1)
Basophils Relative: 0 %
Eosinophils Absolute: 0 K/uL (ref 0.0–0.5)
Eosinophils Relative: 0 %
HCT: 33.6 % — ABNORMAL LOW (ref 39.0–52.0)
Hemoglobin: 10.6 g/dL — ABNORMAL LOW (ref 13.0–17.0)
Immature Granulocytes: 1 %
Lymphocytes Relative: 2 %
Lymphs Abs: 0.9 K/uL (ref 0.7–4.0)
MCH: 29.7 pg (ref 26.0–34.0)
MCHC: 31.5 g/dL (ref 30.0–36.0)
MCV: 94.1 fL (ref 80.0–100.0)
Monocytes Absolute: 0.9 K/uL (ref 0.1–1.0)
Monocytes Relative: 3 %
Neutro Abs: 34.9 K/uL — ABNORMAL HIGH (ref 1.7–7.7)
Neutrophils Relative %: 94 %
Platelets: 399 K/uL (ref 150–400)
RBC: 3.57 MIL/uL — ABNORMAL LOW (ref 4.22–5.81)
RDW: 16.9 % — ABNORMAL HIGH (ref 11.5–15.5)
Smear Review: NORMAL
WBC: 37.1 K/uL — ABNORMAL HIGH (ref 4.0–10.5)
nRBC: 0.2 % (ref 0.0–0.2)

## 2024-08-07 LAB — URINALYSIS, W/ REFLEX TO CULTURE (INFECTION SUSPECTED)
Bilirubin Urine: NEGATIVE
Glucose, UA: >=500 mg/dL — AB
Ketones, ur: NEGATIVE mg/dL
Nitrite: NEGATIVE
Protein, ur: NEGATIVE mg/dL
RBC / HPF: 0 RBC/hpf (ref 0–5)
Specific Gravity, Urine: 1.021 (ref 1.005–1.030)
Squamous Epithelial / HPF: 0 /HPF (ref 0–5)
pH: 5 (ref 5.0–8.0)

## 2024-08-07 LAB — RESP PANEL BY RT-PCR (RSV, FLU A&B, COVID)  RVPGX2
Influenza A by PCR: NEGATIVE
Influenza B by PCR: NEGATIVE
Resp Syncytial Virus by PCR: NEGATIVE
SARS Coronavirus 2 by RT PCR: NEGATIVE

## 2024-08-07 LAB — BLOOD GAS, VENOUS
Acid-base deficit: 3.9 mmol/L — ABNORMAL HIGH (ref 0.0–2.0)
Bicarbonate: 22.2 mmol/L (ref 20.0–28.0)
O2 Saturation: 39.8 %
Patient temperature: 37
pCO2, Ven: 43 mmHg — ABNORMAL LOW (ref 44–60)
pH, Ven: 7.32 (ref 7.25–7.43)

## 2024-08-07 LAB — CBG MONITORING, ED
Glucose-Capillary: 132 mg/dL — ABNORMAL HIGH (ref 70–99)
Glucose-Capillary: 150 mg/dL — ABNORMAL HIGH (ref 70–99)
Glucose-Capillary: 160 mg/dL — ABNORMAL HIGH (ref 70–99)
Glucose-Capillary: 167 mg/dL — ABNORMAL HIGH (ref 70–99)
Glucose-Capillary: 169 mg/dL — ABNORMAL HIGH (ref 70–99)
Glucose-Capillary: 261 mg/dL — ABNORMAL HIGH (ref 70–99)
Glucose-Capillary: 271 mg/dL — ABNORMAL HIGH (ref 70–99)
Glucose-Capillary: 364 mg/dL — ABNORMAL HIGH (ref 70–99)
Glucose-Capillary: 421 mg/dL — ABNORMAL HIGH (ref 70–99)
Glucose-Capillary: 423 mg/dL — ABNORMAL HIGH (ref 70–99)

## 2024-08-07 LAB — TROPONIN T, HIGH SENSITIVITY
Troponin T High Sensitivity: 180 ng/L (ref 0–19)
Troponin T High Sensitivity: 150 ng/L (ref 0–19)

## 2024-08-07 LAB — BASIC METABOLIC PANEL WITH GFR
Anion gap: 26 — ABNORMAL HIGH (ref 5–15)
Anion gap: 25 — ABNORMAL HIGH (ref 5–15)
Anion gap: 14 (ref 5–15)
BUN: 57 mg/dL — ABNORMAL HIGH (ref 8–23)
BUN: 66 mg/dL — ABNORMAL HIGH (ref 8–23)
BUN: 68 mg/dL — ABNORMAL HIGH (ref 8–23)
CO2: 15 mmol/L — ABNORMAL LOW (ref 22–32)
CO2: 20 mmol/L — ABNORMAL LOW (ref 22–32)
CO2: 22 mmol/L (ref 22–32)
Calcium: 7.1 mg/dL — ABNORMAL LOW (ref 8.9–10.3)
Calcium: 8 mg/dL — ABNORMAL LOW (ref 8.9–10.3)
Calcium: 8.5 mg/dL — ABNORMAL LOW (ref 8.9–10.3)
Chloride: 99 mmol/L (ref 98–111)
Chloride: 108 mmol/L (ref 98–111)
Chloride: 116 mmol/L — ABNORMAL HIGH (ref 98–111)
Creatinine, Ser: 1.82 mg/dL — ABNORMAL HIGH (ref 0.61–1.24)
Creatinine, Ser: 2.21 mg/dL — ABNORMAL HIGH (ref 0.61–1.24)
Creatinine, Ser: 2.11 mg/dL — ABNORMAL HIGH (ref 0.61–1.24)
GFR, Estimated: 36 mL/min — ABNORMAL LOW
GFR, Estimated: 28 mL/min — ABNORMAL LOW
GFR, Estimated: 30 mL/min — ABNORMAL LOW
Glucose, Bld: 407 mg/dL — ABNORMAL HIGH (ref 70–99)
Glucose, Bld: 465 mg/dL — ABNORMAL HIGH (ref 70–99)
Glucose, Bld: 179 mg/dL — ABNORMAL HIGH (ref 70–99)
Potassium: 2.9 mmol/L — ABNORMAL LOW (ref 3.5–5.1)
Potassium: 3.6 mmol/L (ref 3.5–5.1)
Potassium: 3.4 mmol/L — ABNORMAL LOW (ref 3.5–5.1)
Sodium: 139 mmol/L (ref 135–145)
Sodium: 152 mmol/L — ABNORMAL HIGH (ref 135–145)
Sodium: 152 mmol/L — ABNORMAL HIGH (ref 135–145)

## 2024-08-07 LAB — LIPASE, BLOOD: Lipase: 22 U/L (ref 11–51)

## 2024-08-07 LAB — PRO BRAIN NATRIURETIC PEPTIDE: Pro Brain Natriuretic Peptide: 7198 pg/mL — ABNORMAL HIGH

## 2024-08-07 LAB — LACTIC ACID, PLASMA
Lactic Acid, Venous: 6 mmol/L (ref 0.5–1.9)
Lactic Acid, Venous: 7.3 mmol/L (ref 0.5–1.9)
Lactic Acid, Venous: 3.6 mmol/L (ref 0.5–1.9)

## 2024-08-07 LAB — COMPREHENSIVE METABOLIC PANEL WITH GFR
ALT: 40 U/L (ref 0–44)
AST: 68 U/L — ABNORMAL HIGH (ref 15–41)
Albumin: 2.7 g/dL — ABNORMAL LOW (ref 3.5–5.0)
Alkaline Phosphatase: 170 U/L — ABNORMAL HIGH (ref 38–126)
Anion gap: 20 — ABNORMAL HIGH (ref 5–15)
BUN: 67 mg/dL — ABNORMAL HIGH (ref 8–23)
CO2: 22 mmol/L (ref 22–32)
Calcium: 8.5 mg/dL — ABNORMAL LOW (ref 8.9–10.3)
Chloride: 107 mmol/L (ref 98–111)
Creatinine, Ser: 2.13 mg/dL — ABNORMAL HIGH (ref 0.61–1.24)
GFR, Estimated: 30 mL/min — ABNORMAL LOW
Glucose, Bld: 498 mg/dL — ABNORMAL HIGH (ref 70–99)
Potassium: 2.9 mmol/L — ABNORMAL LOW (ref 3.5–5.1)
Sodium: 149 mmol/L — ABNORMAL HIGH (ref 135–145)
Total Bilirubin: 1.6 mg/dL — ABNORMAL HIGH (ref 0.0–1.2)
Total Protein: 5.1 g/dL — ABNORMAL LOW (ref 6.5–8.1)

## 2024-08-07 LAB — URINE DRUG SCREEN
Amphetamines: NEGATIVE
Barbiturates: NEGATIVE
Benzodiazepines: NEGATIVE
Cocaine: NEGATIVE
Fentanyl: NEGATIVE
Methadone Scn, Ur: NEGATIVE
Opiates: NEGATIVE
Tetrahydrocannabinol: NEGATIVE

## 2024-08-07 LAB — BETA-HYDROXYBUTYRIC ACID
Beta-Hydroxybutyric Acid: 1.73 mmol/L — ABNORMAL HIGH (ref 0.05–0.27)
Beta-Hydroxybutyric Acid: 2.02 mmol/L — ABNORMAL HIGH (ref 0.05–0.27)
Beta-Hydroxybutyric Acid: 0.15 mmol/L (ref 0.05–0.27)

## 2024-08-07 LAB — STREP PNEUMONIAE URINARY ANTIGEN: Strep Pneumo Urinary Antigen: NEGATIVE

## 2024-08-07 LAB — CK: Total CK: 540 U/L — ABNORMAL HIGH (ref 49–397)

## 2024-08-07 LAB — PROTIME-INR
INR: 2.2 — ABNORMAL HIGH (ref 0.8–1.2)
Prothrombin Time: 25.3 s — ABNORMAL HIGH (ref 11.4–15.2)

## 2024-08-07 LAB — MAGNESIUM: Magnesium: 2.7 mg/dL — ABNORMAL HIGH (ref 1.7–2.4)

## 2024-08-07 MED ORDER — VANCOMYCIN VARIABLE DOSE PER UNSTABLE RENAL FUNCTION (PHARMACIST DOSING): Status: DC

## 2024-08-07 MED ORDER — SODIUM CHLORIDE 0.9 % IV BOLUS
1000.0000 mL | Freq: Once | INTRAVENOUS | Status: AC
  Administered 2024-08-07: 1000 mL via INTRAVENOUS

## 2024-08-07 MED ORDER — ACETAMINOPHEN 650 MG RE SUPP: 650.0000 mg | Freq: Four times a day (QID) | RECTAL | Status: DC | PRN

## 2024-08-07 MED ORDER — MAGNESIUM SULFATE 2 GM/50ML IV SOLN
2.0000 g | Freq: Once | INTRAVENOUS | Status: AC
  Administered 2024-08-07: 2 g via INTRAVENOUS
  Filled 2024-08-07: qty 50

## 2024-08-07 MED ORDER — SENNOSIDES-DOCUSATE SODIUM 8.6-50 MG PO TABS: 1.0000 | ORAL_TABLET | Freq: Every evening | ORAL | Status: DC | PRN

## 2024-08-07 MED ORDER — PIPERACILLIN-TAZOBACTAM 3.375 G IVPB 30 MIN
3.3750 g | Freq: Once | INTRAVENOUS | Status: AC
  Administered 2024-08-07: 3.375 g via INTRAVENOUS
  Filled 2024-08-07: qty 50

## 2024-08-07 MED ORDER — POTASSIUM CHLORIDE 2 MEQ/ML IV SOLN
INTRAVENOUS | Status: DC
  Filled 2024-08-07 (×2): qty 1000

## 2024-08-07 MED ORDER — METHYLPREDNISOLONE SODIUM SUCC 125 MG IJ SOLR
125.0000 mg | Freq: Once | INTRAMUSCULAR | Status: AC
  Administered 2024-08-07: 125 mg via INTRAVENOUS
  Filled 2024-08-07: qty 2

## 2024-08-07 MED ORDER — LACTATED RINGERS IV SOLN: INTRAVENOUS | Status: DC

## 2024-08-07 MED ORDER — PIPERACILLIN-TAZOBACTAM 3.375 G IVPB
3.3750 g | Freq: Three times a day (TID) | INTRAVENOUS | Status: DC
  Administered 2024-08-07 – 2024-08-08 (×2): 3.375 g via INTRAVENOUS
  Filled 2024-08-07 (×2): qty 50

## 2024-08-07 MED ORDER — INSULIN REGULAR(HUMAN) IN NACL 100-0.9 UT/100ML-% IV SOLN
INTRAVENOUS | Status: DC
  Administered 2024-08-07: 9 [IU]/h via INTRAVENOUS
  Filled 2024-08-07: qty 100

## 2024-08-07 MED ORDER — DEXTROSE IN LACTATED RINGERS 5 % IV SOLN: INTRAVENOUS | Status: DC

## 2024-08-07 MED ORDER — ACETAMINOPHEN 325 MG PO TABS: 650.0000 mg | ORAL_TABLET | Freq: Four times a day (QID) | ORAL | Status: DC | PRN

## 2024-08-07 MED ORDER — POTASSIUM CHLORIDE 10 MEQ/100ML IV SOLN
10.0000 meq | INTRAVENOUS | Status: AC
  Administered 2024-08-07 (×4): 10 meq via INTRAVENOUS
  Filled 2024-08-07 (×4): qty 100

## 2024-08-07 MED ORDER — FENTANYL CITRATE (PF) 50 MCG/ML IJ SOSY
25.0000 ug | PREFILLED_SYRINGE | Freq: Once | INTRAMUSCULAR | Status: AC
  Administered 2024-08-07: 25 ug via INTRAVENOUS
  Filled 2024-08-07: qty 1

## 2024-08-07 MED ORDER — IPRATROPIUM-ALBUTEROL 0.5-2.5 (3) MG/3ML IN SOLN: 3.0000 mL | Freq: Four times a day (QID) | RESPIRATORY_TRACT | Status: DC | PRN

## 2024-08-07 MED ORDER — VANCOMYCIN HCL 1750 MG/350ML IV SOLN
1750.0000 mg | Freq: Once | INTRAVENOUS | Status: AC
  Administered 2024-08-07: 1750 mg via INTRAVENOUS
  Filled 2024-08-07: qty 350

## 2024-08-07 MED ORDER — POTASSIUM CHLORIDE 10 MEQ/100ML IV SOLN
10.0000 meq | Freq: Once | INTRAVENOUS | Status: AC
  Administered 2024-08-07: 10 meq via INTRAVENOUS
  Filled 2024-08-07: qty 100

## 2024-08-07 MED ORDER — ALBUTEROL SULFATE (2.5 MG/3ML) 0.083% IN NEBU
5.0000 mg | INHALATION_SOLUTION | Freq: Once | RESPIRATORY_TRACT | Status: AC
  Administered 2024-08-07: 5 mg via RESPIRATORY_TRACT
  Filled 2024-08-07: qty 6

## 2024-08-07 MED ORDER — HEPARIN SODIUM (PORCINE) 5000 UNIT/ML IJ SOLN
5000.0000 [IU] | Freq: Three times a day (TID) | INTRAMUSCULAR | Status: DC
  Administered 2024-08-07 – 2024-08-09 (×6): 5000 [IU] via SUBCUTANEOUS
  Filled 2024-08-07 (×6): qty 1

## 2024-08-07 MED ORDER — CHLORHEXIDINE GLUCONATE CLOTH 2 % EX PADS
6.0000 | MEDICATED_PAD | Freq: Every day | CUTANEOUS | Status: DC
  Administered 2024-08-08 – 2024-08-09 (×2): 6 via TOPICAL
  Filled 2024-08-07 (×2): qty 6

## 2024-08-07 MED ORDER — SODIUM CHLORIDE 0.9% FLUSH
3.0000 mL | Freq: Two times a day (BID) | INTRAVENOUS | Status: DC
  Administered 2024-08-08: 3 mL via INTRAVENOUS

## 2024-08-07 MED ORDER — DEXTROSE 50 % IV SOLN: 0.0000 mL | INTRAVENOUS | Status: DC | PRN

## 2024-08-07 NOTE — ED Triage Notes (Signed)
 Pt came in via [facility] from [facility] due to family calling out because pt was not acting himself Pt found by EMS with SOB, 62% RA. Pt was put on nonrebreathing with sats at 92%. No acurrate O@ reading was obtain due to poor perfusion. Pt has history of hip surgery 2 weeks ago. Pt is alert and oriented to self. Pt using accessory muscle upon assessment. Pt with Skin tar on left forearm, left heel and right shoulder. Sacral ulcer noted. No acute distress at this time. MD Mian at bedside.

## 2024-08-07 NOTE — IPAL (Signed)
" °  Interdisciplinary Goals of Care Family Meeting   Date carried out: 08/07/2024  Location of the meeting: Bedside  Member's involved: Physician, Nurse Practitioner, and Family Member or next of kin  Durable Power of Attorney or acting medical decision maker: Pt's wife and son    Discussion: We discussed goals of care for [employer] .  Discussed clinical findings of current critical illness including: Severe Sepsis due to Pneumonia and UTI, with AKI, DKA, and Acute Metabolic Encephalopathy.    Attempted to elicit goals of care. We discussed code status, Full Code versus Do Not Resuscitate, Encouraged patient to consider DNR/DNI status understanding evidenced based poor outcomes in similar hospitalized patients, as the cause of the arrest is likely associated with chronic/terminal disease rather than a reversible acute cardio-pulmonary event.  Discussed that with his current critical illness superimposed on multiple chronic co morbidities and advanced age, overall long term prognosis is poor.  Should he cardiac arrest despite aggressive interventions, he likely would not have positive overall outcome.  They would like to continue current aggressive medical interventions (IV fluids, ABX, insulin , electrolytes, etc.) and allow time for outcomes.  They would be ok with vasopressors and central line placement if needed.  However they would not be accepting of dialysis, nor would they want us  to intubate or perform CPR should he decline. Code status changed to DNR/DNI.    Code status:   Code Status: Limited: Do not attempt resuscitation (DNR) -DNR-LIMITED -Do Not Intubate/DNI    Disposition: Continue current acute care  Time spent for the meeting: 25 minutes    Inge JONETTA Lecher, NP  08/07/2024, 1:42 PM   "

## 2024-08-07 NOTE — ED Notes (Addendum)
 Insulin  drip restarted per NP Keene's orders

## 2024-08-07 NOTE — ED Notes (Signed)
 Insulin  drip paused per NP Shellia orders

## 2024-08-07 NOTE — ED Notes (Signed)
 Swabbed pt mouth with water  swab and applied mouth moisturizer.

## 2024-08-07 NOTE — Consult Note (Signed)
 ED Pharmacy Antibiotic Sign Off An antibiotic consult was received from an ED provider for vancomycin  per pharmacy dosing for sepsis. A chart review was completed to assess appropriateness.   The following one time order(s) were placed:  Vancomycin  1750 mg IV x 1  Further antibiotic and/or antibiotic pharmacy consults should be ordered by the admitting provider if indicated.   Thank you for allowing pharmacy to be a part of this patient's care.   Kayla JULIANNA Blew, [facility]  Clinical Pharmacist 08/07/24 9:38 AM

## 2024-08-07 NOTE — Consult Note (Signed)
 "  NAME:  Timothy Mclean, MRN:  [id number], DOB:  06/25/1941, LOS: 0 ADMISSION DATE:  08/07/2024, CONSULTATION DATE:  08/07/2024 REFERRING MD:  Dr. Clarine, CHIEF COMPLAINT:  AMS, Shortness of breath, Hypoxia    Brief Pt Description / Synopsis:  84 y.o. male admitted with Severe Sepsis due to Pneumonia (Aspiration vs HCAP) and UTI, along with AKI, Rhabdomyolysis, DKA, and Acute Metabolic Encephalopathy.   History of Present Illness:  Timothy Mclean is a 84 y.o. male with a past medical significant for dementia, asthma, COPD, atrial fibrillation on Eliquis , CAD s/p STEMI and CABG, CKD stage IIIa, diabetes mellitus type 2, and GERD who presented to [facility] ED from [facility] as family reported he was not acting himself this morning at the facility.  They report yesterday he acted as if he didn't feel good, but was cognitively at his baseline.  But this morning they report he was unresponsive.  They deny any known chest pain, abdominal pain, nausea, vomiting, diarrhea, dysuria, fever, or chills.   Upon EMS arrival he was noted to be short of breath and hypoxic with O2 saturations of 62% on room air and to be altered.    Of note he was recently admitted at [facility] from 07/26/24 throught 08/02/24 for Right hip fracture of which he underwent intramedullary nailing on 07/27/24.  Course was complicated by aspiration pneumonia due to dysphagia and AKI due to bilateral hydroureteronephrosis requiring foley catheter placement.   ED Course: Initial Vital Signs: Temperature 96.3 F, pulse 110, respiratory 25, blood pressure 109/58, SpO2 95% on nonrebreather Significant Labs: Sodium 149, potassium 2.9, glucose 498, BUN 67, creatinine 2.1, alkaline phosphatase 170, albumin  2.7, AST 68, total bilirubin 1.6, high-sensitivity troponin 180, lactic acid 6.0, WBC 37.1, hemoglobin 10.6, hematocrit 94.1, INR 2.2, beta hydroxybutyrate acid 1.73 COVID/flu/RSV PCR is negative Urinalysis consistent with UTI Imaging Chest  X-ray>>IMPRESSION: 1. Mixed interstitial and airspace opacification in the left lower lobe and possibly hazy opacification in the right perihilar region, findings which may be due to pneumonia. Aspiration not excluded. 2. Small bilateral pleural effusions. CT Head wo Contrast>>IMPRESSION: 1. No acute intracranial abnormality. 2. Similar chronic microvascular ischemic change. CT Chest/Abdomen/Pelvis>>IMPRESSION: 1. Consolidation in the lower lobes bilaterally, significantly worse within the left lower lobe, concerning for pneumonia, possibly from aspiration. 2. Moderate centrilobular emphysema, predominantly involving the upper lobes bilaterally. Medications Administered: 1 L normal saline bolus (reduced IV fluids given due to no hypotension and elevated BNP), IV Zosyn  and vancomycin   He met sepsis criteria therefore he was given IV fluids and broad-spectrum antibiotics, cultures were sent.  [facility] asked to admit for further workup and treatment.  Please see Significant Hospital Events section below for full detailed hospital course.   Pertinent  Medical History   Past Medical History:  Diagnosis Date   Allergy    Anxiety    Asthma    since 2013   Barrett's esophagus 03/09/2014   Bilateral lumbar radiculopathy    since 2013   CAD S/P percutaneous coronary angioplasty 1987   a. 1987 angioplasty and BMS- Cx; b. 1997 CABG x 4;  c. MYOVIEW  1/'05: ? Ischemia vs. artifact --> sent for cath - patent grafts.   Chronic atrial fibrillation (HCC)    a. Prev on amiodarone -->d/c 08/2016 in setting of recurrent AF, LFT abnormalities, and pulmonary symptoms;  b. CHA2DS2VASc = 4-->eliquis .   Chronic bronchitis (HCC)    since 2007   COPD (chronic obstructive pulmonary disease) (HCC)    DDD (degenerative disc disease)  Diverticulosis    Dyslipidemia, goal LDL below 70    Doing better off of Zocor, currently on Lescol    ED (erectile dysfunction)    Elevated LFTs    Esophageal reflux     GERD (gastroesophageal reflux disease)    Gout, unspecified    Hearing loss    Helicobacter pylori gastritis 07/2009   Herpes zoster 2009   Hypothyroidism    since 2010   Iron deficiency anemia, unspecified    Leg weakness    a. 09/2016 ABI's: R 1.05, L 0.98.   Other and unspecified hyperlipidemia    Other B-complex deficiencies    Personal history of colonic polyps    Prostatitis    chronic ulcerative   Pulmonary fibrosis (HCC)    and bronchiectasis, since 2015   Renal stone    Right   S/P CABG x 4 1997   a) LIMA-LAD, SVG to OM, SVG-dRCA-RPL; b) FALSE + MYOVIEW  --> CATH 1/'05: 100% LAD after widelly patent D1, all Cx OM branches 100%, pRCA 100%, SVG-PDA patent w/ retrograde filling of RPL, SVG-OM widely patent ~ normal OM, LIMA-LAD patent.   Spinal stenosis    ST elevation myocardial infarction (STEMI) of inferior wall, subsequent episode of care (HCC) 1987, 1997   a) PTCA of Cx; b) CABG    Type II or unspecified type diabetes mellitus without mention of complication, not stated as uncontrolled    no meds   Ulcerative colitis, unspecified    Unspecified essential hypertension    Vitamin D  deficiency     Micro Data:  1/19: COVID/FLU RSV PCR>> negative 1/19: Blood cultures x2>> 1/19: Urine>> 1/19: Strep pneumo urinary antigen>> 1/19; Legionella urinary antigen>>  Antimicrobials:   Anti-infectives (From admission, onward)    Start     Dose/Rate Route Frequency Ordered Stop   08/07/24 1900  piperacillin -tazobactam (ZOSYN ) IVPB 3.375 g        3.375 g 12.5 mL/hr over 240 Minutes Intravenous Every 8 hours 08/07/24 1302     08/07/24 1312  vancomycin  variable dose per unstable renal function (pharmacist dosing)         Does not apply See admin instructions 08/07/24 1312     08/07/24 0945  vancomycin  (VANCOREADY) IVPB 1750 mg/350 mL        1,750 mg 175 mL/hr over 120 Minutes Intravenous  Once 08/07/24 0937 08/07/24 1243   08/07/24 0930  piperacillin -tazobactam (ZOSYN ) IVPB  3.375 g        3.375 g 100 mL/hr over 30 Minutes Intravenous  Once 08/07/24 0923 08/07/24 1041       Significant Hospital Events: Including procedures, antibiotic start and stop dates in addition to other pertinent events   1/19: Presented to ED with altered mental status, hypoxia, acute respiratory distress.  Being admitted by [facility] for sepsis due to pneumonia and UTI, DKA, AKI, acute metabolic encephalopathy.  PCCM consulted due to to multiorgan failure and high risk for decompensation.  CODE STATUS changed to DNR/DNI.  Interim History / Subjective:  As outlined above under Significant Hospital Events section  Objective   Blood pressure (!) 168/116, pulse (!) 103, temperature (!) 96.3 F (35.7 C), temperature source Rectal, resp. rate (!) 26, SpO2 92%.    FiO2 (%):  [50 %] 50 %  No intake or output data in the 24 hours ending 08/07/24 1302 There were no vitals filed for this visit.  Examination: General: Critically ill on chronically ill-appearing elderly male, laying in bed, on nasal cannula, in  no acute distress HENT: Atraumatic, no cephalic, neck supple, no JVD, dry mucous membranes Lungs: Coarse breath sounds throughout, even, tachypneic, mild accessory muscle use Cardiovascular: Tachycardia, regular rhythm, S1-S2, no murmurs, rubs, gallops Abdomen: Limited abdominal exam due to altered mental status: Soft, nontender, nondistended, no guarding over tenderness, bowel sounds positive x 4 Extremities: No deformities, no edema, no cyanosis Neuro: Awake but altered, does not follow commands, moves some extremities purposefully, PERRLA GU: Deferred  Resolved Hospital Problem list     Assessment & Plan:   #Meets SIRS Criteria on presentation (RR 24, pulse 109, WBC 37.1, Temperature 96.3 F,) #Severe Sepsis due to ... #Pneumonia: Aspiration vs HCAP #UTI PMHx: recent aspiration pneumonia -Monitor fever curve -Trend WBC's & Procalcitonin -Follow cultures as above -Continue  empiric Vancomycin  and Zosyn  pending cultures & sensitivities  #Acute Hypoxic Respiratory Failure due to Pneumonia and multiple metabolic derangements #COPD without acute exacerbation PMHx: Asthma, Pulmonary Fibrosis  -Supplemental O2 as needed to maintain O2 sats 88 to 92% -BiPAP is poor option given high aspiration risk (now DNR/DNI) -Follow intermittent Chest X-ray & ABG as needed -Bronchodilators  -IV Steroids -ABX as above -Diuresis as BP and renal function permits -Pulmonary toilet as able  #Elevated Troponin in setting of demand ischemia #Atrial Fibrillation, currently rate controlled #Hypertension  PMHx: Atrial Fibrillation on Eliquis , CAD s/p STEMI and CABG, orthostatic hypotension, hyperlipidemia  -Continuous cardiac monitoring -Maintain MAP >65 -Gentle IV fluids -Vasopressors as needed to maintain MAP goal ~ not requiring currently  -Trend lactic acid until normalized -HS Troponin peaked at 180 -Echocardiogram pending  #Acute Kidney Injury on CKD Stage IIIa #Rhabdomyolysis  #Anion Gap Metabolic Acidosis due to Lactic Acidosis and DKA #Hypokalemia PMHx: recent Bilateral Hydroureteronephrosis requiring foley catheter -Monitor I&O's / urinary output -Follow BMP -Ensure adequate renal perfusion -Avoid nephrotoxic agents as able -Replace electrolytes as indicated ~ Pharmacy following for assistance with electrolyte replacement -IV fluids -Continue foley catheter  -Trend lactic   #DKA #Diabetes Mellitus Type II #Hypothyroidism -Follow DKA Protocol -IV fluids -Insulin  drip -Follow BMP q4h & Beta-Hydroxybutyric acid q8h -Once Anion Gap is closed and serum Bicarb >20, can convert to SSI & Basal insulin  -Diabetes Coordinator consulted, appreciate input -Check Hgb A1c -Resume home Synthroid  once able to tolerate PO  #Elevated LFT's -CT Abdomen & Pelvis without acute intraabdominal process -Trend LFT's and coags   #Acute Metabolic Encephalopathy #Dementia   #Hypothyroidism -CT Head negative for acute intracranial abnormality  -Treatment of metabolic derangements as outlined above -Provide supportive care -Promote normal sleep/wake cycle and family presence -Avoid sedating medications as able -Check TSH and Urine drug screen   #Recent Right Hip Fracture s/p intramedullary nailing on 07/27/24 -PT/OT -Follow with Orthopedics outpatient      Pt is critically ill with multiorgan failure. Prognosis is guarded, high risk for further decompensation, cardiac arrest, and death.  Given current critical illness superimposed on multiple chronic co-morbidities and advanced age, overall long term prognosis is poor.  Now is DNR/DNI status.  Consider consulting Palliative Care to assist with GOC discussions.   Best Practice (right click and Reselect all SmartList Selections daily)   Diet/type: NPO DVT prophylaxis: prophylactic heparin   GI prophylaxis: PPI Lines: N/A Foley:  Yes, and it is still needed Code Status:  DNR Last date of multidisciplinary goals of care discussion [N/A]  1/19: Pt's wife and son updated at bedside on plan of care.  Labs   CBC: Recent Labs  Lab 08/01/24 1253 08/02/24 0508 08/07/24 0949  WBC 10.9*  11.8* 37.1*  NEUTROABS 9.5* 9.9* 34.9*  HGB 9.2* 9.9* 10.6*  HCT 26.9* 28.9* 33.6*  MCV 88.8 89.2 94.1  PLT 223 264 399    Basic Metabolic Panel: Recent Labs  Lab 08/01/24 1253 08/02/24 0508 08/07/24 1005 08/07/24 1150  NA 130* 136 149* 139  K 3.4* 4.1 2.9* 2.9*  CL 98 102 107 99  CO2 22 23 22  15*  GLUCOSE 283* 186* 498* 407*  BUN 38* 36* 67* 57*  CREATININE 1.24 1.14 2.13* 1.82*  CALCIUM  8.2* 8.4* 8.5* 7.1*   GFR: Estimated Creatinine Clearance: 31.4 mL/min (A) (by C-G formula based on SCr of 1.82 mg/dL (H)). Recent Labs  Lab 08/01/24 1253 08/02/24 0508 08/07/24 0949 08/07/24 1150  WBC 10.9* 11.8* 37.1*  --   LATICACIDVEN  --   --  6.0* 7.3*    Liver Function Tests: Recent Labs  Lab  08/07/24 1005  AST 68*  ALT 40  ALKPHOS 170*  BILITOT 1.6*  PROT 5.1*  ALBUMIN  2.7*   Recent Labs  Lab 08/07/24 1005  LIPASE 22   No results for input(s): AMMONIA in the last 168 hours.  ABG    Component Value Date/Time   PHART 7.378 04/27/2021 0239   PCO2ART 32.6 04/27/2021 0239   PO2ART 110 (H) 04/27/2021 0239   HCO3 22.2 08/07/2024 0908   TCO2 23 07/26/2024 1520   ACIDBASEDEF 3.9 (H) 08/07/2024 0908   O2SAT 39.8 08/07/2024 0908     Coagulation Profile: Recent Labs  Lab 08/07/24 0949  INR 2.2*    Cardiac Enzymes: No results for input(s): CKTOTAL, CKMB, CKMBINDEX, TROPONINI in the last 168 hours.  HbA1C: Hgb A1c MFr Bld  Date/Time Value Ref Range Status  07/27/2024 01:14 PM 8.5 (H) 4.8 - 5.6 % Final    Comment:    (NOTE) Diagnosis of Diabetes The following HbA1c ranges recommended by the American Diabetes Association (ADA) may be used as an aid in the diagnosis of diabetes mellitus.  Hemoglobin             Suggested A1C NGSP%              Diagnosis  <5.7                   Non Diabetic  5.7-6.4                Pre-Diabetic  >6.4                   Diabetic  <7.0                   Glycemic control for                       adults with diabetes.    04/26/2021 03:01 PM 7.7 (H) 4.8 - 5.6 % Final    Comment:    (NOTE) Pre diabetes:          5.7%-6.4%  Diabetes:              >6.4%  Glycemic control for   <7.0% adults with diabetes     CBG: Recent Labs  Lab 08/02/24 0548 08/02/24 0822 08/02/24 1146 08/02/24 1702 08/07/24 1259  GLUCAP 201* 179* 208* 223* 423*    Review of Systems:   Unable to assess due to AMS   Past Medical History:  He,  has a past medical history of Allergy, Anxiety, Asthma, Barrett's esophagus (03/09/2014), Bilateral lumbar radiculopathy,  CAD S/P percutaneous coronary angioplasty (1987), Chronic atrial fibrillation (HCC), Chronic bronchitis (HCC), COPD (chronic obstructive pulmonary disease) (HCC), DDD  (degenerative disc disease), Diverticulosis, Dyslipidemia, goal LDL below 70, ED (erectile dysfunction), Elevated LFTs, Esophageal reflux, GERD (gastroesophageal reflux disease), Gout, unspecified, Hearing loss, Helicobacter pylori gastritis (07/2009), Herpes zoster (2009), Hypothyroidism, Iron deficiency anemia, unspecified, Leg weakness, Other and unspecified hyperlipidemia, Other B-complex deficiencies, Personal history of colonic polyps, Prostatitis, Pulmonary fibrosis (HCC), Renal stone, S/P CABG x 4 (1997), Spinal stenosis, ST elevation myocardial infarction (STEMI) of inferior wall, subsequent episode of care (HCC) (1987, 1997), Type II or unspecified type diabetes mellitus without mention of complication, not stated as uncontrolled, Ulcerative colitis, unspecified, Unspecified essential hypertension, and Vitamin D  deficiency.   Surgical History:   Past Surgical History:  Procedure Laterality Date   BLEPHAROPLASTY Bilateral    for ptosis   CARDIAC CATHETERIZATION  08/17/2003   False positive stress test  grafts patent; RCA proximal 100% LAD 100% occlusion after normal D1 with 80% ostial as SP1; circumflex patent but OM1 on to all occluded; EF 50-55%   CARDIAC CATHETERIZATION  11/1995   Preop CABG: LAD 90% at D1, circumflex 100 and OM a 90% proximal, 80% distal   Carotid Duplex Doppler  12/23/2009   Right&Left ICAs 0-49%, mildly abnormal study,    CHOLECYSTECTOMY     COLONOSCOPY     CORONARY ARTERY BYPASS GRAFT  11/1995   LIMA-LAD, SVG to OM, SVG-dRCA-RPL   ESOPHAGOGASTRODUODENOSCOPY     FLEXIBLE SIGMOIDOSCOPY     INTRAMEDULLARY (IM) NAIL INTERTROCHANTERIC Right 07/27/2024   Procedure: FIXATION, FRACTURE, INTERTROCHANTERIC, WITH INTRAMEDULLARY ROD RIGHT HIP;  Surgeon: Celena Sharper, MD;  Location: [facility] OR;  Service: Orthopedics;  Laterality: Right;   NM MYOCAR PERF EJECTION FRACTION  07/03/2003   FALSE POSITIVE TEST; Bruce protocol, negative test with scintigraphic evidence of inferoapical  scar, diaphragmatic attenuation, moderate ischemia.   NM MYOVIEW  LTD  2005   Questionable ischemia that is likely either infarct versus artifact; normal   TRANSTHORACIC ECHOCARDIOGRAM  12/2017   EF 55-60%.  Unable to assess diastolic function due to A. fib.  Moderate LA/RA dilation.  Mild MR.     Social History:   reports that he quit smoking about 39 years ago. His smoking use included cigarettes. He started smoking about 71 years ago. He has a 32 pack-year smoking history. He has never used smokeless tobacco. He reports that he does not drink alcohol  and does not use drugs.   Family History:  His family history includes CVA in his sister; Congestive Heart Failure in his mother; Dementia in his father; Diabetes in his mother and sister; Heart disease in his sister; Hypertension in his sister; Lung cancer in his brother; Pneumonia in his father. There is no history of Colon cancer, Esophageal cancer, Rectal cancer, or Stomach cancer.   Allergies Allergies[1]   Home Medications  Prior to Admission medications  Medication Sig Start Date End Date Taking? Authorizing Provider  albuterol  (VENTOLIN  HFA) 108 (90 Base) MCG/ACT inhaler Inhale 2 puffs into the lungs every 4 (four) hours as needed for chest congestion, cough, shortness of breath, or wheezing 09/16/23  Yes   apixaban  (ELIQUIS ) 5 MG TABS tablet Take 1 tablet (5 mg total) by mouth 2 (two) times daily. 05/24/24  Yes Anner Alm ORN, MD  [Paused] Cholecalciferol  (VITAMIN D ) 50 MCG (2000 UT) CAPS Take 1,000 Units by mouth daily. Wait to take this until: August 20, 2024   Yes [provider]  cyanocobalamin   1000 MCG tablet Take 1,000 mcg by mouth at bedtime.   Yes [provider]  diltiazem  (CARDIZEM  CD) 120 MG 24 hr capsule Take 1 capsule (120 mg total) by mouth in the morning. 09/16/23  Yes   diltiazem  (CARDIZEM ) 60 MG tablet Take 60 mg by mouth 2 (two) times daily.   Yes [provider]  empagliflozin  (JARDIANCE )  25 MG TABS tablet Take 1 tablet (25 mg total) by mouth every morning. 04/18/24  Yes   EPINEPHrine  (EPIPEN  2-PAK) 0.3 mg/0.3 mL IJ SOAJ injection Inject 0.3 mg into the muscle as needed for allergic reaction 09/16/23  Yes   esomeprazole (NEXIUM) 20 MG packet Take 20 mg by mouth daily before breakfast.   Yes [provider]  ezetimibe  (ZETIA ) 10 MG tablet Take 1 tablet (10 mg total) by mouth daily. 09/16/23  Yes   finasteride  (PROSCAR ) 5 MG tablet Take 1 tablet (5 mg total) by mouth daily. Patient taking differently: Take 5 mg by mouth at bedtime. 09/16/23  Yes   fluticasone -salmeterol (ADVAIR ) 250-50 MCG/ACT AEPB Inhale 1 puff into the lungs 2 (two) times daily. 09/16/23  Yes   furosemide  (LASIX ) 40 MG tablet Take 40 mg by mouth at bedtime.   Yes [provider]  HYDROcodone -acetaminophen  (NORCO/VICODIN) 5-325 MG tablet Take 1-2 tablets by mouth every 6 (six) hours as needed for moderate pain (pain score 4-6). 08/02/24  Yes Juliane Ozell PARAS, PA-C  levothyroxine  (SYNTHROID ) 75 MCG tablet Take 1 tablet (75 mcg total) by mouth daily. 09/16/23  Yes   midodrine  (PROAMATINE ) 10 MG tablet Take 1 tablet (10 mg total) by mouth 3 (three) times daily with meals. 08/02/24  Yes Briana Elgin LABOR, MD  Multiple Vitamin (MULTIVITAMIN WITH MINERALS) TABS tablet Take 1 tablet by mouth daily. 08/03/24  Yes Briana Elgin LABOR, MD  mupirocin  ointment (BACTROBAN ) 2 % Apply 1 Application topically 2 (two) times daily. Patient taking differently: Apply 1 Application topically daily. 06/16/24  Yes Stuart Vernell Norris, PA-C  oxybutynin  (DITROPAN ) 5 MG tablet Take 1 tablet (5 mg total) by mouth 2 (two) times daily. Patient taking differently: Take 5 mg by mouth at bedtime. 09/16/23  Yes   povidone-Iodine (BETADINE) 5 % SOLN topical solution Apply 1 Application topically 3 (three) times daily as needed for wound care.   Yes [provider]  senna (SENOKOT) 8.6 MG TABS tablet Take 2 tablets (17.2 mg total) by  mouth at bedtime for 5 days. 08/02/24 08/07/24 Yes Briana Elgin LABOR, MD  sulfaSALAzine (AZULFIDINE) 500 MG tablet Take 500 mg by mouth 3 (three) times daily.   Yes [provider]  balsalazide (COLAZAL ) 750 MG capsule TAKE 2 CAPSULES BY MOUTH TWICE DAILY Patient taking differently: Take 1,500 mg by mouth in the morning and at bedtime. 12/05/19   Avram Lupita BRAVO, MD  Dulaglutide  (TRULICITY ) 1.5 MG/0.5ML SOAJ Inject 1.5 mg as directed once a week. Patient taking differently: Inject 1.5 mg as directed once a week. On wednesday 04/18/24     food thickener (SIMPLYTHICK, HONEY/LEVEL 3/MODERATELY THICK,) LIQD Take 1 packet by mouth as needed. 08/02/24   Briana Elgin LABOR, MD  pantoprazole  (PROTONIX ) 40 MG tablet Take 1 tablet (40 mg total) by mouth 2 (two) times daily - 1/2-1 hour before meals 09/16/23     rosuvastatin  (CRESTOR ) 40 MG tablet Take 1 tablet (40 mg total) by mouth every evening after a meal 09/16/23     Vitamin D , Ergocalciferol , (DRISDOL ) 1.25 MG (50000 UNIT) CAPS capsule Take 1 capsule (  50,000 Units total) by mouth every 7 (seven) days for 3 doses. 08/05/24 08/20/24  Briana Elgin LABOR, MD     Critical care time: 60 minutes     Inge Lecher, AGACNP-BC [facility] Prefer epic messenger for cross cover needs If after hours, please call E-link       [1]  Allergies Allergen Reactions   Atorvastatin Other (See Comments)    Arthralgia, myalgia   Fluvastatin Other (See Comments)    Arthralgia, myalgias   Hymenoptera Venom Preparations Other (See Comments)    Unknown    Lisinopril Cough   Metformin And Related Other (See Comments)    Anorexia, diarrhea, nausea, weight loss   Pravastatin Other (See Comments)    Arthralgia, myalgias   Simvastatin Other (See Comments)    myalgias   Spiriva Handihaler [Tiotropium Bromide] Nausea Only   "

## 2024-08-07 NOTE — Consult Note (Signed)
 Pharmacy Antibiotic Note  Timothy Mclean is a 84 y.o. male admitted on 08/07/2024 with sepsis possibly d/t UTI. Prior admission on 1/7 patient had complications with CAP and urinary retention and bilateral hydroureteronephrosis requiring a foley placement and discharged on cefdinir  and azithro. UA on admission positive for a large amount of leukocytes and bacteria.  Pharmacy has been consulted for vancomycin  and zosyn  dosing.  Patient has an AKI likely 2/2 sepsis. Scr. 2.13 (BL~1.14-1.31)   Plan: Vancomycin  1750 mg LD x1  Will follow vancomycin  variable dosing protocol given AKI on admission and order a 24-hr random lvl. Will follow renal function and cultures to adjust antibiotic regimen Zosyn  3.375g IV q8h (4 hour infusion).   Temp (24hrs), Avg:96.3 F (35.7 C), Min:96.3 F (35.7 C), Max:96.3 F (35.7 C)  Recent Labs  Lab 08/01/24 1253 08/02/24 0508 08/07/24 0949 08/07/24 1005 08/07/24 1150  WBC 10.9* 11.8* 37.1*  --   --   CREATININE 1.24 1.14  --  2.13* 1.82*  LATICACIDVEN  --   --  6.0*  --  7.3*    Estimated Creatinine Clearance: 31.4 mL/min (A) (by C-G formula based on SCr of 1.82 mg/dL (H)).    Allergies[1]  Antimicrobials this admission: 1/19 vancomycin  >>  1/19 zosyn  >>    Microbiology results: 1/19 BCx: pending 1/19 UCx: pending    Thank you for allowing pharmacy to be a part of this patients care.  Annabella LOISE Banks, PharmD Clinical Pharmacist 08/07/2024 1:11 PM      [1]  Allergies Allergen Reactions   Atorvastatin Other (See Comments)    Arthralgia, myalgia   Fluvastatin Other (See Comments)    Arthralgia, myalgias   Hymenoptera Venom Preparations Other (See Comments)    Unknown    Lisinopril Cough   Metformin And Related Other (See Comments)    Anorexia, diarrhea, nausea, weight loss   Pravastatin Other (See Comments)    Arthralgia, myalgias   Simvastatin Other (See Comments)    myalgias   Spiriva Handihaler [Tiotropium Bromide] Nausea  Only

## 2024-08-07 NOTE — ED Provider Notes (Signed)
 "  [facility] Provider Note    Event Date/Time   First MD Initiated Contact with Patient 08/07/24 [phone]     (approximate)   History   Altered Mental Status and Shortness of Breath  Pt came in via [facility] from [facility] due to family calling out because pt was not acting himself Pt found by EMS with SOB, 62% RA. Pt was put on nonrebreathing with sats at 92%. No acurrate O@ reading was obtain due to poor perfusion. Pt has history of hip surgery 2 weeks ago. Pt is alert and oriented to self. Pt using accessory muscle upon assessment. Pt with Skin tar on left forearm, left heel and right shoulder. Sacral ulcer noted. No acute distress at this time. MD Dailyn Reith at bedside.     HPI Timothy Mclean is a 84 y.o. male PMH multiple medical comorbidities including dementia, asthma/COPD, pulmonary fibrosis, atrial fibrillation on Eliquis , CAD status post STEMI and CABG, CKD, DM2, GERD presents for evaluation of hypoxia, altered mental status -Per EMS, comes from [facility].  They were called for altered mental status.  Family was with patient yesterday evening around 6 PM and noted he was acting like his usual self.  This morning is minimally verbal and notably confused.  Per EMS, oxygen  saturations in the 60s.  Poor waveform but they did see some oxygen  saturations in the low 90s on nonrebreather and route to ED.  Did recently have a hip surgery. - Patient is a noncontributory historian.  Family confirms he saw in his usual state of health yesterday evening.   Per chart review, patient was admitted 07/26/2024-08/02/2024 after a fall, found to have right hip fracture.  Orthopedics placed intramedullary nail.  PT/OT recommended discharge to SNF.  Hospital course complicated by dysphagia, aspiration pneumonia, urinary retention with bilateral hydroureteronephrosis requiring Foley catheter placement.  Appears does have orthostatic hypotension at baseline and is on midodrine .   Was discharged on cefdinir  and azithromycin .     Physical Exam   Triage Vital Signs: BP (!) 168/116   Pulse (!) 103   Temp 98.4 F (36.9 C) (Axillary)   Resp (!) 26   SpO2 92%    Most recent vital signs: Vitals:   08/07/24 1230 08/07/24 1457  BP: (!) 168/116   Pulse: (!) 103   Resp: (!) 26   Temp:  98.4 F (36.9 C)  SpO2: 92%      General: Awake, tachypnic, confused, cool to touch. + Toxic appearing. HEENT: Normocephalic, atraumatic, pupils mildly dilated bilaterally, EOMI CV:  Good peripheral perfusion.  Tachycardic, RP 2+ Resp:  Tachypneic, speaking 1 word at a time, diminished breath sounds bilaterally, no obvious wheezing appreciated Abd:  No distention. Nontender to deep palpation throughout Other:  Foley catheter in place draining yellow urine.  Skin avulsion to the left forearm, appears subacute, suspect related to recent fall.  Some superficial sacral ulcers with no significant surrounding erythema and no purulence, not cavitary.  Also some mild blisters to right anterior shin as well as possible popped blister to left heel. Neuro:  Sleepy but arousable, unclear orientation, moving all extremity spontaneously, no focal motor deficit appreciated   ED Results / Procedures / Treatments   Labs (all labs ordered are listed, but only abnormal results are displayed) Labs Reviewed  LACTIC ACID, PLASMA - Abnormal; Notable for the following components:      Result Value   Lactic Acid, Venous 6.0 (*)    All other components  within normal limits  LACTIC ACID, PLASMA - Abnormal; Notable for the following components:   Lactic Acid, Venous 7.3 (*)    All other components within normal limits  COMPREHENSIVE METABOLIC PANEL WITH GFR - Abnormal; Notable for the following components:   Sodium 149 (*)    Potassium 2.9 (*)    Glucose, Bld 498 (*)    BUN 67 (*)    Creatinine, Ser 2.13 (*)    Calcium  8.5 (*)    Total Protein 5.1 (*)    Albumin  2.7 (*)    AST 68 (*)     Alkaline Phosphatase 170 (*)    Total Bilirubin 1.6 (*)    GFR, Estimated 30 (*)    Anion gap 20 (*)    All other components within normal limits  URINALYSIS, W/ REFLEX TO CULTURE (INFECTION SUSPECTED) - Abnormal; Notable for the following components:   Color, Urine YELLOW (*)    APPearance TURBID (*)    Glucose, UA >=500 (*)    Hgb urine dipstick MODERATE (*)    Leukocytes,Ua LARGE (*)    Bacteria, UA MANY (*)    All other components within normal limits  BLOOD GAS, VENOUS - Abnormal; Notable for the following components:   pCO2, Ven 43 (*)    Acid-base deficit 3.9 (*)    All other components within normal limits  PRO BRAIN NATRIURETIC PEPTIDE - Abnormal; Notable for the following components:   Pro Brain Natriuretic Peptide 7,198.0 (*)    All other components within normal limits  CBC WITH DIFFERENTIAL/PLATELET - Abnormal; Notable for the following components:   WBC 37.1 (*)    RBC 3.57 (*)    Hemoglobin 10.6 (*)    HCT 33.6 (*)    RDW 16.9 (*)    Neutro Abs 34.9 (*)    Abs Immature Granulocytes 0.32 (*)    All other components within normal limits  PROTIME-INR - Abnormal; Notable for the following components:   Prothrombin Time 25.3 (*)    INR 2.2 (*)    All other components within normal limits  BASIC METABOLIC PANEL WITH GFR - Abnormal; Notable for the following components:   Potassium 2.9 (*)    CO2 15 (*)    Glucose, Bld 407 (*)    BUN 57 (*)    Creatinine, Ser 1.82 (*)    Calcium  7.1 (*)    GFR, Estimated 36 (*)    Anion gap 26 (*)    All other components within normal limits  BASIC METABOLIC PANEL WITH GFR - Abnormal; Notable for the following components:   Sodium 152 (*)    CO2 20 (*)    Glucose, Bld 465 (*)    BUN 66 (*)    Creatinine, Ser 2.21 (*)    Calcium  8.0 (*)    GFR, Estimated 28 (*)    Anion gap 25 (*)    All other components within normal limits  BETA-HYDROXYBUTYRIC ACID - Abnormal; Notable for the following components:   Beta-Hydroxybutyric  Acid 1.73 (*)    All other components within normal limits  BETA-HYDROXYBUTYRIC ACID - Abnormal; Notable for the following components:   Beta-Hydroxybutyric Acid 2.02 (*)    All other components within normal limits  CK - Abnormal; Notable for the following components:   Total CK 540 (*)    All other components within normal limits  CBG MONITORING, ED - Abnormal; Notable for the following components:   Glucose-Capillary 423 (*)    All other components  within normal limits  CBG MONITORING, ED - Abnormal; Notable for the following components:   Glucose-Capillary 421 (*)    All other components within normal limits  CBG MONITORING, ED - Abnormal; Notable for the following components:   Glucose-Capillary 364 (*)    All other components within normal limits  CBG MONITORING, ED - Abnormal; Notable for the following components:   Glucose-Capillary 271 (*)    All other components within normal limits  TROPONIN T, HIGH SENSITIVITY - Abnormal; Notable for the following components:   Troponin T High Sensitivity 180 (*)    All other components within normal limits  TROPONIN T, HIGH SENSITIVITY - Abnormal; Notable for the following components:   Troponin T High Sensitivity 150 (*)    All other components within normal limits  RESP PANEL BY RT-PCR (RSV, FLU A&B, COVID)  RVPGX2  CULTURE, BLOOD (ROUTINE X 2)  CULTURE, BLOOD (ROUTINE X 2)  URINE CULTURE  MRSA NEXT GEN BY PCR, NASAL  LIPASE, BLOOD  URINE DRUG SCREEN  CBC WITH DIFFERENTIAL/PLATELET  BASIC METABOLIC PANEL WITH GFR  BASIC METABOLIC PANEL WITH GFR  BETA-HYDROXYBUTYRIC ACID  BETA-HYDROXYBUTYRIC ACID  STREP PNEUMONIAE URINARY ANTIGEN  LEGIONELLA PNEUMOPHILA SEROGP 1 UR AG  LACTIC ACID, PLASMA  LACTIC ACID, PLASMA  MAGNESIUM   THYROID  PANEL WITH TSH  BASIC METABOLIC PANEL WITH GFR  BETA-HYDROXYBUTYRIC ACID     EKG  Ecg = atrial fibrillation, rate 116, no gross ST elevation or depression, no significant repolarization  abnormality, normal axis, otherwise normal intervals.  No clear evidence of ischemia on my interpretation.   RADIOLOGY Radiology interpreted by myself radiology reports reviewed.  Notable for bilateral pneumonia.    PROCEDURES:  Critical Care performed: Yes, see critical care procedure note(s)  .Critical Care  Performed by: Clarine Ozell LABOR, MD Authorized by: Clarine Ozell LABOR, MD   Critical care provider statement:    Critical care time (minutes):  90   Critical care time was exclusive of:  Separately billable procedures and treating other patients   Critical care was necessary to treat or prevent imminent or life-threatening deterioration of the following conditions:  Sepsis and respiratory failure   Critical care was time spent personally by me on the following activities:  Development of treatment plan with patient or surrogate, discussions with consultants, evaluation of patient's response to treatment, examination of patient, ordering and review of laboratory studies, ordering and review of radiographic studies, ordering and performing treatments and interventions, pulse oximetry, re-evaluation of patient's condition and review of old charts   I assumed direction of critical care for this patient from another provider in my specialty: no     Care discussed with: admitting provider      MEDICATIONS ORDERED IN ED: Medications  insulin  regular, human (MYXREDLIN ) 100 units/ 100 mL infusion (8 Units/hr Intravenous Rate/Dose Change 08/07/24 1530)  dextrose  5 % in lactated ringers  infusion (0 mLs Intravenous Hold 08/07/24 1153)  dextrose  50 % solution 0-50 mL (has no administration in time range)  potassium chloride  10 mEq in 100 mL IVPB (10 mEq Intravenous New Bag/Given 08/07/24 1527)  piperacillin -tazobactam (ZOSYN ) IVPB 3.375 g (has no administration in time range)  vancomycin  variable dose per unstable renal function (pharmacist dosing) (has no administration in time range)  sodium  chloride flush (NS) 0.9 % injection 3 mL (3 mLs Intravenous Not Given 08/07/24 1440)  acetaminophen  (TYLENOL ) tablet 650 mg (has no administration in time range)    Or  acetaminophen  (TYLENOL ) suppository 650 mg (has  no administration in time range)  senna-docusate (Senokot-S) tablet 1 tablet (has no administration in time range)  heparin  injection 5,000 Units (has no administration in time range)  ipratropium-albuterol  (DUONEB) 0.5-2.5 (3) MG/3ML nebulizer solution 3 mL (has no administration in time range)  lactated ringers  1,000 mL with potassium chloride  40 mEq infusion ( Intravenous New Bag/Given 08/07/24 1604)  albuterol  (PROVENTIL ) (2.5 MG/3ML) 0.083% nebulizer solution 5 mg (5 mg Nebulization Given 08/07/24 1020)  sodium chloride  0.9 % bolus 1,000 mL (0 mLs Intravenous Stopped 08/07/24 1042)  methylPREDNISolone  sodium succinate (SOLU-MEDROL ) 125 mg/2 mL injection 125 mg (125 mg Intravenous Given 08/07/24 1009)  piperacillin -tazobactam (ZOSYN ) IVPB 3.375 g (0 g Intravenous Stopped 08/07/24 1041)  vancomycin  (VANCOREADY) IVPB 1750 mg/350 mL (0 mg Intravenous Stopped 08/07/24 1243)  fentaNYL  (SUBLIMAZE ) injection 25 mcg (25 mcg Intravenous Given 08/07/24 1128)  sodium chloride  0.9 % bolus 1,000 mL (1,000 mLs Intravenous New Bag/Given 08/07/24 1312)  magnesium  sulfate IVPB 2 g 50 mL (2 g Intravenous New Bag/Given 08/07/24 1454)     IMPRESSION / MDM / ASSESSMENT AND PLAN / ED COURSE  I reviewed the triage vital signs and the nursing notes.                              DDX/MDM/AP: Differential diagnosis includes, but is not limited to, asthma/COPD exacerbation, high concern for sepsis given hypothermia and notable hypoxia here, specific concern for recurrent aspiration pneumonia.  Overall suspect infectious and/or metabolic encephalopathy though consider possibility of acute intracranial pathology.  Consider UTI, consider uremia given reported recent hydroureteronephrosis bilaterally though Foley  does appear to be draining appropriately.  Consider possibility of PE though less likely given patient is already anticoagulated, will defer contrast imaging at this time and follow-up labs prior to determination of further imaging.  Plan: - Labs - EKG - Chest x-ray - Albuterol  (has reported allergy to Tiotropium and has not received DuoNeb previously per my initial chart review), IV steroids, BiPAP - IV fluid, will start with reduced modified fluid bolus of 1000 cc given no hypotension at this time and elderly age - Empiric broad-spectrum antibiotics - Anticipate admission  Patient's presentation is most consistent with acute presentation with potential threat to life or bodily function.  The patient is on the cardiac monitor to evaluate for evidence of arrhythmia and/or significant heart rate changes.  ED course below.  Workup with bilateral pneumonia.  Treated with broad-spectrum antibiotics.  Doing well on high flow nasal cannula, only tolerated BiPAP for couple hours before agitation prevented further use.  Urinalysis with possible UTI, is from Foley catheter.  Worsening lactic acidosis.  Clinical Course as of 08/07/24 1604  Mon Aug 07, 2024  9060 Updated family, spoke w/ wife and son - confirm normal mental status yesterday - son confirms FULL CODE - Understand patient's critical condition -They are in the lobby and will come in to see patient shortly once he is situated in his room [MM]  1011 CBC with profound leukocytosis, stable anemia  Broad-spectrum antibiotics already ordered [MM]  1012 VBG with no acidosis, no hypercapnia  Is satting upper 90s on BiPAP [MM]  1028 CXR: IMPRESSION: 1. Mixed interstitial and airspace opacification in the left lower lobe and possibly hazy opacification in the right perihilar region, findings which may be due to pneumonia. Aspiration not excluded. 2. Small bilateral pleural effusions.   [MM]  1036 Labs stating they did not receive tubes  which  were sent 2 times.  Safety zone filed.  Sending again. [MM]  1052 Viral swab neg [MM]  1109 CMP with notable AKI on CKD [MM]  1112 CMP now resulted  AKI, hyperglycemia at about 500, elevated anion gap  pH borderline--will treat for DKA.  Potassium repleted IV [MM]  1112 Switching imaging to CT CAP noncon  [MM]  1113 Troponin elevated at 180, suspect type II NSTEMI, will trend [MM]  1124 Patient becoming somewhat agitated, did require that we stopped BiPAP though fortunately no hypercapnia, doing well on high flow [MM]  1124 Discussed with pharmacy, recommend deferring insulin  until potassium has had a chance to go in  Starting potassium now [MM]  1213 Pt to CT now [MM]  1223 CT head with no acute bleed on my read  Formal read pending  Urinalysis with some pyuria, unclear if bacterial, sensation or underlying UTI  Already receiving broad-spectrum antibiotics [MM]  1230 CT chest abdomen pelvis reviewed, left lower lobe pneumonia on my read, no significant hydronephrosis appreciated [MM]  1239 D/w ICU They will evaluate patient and continue to follow closely They request hospitalist abdomen and they will consult  Hospitalist consult order placed [MM]  1304 Presented to hospitalist for admission Formal CT read not commenting on any pulmonary edema  proBNP is quite elevated here  Given rising lactate will give further 1L IV fluid  [MM]    Clinical Course User Index [MM] Clarine Ozell LABOR, MD     FINAL CLINICAL IMPRESSION(S) / ED DIAGNOSES   Final diagnoses:  Altered mental status, unspecified altered mental status type  Sepsis with acute hypoxic respiratory failure and septic shock, due to unspecified organism (HCC)  AKI (acute kidney injury)     Rx / DC Orders   ED Discharge Orders     None        Note:  This document was prepared using Dragon voice recognition software and may include unintentional dictation errors.   Clarine Ozell LABOR, MD 08/07/24  1604  "

## 2024-08-07 NOTE — H&P (Addendum)
 " History and Physical    Timothy Mclean FMW:[id number] DOB: [birth year] DOA: 08/07/2024  DOS: the patient was seen and examined on 08/07/2024  PCP: Windy Coy, MD   Patient coming from: SNF  I have personally briefly reviewed patient's old medical records in [facility] and Care[facility]where  HPI:   Timothy Mclean is a 84 y.o. year old male with medical history of hypertension, hyperlipidemia, type 2 diabetes, atrial fibrillation, CHF (EF 65% in 04/2021), CAD status post CABG, COPD presenting to the ED with altered mental status.  Patient resides at [facility].  Patient was found to have significant hypoxia with patient being 62% on room air.  He was placed on nonrebreather and transported to ED for further care.  Patient patient has dementia at baseline that he states is severe.  He was at his baseline self yesterday morning and ate breakfast.  His confusion has worsened since going to [facility] as he does not do well away from home.  When they saw him today he was unresponsive.  They stated he has been having some cough chronically but no mucus production and no other complaints per patient.  On arrival to the ED patient was noted to be HDS stable.  Lab work and imaging obtained.  CBC with severe leukocytosis at 37.1k, normal hemoglobin.  CMP with mild hypernatremia, moderate hypochloremia, significant hyperglycemia and notable AKI along with anion gap metabolic acidosis.  Troponin was elevated at 180 but downtrending to 150.  BHB mildly elevated at 1.73.  UA with signs of UTI.  Urine culture pending.  Blood cultures obtained and patient started on IV antibiotics.  Given patient's lactic acid was increasing despite ED interventions, PCCM consulted.  They will see patient in consultation but recommends hospitalist admission, given this TRH consulted for admission.   Review of Systems: unable to review all systems due to the inability of the patient to answer questions.   Past  Medical History:  Diagnosis Date   Allergy    Anxiety    Asthma    since 2013   Barrett's esophagus 03/09/2014   Bilateral lumbar radiculopathy    since 2013   CAD S/P percutaneous coronary angioplasty 1987   a. 1987 angioplasty and BMS- Cx; b. 1997 CABG x 4;  c. MYOVIEW  1/'05: ? Ischemia vs. artifact --> sent for cath - patent grafts.   Chronic atrial fibrillation (HCC)    a. Prev on amiodarone -->d/c 08/2016 in setting of recurrent AF, LFT abnormalities, and pulmonary symptoms;  b. CHA2DS2VASc = 4-->eliquis .   Chronic bronchitis (HCC)    since 2007   COPD (chronic obstructive pulmonary disease) (HCC)    DDD (degenerative disc disease)    Diverticulosis    Dyslipidemia, goal LDL below 70    Doing better off of Zocor, currently on Lescol    ED (erectile dysfunction)    Elevated LFTs    Esophageal reflux    GERD (gastroesophageal reflux disease)    Gout, unspecified    Hearing loss    Helicobacter pylori gastritis 07/2009   Herpes zoster 2009   Hypothyroidism    since 2010   Iron deficiency anemia, unspecified    Leg weakness    a. 09/2016 ABI's: R 1.05, L 0.98.   Other and unspecified hyperlipidemia    Other B-complex deficiencies    Personal history of colonic polyps    Prostatitis    chronic ulcerative   Pulmonary fibrosis (HCC)    and bronchiectasis, since 2015  Renal stone    Right   S/P CABG x 4 1997   a) LIMA-LAD, SVG to OM, SVG-dRCA-RPL; b) FALSE + MYOVIEW  --> CATH 1/'05: 100% LAD after widelly patent D1, all Cx OM branches 100%, pRCA 100%, SVG-PDA patent w/ retrograde filling of RPL, SVG-OM widely patent ~ normal OM, LIMA-LAD patent.   Spinal stenosis    ST elevation myocardial infarction (STEMI) of inferior wall, subsequent episode of care (HCC) 1987, 1997   a) PTCA of Cx; b) CABG    Type II or unspecified type diabetes mellitus without mention of complication, not stated as uncontrolled    no meds   Ulcerative colitis, unspecified    Unspecified essential  hypertension    Vitamin D  deficiency     Past Surgical History:  Procedure Laterality Date   BLEPHAROPLASTY Bilateral    for ptosis   CARDIAC CATHETERIZATION  08/17/2003   False positive stress test  grafts patent; RCA proximal 100% LAD 100% occlusion after normal D1 with 80% ostial as SP1; circumflex patent but OM1 on to all occluded; EF 50-55%   CARDIAC CATHETERIZATION  11/1995   Preop CABG: LAD 90% at D1, circumflex 100 and OM a 90% proximal, 80% distal   Carotid Duplex Doppler  12/23/2009   Right&Left ICAs 0-49%, mildly abnormal study,    CHOLECYSTECTOMY     COLONOSCOPY     CORONARY ARTERY BYPASS GRAFT  11/1995   LIMA-LAD, SVG to OM, SVG-dRCA-RPL   ESOPHAGOGASTRODUODENOSCOPY     FLEXIBLE SIGMOIDOSCOPY     INTRAMEDULLARY (IM) NAIL INTERTROCHANTERIC Right 07/27/2024   Procedure: FIXATION, FRACTURE, INTERTROCHANTERIC, WITH INTRAMEDULLARY ROD RIGHT HIP;  Surgeon: Celena Sharper, MD;  Location: [facility] OR;  Service: Orthopedics;  Laterality: Right;   NM MYOCAR PERF EJECTION FRACTION  07/03/2003   FALSE POSITIVE TEST; Bruce protocol, negative test with scintigraphic evidence of inferoapical scar, diaphragmatic attenuation, moderate ischemia.   NM MYOVIEW  LTD  2005   Questionable ischemia that is likely either infarct versus artifact; normal   TRANSTHORACIC ECHOCARDIOGRAM  12/2017   EF 55-60%.  Unable to assess diastolic function due to A. fib.  Moderate LA/RA dilation.  Mild MR.     Allergies[1]  Family History  Problem Relation Age of Onset   Diabetes Mother    Congestive Heart Failure Mother    Dementia Father    Pneumonia Father        died of this at age 33, hx of bronchiectasis   Heart disease Sister    Diabetes Sister    Lung cancer Brother        died at age 55   Hypertension Sister    CVA Sister    Colon cancer Neg Hx    Esophageal cancer Neg Hx    Rectal cancer Neg Hx    Stomach cancer Neg Hx     Prior to Admission medications  Medication Sig Start Date End Date  Taking? Authorizing Provider  albuterol  (VENTOLIN  HFA) 108 (90 Base) MCG/ACT inhaler Inhale 2 puffs into the lungs every 4 (four) hours as needed for chest congestion, cough, shortness of breath, or wheezing 09/16/23  Yes   apixaban  (ELIQUIS ) 5 MG TABS tablet Take 1 tablet (5 mg total) by mouth 2 (two) times daily. 05/24/24  Yes Anner Alm ORN, MD  [Paused] Cholecalciferol  (VITAMIN D ) 50 MCG (2000 UT) CAPS Take 1,000 Units by mouth daily. Wait to take this until: August 20, 2024   Yes [provider]  cyanocobalamin  1000 MCG tablet Take 1,000  mcg by mouth at bedtime.   Yes [provider]  diltiazem  (CARDIZEM  CD) 120 MG 24 hr capsule Take 1 capsule (120 mg total) by mouth in the morning. 09/16/23  Yes   diltiazem  (CARDIZEM ) 60 MG tablet Take 60 mg by mouth 2 (two) times daily.   Yes [provider]  empagliflozin  (JARDIANCE ) 25 MG TABS tablet Take 1 tablet (25 mg total) by mouth every morning. 04/18/24  Yes   EPINEPHrine  (EPIPEN  2-PAK) 0.3 mg/0.3 mL IJ SOAJ injection Inject 0.3 mg into the muscle as needed for allergic reaction 09/16/23  Yes   esomeprazole (NEXIUM) 20 MG packet Take 20 mg by mouth daily before breakfast.   Yes [provider]  ezetimibe  (ZETIA ) 10 MG tablet Take 1 tablet (10 mg total) by mouth daily. 09/16/23  Yes   finasteride  (PROSCAR ) 5 MG tablet Take 1 tablet (5 mg total) by mouth daily. Patient taking differently: Take 5 mg by mouth at bedtime. 09/16/23  Yes   fluticasone -salmeterol (ADVAIR ) 250-50 MCG/ACT AEPB Inhale 1 puff into the lungs 2 (two) times daily. 09/16/23  Yes   furosemide  (LASIX ) 40 MG tablet Take 40 mg by mouth at bedtime.   Yes [provider]  HYDROcodone -acetaminophen  (NORCO/VICODIN) 5-325 MG tablet Take 1-2 tablets by mouth every 6 (six) hours as needed for moderate pain (pain score 4-6). 08/02/24  Yes Juliane Ozell PARAS, PA-C  levothyroxine  (SYNTHROID ) 75 MCG tablet Take 1 tablet (75 mcg total) by mouth daily. 09/16/23   Yes   midodrine  (PROAMATINE ) 10 MG tablet Take 1 tablet (10 mg total) by mouth 3 (three) times daily with meals. 08/02/24  Yes Briana Elgin LABOR, MD  Multiple Vitamin (MULTIVITAMIN WITH MINERALS) TABS tablet Take 1 tablet by mouth daily. 08/03/24  Yes Briana Elgin LABOR, MD  mupirocin  ointment (BACTROBAN ) 2 % Apply 1 Application topically 2 (two) times daily. Patient taking differently: Apply 1 Application topically daily. 06/16/24  Yes Stuart Vernell Norris, PA-C  oxybutynin  (DITROPAN ) 5 MG tablet Take 1 tablet (5 mg total) by mouth 2 (two) times daily. Patient taking differently: Take 5 mg by mouth at bedtime. 09/16/23  Yes   povidone-Iodine (BETADINE) 5 % SOLN topical solution Apply 1 Application topically 3 (three) times daily as needed for wound care.   Yes [provider]  senna (SENOKOT) 8.6 MG TABS tablet Take 2 tablets (17.2 mg total) by mouth at bedtime for 5 days. 08/02/24 08/07/24 Yes Briana Elgin LABOR, MD  sulfaSALAzine (AZULFIDINE) 500 MG tablet Take 500 mg by mouth 3 (three) times daily.   Yes [provider]  balsalazide (COLAZAL ) 750 MG capsule TAKE 2 CAPSULES BY MOUTH TWICE DAILY Patient taking differently: Take 1,500 mg by mouth in the morning and at bedtime. 12/05/19   Avram Lupita BRAVO, MD  Dulaglutide  (TRULICITY ) 1.5 MG/0.5ML SOAJ Inject 1.5 mg as directed once a week. Patient taking differently: Inject 1.5 mg as directed once a week. On wednesday 04/18/24     food thickener (SIMPLYTHICK, HONEY/LEVEL 3/MODERATELY THICK,) LIQD Take 1 packet by mouth as needed. 08/02/24   Briana Elgin LABOR, MD  pantoprazole  (PROTONIX ) 40 MG tablet Take 1 tablet (40 mg total) by mouth 2 (two) times daily - 1/2-1 hour before meals 09/16/23     rosuvastatin  (CRESTOR ) 40 MG tablet Take 1 tablet (40 mg total) by mouth every evening after a meal 09/16/23     Vitamin D , Ergocalciferol , (DRISDOL ) 1.25 MG (50000 UNIT) CAPS capsule Take 1 capsule (50,000 Units total) by mouth every  7 (seven) days for 3  doses. 08/05/24 08/20/24  Briana Elgin LABOR, MD    Social History:  reports that he quit smoking about 39 years ago. His smoking use included cigarettes. He started smoking about 71 years ago. He has a 32 pack-year smoking history. He has never used smokeless tobacco. He reports that he does not drink alcohol  and does not use drugs.    Physical Exam: Vitals:   08/07/24 1022 08/07/24 1115 08/07/24 1200 08/07/24 1230  BP:   (!) 211/163 (!) 168/116  Pulse:   (!) 115 (!) 103  Resp:   (!) 22 (!) 26  Temp:      TempSrc:      SpO2: 98% 99% 99% 92%    Gen: Chronically ill-appearing elderly male HENT: Dry MM, nasal cannula in place CV: Irregularly irregular, tachycardic rate Lung: Diffuse rhonchi present Abd: No TTP, normal bowel sounds MSK: Cachectic appearance, decreased muscle bulk Neuro: alert but disoriented, moves all extremities to painful stimuli   Labs on Admission: I have personally reviewed following labs and imaging studies  CBC: Recent Labs  Lab 08/01/24 1253 08/02/24 0508 08/07/24 0949  WBC 10.9* 11.8* 37.1*  NEUTROABS 9.5* 9.9* 34.9*  HGB 9.2* 9.9* 10.6*  HCT 26.9* 28.9* 33.6*  MCV 88.8 89.2 94.1  PLT 223 264 399   Basic Metabolic Panel: Recent Labs  Lab 08/01/24 1253 08/02/24 0508 08/07/24 1005 08/07/24 1150 08/07/24 1250  NA 130* 136 149* 139 152*  K 3.4* 4.1 2.9* 2.9* 3.6  CL 98 102 107 99 108  CO2 22 23 22  15* 20*  GLUCOSE 283* 186* 498* 407* 465*  BUN 38* 36* 67* 57* 66*  CREATININE 1.24 1.14 2.13* 1.82* 2.21*  CALCIUM  8.2* 8.4* 8.5* 7.1* 8.0*   GFR: Estimated Creatinine Clearance: 25.9 mL/min (A) (by C-G formula based on SCr of 2.21 mg/dL (H)). Liver Function Tests: Recent Labs  Lab 08/07/24 1005  AST 68*  ALT 40  ALKPHOS 170*  BILITOT 1.6*  PROT 5.1*  ALBUMIN  2.7*   Recent Labs  Lab 08/07/24 1005  LIPASE 22   No results for input(s): AMMONIA in the last 168 hours. Coagulation Profile: Recent Labs  Lab 08/07/24 0949  INR 2.2*    Cardiac Enzymes: Recent Labs  Lab 08/07/24 1150  CKTOTAL 540*   BNP (last 3 results) No results for input(s): BNP in the last 8760 hours. HbA1C: No results for input(s): HGBA1C in the last 72 hours. CBG: Recent Labs  Lab 08/02/24 0822 08/02/24 1146 08/02/24 1702 08/07/24 1259 08/07/24 1334  GLUCAP 179* 208* 223* 423* 421*   Lipid Profile: No results for input(s): CHOL, HDL, LDLCALC, TRIG, CHOLHDL, LDLDIRECT in the last 72 hours. Thyroid  Function Tests: No results for input(s): TSH, T4TOTAL, FREET4, T3FREE, THYROIDAB in the last 72 hours. Anemia Panel: No results for input(s): VITAMINB12, FOLATE, FERRITIN, TIBC, IRON, RETICCTPCT in the last 72 hours. Urine analysis:    Component Value Date/Time   COLORURINE YELLOW (A) 08/07/2024 1150   APPEARANCEUR TURBID (A) 08/07/2024 1150   LABSPEC 1.021 08/07/2024 1150   PHURINE 5.0 08/07/2024 1150   GLUCOSEU >=500 (A) 08/07/2024 1150   HGBUR MODERATE (A) 08/07/2024 1150   BILIRUBINUR NEGATIVE 08/07/2024 1150   KETONESUR NEGATIVE 08/07/2024 1150   PROTEINUR NEGATIVE 08/07/2024 1150   NITRITE NEGATIVE 08/07/2024 1150   LEUKOCYTESUR LARGE (A) 08/07/2024 1150    Radiological Exams on Admission: I have personally reviewed images CT CHEST ABDOMEN PELVIS WO CONTRAST Result Date: 08/07/2024 EXAM: CT  CHEST, ABDOMEN AND PELVIS WITHOUT CONTRAST 08/07/2024 12:26:07 PM TECHNIQUE: CT of the chest, abdomen and pelvis was performed without the administration of intravenous contrast. Multiplanar reformatted images are provided for review. Automated exposure control, iterative reconstruction, and/or weight based adjustment of the mA/kV was utilized to reduce the radiation dose to as low as reasonably achievable. COMPARISON: CT of the abdomen and pelvis dated 07/26/2024. CLINICAL HISTORY: Sepsis, respiratory failure, concern for pneumonia. Also AKI on CKD, recent urinary tract obstruction. FINDINGS: CHEST:  MEDIASTINUM AND LYMPH NODES: Heart and pericardium are unremarkable. There is extensive calcific coronary artery disease present. The central airways are clear. No mediastinal, hilar or axillary lymphadenopathy. The patient is status post sternotomy. The thoracic aorta demonstrates moderate calcific atheromatous disease. LUNGS AND PLEURA: There is moderate central lobular emphysema, predominantly involving the upper lobes bilaterally. There is consolidation present dependently within the lower lobes bilaterally, significantly worse within the left lower lobe, concerning for pneumonia, possibly from aspiration. The nondependent lungs are clear. No pleural effusion. No pneumothorax. ABDOMEN AND PELVIS: LIVER: Unremarkable. GALLBLADDER AND BILE DUCTS: The patient is status post cholecystectomy. No biliary ductal dilatation. SPLEEN: No acute abnormality. PANCREAS: No acute abnormality. ADRENAL GLANDS: No acute abnormality. KIDNEYS, URETERS AND BLADDER: No stones in the kidneys or ureters. No hydronephrosis. No perinephric or periureteral stranding. A foley catheter is present within the collapsed urinary bladder. GI AND BOWEL: Stomach demonstrates no acute abnormality. There is no bowel obstruction. There is enteric contrast within the rectum and colon. REPRODUCTIVE ORGANS: No acute abnormality. PERITONEUM AND RETROPERITONEUM: No ascites. No free air. VASCULATURE: Aorta is normal in caliber. ABDOMINAL AND PELVIS LYMPH NODES: No lymphadenopathy. BONES AND SOFT TISSUES: The patient is status post orthopedic fixation of the right femoral head and neck. There are degenerative changes present throughout the thoracolumbar spine. No focal soft tissue abnormality. IMPRESSION: 1. Consolidation in the lower lobes bilaterally, significantly worse within the left lower lobe, concerning for pneumonia, possibly from aspiration. 2. Moderate centrilobular emphysema, predominantly involving the upper lobes bilaterally. Electronically  signed by: Evalene Coho MD 08/07/2024 12:58 PM EST RP Workstation: [id number]   CT HEAD WO CONTRAST ( ) Result Date: 08/07/2024 EXAM: CT HEAD WITHOUT CONTRAST 08/07/2024 12:26:07 PM TECHNIQUE: CT of the head was performed without the administration of intravenous contrast. Automated exposure control, iterative reconstruction, and/or weight based adjustment of the mA/kV was utilized to reduce the radiation dose to as low as reasonably achievable. COMPARISON: CT head 07/26/2024 CLINICAL HISTORY: AMS FINDINGS: BRAIN AND VENTRICLES: No acute hemorrhage. No evidence of acute infarct. No hydrocephalus. No extra-axial collection. No mass effect or midline shift. Atrophy and chronic microvascular ischemic change of the white matter are similar. Small left chronic parietooccipital infarct. ORBITS: No acute abnormality. SINUSES: No acute abnormality. SOFT TISSUES AND SKULL: No acute soft tissue abnormality. No skull fracture. IMPRESSION: 1. No acute intracranial abnormality. 2. Similar chronic microvascular ischemic change. Electronically signed by: Glendia Molt MD 08/07/2024 12:43 PM EST RP Workstation: [id number]   DG Chest Port 1 View Result Date: 08/07/2024 CLINICAL DATA:  Questionable sepsis. EXAM: PORTABLE CHEST 1 VIEW COMPARISON:  07/30/2024 and CT chest 01/16/2014. FINDINGS: Patient is rotated. Heart size stable. Mixed interstitial and airspace opacification in the left lower lobe. There may be mild hazy opacification in the right perihilar region. Small bilateral pleural effusions. Lungs are emphysematous. IMPRESSION: 1. Mixed interstitial and airspace opacification in the left lower lobe and possibly hazy opacification in the right perihilar region, findings which may be due to pneumonia. Aspiration not  excluded. 2. Small bilateral pleural effusions. Electronically Signed   By: Newell Eke M.D.   On: 08/07/2024 10:20    EKG: My personal interpretation of EKG shows:  pending    Assessment/Plan Principal Problem:   Severe sepsis (HCC) Active Problems:   Type II diabetes mellitus (HCC)   Essential hypertension   UC (ulcerative colitis) (HCC)   S/P CABG x 4   Permanent atrial fibrillation (HCC): CHA2DS2-VASc Score =4, On Eliquis . Diltiazem  for rate control.   Hyperlipidemia associated with type 2 diabetes mellitus (HCC)   Emphysema of lung (HCC)   BPH (benign prostatic hyperplasia)   Hypothyroidism Patient found unresponsive and hypoxic, hypothermic.  His presentation is consistent with severe sepsis from pneumonia and UTI.  Pneumonia patient be aspiration in setting of confusion.  Patient has received antibiotics with Zosyn  and vancomycin .  Will continue this.  Will get MRSA swab.  Given rising lactic acid patient will be admitted to stepdown.  Blood cultures and urine cultures ordered.  Pulmonary critical care medicine consulted.  Appreciate their recommendations.  After discussion with the family wants to change CODE STATUS to DNR DNI.  They would like to use pressor support if needed.  They are aware patient has guarded prognosis.  Lactic acidosis: Secondary to hypoxia.  Improved with improved oxygenation.  Will continue to trend.  Patient currently perfusing well.  Maps are greater than 65.  Anion gap metabolic acidosis: BHB elevated.  Patient is developing DKA.  He is on insulin  gtt.  Given the fluids needed with DKA protocol may need to change him to SSI every 4 hours.  Will discuss with critical care team as VBG is normal.  Type 2 diabetes: Insulin  as above.  Patient is on Trulicity  but will defer to outpatient setting if patient improves as this may need to be discontinued given patient's cachectic appearance.  AKI on CKD: Prerenal.  Secondary to sepsis.  Continue IVF.  Continue to monitor.  Avoid nephrotoxic agents, renally dose medications.  CHF: Previous echo with good EF.  Repeat echo pending.  Exam shows good perfusion.  Will use low rate  IVF.  Atrial fibrillation: Patient currently rate controlled.  Home medicine diltiazem  120 mg.  Will continue to monitor. He in on eliquis  for [facility]. Will continue once able. If develops RVR may need to start amiodarone .  BPH: Continue home medicine once patient is able to tolerate p.o. intake.  Ulcerative colitis: Continue home mesalamine  diet once patient able to tolerate p.o. intake.  BPH: Continue home finasteride .  Ulcerative colitis: Patient on balsalazide.  Hypothyroidism: Patient is on levothyroxine  75 mcg once patient will tolerate p.o.  GERD: Patient is on Protonix  40 mg.  Will change his PPI to IV.  Hyperlipidemia: Patient is on Crestor  and Zetia .  Restart once patient will tolerate p.o.  VTE prophylaxis:  Eliquis   Diet: NPO Code Status:  Full Code Telemetry:  Admission status: Inpatient, Step Down Unit Patient is from: Home Anticipated d/c is to: Home Anticipated d/c is in: 2-3 days   Family Communication: Updated at bedside  Consults called: PCCM   Severity of Illness: The appropriate patient status for this patient is INPATIENT. Inpatient status is judged to be reasonable and necessary in order to provide the required intensity of service to ensure the patient's safety. The patient's presenting symptoms, physical exam findings, and initial radiographic and laboratory data in the context of their chronic comorbidities is felt to place them at high risk for further clinical deterioration. Furthermore, it  is not anticipated that the patient will be medically stable for discharge from the hospital within 2 midnights of admission.   * I certify that at the point of admission it is my clinical judgment that the patient will require inpatient hospital care spanning beyond 2 midnights from the point of admission due to high intensity of service, high risk for further deterioration and high frequency of surveillance required.DEWAINE Morene Bathe, MD [facility]     [1]  Allergies Allergen Reactions   Atorvastatin Other (See Comments)    Arthralgia, myalgia   Fluvastatin Other (See Comments)    Arthralgia, myalgias   Hymenoptera Venom Preparations Other (See Comments)    Unknown    Lisinopril Cough   Metformin And Related Other (See Comments)    Anorexia, diarrhea, nausea, weight loss   Pravastatin Other (See Comments)    Arthralgia, myalgias   Simvastatin Other (See Comments)    myalgias   Spiriva Handihaler [Tiotropium Bromide] Nausea Only   "

## 2024-08-08 ENCOUNTER — Inpatient Hospital Stay (HOSPITAL_COMMUNITY)
Admit: 2024-08-08 | Discharge: 2024-08-08 | Disposition: A | Discharge status: Home / Self Care | Attending: Pulmonary Disease | Admitting: Pulmonary Disease

## 2024-08-08 DIAGNOSIS — R0603 Acute respiratory distress: Secondary | ICD-10-CM

## 2024-08-08 LAB — COMPREHENSIVE METABOLIC PANEL WITH GFR
ALT: 44 U/L (ref 0–44)
AST: 95 U/L — ABNORMAL HIGH (ref 15–41)
Albumin: 2.5 g/dL — ABNORMAL LOW (ref 3.5–5.0)
Alkaline Phosphatase: 161 U/L — ABNORMAL HIGH (ref 38–126)
Anion gap: 12 (ref 5–15)
BUN: 70 mg/dL — ABNORMAL HIGH (ref 8–23)
CO2: 22 mmol/L (ref 22–32)
Calcium: 8.5 mg/dL — ABNORMAL LOW (ref 8.9–10.3)
Chloride: 117 mmol/L — ABNORMAL HIGH (ref 98–111)
Creatinine, Ser: 2.2 mg/dL — ABNORMAL HIGH (ref 0.61–1.24)
GFR, Estimated: 29 mL/min — ABNORMAL LOW
Glucose, Bld: 152 mg/dL — ABNORMAL HIGH (ref 70–99)
Potassium: 4 mmol/L (ref 3.5–5.1)
Sodium: 151 mmol/L — ABNORMAL HIGH (ref 135–145)
Total Bilirubin: 1.2 mg/dL (ref 0.0–1.2)
Total Protein: 4.9 g/dL — ABNORMAL LOW (ref 6.5–8.1)

## 2024-08-08 LAB — CBC
HCT: 30.3 % — ABNORMAL LOW (ref 39.0–52.0)
Hemoglobin: 9.5 g/dL — ABNORMAL LOW (ref 13.0–17.0)
MCH: 29.5 pg (ref 26.0–34.0)
MCHC: 31.4 g/dL (ref 30.0–36.0)
MCV: 94.1 fL (ref 80.0–100.0)
Platelets: 320 K/uL (ref 150–400)
RBC: 3.22 MIL/uL — ABNORMAL LOW (ref 4.22–5.81)
RDW: 17.8 % — ABNORMAL HIGH (ref 11.5–15.5)
WBC: 40.1 K/uL — ABNORMAL HIGH (ref 4.0–10.5)
nRBC: 0.4 % — ABNORMAL HIGH (ref 0.0–0.2)

## 2024-08-08 LAB — BETA-HYDROXYBUTYRIC ACID: Beta-Hydroxybutyric Acid: 0.13 mmol/L (ref 0.05–0.27)

## 2024-08-08 LAB — GLUCOSE, CAPILLARY
Glucose-Capillary: 161 mg/dL — ABNORMAL HIGH (ref 70–99)
Glucose-Capillary: 167 mg/dL — ABNORMAL HIGH (ref 70–99)
Glucose-Capillary: 174 mg/dL — ABNORMAL HIGH (ref 70–99)

## 2024-08-08 LAB — BASIC METABOLIC PANEL WITH GFR
Anion gap: 16 — ABNORMAL HIGH (ref 5–15)
Anion gap: 16 — ABNORMAL HIGH (ref 5–15)
BUN: 74 mg/dL — ABNORMAL HIGH (ref 8–23)
BUN: 79 mg/dL — ABNORMAL HIGH (ref 8–23)
CO2: 22 mmol/L (ref 22–32)
CO2: 22 mmol/L (ref 22–32)
Calcium: 8.9 mg/dL (ref 8.9–10.3)
Calcium: 8.6 mg/dL — ABNORMAL LOW (ref 8.9–10.3)
Chloride: 118 mmol/L — ABNORMAL HIGH (ref 98–111)
Chloride: 118 mmol/L — ABNORMAL HIGH (ref 98–111)
Creatinine, Ser: 2.23 mg/dL — ABNORMAL HIGH (ref 0.61–1.24)
Creatinine, Ser: 2.33 mg/dL — ABNORMAL HIGH (ref 0.61–1.24)
GFR, Estimated: 28 mL/min — ABNORMAL LOW
GFR, Estimated: 27 mL/min — ABNORMAL LOW
Glucose, Bld: 170 mg/dL — ABNORMAL HIGH (ref 70–99)
Glucose, Bld: 198 mg/dL — ABNORMAL HIGH (ref 70–99)
Potassium: 3.9 mmol/L (ref 3.5–5.1)
Potassium: 4 mmol/L (ref 3.5–5.1)
Sodium: 155 mmol/L — ABNORMAL HIGH (ref 135–145)
Sodium: 156 mmol/L — ABNORMAL HIGH (ref 135–145)

## 2024-08-08 LAB — URINE CULTURE

## 2024-08-08 LAB — CBG MONITORING, ED
Glucose-Capillary: 152 mg/dL — ABNORMAL HIGH (ref 70–99)
Glucose-Capillary: 139 mg/dL — ABNORMAL HIGH (ref 70–99)
Glucose-Capillary: 165 mg/dL — ABNORMAL HIGH (ref 70–99)

## 2024-08-08 LAB — MRSA NEXT GEN BY PCR, NASAL: MRSA by PCR Next Gen: NOT DETECTED

## 2024-08-08 MED ORDER — SODIUM CHLORIDE 0.9 % IV SOLN
3.0000 g | Freq: Two times a day (BID) | INTRAVENOUS | Status: DC
  Administered 2024-08-08 – 2024-08-09 (×3): 3 g via INTRAVENOUS
  Filled 2024-08-08 (×4): qty 8

## 2024-08-08 MED ORDER — POTASSIUM CHLORIDE 10 MEQ/100ML IV SOLN
10.0000 meq | INTRAVENOUS | Status: AC
  Administered 2024-08-08 (×2): 10 meq via INTRAVENOUS
  Filled 2024-08-08 (×2): qty 100

## 2024-08-08 MED ORDER — ORAL CARE MOUTH RINSE: 15.0000 mL | OROMUCOSAL | Status: DC | PRN

## 2024-08-08 MED ORDER — INSULIN ASPART 100 UNIT/ML IJ SOLN
0.0000 [IU] | INTRAMUSCULAR | Status: DC
  Administered 2024-08-08 (×4): 3 [IU] via SUBCUTANEOUS
  Administered 2024-08-08 – 2024-08-09 (×2): 2 [IU] via SUBCUTANEOUS
  Administered 2024-08-09: 3 [IU] via SUBCUTANEOUS
  Administered 2024-08-09: 2 [IU] via SUBCUTANEOUS
  Filled 2024-08-08: qty 2
  Filled 2024-08-08 (×2): qty 3
  Filled 2024-08-08: qty 2
  Filled 2024-08-08: qty 3
  Filled 2024-08-08: qty 2
  Filled 2024-08-08 (×2): qty 3

## 2024-08-08 MED ORDER — INSULIN GLARGINE-YFGN 100 UNIT/ML ~~LOC~~ SOLN
7.0000 [IU] | Freq: Every day | SUBCUTANEOUS | Status: DC
  Administered 2024-08-08 – 2024-08-09 (×2): 7 [IU] via SUBCUTANEOUS
  Filled 2024-08-08 (×2): qty 0.07

## 2024-08-08 MED ORDER — HALOPERIDOL LACTATE 5 MG/ML IJ SOLN
2.0000 mg | Freq: Once | INTRAMUSCULAR | Status: AC
  Administered 2024-08-08: 2 mg via INTRAVENOUS
  Filled 2024-08-08: qty 1

## 2024-08-08 MED ORDER — DEXTROSE 5 % IV SOLN: INTRAVENOUS | Status: DC

## 2024-08-08 MED ORDER — OLANZAPINE 10 MG IM SOLR
5.0000 mg | Freq: Once | INTRAMUSCULAR | Status: AC
  Administered 2024-08-08: 5 mg via INTRAMUSCULAR
  Filled 2024-08-08: qty 10

## 2024-08-08 MED ORDER — MORPHINE SULFATE (PF) 2 MG/ML IV SOLN
1.0000 mg | INTRAVENOUS | Status: DC | PRN
  Administered 2024-08-08 – 2024-08-09 (×2): 1 mg via INTRAVENOUS
  Filled 2024-08-08 (×2): qty 1

## 2024-08-08 MED ORDER — HALOPERIDOL LACTATE 5 MG/ML IJ SOLN
1.0000 mg | Freq: Four times a day (QID) | INTRAMUSCULAR | Status: DC | PRN
  Administered 2024-08-08: 1 mg via INTRAVENOUS
  Filled 2024-08-08: qty 1

## 2024-08-08 MED ORDER — SODIUM CHLORIDE 0.45 % IV SOLN: INTRAVENOUS | Status: DC

## 2024-08-08 MED ORDER — STERILE WATER FOR INJECTION IJ SOLN
INTRAMUSCULAR | Status: AC
  Filled 2024-08-08: qty 10

## 2024-08-08 MED ORDER — ORAL CARE MOUTH RINSE
15.0000 mL | OROMUCOSAL | Status: DC
  Administered 2024-08-08 – 2024-08-09 (×4): 15 mL via OROMUCOSAL

## 2024-08-08 NOTE — Progress Notes (Signed)
 " PROGRESS NOTE    Timothy Mclean  FMW:[id number] DOB: 06-Apr-1941 DOA: 08/07/2024 PCP: Windy Coy, MD  [id number]/[id number]-AA  LOS: 1 day   Brief hospital course:   Assessment & Plan: Timothy Mclean is a 84 y.o. year old male with medical history of hypertension, hyperlipidemia, type 2 diabetes, atrial fibrillation, CHF (EF 65% in 04/2021), CAD status post CABG, COPD presenting to the ED with altered mental status.  Patient resides at [facility].  Patient was found to have significant hypoxia with patient being 62% on room air.  He was placed on nonrebreather and transported to ED for further care.  Patient patient has dementia at baseline that he states is severe.  He was at his baseline self yesterday morning and ate breakfast.  His confusion has worsened since going to [facility] as he does not do well away from home.  When they saw him on the day of presentation he was unresponsive.    Acute hypoxemic respiratory failure 2/2 Aspiration PNA and mucus plugging --CT chest showed Consolidation in the lower lobes bilaterally and given pt's known aspiration issue, pt mostly likely has aspiration PNA/pneumonitis. --O2 requirement varied, likely due to various degree of mucus plugging.  Pt does not appear to be able to clear his own secretions.  Son said they have not ruled out intubation.  Pt transferred to stepdown due to unstable respiratory status and possible need for intubation. --switch abx from Vanc/zosyn  to Unasyn  today. --palliative care consult for goals of care discussion   Dysphagia --known aspiration risk.  SLP rec dys 2 with honey thick fluid on 07/31/24.   --keep NPO due to recurrent aspirations --palliative care consult  DKA DM2 --off insulin  gtt early this morning. --cont glargine 7u daily --BG q4h and SSI   Lactic acidosis:  --cont MIVF --trend lactic acid  AKI on CKD --likely pre-renal, from DKA and dehydration. --cont MIVF  Hypernatremia --D5@50  for 1  day --monitor Na   [facility] delirium --pt found to be agitated and not sleeping. --IV Haldol  PRN --IM zyprexa  x1 today  Recent right hip fracture s/p fixation on 07/27/24 --IV morphine  PRN for pain  Atrial fibrillation:  Patient currently rate controlled.   --resume Home medicine diltiazem  120 mg and Eliquis  once pt is able to tolerate oral intake.   BPH:  --resume home medicine once patient is able to tolerate p.o. intake.   Ulcerative colitis:  --resume home mesalamine  diet once patient able to tolerate p.o. intake.   Hypothyroidism:  --resume levothyroxine  75 mcg once patient will tolerate p.o.   Hyperlipidemia:  Patient is on Crestor  and Zetia .  Restart once patient will tolerate p.o.   DVT prophylaxis: Heparin  SQ Code Status: DNR  Family Communication: son updated at bedside today Level of care: Stepdown Dispo:   The patient is from: SNF Anticipated d/c is to: SNF Anticipated d/c date is: > 3 days   Subjective and Interval History:  Pt was on room air in ED, but upon moving to 1C, O2 sats started dropping, and pt was put on non-rebreather and transferred to stepdown.  Son at bedside said intubation has not been ruled out.    RN reported pt being agitated, and moaned with moved.   Objective: Vitals:   08/08/24 1300 08/08/24 1400 08/08/24 1700 08/08/24 1800  BP: (!) 120/102 (!) 117/106 103/83 99/80  Pulse: (!) 53 77 (!) 107 (!) 105  Resp: (!) 22 (!) 35 (!) 24 (!) 33  Temp: 98.7  F (37.1 C)     TempSrc: Axillary     SpO2: 100% 100% 97% 97%    Intake/Output Summary (Last 24 hours) at 08/08/2024 2022 Last data filed at 08/08/2024 1900 Gross per 24 hour  Intake 1932.65 ml  Output 1400 ml  Net 532.65 ml   There were no vitals filed for this visit.  Examination:   Constitutional: NAD, lethargic, not oriented HEENT: conjunctivae and lids normal, EOMI CV: No cyanosis.   RESP: mucusy, rattling breath sounds, on non-rebreather SKIN: warm,  dry   Data Reviewed: I have personally reviewed labs and imaging studies  Time spent: 60 minutes  Ellouise Haber, MD [facility] If 7PM-7AM, please contact night-coverage 08/08/2024, 8:22 PM   "

## 2024-08-08 NOTE — Progress Notes (Signed)
 Pt received from ED on room air. O2 sats in the 70s. Pt was started on 2L [place] but sats did not improve. Placed on nonrebreather 10L with sats minimally improved to high 80s. HR also noted to be jumping around from low 20s to 100s. BP 70/59, respirations 34. Dr. Awanda was paged to bedside to reevaluate pt. After discussion with son who is at bedside, decision was made to transfer pt to higher level of care.

## 2024-08-08 NOTE — ED Notes (Signed)
 Notified provider of pt's restlessness. Pt's wife advised pt has not slept all night. Night RN applied mittens to pt's hands due to pt trying to pull IV's all night.

## 2024-08-08 NOTE — Plan of Care (Signed)

## 2024-08-08 NOTE — Progress Notes (Signed)
 OT Cancellation Note  Patient Details Name: Timothy Mclean MRN: [id number] DOB: [birth year]   Cancelled Treatment:    Reason Eval/Treat Not Completed: Medical issues which prohibited therapy. Received OT order and reviewed chart. Per pt's RN, patient is not medically ready for participating in rehab services at this time. Will continue to follow and evaluate when pt is medically appropriate.    Suzen Hock 08/08/2024, 12:37 PM

## 2024-08-08 NOTE — Progress Notes (Signed)
 "  NAME:  ANGAS ISABELL, MRN:  [id number], DOB:  1941-04-30, LOS: 1 ADMISSION DATE:  08/07/2024, CONSULTATION DATE:  08/07/2024 REFERRING MD:  Dr. Clarine, CHIEF COMPLAINT:  AMS, Shortness of breath, Hypoxia    Brief Pt Description / Synopsis:  84 y.o. male admitted with Severe Sepsis due to Pneumonia (Aspiration vs HCAP) and UTI, along with AKI, Rhabdomyolysis, DKA, and Acute Metabolic Encephalopathy.   History of Present Illness:  Royal Vandevoort is a 84 y.o. male with a past medical significant for dementia, asthma, COPD, atrial fibrillation on Eliquis , CAD s/p STEMI and CABG, CKD stage IIIa, diabetes mellitus type 2, and GERD who presented to [facility] ED from [facility] as family reported he was not acting himself this morning at the facility.  They report yesterday he acted as if he didn't feel good, but was cognitively at his baseline.  But this morning they report he was unresponsive.  They deny any known chest pain, abdominal pain, nausea, vomiting, diarrhea, dysuria, fever, or chills.   Upon EMS arrival he was noted to be short of breath and hypoxic with O2 saturations of 62% on room air and to be altered.    Of note he was recently admitted at [facility] from 07/26/24 throught 08/02/24 for Right hip fracture of which he underwent intramedullary nailing on 07/27/24.  Course was complicated by aspiration pneumonia due to dysphagia and AKI due to bilateral hydroureteronephrosis requiring foley catheter placement.   ED Course: Initial Vital Signs: Temperature 96.3 F, pulse 110, respiratory 25, blood pressure 109/58, SpO2 95% on nonrebreather Significant Labs: Sodium 149, potassium 2.9, glucose 498, BUN 67, creatinine 2.1, alkaline phosphatase 170, albumin  2.7, AST 68, total bilirubin 1.6, high-sensitivity troponin 180, lactic acid 6.0, WBC 37.1, hemoglobin 10.6, hematocrit 94.1, INR 2.2, beta hydroxybutyrate acid 1.73 COVID/flu/RSV PCR is negative Urinalysis consistent with UTI Imaging Chest  X-ray>>IMPRESSION: 1. Mixed interstitial and airspace opacification in the left lower lobe and possibly hazy opacification in the right perihilar region, findings which may be due to pneumonia. Aspiration not excluded. 2. Small bilateral pleural effusions. CT Head wo Contrast>>IMPRESSION: 1. No acute intracranial abnormality. 2. Similar chronic microvascular ischemic change. CT Chest/Abdomen/Pelvis>>IMPRESSION: 1. Consolidation in the lower lobes bilaterally, significantly worse within the left lower lobe, concerning for pneumonia, possibly from aspiration. 2. Moderate centrilobular emphysema, predominantly involving the upper lobes bilaterally. Medications Administered: 1 L normal saline bolus (reduced IV fluids given due to no hypotension and elevated BNP), IV Zosyn  and vancomycin   He met sepsis criteria therefore he was given IV fluids and broad-spectrum antibiotics, cultures were sent.  [facility] asked to admit for further workup and treatment.  Please see Significant Hospital Events section below for full detailed hospital course.   Pertinent  Medical History   Past Medical History:  Diagnosis Date   Allergy    Anxiety    Asthma    since 2013   Barrett's esophagus 03/09/2014   Bilateral lumbar radiculopathy    since 2013   CAD S/P percutaneous coronary angioplasty 1987   a. 1987 angioplasty and BMS- Cx; b. 1997 CABG x 4;  c. MYOVIEW  1/'05: ? Ischemia vs. artifact --> sent for cath - patent grafts.   Chronic atrial fibrillation (HCC)    a. Prev on amiodarone -->d/c 08/2016 in setting of recurrent AF, LFT abnormalities, and pulmonary symptoms;  b. CHA2DS2VASc = 4-->eliquis .   Chronic bronchitis (HCC)    since 2007   COPD (chronic obstructive pulmonary disease) (HCC)    DDD (degenerative disc disease)  Diverticulosis    Dyslipidemia, goal LDL below 70    Doing better off of Zocor, currently on Lescol    ED (erectile dysfunction)    Elevated LFTs    Esophageal reflux     GERD (gastroesophageal reflux disease)    Gout, unspecified    Hearing loss    Helicobacter pylori gastritis 07/2009   Herpes zoster 2009   Hypothyroidism    since 2010   Iron deficiency anemia, unspecified    Leg weakness    a. 09/2016 ABI's: R 1.05, L 0.98.   Other and unspecified hyperlipidemia    Other B-complex deficiencies    Personal history of colonic polyps    Prostatitis    chronic ulcerative   Pulmonary fibrosis (HCC)    and bronchiectasis, since 2015   Renal stone    Right   S/P CABG x 4 1997   a) LIMA-LAD, SVG to OM, SVG-dRCA-RPL; b) FALSE + MYOVIEW  --> CATH 1/'05: 100% LAD after widelly patent D1, all Cx OM branches 100%, pRCA 100%, SVG-PDA patent w/ retrograde filling of RPL, SVG-OM widely patent ~ normal OM, LIMA-LAD patent.   Spinal stenosis    ST elevation myocardial infarction (STEMI) of inferior wall, subsequent episode of care (HCC) 1987, 1997   a) PTCA of Cx; b) CABG    Type II or unspecified type diabetes mellitus without mention of complication, not stated as uncontrolled    no meds   Ulcerative colitis, unspecified    Unspecified essential hypertension    Vitamin D  deficiency     Micro Data:  1/19: COVID/FLU RSV PCR>> negative 1/19: Blood cultures x2>> 1/19: Urine>> 1/19: Strep pneumo urinary antigen>> 1/19; Legionella urinary antigen>>  Antimicrobials:   Anti-infectives (From admission, onward)    Start     Dose/Rate Route Frequency Ordered Stop   08/08/24 1200  Ampicillin -Sulbactam (UNASYN ) 3 g in sodium chloride  0.9 % 100 mL IVPB        3 g 200 mL/hr over 30 Minutes Intravenous Every 12 hours 08/08/24 0929     08/07/24 1900  piperacillin -tazobactam (ZOSYN ) IVPB 3.375 g  Status:  Discontinued        3.375 g 12.5 mL/hr over 240 Minutes Intravenous Every 8 hours 08/07/24 1302 08/08/24 0927   08/07/24 1312  vancomycin  variable dose per unstable renal function (pharmacist dosing)  Status:  Discontinued         Does not apply See admin  instructions 08/07/24 1312 08/08/24 0927   08/07/24 0945  vancomycin  (VANCOREADY) IVPB 1750 mg/350 mL        1,750 mg 175 mL/hr over 120 Minutes Intravenous  Once 08/07/24 0937 08/07/24 1243   08/07/24 0930  piperacillin -tazobactam (ZOSYN ) IVPB 3.375 g        3.375 g 100 mL/hr over 30 Minutes Intravenous  Once 08/07/24 0923 08/07/24 1041       Significant Hospital Events: Including procedures, antibiotic start and stop dates in addition to other pertinent events   1/19: Presented to ED with altered mental status, hypoxia, acute respiratory distress.  Being admitted by [facility] for sepsis due to pneumonia and UTI, DKA, AKI, acute metabolic encephalopathy.  PCCM consulted due to to multiorgan failure and high risk for decompensation.  CODE STATUS changed to DNR/DNI.  Interim History / Subjective:  As outlined above under Significant Hospital Events section  Objective   Blood pressure (!) 117/106, pulse 77, temperature 98.7 F (37.1 C), temperature source Axillary, resp. rate (!) 35, SpO2 100%.  Intake/Output Summary (Last 24 hours) at 08/08/2024 1643 Last data filed at 08/08/2024 1400 Gross per 24 hour  Intake 1537.77 ml  Output 600 ml  Net 937.77 ml   There were no vitals filed for this visit.  Examination: General: Critically ill on chronically ill-appearing elderly male, laying in bed, on nasal cannula, in no acute distress HENT: Atraumatic, no cephalic, neck supple, no JVD, dry mucous membranes Lungs: Coarse breath sounds throughout, even, tachypneic, mild accessory muscle use Cardiovascular: Tachycardia, regular rhythm, S1-S2, no murmurs, rubs, gallops Abdomen: Limited abdominal exam due to altered mental status: Soft, nontender, nondistended, no guarding over tenderness, bowel sounds positive x 4 Extremities: No deformities, no edema, no cyanosis Neuro: Awake but altered, does not follow commands, moves some extremities purposefully, PERRLA GU: Deferred  Resolved  Hospital Problem list     Assessment & Plan:   #Meets SIRS Criteria on presentation (RR 24, pulse 109, WBC 37.1, Temperature 96.3 F,) #Severe Sepsis due to ... #Pneumonia: Aspiration vs HCAP #UTI PMHx: recent aspiration pneumonia -Monitor fever curve -Trend WBC's & Procalcitonin -Follow cultures as above -Continue empiric Vancomycin  and Zosyn  pending cultures & sensitivities  #Acute Hypoxic Respiratory Failure due to Pneumonia and multiple metabolic derangements #COPD without acute exacerbation PMHx: Asthma, Pulmonary Fibrosis  -Supplemental O2 as needed to maintain O2 sats 88 to 92% -BiPAP is poor option given high aspiration risk (now DNR/DNI) -Follow intermittent Chest X-ray & ABG as needed -Bronchodilators  -IV Steroids -ABX as above -Diuresis as BP and renal function permits -Pulmonary toilet as able  #Elevated Troponin in setting of demand ischemia #Atrial Fibrillation, currently rate controlled #Hypertension  PMHx: Atrial Fibrillation on Eliquis , CAD s/p STEMI and CABG, orthostatic hypotension, hyperlipidemia  -Continuous cardiac monitoring -Maintain MAP >65 -Gentle IV fluids -Vasopressors as needed to maintain MAP goal ~ not requiring currently  -Trend lactic acid until normalized -HS Troponin peaked at 180 -Echocardiogram pending  #Acute Kidney Injury on CKD Stage IIIa #Rhabdomyolysis  #Anion Gap Metabolic Acidosis due to Lactic Acidosis and DKA #Hypokalemia PMHx: recent Bilateral Hydroureteronephrosis requiring foley catheter -Monitor I&O's / urinary output -Follow BMP -Ensure adequate renal perfusion -Avoid nephrotoxic agents as able -Replace electrolytes as indicated ~ Pharmacy following for assistance with electrolyte replacement -IV fluids -Continue foley catheter  -Trend lactic   #DKA #Diabetes Mellitus Type II #Hypothyroidism -Follow DKA Protocol -IV fluids -Insulin  drip -Follow BMP q4h & Beta-Hydroxybutyric acid q8h -Once Anion Gap is  closed and serum Bicarb >20, can convert to SSI & Basal insulin  -Diabetes Coordinator consulted, appreciate input -Check Hgb A1c -Resume home Synthroid  once able to tolerate PO  #Elevated LFT's -CT Abdomen & Pelvis without acute intraabdominal process -Trend LFT's and coags   #Acute Metabolic Encephalopathy #Dementia  #Hypothyroidism -CT Head negative for acute intracranial abnormality  -Treatment of metabolic derangements as outlined above -Provide supportive care -Promote normal sleep/wake cycle and family presence -Avoid sedating medications as able -Check TSH and Urine drug screen   #Recent Right Hip Fracture s/p intramedullary nailing on 07/27/24 -PT/OT -Follow with Orthopedics outpatient      Pt is critically ill with multiorgan failure. Prognosis is guarded, high risk for further decompensation, cardiac arrest, and death.  Given current critical illness superimposed on multiple chronic co-morbidities and advanced age, overall long term prognosis is poor.  Now is DNR/DNI status.  Consider consulting Palliative Care to assist with GOC discussions.   Best Practice (right click and Reselect all SmartList Selections daily)   Diet/type: NPO DVT prophylaxis: prophylactic heparin   GI prophylaxis: PPI Lines: N/A Foley:  Yes, and it is still needed Code Status:  DNR Last date of multidisciplinary goals of care discussion [N/A]  1/19: Pt's wife and son updated at bedside on plan of care.  Labs   CBC: Recent Labs  Lab 08/02/24 0508 08/07/24 0949 08/08/24 0414  WBC 11.8* 37.1* 40.1*  NEUTROABS 9.9* 34.9*  --   HGB 9.9* 10.6* 9.5*  HCT 28.9* 33.6* 30.3*  MCV 89.2 94.1 94.1  PLT 264 399 320    Basic Metabolic Panel: Recent Labs  Lab 08/07/24 1250 08/07/24 1656 08/07/24 2056 08/08/24 0414 08/08/24 1050 08/08/24 1508  NA 152*  --  152* 151* 155* 156*  K 3.6  --  3.4* 4.0 3.9 4.0  CL 108  --  116* 117* 118* 118*  CO2 20*  --  22 22 22 22   GLUCOSE 465*  --   179* 152* 170* 198*  BUN 66*  --  68* 70* 74* 79*  CREATININE 2.21*  --  2.11* 2.20* 2.23* 2.33*  CALCIUM  8.0*  --  8.5* 8.5* 8.9 8.6*  MG  --  2.7*  --   --   --   --    GFR: Estimated Creatinine Clearance: 24.5 mL/min (A) (by C-G formula based on SCr of 2.33 mg/dL (H)). Recent Labs  Lab 08/02/24 0508 08/07/24 0949 08/07/24 1150 08/07/24 2056 08/08/24 0414  WBC 11.8* 37.1*  --   --  40.1*  LATICACIDVEN  --  6.0* 7.3* 3.6*  --     Liver Function Tests: Recent Labs  Lab 08/07/24 1005 08/08/24 0414  AST 68* 95*  ALT 40 44  ALKPHOS 170* 161*  BILITOT 1.6* 1.2  PROT 5.1* 4.9*  ALBUMIN  2.7* 2.5*   Recent Labs  Lab 08/07/24 1005  LIPASE 22   No results for input(s): AMMONIA in the last 168 hours.  ABG    Component Value Date/Time   PHART 7.378 04/27/2021 0239   PCO2ART 32.6 04/27/2021 0239   PO2ART 110 (H) 04/27/2021 0239   HCO3 22.2 08/07/2024 0908   TCO2 23 07/26/2024 1520   ACIDBASEDEF 3.9 (H) 08/07/2024 0908   O2SAT 39.8 08/07/2024 0908     Coagulation Profile: Recent Labs  Lab 08/07/24 0949  INR 2.2*    Cardiac Enzymes: Recent Labs  Lab 08/07/24 1150  CKTOTAL 540*    HbA1C: Hgb A1c MFr Bld  Date/Time Value Ref Range Status  07/27/2024 01:14 PM 8.5 (H) 4.8 - 5.6 % Final    Comment:    (NOTE) Diagnosis of Diabetes The following HbA1c ranges recommended by the American Diabetes Association (ADA) may be used as an aid in the diagnosis of diabetes mellitus.  Hemoglobin             Suggested A1C NGSP%              Diagnosis  <5.7                   Non Diabetic  5.7-6.4                Pre-Diabetic  >6.4                   Diabetic  <7.0                   Glycemic control for  adults with diabetes.    04/26/2021 03:01 PM 7.7 (H) 4.8 - 5.6 % Final    Comment:    (NOTE) Pre diabetes:          5.7%-6.4%  Diabetes:              >6.4%  Glycemic control for   <7.0% adults with diabetes     CBG: Recent Labs   Lab 08/07/24 2330 08/08/24 0139 08/08/24 0344 08/08/24 0723 08/08/24 1229  GLUCAP 132* 152* 139* 165* 167*    Review of Systems:   Unable to assess due to AMS   Past Medical History:  He,  has a past medical history of Allergy, Anxiety, Asthma, Barrett's esophagus (03/09/2014), Bilateral lumbar radiculopathy, CAD S/P percutaneous coronary angioplasty (1987), Chronic atrial fibrillation (HCC), Chronic bronchitis (HCC), COPD (chronic obstructive pulmonary disease) (HCC), DDD (degenerative disc disease), Diverticulosis, Dyslipidemia, goal LDL below 70, ED (erectile dysfunction), Elevated LFTs, Esophageal reflux, GERD (gastroesophageal reflux disease), Gout, unspecified, Hearing loss, Helicobacter pylori gastritis (07/2009), Herpes zoster (2009), Hypothyroidism, Iron deficiency anemia, unspecified, Leg weakness, Other and unspecified hyperlipidemia, Other B-complex deficiencies, Personal history of colonic polyps, Prostatitis, Pulmonary fibrosis (HCC), Renal stone, S/P CABG x 4 (1997), Spinal stenosis, ST elevation myocardial infarction (STEMI) of inferior wall, subsequent episode of care (HCC) (1987, 1997), Type II or unspecified type diabetes mellitus without mention of complication, not stated as uncontrolled, Ulcerative colitis, unspecified, Unspecified essential hypertension, and Vitamin D  deficiency.   Surgical History:   Past Surgical History:  Procedure Laterality Date   BLEPHAROPLASTY Bilateral    for ptosis   CARDIAC CATHETERIZATION  08/17/2003   False positive stress test  grafts patent; RCA proximal 100% LAD 100% occlusion after normal D1 with 80% ostial as SP1; circumflex patent but OM1 on to all occluded; EF 50-55%   CARDIAC CATHETERIZATION  11/1995   Preop CABG: LAD 90% at D1, circumflex 100 and OM a 90% proximal, 80% distal   Carotid Duplex Doppler  12/23/2009   Right&Left ICAs 0-49%, mildly abnormal study,    CHOLECYSTECTOMY     COLONOSCOPY     CORONARY ARTERY BYPASS  GRAFT  11/1995   LIMA-LAD, SVG to OM, SVG-dRCA-RPL   ESOPHAGOGASTRODUODENOSCOPY     FLEXIBLE SIGMOIDOSCOPY     INTRAMEDULLARY (IM) NAIL INTERTROCHANTERIC Right 07/27/2024   Procedure: FIXATION, FRACTURE, INTERTROCHANTERIC, WITH INTRAMEDULLARY ROD RIGHT HIP;  Surgeon: Celena Sharper, MD;  Location: [facility] OR;  Service: Orthopedics;  Laterality: Right;   NM MYOCAR PERF EJECTION FRACTION  07/03/2003   FALSE POSITIVE TEST; Bruce protocol, negative test with scintigraphic evidence of inferoapical scar, diaphragmatic attenuation, moderate ischemia.   NM MYOVIEW  LTD  2005   Questionable ischemia that is likely either infarct versus artifact; normal   TRANSTHORACIC ECHOCARDIOGRAM  12/2017   EF 55-60%.  Unable to assess diastolic function due to A. fib.  Moderate LA/RA dilation.  Mild MR.     Social History:   reports that he quit smoking about 39 years ago. His smoking use included cigarettes. He started smoking about 71 years ago. He has a 32 pack-year smoking history. He has never used smokeless tobacco. He reports that he does not drink alcohol  and does not use drugs.   Family History:  His family history includes CVA in his sister; Congestive Heart Failure in his mother; Dementia in his father; Diabetes in his mother and sister; Heart disease in his sister; Hypertension in his sister; Lung cancer in his brother; Pneumonia in his father. There is no history  of Colon cancer, Esophageal cancer, Rectal cancer, or Stomach cancer.   Allergies Allergies[1]   Home Medications  Prior to Admission medications  Medication Sig Start Date End Date Taking? Authorizing Provider  albuterol  (VENTOLIN  HFA) 108 (90 Base) MCG/ACT inhaler Inhale 2 puffs into the lungs every 4 (four) hours as needed for chest congestion, cough, shortness of breath, or wheezing 09/16/23  Yes   apixaban  (ELIQUIS ) 5 MG TABS tablet Take 1 tablet (5 mg total) by mouth 2 (two) times daily. 05/24/24  Yes Anner Alm ORN, MD  [Paused]  Cholecalciferol  (VITAMIN D ) 50 MCG (2000 UT) CAPS Take 1,000 Units by mouth daily. Wait to take this until: August 20, 2024   Yes [provider]  cyanocobalamin  1000 MCG tablet Take 1,000 mcg by mouth at bedtime.   Yes [provider]  diltiazem  (CARDIZEM  CD) 120 MG 24 hr capsule Take 1 capsule (120 mg total) by mouth in the morning. 09/16/23  Yes   diltiazem  (CARDIZEM ) 60 MG tablet Take 60 mg by mouth 2 (two) times daily.   Yes [provider]  empagliflozin  (JARDIANCE ) 25 MG TABS tablet Take 1 tablet (25 mg total) by mouth every morning. 04/18/24  Yes   EPINEPHrine  (EPIPEN  2-PAK) 0.3 mg/0.3 mL IJ SOAJ injection Inject 0.3 mg into the muscle as needed for allergic reaction 09/16/23  Yes   esomeprazole (NEXIUM) 20 MG packet Take 20 mg by mouth daily before breakfast.   Yes [provider]  ezetimibe  (ZETIA ) 10 MG tablet Take 1 tablet (10 mg total) by mouth daily. 09/16/23  Yes   finasteride  (PROSCAR ) 5 MG tablet Take 1 tablet (5 mg total) by mouth daily. Patient taking differently: Take 5 mg by mouth at bedtime. 09/16/23  Yes   fluticasone -salmeterol (ADVAIR ) 250-50 MCG/ACT AEPB Inhale 1 puff into the lungs 2 (two) times daily. 09/16/23  Yes   furosemide  (LASIX ) 40 MG tablet Take 40 mg by mouth at bedtime.   Yes [provider]  HYDROcodone -acetaminophen  (NORCO/VICODIN) 5-325 MG tablet Take 1-2 tablets by mouth every 6 (six) hours as needed for moderate pain (pain score 4-6). 08/02/24  Yes Juliane Ozell PARAS, PA-C  levothyroxine  (SYNTHROID ) 75 MCG tablet Take 1 tablet (75 mcg total) by mouth daily. 09/16/23  Yes   midodrine  (PROAMATINE ) 10 MG tablet Take 1 tablet (10 mg total) by mouth 3 (three) times daily with meals. 08/02/24  Yes Briana Elgin LABOR, MD  Multiple Vitamin (MULTIVITAMIN WITH MINERALS) TABS tablet Take 1 tablet by mouth daily. 08/03/24  Yes Briana Elgin LABOR, MD  mupirocin  ointment (BACTROBAN ) 2 % Apply 1 Application topically 2 (two) times  daily. Patient taking differently: Apply 1 Application topically daily. 06/16/24  Yes Stuart Vernell Norris, PA-C  oxybutynin  (DITROPAN ) 5 MG tablet Take 1 tablet (5 mg total) by mouth 2 (two) times daily. Patient taking differently: Take 5 mg by mouth at bedtime. 09/16/23  Yes   povidone-Iodine (BETADINE) 5 % SOLN topical solution Apply 1 Application topically 3 (three) times daily as needed for wound care.   Yes [provider]  senna (SENOKOT) 8.6 MG TABS tablet Take 2 tablets (17.2 mg total) by mouth at bedtime for 5 days. 08/02/24 08/07/24 Yes Briana Elgin LABOR, MD  sulfaSALAzine (AZULFIDINE) 500 MG tablet Take 500 mg by mouth 3 (three) times daily.   Yes [provider]  balsalazide (COLAZAL ) 750 MG capsule TAKE 2 CAPSULES BY MOUTH TWICE DAILY Patient taking differently: Take 1,500 mg by mouth in the morning and at  bedtime. 12/05/19   Avram Lupita BRAVO, MD  Dulaglutide  (TRULICITY ) 1.5 MG/0.5ML SOAJ Inject 1.5 mg as directed once a week. Patient taking differently: Inject 1.5 mg as directed once a week. On wednesday 04/18/24     food thickener (SIMPLYTHICK, HONEY/LEVEL 3/MODERATELY THICK,) LIQD Take 1 packet by mouth as needed. 08/02/24   Briana Elgin LABOR, MD  pantoprazole  (PROTONIX ) 40 MG tablet Take 1 tablet (40 mg total) by mouth 2 (two) times daily - 1/2-1 hour before meals 09/16/23     rosuvastatin  (CRESTOR ) 40 MG tablet Take 1 tablet (40 mg total) by mouth every evening after a meal 09/16/23     Vitamin D , Ergocalciferol , (DRISDOL ) 1.25 MG (50000 UNIT) CAPS capsule Take 1 capsule (50,000 Units total) by mouth every 7 (seven) days for 3 doses. 08/05/24 08/20/24  Briana Elgin LABOR, MD      Halina Picking, M.D.  Pulmonary & Critical Care Medicine  [facility]         [1]  Allergies Allergen Reactions   Atorvastatin Other (See Comments)    Arthralgia, myalgia   Fluvastatin Other (See Comments)    Arthralgia, myalgias   Hymenoptera Venom Preparations Other (See  Comments)    Unknown    Lisinopril Cough   Metformin And Related Other (See Comments)    Anorexia, diarrhea, nausea, weight loss   Pravastatin Other (See Comments)    Arthralgia, myalgias   Simvastatin Other (See Comments)    myalgias   Spiriva Handihaler [Tiotropium Bromide] Nausea Only   "

## 2024-08-08 NOTE — Progress Notes (Signed)
 Echocardiogram 2D Echocardiogram has been performed.  Timothy Mclean 08/08/2024, 9:34 PM

## 2024-08-08 NOTE — Progress Notes (Signed)
 PT Cancellation Note  Patient Details Name: Timothy Mclean MRN: [id number] DOB: 1941-03-27   Cancelled Treatment:    Reason Eval/Treat Not Completed: Medical issues which prohibited therapy (Consult received and chart reviewed.  Per discussion with primary RN, patient not medically ready for therapy evaluations; planning transition to step-down unit for additional monitoring. Will continue to follow and initiate as medically appropriate.)  Myrtis Maille H. Delores, PT, DPT, NCS 08/08/24, 11:18 AM [phone]

## 2024-08-08 NOTE — ED Notes (Signed)
 Pt given PRN haldol  for restlessness and agitation. Pt pulling at IV lines.

## 2024-08-08 NOTE — Inpatient Diabetes Management (Signed)
 Inpatient Diabetes Program Recommendations  AACE/ADA: New Consensus Statement on Inpatient Glycemic Control   Target Ranges:  Prepandial:   less than 140 mg/dL      Peak postprandial:   less than 180 mg/dL (1-2 hours)      Critically ill patients:  140 - 180 mg/dL    Latest Reference Range & Units 08/07/24 17:50 08/07/24 19:34 08/07/24 20:44 08/07/24 22:02 08/07/24 23:30 08/08/24 01:39 08/08/24 03:44 08/08/24 07:23  Glucose-Capillary 70 - 99 mg/dL 832 (H) 839 (H) 830 (H) 150 (H) 132 (H) 152 (H) 139 (H) 165 (H)    Latest Reference Range & Units 08/07/24 10:05 08/07/24 11:50 08/07/24 12:50 08/07/24 20:56 08/08/24 04:14  CO2 22 - 32 mmol/L 22 15 (L) 20 (L) 22 22  Glucose 70 - 99 mg/dL 501 (H) 592 (H) 534 (H) 179 (H) 152 (H)  Anion gap 5 - 15  20 (H) 26 (H) 25 (H) 14 12    Latest Reference Range & Units 08/07/24 09:49 08/07/24 11:50 08/07/24 20:56  Lactic Acid, Venous 0.5 - 1.9 mmol/L 6.0 (HH) 7.3 (HH) 3.6 (HH)   Review of Glycemic Control  Diabetes history: DM2 Outpatient Diabetes medications: Trulicity  1.5 mg Qweek (Wednesday), Jardiance  25 mg daily Current orders for Inpatient glycemic control: Semglee  7 units daily, Novolog  0-15 units Q4H  Inpatient Diabetes Program Recommendations:    Insulin : Patient was ordered IV insulin  which has been transitioned to SQ insulin . Patient received Semglee  7 units at 1:42 am today and most current CBG 165 mg/dl at 2:76 am today.   Outpatient DM: Given recent fall with hip fracture and per H&P by Dr. Fernand on 08/07/24 patient has cachectic appearance, may need to consider stopping Trulicity  outpatient. If Trulicity  is stopped, may want to discharge on insulin .    NOTE: Patient admitted from SNF with severe sepsis from pneumonia and UTI, lactic acidosis, and hyperglycemia. Noted initial lab glucose 498 mg/dl at 89:94 am and that patient received Solumedrol 125 mg at 10:09 on 08/07/24. Patient was ordered IV insulin  for hyperglycemia which has been  transitioned to SQ insulin  today. Noted patient was recently inpatient 07/26/24-08/02/24 with hip fracture following fall.  Agree with current insulin  orders. Will follow along.   Thanks, Earnie Gainer, RN, MSN, CDCES Diabetes Coordinator Inpatient Diabetes Program [phone] (Team Pager from 8am to 5pm)

## 2024-08-09 DIAGNOSIS — E1169 Type 2 diabetes mellitus with other specified complication: Secondary | ICD-10-CM

## 2024-08-09 DIAGNOSIS — Z515 Encounter for palliative care: Secondary | ICD-10-CM

## 2024-08-09 DIAGNOSIS — R6520 Severe sepsis without septic shock: Secondary | ICD-10-CM | POA: Diagnosis not present

## 2024-08-09 DIAGNOSIS — E785 Hyperlipidemia, unspecified: Secondary | ICD-10-CM | POA: Diagnosis not present

## 2024-08-09 DIAGNOSIS — N179 Acute kidney failure, unspecified: Secondary | ICD-10-CM

## 2024-08-09 DIAGNOSIS — Z7189 Other specified counseling: Secondary | ICD-10-CM

## 2024-08-09 DIAGNOSIS — J439 Emphysema, unspecified: Secondary | ICD-10-CM | POA: Diagnosis not present

## 2024-08-09 DIAGNOSIS — K5190 Ulcerative colitis, unspecified, without complications: Secondary | ICD-10-CM | POA: Diagnosis not present

## 2024-08-09 DIAGNOSIS — A419 Sepsis, unspecified organism: Secondary | ICD-10-CM | POA: Diagnosis not present

## 2024-08-09 LAB — GLUCOSE, CAPILLARY
Glucose-Capillary: 144 mg/dL — ABNORMAL HIGH (ref 70–99)
Glucose-Capillary: 130 mg/dL — ABNORMAL HIGH (ref 70–99)
Glucose-Capillary: 100 mg/dL — ABNORMAL HIGH (ref 70–99)
Glucose-Capillary: 117 mg/dL — ABNORMAL HIGH (ref 70–99)

## 2024-08-09 LAB — BASIC METABOLIC PANEL WITH GFR
Anion gap: 14 (ref 5–15)
BUN: 82 mg/dL — ABNORMAL HIGH (ref 8–23)
CO2: 23 mmol/L (ref 22–32)
Calcium: 8.9 mg/dL (ref 8.9–10.3)
Chloride: 120 mmol/L — ABNORMAL HIGH (ref 98–111)
Creatinine, Ser: 2.34 mg/dL — ABNORMAL HIGH (ref 0.61–1.24)
GFR, Estimated: 27 mL/min — ABNORMAL LOW
Glucose, Bld: 152 mg/dL — ABNORMAL HIGH (ref 70–99)
Potassium: 3.7 mmol/L (ref 3.5–5.1)
Sodium: 157 mmol/L — ABNORMAL HIGH (ref 135–145)

## 2024-08-09 LAB — MAGNESIUM: Magnesium: 3 mg/dL — ABNORMAL HIGH (ref 1.7–2.4)

## 2024-08-09 LAB — CBC
HCT: 33.5 % — ABNORMAL LOW (ref 39.0–52.0)
Hemoglobin: 10.2 g/dL — ABNORMAL LOW (ref 13.0–17.0)
MCH: 29.7 pg (ref 26.0–34.0)
MCHC: 30.4 g/dL (ref 30.0–36.0)
MCV: 97.4 fL (ref 80.0–100.0)
Platelets: 283 K/uL (ref 150–400)
RBC: 3.44 MIL/uL — ABNORMAL LOW (ref 4.22–5.81)
RDW: 18.6 % — ABNORMAL HIGH (ref 11.5–15.5)
WBC: 37.1 K/uL — ABNORMAL HIGH (ref 4.0–10.5)
nRBC: 0.2 % (ref 0.0–0.2)

## 2024-08-09 LAB — LEGIONELLA PNEUMOPHILA SEROGP 1 UR AG: L. pneumophila Serogp 1 Ur Ag: NEGATIVE

## 2024-08-09 LAB — LACTIC ACID, PLASMA
Lactic Acid, Venous: 2.8 mmol/L (ref 0.5–1.9)
Lactic Acid, Venous: 1.9 mmol/L (ref 0.5–1.9)

## 2024-08-09 LAB — THYROID PANEL WITH TSH
Free Thyroxine Index: 1.6 (ref 1.2–4.9)
T3 Uptake Ratio: 42 % — ABNORMAL HIGH (ref 24–39)
T4, Total: 3.9 ug/dL — ABNORMAL LOW (ref 4.5–12.0)
TSH: 0.235 u[IU]/mL — ABNORMAL LOW (ref 0.450–4.500)

## 2024-08-09 LAB — ECHOCARDIOGRAM COMPLETE: S' Lateral: 2.9 cm

## 2024-08-09 MED ORDER — LORAZEPAM 2 MG/ML IJ SOLN
1.0000 mg | Freq: Four times a day (QID) | INTRAMUSCULAR | Status: DC
  Administered 2024-08-09 – 2024-08-10 (×3): 1 mg via INTRAVENOUS
  Filled 2024-08-09 (×3): qty 1

## 2024-08-09 MED ORDER — ONDANSETRON HCL 4 MG/2ML IJ SOLN: 4.0000 mg | Freq: Four times a day (QID) | INTRAMUSCULAR | Status: DC | PRN

## 2024-08-09 MED ORDER — MORPHINE SULFATE (PF) 2 MG/ML IV SOLN
2.0000 mg | Freq: Four times a day (QID) | INTRAVENOUS | Status: DC
  Administered 2024-08-09 – 2024-08-10 (×4): 2 mg via INTRAVENOUS
  Filled 2024-08-09 (×4): qty 1

## 2024-08-09 MED ORDER — GLYCOPYRROLATE 0.2 MG/ML IJ SOLN: 0.2000 mg | INTRAMUSCULAR | Status: DC | PRN

## 2024-08-09 MED ORDER — DILTIAZEM LOAD VIA INFUSION
10.0000 mg | Freq: Once | INTRAVENOUS | Status: AC
  Administered 2024-08-09: 10 mg via INTRAVENOUS
  Filled 2024-08-09: qty 10

## 2024-08-09 MED ORDER — HALOPERIDOL LACTATE 5 MG/ML IJ SOLN: 1.0000 mg | INTRAMUSCULAR | Status: DC | PRN

## 2024-08-09 MED ORDER — MORPHINE SULFATE (PF) 2 MG/ML IV SOLN
1.0000 mg | INTRAVENOUS | Status: DC | PRN
  Administered 2024-08-10: 2 mg via INTRAVENOUS
  Filled 2024-08-09: qty 1

## 2024-08-09 MED ORDER — GLYCOPYRROLATE 1 MG PO TABS: 1.0000 mg | ORAL_TABLET | ORAL | Status: DC | PRN

## 2024-08-09 MED ORDER — ONDANSETRON 4 MG PO TBDP: 4.0000 mg | ORAL_TABLET | Freq: Four times a day (QID) | ORAL | Status: DC | PRN

## 2024-08-09 MED ORDER — HALOPERIDOL LACTATE 2 MG/ML PO CONC: 1.0000 mg | ORAL | Status: DC | PRN

## 2024-08-09 MED ORDER — IPRATROPIUM-ALBUTEROL 0.5-2.5 (3) MG/3ML IN SOLN
3.0000 mL | Freq: Four times a day (QID) | RESPIRATORY_TRACT | Status: DC
  Administered 2024-08-09: 3 mL via RESPIRATORY_TRACT
  Filled 2024-08-09: qty 3

## 2024-08-09 MED ORDER — HALOPERIDOL 0.5 MG PO TABS: 1.0000 mg | ORAL_TABLET | ORAL | Status: DC | PRN

## 2024-08-09 MED ORDER — BIOTENE DRY MOUTH MT LIQD: 15.0000 mL | OROMUCOSAL | Status: DC | PRN

## 2024-08-09 MED ORDER — IPRATROPIUM-ALBUTEROL 0.5-2.5 (3) MG/3ML IN SOLN: 3.0000 mL | Freq: Two times a day (BID) | RESPIRATORY_TRACT | Status: DC

## 2024-08-09 MED ORDER — DILTIAZEM HCL-DEXTROSE 125-5 MG/125ML-% IV SOLN (PREMIX)
5.0000 mg/h | INTRAVENOUS | Status: DC
  Administered 2024-08-09: 5 mg/h via INTRAVENOUS
  Filled 2024-08-09: qty 125

## 2024-08-09 MED ORDER — MIDODRINE HCL 5 MG PO TABS: 10.0000 mg | ORAL_TABLET | Freq: Three times a day (TID) | ORAL | Status: DC

## 2024-08-09 MED ORDER — LORAZEPAM 1 MG PO TABS: 1.0000 mg | ORAL_TABLET | ORAL | Status: DC | PRN

## 2024-08-09 MED ORDER — LORAZEPAM 2 MG/ML IJ SOLN
0.5000 mg | Freq: Once | INTRAMUSCULAR | Status: AC
  Administered 2024-08-09: 0.5 mg via INTRAVENOUS
  Filled 2024-08-09: qty 1

## 2024-08-09 MED ORDER — POLYVINYL ALCOHOL 1.4 % OP SOLN: 1.0000 [drp] | Freq: Four times a day (QID) | OPHTHALMIC | Status: DC | PRN

## 2024-08-09 MED ORDER — LORAZEPAM 2 MG/ML PO CONC: 1.0000 mg | ORAL | Status: DC | PRN

## 2024-08-09 MED ORDER — LORAZEPAM 2 MG/ML IJ SOLN
1.0000 mg | INTRAMUSCULAR | Status: DC | PRN
  Administered 2024-08-10: 1 mg via INTRAVENOUS
  Filled 2024-08-09: qty 1

## 2024-08-09 MED ORDER — DILTIAZEM HCL ER COATED BEADS 120 MG PO CP24
120.0000 mg | ORAL_CAPSULE | Freq: Every morning | ORAL | Status: DC
  Filled 2024-08-09: qty 1

## 2024-08-09 NOTE — Plan of Care (Signed)
  Problem: Education: Goal: Knowledge of General Education information will improve Description: Including pain rating scale, medication(s)/side effects and non-pharmacologic comfort measures Outcome: Not Progressing   Problem: Health Behavior/Discharge Planning: Goal: Ability to manage health-related needs will improve Outcome: Not Progressing   Problem: Clinical Measurements: Goal: Ability to maintain clinical measurements within normal limits will improve Outcome: Not Progressing Goal: Will remain free from infection Outcome: Not Progressing Goal: Diagnostic test results will improve Outcome: Not Progressing Goal: Respiratory complications will improve Outcome: Not Progressing Goal: Cardiovascular complication will be avoided Outcome: Not Progressing   Problem: Activity: Goal: Risk for activity intolerance will decrease Outcome: Not Progressing   Problem: Nutrition: Goal: Adequate nutrition will be maintained Outcome: Not Progressing   Problem: Coping: Goal: Level of anxiety will decrease Outcome: Not Progressing   Problem: Elimination: Goal: Will not experience complications related to bowel motility Outcome: Not Progressing Goal: Will not experience complications related to urinary retention Outcome: Not Progressing   Problem: Pain Managment: Goal: General experience of comfort will improve and/or be controlled Outcome: Not Progressing   Problem: Safety: Goal: Ability to remain free from injury will improve Outcome: Not Progressing   Problem: Skin Integrity: Goal: Risk for impaired skin integrity will decrease Outcome: Not Progressing   Problem: Education: Goal: Knowledge of the prescribed therapeutic regimen will improve Outcome: Not Progressing   Problem: Coping: Goal: Ability to identify and develop effective coping behavior will improve Outcome: Not Progressing   Problem: Clinical Measurements: Goal: Quality of life will improve Outcome: Not  Progressing   Problem: Respiratory: Goal: Verbalizations of increased ease of respirations will increase Outcome: Not Progressing   Problem: Role Relationship: Goal: Family's ability to cope with current situation will improve Outcome: Not Progressing Goal: Ability to verbalize concerns, feelings, and thoughts to partner or family member will improve Outcome: Not Progressing   Problem: Pain Management: Goal: Satisfaction with pain management regimen will improve Outcome: Not Progressing

## 2024-08-09 NOTE — Progress Notes (Signed)
 PT Cancellation Note  Patient Details Name: Timothy Mclean MRN: [id number] DOB: [birth year]   Cancelled Treatment:    Reason Eval/Treat Not Completed: Other (comment) Provider in room meeting with son on PT attempt. PT will follow up as able/appropriate.   Maryanne Finder, PT, DPT Physical Therapist - [place]  [facility]   Ravneet Spilker A Tara Rud 08/09/2024, 1:28 PM

## 2024-08-09 NOTE — Progress Notes (Signed)
 OT Cancellation Note  Patient Details Name: Timothy Mclean MRN: [id number] DOB: 1941-02-14   Cancelled Treatment:    Reason Eval/Treat Not Completed: Other (comment) (provider in room meeting with son upon OT attempt. OT will follow up as able/approrpiate.)  Therisa Sheffield, OTD OTR/L  08/09/24, 1:01 PM

## 2024-08-09 NOTE — Consult Note (Signed)
 " Consultation Note Date: 08/09/2024   Patient Name: CEDARIUS KERSH  DOB: 06-02-1941  MRN: [id number]  Age / Sex: 84 y.o., male   PCP: Windy Coy, MD Referring Physician: Leesa Kast, DO  Reason for Consultation: Establishing goals of care     Chief Complaint/History of Present Illness:  Mr. Palau is an 84 year old male with hypertension, hyperlipidemia, type 2 diabetes, A-fib, CHF, CAD status post CABG, COPD, dementia with recent hospitalization for hip fracture status post repair who presented from [facility] with hypoxia.  He was subsequently diagnosed with sepsis secondary to pneumonia thought to be from aspiration.  He also has A-fib with RVR, AKI on CKD, and elevated troponin.  He was in DKA on arrival.  Palliative consulted for goals of care.  Chart reviewed including personal review of most recent notes from critical care, hospitalist, PT and OT.  Labs reviewed and remarkable for sodium 157, chloride 120, creatinine 2.34, lactic acid 2.8, white blood count 37.1, hemoglobin 10.2.  I saw and examined Mr. Fulop today.  He was unresponsive at time my visit.  His son, Darlyn, was present at the bedside at time of initial encounter this morning.  We discussed his understanding of situation and he understands that his father remains critically ill and has recurrent aspiration.  Discussed unfixable nature of recurrent aspiration events and overall decline that they have been seeing in his nutrition, cognition, and functional status even prior to hip fracture.  We talked about options for care moving forward including continuation of aggressive interventions with hope of improvement versus consideration for transition to comfort care.  Discussed how if more aggressive care does lead to improvement, we still did not fix the underlying condition that caused aspiration in the first place and this will likely be a recurrent problem.  Reports that he and his mother have been discussing and  they really are considering that comfort care may be the best moving for his father due to irreversible nature of his decline.  I then met with Cale and patient's wife, Elveria in the afternoon.  We again reviewed his overall clinical course, continued decline over the last several months, acute decline following hip fracture, and hope for the future that he is symptomatically controlled and dies a peaceful death.  Elveria reports understanding that everything that is going on is not fixable and states that in light of the fact that he continues to be restless and agitated, she thinks that transition should be made to focus on his comfort moving forward.  We discussed symptom management for pain, anxiety, dyspnea, and excess secretions.  Reviewed options for care moving forward and decision was made to transition to full comfort care, transition from ICU, focus on his comfort, and pursue placement at residential hospice for end-of-life care.  Discussed care plan again with bedside RN who was present for above conversation.  Communicated with Dr. Dezii and comfort orders placed with comfort care order set.  Will begin with scheduled pushes and additional as needed medication in between but consider that he may be to be transition to continuous infusion at some point if needed to maintain his comfort.  Primary Diagnoses  Present on Admission:  Hyperlipidemia associated with type 2 diabetes mellitus (HCC)  UC (ulcerative colitis) (HCC)  Permanent atrial fibrillation (HCC): CHA2DS2-VASc Score =4, On Eliquis . Diltiazem  for rate control.  Essential hypertension  Emphysema of lung (HCC)  Severe sepsis (HCC)   Palliative Review of Systems: Unable to obtain secondary to mental  status  Past Medical History:  Diagnosis Date   Allergy    Anxiety    Asthma    since 2013   Barrett's esophagus 03/09/2014   Bilateral lumbar radiculopathy    since 2013   CAD S/P percutaneous coronary angioplasty 1987    a. 1987 angioplasty and BMS- Cx; b. 1997 CABG x 4;  c. MYOVIEW  1/'05: ? Ischemia vs. artifact --> sent for cath - patent grafts.   Chronic atrial fibrillation (HCC)    a. Prev on amiodarone -->d/c 08/2016 in setting of recurrent AF, LFT abnormalities, and pulmonary symptoms;  b. CHA2DS2VASc = 4-->eliquis .   Chronic bronchitis (HCC)    since 2007   COPD (chronic obstructive pulmonary disease) (HCC)    DDD (degenerative disc disease)    Diverticulosis    Dyslipidemia, goal LDL below 70    Doing better off of Zocor, currently on Lescol    ED (erectile dysfunction)    Elevated LFTs    Esophageal reflux    GERD (gastroesophageal reflux disease)    Gout, unspecified    Hearing loss    Helicobacter pylori gastritis 07/2009   Herpes zoster 2009   Hypothyroidism    since 2010   Iron deficiency anemia, unspecified    Leg weakness    a. 09/2016 ABI's: R 1.05, L 0.98.   Other and unspecified hyperlipidemia    Other B-complex deficiencies    Personal history of colonic polyps    Prostatitis    chronic ulcerative   Pulmonary fibrosis (HCC)    and bronchiectasis, since 2015   Renal stone    Right   S/P CABG x 4 1997   a) LIMA-LAD, SVG to OM, SVG-dRCA-RPL; b) FALSE + MYOVIEW  --> CATH 1/'05: 100% LAD after widelly patent D1, all Cx OM branches 100%, pRCA 100%, SVG-PDA patent w/ retrograde filling of RPL, SVG-OM widely patent ~ normal OM, LIMA-LAD patent.   Spinal stenosis    ST elevation myocardial infarction (STEMI) of inferior wall, subsequent episode of care (HCC) 1987, 1997   a) PTCA of Cx; b) CABG    Type II or unspecified type diabetes mellitus without mention of complication, not stated as uncontrolled    no meds   Ulcerative colitis, unspecified    Unspecified essential hypertension    Vitamin D  deficiency    Social History   Socioeconomic History   Marital status: Married    Spouse name: Not on file   Number of children: 1   Years of education: Not on file   Highest education  level: Not on file  Occupational History   Occupation: retired  Tobacco Use   Smoking status: Former    Current packs/day: 0.00    Average packs/day: 1 pack/day for 32.0 years (32.0 ttl pk-yrs)    Types: Cigarettes    Start date: 08/27/1952    Quit date: 08/27/1984    Years since quitting: 39.9   Smokeless tobacco: Never  Substance and Sexual Activity   Alcohol  use: No   Drug use: No   Sexual activity: Not on file  Other Topics Concern   Not on file  Social History Narrative   He is a married father of one. Does not smoke and does not drink. He walks routinely and also does stationary bike. 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[employer])   Social Drivers of Health   Tobacco Use: Medium Risk (08/07/2024)   Patient History  Smoking Tobacco Use: Former    Smokeless Tobacco Use: Never    Passive Exposure: Not on file  Financial Resource Strain: Not on file  Food Insecurity: Patient Unable To Answer (07/28/2024)   Epic    Worried About Programme Researcher, Broadcasting/film/video in the Last Year: Patient unable to answer    Ran Out of Food in the Last Year: Patient unable to answer  Transportation Needs: Patient Unable To Answer (07/28/2024)   Epic    Lack of Transportation (Medical): Patient unable to answer    Lack of Transportation (Non-Medical): Patient unable to answer  Physical Activity: Not on file  Stress: Not on file  Social Connections: Patient Unable To Answer (07/28/2024)   Social Connection and Isolation Panel    Frequency of Communication with Friends and Family: Patient unable to answer    Frequency of Social Gatherings with Friends and Family: Patient unable to answer    Attends Religious Services: Patient unable to answer    Active Member of Clubs or Organizations: Patient unable to answer    Attends Banker Meetings: Patient unable to answer    Marital Status: Patient unable to answer   Depression (PHQ2-9): Not on file  Alcohol  Screen: Not on file  Housing: Patient Unable To Answer (07/28/2024)   Epic    Unable to Pay for Housing in the Last Year: Patient unable to answer    Number of Times Moved in the Last Year: Not on file    Homeless in the Last Year: Patient unable to answer  Utilities: Patient Unable To Answer (07/28/2024)   Epic    Threatened with loss of utilities: Patient unable to answer  Health Literacy: Not on file   Family History  Problem Relation Age of Onset   Diabetes Mother    Congestive Heart Failure Mother    Dementia Father    Pneumonia Father        died of this at age 66, hx of bronchiectasis   Heart disease Sister    Diabetes Sister    Lung cancer Brother        died at age 87   Hypertension Sister    CVA Sister    Colon cancer Neg Hx    Esophageal cancer Neg Hx    Rectal cancer Neg Hx    Stomach cancer Neg Hx    Scheduled Meds:  LORazepam   1 mg Intravenous Q6H    morphine  injection  2 mg Intravenous Q6H   mouth rinse  15 mL Mouth Rinse 4 times per day   Continuous Infusions: PRN Meds:.acetaminophen  **OR** acetaminophen , antiseptic oral rinse, artificial tears, glycopyrrolate  **OR** glycopyrrolate  **OR** glycopyrrolate , haloperidol  **OR** haloperidol  **OR** haloperidol  lactate, LORazepam  **OR** LORazepam  **OR** LORazepam , morphine  injection, ondansetron  **OR** ondansetron  (ZOFRAN ) IV Allergies[1] CBC:    Component Value Date/Time   WBC 37.1 (H) 08/09/2024 0342   HGB 10.2 (L) 08/09/2024 0342   HCT 33.5 (L) 08/09/2024 0342   PLT 283 08/09/2024 0342   MCV 97.4 08/09/2024 0342   NEUTROABS 34.9 (H) 08/07/2024 0949   LYMPHSABS 0.9 08/07/2024 0949   MONOABS 0.9 08/07/2024 0949   EOSABS 0.0 08/07/2024 0949   BASOSABS 0.1 08/07/2024 0949   Comprehensive Metabolic Panel:    Component Value Date/Time   NA 157 (H) 08/09/2024 0342   NA 145 (H) 08/21/2019 1416   K 3.7 08/09/2024 0342   CL 120 (H) 08/09/2024 0342   CO2 23 08/09/2024  0342   BUN 82 (H) 08/09/2024 0342  BUN 26 08/21/2019 1416   CREATININE 2.34 (H) 08/09/2024 0342   CREATININE 1.86 (H) 10/29/2016 1125   GLUCOSE 152 (H) 08/09/2024 0342   CALCIUM  8.9 08/09/2024 0342   AST 95 (H) 08/08/2024 0414   ALT 44 08/08/2024 0414   ALKPHOS 161 (H) 08/08/2024 0414   BILITOT 1.2 08/08/2024 0414   BILITOT 0.5 08/21/2019 1416   PROT 4.9 (L) 08/08/2024 0414   PROT 7.3 08/21/2019 1416   ALBUMIN  2.5 (L) 08/08/2024 0414   ALBUMIN  5.1 (H) 08/21/2019 1416    Physical Exam: Vital Signs: BP (!) 102/59 (BP Location: Left Arm)   Pulse 67   Temp 98.7 F (37.1 C) (Axillary)   Resp 18   SpO2 95%  SpO2: SpO2: 95 % O2 Device: O2 Device: High Flow Nasal Cannula O2 Flow Rate: O2 Flow Rate (L/min): 6 L/min Intake/output summary:  Intake/Output Summary (Last 24 hours) at 08/09/2024 1618 Last data filed at 08/09/2024 1603 Gross per 24 hour  Intake 1455.31 ml  Output 1875 ml  Net -419.69 ml   LBM: Last BM Date : 08/08/24 Baseline Weight:   Most recent weight:    General: Unresponsive, reaching with some agitation Eyes: conjunctiva clear, anicteric sclera HENT: Mucous membranes dry, mouth open Cardiovascular: RRR Respiratory: Coarse breath sounds Abdomen: not distended Skin: no rashes or lesions on visible skin Neuro: Does not awake to verbal or tactile stimulation          Palliative Performance Scale: 10              Additional Data Reviewed: Recent Labs    08/08/24 0414 08/08/24 1050 08/08/24 1508 08/09/24 0342  WBC 40.1*  --   --  37.1*  HGB 9.5*  --   --  10.2*  PLT 320  --   --  283  NA 151*   < > 156* 157*  BUN 70*   < > 79* 82*  CREATININE 2.20*   < > 2.33* 2.34*   < > = values in this interval not displayed.    Imaging: ECHOCARDIOGRAM COMPLETE    ECHOCARDIOGRAM REPORT       Patient Name:   TARAS RASK Date of Exam: 08/08/2024 Medical Rec #:  [id number]      Height:       72.0 in Accession #:    [id number]     Weight:       164.9 lb Date  of Birth:  1941-03-19       BSA:          1.963 m Patient Age:    83 years       BP:           117/106 mmHg Patient Gender: M              HR:           103 bpm. Exam Location:  [facility]  Procedure: 2D Echo, Cardiac Doppler and Color Doppler (Both Spectral and Color            Flow Doppler were utilized during procedure).  Indications:     Acutre respiratory distress R06.03   History:         Patient has prior history of Echocardiogram examinations, most                  recent 05/01/2021. Acute MI and CAD, Prior CABG,  Arrythmias:Atrial Fibrillation, Signs/Symptoms:Hypotension;                  Risk Factors:Hypertension, Dyslipidemia and Diabetes.   Sonographer:     Thea Norlander RCS Referring Phys:  [id number] INGE JONETTA LECHER Diagnosing Phys: Lonni Hanson MD    Sonographer Comments: Image acquisition challenging due to patient behavioral factors. IMPRESSIONS   1. Left ventricular systolic function is probably normal but is difficult to assess due to limited acoustic windows.. There is mild left ventricular hypertrophy. Left ventricular diastolic function could not be evaluated.  2. Right ventricular systolic function was not well visualized. The right ventricular size is mildly enlarged. There is normal pulmonary artery systolic pressure.  3. The mitral valve is degenerative. No evidence of mitral valve regurgitation.  4. The aortic valve was not well visualized.  5. The inferior vena cava is dilated in size with >50% respiratory variability, suggesting right atrial pressure of 8 mmHg.  6. Cannot exclude a small PFO.  FINDINGS  Left Ventricle: Left ventricular systolic function is probably normal but is difficult to assess due to limited acoustic windows. The left ventricular internal cavity size was normal in size. There is mild left ventricular hypertrophy. Left ventricular  diastolic function could not be evaluated.  Right Ventricle: The right ventricular size is  mildly enlarged. No increase in right ventricular wall thickness. Right ventricular systolic function was not well visualized. There is normal pulmonary artery systolic pressure. The tricuspid regurgitant  velocity is 2.17 m/s, and with an assumed right atrial pressure of 8 mmHg, the estimated right ventricular systolic pressure is 26.8 mmHg.  Left Atrium: Left atrial size was not well visualized.  Right Atrium: Right atrial size was not well visualized.  Pericardium: There is no evidence of pericardial effusion.  Mitral Valve: The mitral valve is degenerative in appearance. There is mild calcification of the mitral valve leaflet(s). No evidence of mitral valve regurgitation.  Tricuspid Valve: The tricuspid valve is not well visualized. Tricuspid valve regurgitation is trivial.  Aortic Valve: The aortic valve was not well visualized.  Pulmonic Valve: The pulmonic valve was not well visualized.  Aorta: The aortic root was not well visualized.  Venous: The inferior vena cava is dilated in size with greater than 50% respiratory variability, suggesting right atrial pressure of 8 mmHg.  IAS/Shunts: Cannot exclude a small PFO.    LEFT VENTRICLE PLAX 2D LVIDd:         4.10 cm LVIDs:         2.90 cm LV PW:         1.00 cm LV IVS:        1.10 cm    IVC IVC diam: 2.50 cm  LEFT ATRIUM         Index LA diam:    4.30 cm 2.19 cm/m  TRICUSPID VALVE TR Peak grad:   18.8 mmHg TR Vmax:        217.00 cm/s  Lonni Hanson MD Electronically signed by Lonni Hanson MD Signature Date/Time: 08/09/2024/7:13:47 AM      Final      I personally reviewed recent imaging.   Palliative Care Assessment and Plan Summary of Established Goals of Care and Medical Treatment Preferences    # Complex medical decision making/goals of care  - Discussion with patient's son this morning followed by second discussion with son and patient's wife this afternoon.  Plan moving forward is transition to full  comfort care.    - Orders placed for  full comfort care this evening.  They understand plan for no more aggressive interventions, lab monitoring, or telemetry.  Only focus moving forward will be symptom management understanding that he is approaching end-of-life.  - TOC order placed for potential discharge to [facility] if he remains stable to do so.  - Discussed with bedside RN as well as Dr. Leesa.  -  Code Status: Do not attempt resuscitation (DNR) - Comfort care  Prognosis: Hours - Days  # Symptom management Patient is receiving these palliative interventions for symptom management with an intent to improve quality of life.   - Pain/dyspnea: Morphine  2 mg scheduled every 6 hours.  Additional morphine  available as needed for breakthrough  - Anxiety: Ativan  1 mg scheduled every 6 hours.  Additional Ativan  available every 3 hours as needed for breakthrough.  - Agitation: Haldol  as needed.  - Excess secretions: Robinul  as needed  # Psycho-social/Spiritual Support:  - Support System: Family including wife and son  # Discharge Planning:  Hospice facility  Thank you for allowing the palliative care team to participate in the care Velma FORBES [name].  Amaryllis Meissner, MD Palliative Care Provider PMT # [phone]  If patient remains symptomatic despite maximum doses, please call PMT at [phone] between 0700 and 1900. Outside of these hours, please call attending, as PMT does not have night coverage.   I personally spent a total of 90 minutes in the care of the patient today including preparing to see the patient, getting/reviewing separately obtained history, performing a medically appropriate exam/evaluation, counseling and educating, placing orders, referring and communicating with other health care professionals, and documenting clinical information in the EHR.     [1]  Allergies Allergen Reactions   Atorvastatin Other (See Comments)    Arthralgia, myalgia   Fluvastatin Other (See  Comments)    Arthralgia, myalgias   Hymenoptera Venom Preparations Other (See Comments)    Unknown    Lisinopril Cough   Metformin And Related Other (See Comments)    Anorexia, diarrhea, nausea, weight loss   Pravastatin Other (See Comments)    Arthralgia, myalgias   Simvastatin Other (See Comments)    myalgias   Spiriva Handihaler [Tiotropium Bromide] Nausea Only   "

## 2024-08-09 NOTE — TOC Initial Note (Signed)
 Transition of Care [facility]) - Initial/Assessment Note    Patient Details  Name: Timothy Mclean MRN: [id number] Date of Birth: 06-Sep-1940  Transition of [facility]) CM/SW Contact:    Corrie JINNY Ruts, LCSW Phone Number: 08/09/2024, 3:50 PM  Clinical Narrative:                 Chart reviewed. Son of the patient called back on behalf of the patient wife. I introduced myself, my role, and reason for consult. The patient has a PCP.   The patient son reports the patient lives in the home with his wife. The patient son reports that the patient needed assistance around the home and he used a walker.   The patient son reports that the patient uses [facility]. The patient son reports that family will assist the patient at D/C. The patient son reports that the patient was in the process of getting HH through PCP. The patient reports that the patient was a resident of [facility] in the past and will not be going back. The patient reports that the son most likely will not need a HH referral because palliative is involved.   The Patient son reports that the patient has a cane, walker, ramps, and raised toilet in the home.         Patient Goals and CMS Choice            Expected Discharge Plan and Services                                              Prior Living Arrangements/Services                       Activities of Daily Living      Permission Sought/Granted                  Emotional Assessment              Admission diagnosis:  AKI (acute kidney injury) [N17.9] Severe sepsis (HCC) [A41.9, R65.20] Altered mental status, unspecified altered mental status type [R41.82] Sepsis with acute hypoxic respiratory failure and septic shock, due to unspecified organism (HCC) [A41.9, R65.21, J96.01] Patient Active Problem List   Diagnosis Date Noted   BPH (benign prostatic hyperplasia) 08/07/2024   Hypothyroidism 08/07/2024   Severe sepsis (HCC)  08/07/2024   Protein-calorie malnutrition, severe 08/01/2024   Closed right hip fracture, initial encounter (HCC) 07/26/2024   Malnutrition of moderate degree 04/29/2021   Community acquired pneumonia 04/26/2021   PNA (pneumonia) 04/26/2021   COVID-19 virus infection    Leg swelling 11/14/2018   Systolic ejection murmur 11/01/2017   Weakness of lower extremity 09/07/2016   Barrett's esophagus 03/09/2014   Hypotension due to drugs 03/06/2014   Emphysema of lung (HCC) 02/19/2014   Cough 02/19/2014   S/P CABG x 4    Permanent atrial fibrillation (HCC): CHA2DS2-VASc Score =4, On Eliquis . Diltiazem  for rate control.    Hyperlipidemia associated with type 2 diabetes mellitus (HCC)    History of colonic polyps 09/10/2011   UC (ulcerative colitis) (HCC) 09/10/2011   Anticoagulant long-term use 09/10/2011   VITAMIN B12 DEFICIENCY 01/30/2009   Type II diabetes mellitus (HCC) 10/31/2007   GOUT 10/31/2007   Essential hypertension 10/31/2007   ACID REFLUX DISEASE 10/31/2007   CAD S/P PTCA-PCI then CABG x4  12/01/1995   ST elevation myocardial infarction (STEMI) of inferior wall, subsequent episode of care [facility]) 10/30/1985   PCP:  Windy Coy, MD Pharmacy:   [facility] - [place], [place] - [address] [place] [address] Phone: [phone] Fax: [phone]  [facility] [place], [place] - [address] [place] [address] [place] [address] Phone: [phone] Fax: [phone]  [facility] - [facility] [address] [place] [address] Phone: [phone] Fax: [phone]     Social Drivers of Health (SDOH) Social History: SDOH Screenings   Food Insecurity: Patient Unable To Answer (07/28/2024)  Housing: Patient Unable To Answer (07/28/2024)  Transportation Needs: Patient Unable To Answer (07/28/2024)  Utilities: Patient Unable To Answer (07/28/2024)  Social Connections: Patient  Unable To Answer (07/28/2024)  Tobacco Use: Medium Risk (08/07/2024)   SDOH Interventions:     Readmission Risk Interventions    08/09/2024    3:50 PM  Readmission Risk Prevention Plan  Transportation Screening Complete  PCP or Specialist Appt within 5-7 Days Complete  Home Care Screening Complete  Medication Review (RN CM) Complete

## 2024-08-09 NOTE — Progress Notes (Addendum)
 " PROGRESS NOTE    LASTER APPLING  FMW:[id number] DOB: March 13, 1941 DOA: 08/07/2024 PCP: Windy Coy, MD  Chief Complaint  Patient presents with   Altered Mental Status   Shortness of Breath    Hospital Course:  Timothy Mclean is an 84 year old male with hypertension, hyperlipidemia, type 2 diabetes, A-fib, CHF, CAD status post CABG, COPD, dementia with gradual weight loss, who presented to the ED with altered mentation.  Patient resides at [facility].  He was found to be hypoxic to 62% on room air, he was placed on nonrebreather and transported to ED. Patient was recently admitted to [facility] 1/7 - 1/14 with right hip fracture for which he underwent intramedullary nail on 1/8.  Hospital stay was complicated by aspiration pneumonia due to dysphagia and AKI requiring Foley catheter placement.  He was discharged to [facility] on a dysphagia diet and was recently taken from a thick liquids to nectar thick liquids. On arrival to the ED patient was tachycardic with pulse of 110, tachypneic, SpO2 95% on nonrebreather.  Labs consistent with hypokalemia, hypernatremia, mildly elevated LFTs, lactic acid 6.0, troponin 188, WBC 37.1.  RVP negative.  UA consistent with UTI. CXR: Small bilateral pleural effusions left lower lobe pneumonia Head CT: No acute intracranial abnormality.  Chronic microvascular ischemic changes CT chest abdomen pelvis: Consolidation in lower lobes bilaterally concerning for pneumonia, possibly from aspiration.  Moderate centrilobular emphysema predominantly in upper lobes bilaterally Met sepsis criteria and was started on antibiotic therapy.  He was placed in the stepdown unit for management.   Subjective: Son is at bedside.  We had extensive discussion regarding the patient's current clinical status and expected prognosis Patient was somnolent for the entirety of exam, does not wake up or participate   Objective: Vitals:   08/09/24 0900 08/09/24 1000 08/09/24 1100  08/09/24 1200  BP: (!) 134/95 105/83 114/70 (!) 111/95  Pulse:  (!) 46 (!) 44 67  Resp: (!) 27 14 (!) 28 (!) 26  Temp:      TempSrc:      SpO2: 94% 94% 90% 97%    Intake/Output Summary (Last 24 hours) at 08/09/2024 1320 Last data filed at 08/09/2024 1200 Gross per 24 hour  Intake 1390.12 ml  Output 1125 ml  Net 265.12 ml   There were no vitals filed for this visit.  Examination: General exam: Appears frail and chronically ill Respiratory system: 4 L Cl[place] in place, diffuse rhonchi bilaterally Cardiovascular system: Rapid irregular rate Neuro: Sleeping, does not awake or participate in exam Extremities: Thin  Assessment & Plan:  Principal Problem:   Severe sepsis (HCC) Active Problems:   Type II diabetes mellitus (HCC)   Essential hypertension   UC (ulcerative colitis) (HCC)   S/P CABG x 4   Permanent atrial fibrillation [employer]): CHA2DS2-VASc Score =4, On Eliquis . Diltiazem  for rate control.   Hyperlipidemia associated with type 2 diabetes mellitus (HCC)   Emphysema of lung (HCC)   BPH (benign prostatic hyperplasia)   Hypothyroidism    Sepsis secondary to pneumonia - On arrival tachypneic, tachycardic, WBC 37.1 - Treatment as below - Urine culture with multiple species - Blood cultures remain negative - Continue monitor WBCs, did receive high-dose steroids  Pneumonia - Aspiration versus HCAP - Continue with unasyn  - WBC is gradually improving - Monitor fever curve  Dysphagia Recurrent aspiration - Meant to be on dysphagia 2 with honey thick liquids - Will keep n.p.o. for now given somnolence - SLP consult when patient is  more alert  Acute hypoxic respiratory failure COPD Asthma Pulmonary fibrosis - Initially required nonrebreather.  Does appear to be struggling with some mucous plugging, continue bronchodilators, pulm toileting - Antibiotics as above - Intermittent CXRs - Pulmonology still following - There was discussion of intubation but upon further  clarification with his son at bedside today the family is clear that they would like to remain DNR/DNI. - Remains very high risk of aspiration - Diuresis as needed  Atrial fibrillation, with RVR - Pulses been variable, up to 140s today.  At home takes midodrine  and digoxin.  Currently patient has been unable to tolerate anything p.o. - Will start on Dilt drip for now.  Monitor blood pressure very closely  Chronic hypotension - Blood pressure requires midodrine  at home.  Currently does not have p.o. capabilities so holding midodrine  for now - If he requires pressor support ICU will be notified and take over - Maintain a MAP goal of over 65  Elevated troponin - Likely secondary to demand ischemia given hypoxic respiratory failure and sepsis - Echocardiogram: Difficult to assess due to limited acoustic windows, mild LVH, diastolic function cannot be evaluated.  Right ventricular systolic function not well-visualized.  Mitral valve degenerative.  Cannot exclude small PFO  AKI on CKD stage IIIa Anion gap metabolic acidosis - Baseline creatinine 1.4, creatinine 2.34 today.-Continue with D5W - Follow strict I's and O's, has foley. - Avoid nephrotoxic agents - Trend CMP  Type 2 diabetes DKA Hypothyroidism - Blood glucose on arrival 498, beta hydroxybutyric acid 2.02. - Status post insulin  drip - Blood glucose resolved now.  Currently on D5, will continue this given hypernatremia and NPO. - CBGs consistently under 150 - Hemoglobin A1c 8.5 - Continue with basal/bolus and sliding scale.  Titrate as needed  Hypernatremia - Continue with D5W - Trend CMP  Hypothyroidism - Currently NPO.  If persistently n.p.o. we will need to Synthroid  to IV  Elevated LFTs - CT abdomen pelvis without acute process - Elevation likely secondary to sepsis - Trend CMP  Acute metabolic encephalopathy Dementia - Head CT negative for acute abnormalities - On patient's best days at home he is alert and  oriented to self and family members.  Does struggle with short-term memory - Continue frequent reorientation - Delirium precautions - Avoid sedating meds is much as able  Ulcerative colitis - Resume home mesalamine  once he is tolerating p.o.  Recent right hip fracture - 1/8: Intramedullary nailing - PT/OT patient is alert enough to be able to participate  Poor prognosis - Has had worsening aspiration over the last 2 weeks as well as declining appetite and weight loss outpatient secondary to his chronic dementia.  Given recent hip fracture he is acutely deconditioned.  I have had extensive discussion with his son at bedside today discussing that there is no cure for aspiration and given this is his second pneumonia within 2 weeks secondary to aspiration, this is likely to recur indefinitely.  He endorses understanding.  The patient son reports he is continuing to discuss goals of care with his mother, they will likely choose hospice but would like to give the patient some more time with antibiotics to see if he returns to his baseline.  Palliative care has been consulted, appreciate further discussions   DVT prophylaxis: Heparin    Code Status: Limited: Do not attempt resuscitation (DNR) -DNR-LIMITED -Do Not Intubate/DNI  Disposition:  inpt pending significant clinical resolution   Consultants:  Treatment Team:  Consulting Physician: Oneita Rakers, MD  Procedures:    Antimicrobials:  Anti-infectives (From admission, onward)    Start     Dose/Rate Route Frequency Ordered Stop   08/08/24 1200  Ampicillin -Sulbactam (UNASYN ) 3 g in sodium chloride  0.9 % 100 mL IVPB        3 g 200 mL/hr over 30 Minutes Intravenous Every 12 hours 08/08/24 0929     08/07/24 1900  piperacillin -tazobactam (ZOSYN ) IVPB 3.375 g  Status:  Discontinued        3.375 g 12.5 mL/hr over 240 Minutes Intravenous Every 8 hours 08/07/24 1302 08/08/24 0927   08/07/24 1312  vancomycin  variable dose per unstable renal  function (pharmacist dosing)  Status:  Discontinued         Does not apply See admin instructions 08/07/24 1312 08/08/24 0927   08/07/24 0945  vancomycin  (VANCOREADY) IVPB 1750 mg/350 mL        1,750 mg 175 mL/hr over 120 Minutes Intravenous  Once 08/07/24 0937 08/07/24 1243   08/07/24 0930  piperacillin -tazobactam (ZOSYN ) IVPB 3.375 g        3.375 g 100 mL/hr over 30 Minutes Intravenous  Once 08/07/24 0923 08/07/24 1041       Data Reviewed: I have personally reviewed following labs and imaging studies CBC: Recent Labs  Lab 08/07/24 0949 08/08/24 0414 08/09/24 0342  WBC 37.1* 40.1* 37.1*  NEUTROABS 34.9*  --   --   HGB 10.6* 9.5* 10.2*  HCT 33.6* 30.3* 33.5*  MCV 94.1 94.1 97.4  PLT 399 320 283   Basic Metabolic Panel: Recent Labs  Lab 08/07/24 1656 08/07/24 2056 08/08/24 0414 08/08/24 1050 08/08/24 1508 08/09/24 0342  NA  --  152* 151* 155* 156* 157*  K  --  3.4* 4.0 3.9 4.0 3.7  CL  --  116* 117* 118* 118* 120*  CO2  --  22 22 22 22 23   GLUCOSE  --  179* 152* 170* 198* 152*  BUN  --  68* 70* 74* 79* 82*  CREATININE  --  2.11* 2.20* 2.23* 2.33* 2.34*  CALCIUM   --  8.5* 8.5* 8.9 8.6* 8.9  MG 2.7*  --   --   --   --  3.0*   GFR: Estimated Creatinine Clearance: 24.4 mL/min (A) (by C-G formula based on SCr of 2.34 mg/dL (H)). Liver Function Tests: Recent Labs  Lab 08/07/24 1005 08/08/24 0414  AST 68* 95*  ALT 40 44  ALKPHOS 170* 161*  BILITOT 1.6* 1.2  PROT 5.1* 4.9*  ALBUMIN  2.7* 2.5*   CBG: Recent Labs  Lab 08/08/24 1700 08/08/24 2351 08/09/24 0403 08/09/24 0748 08/09/24 1137  GLUCAP 161* 174* 144* 130* 100*    Recent Results (from the past 240 hours)  Blood Culture (routine x 2)     Status: None (Preliminary result)   Collection Time: 08/07/24  9:13 AM   Specimen: BLOOD LEFT HAND  Result Value Ref Range Status   Specimen Description BLOOD LEFT HAND  Final   Special Requests   Final    Blood Culture results may not be optimal due to an  inadequate volume of blood received in culture bottles   Culture   Final    NO GROWTH 2 DAYS Performed at [facility], [address] [place], [place] [address]    Report Status PENDING  Incomplete  Blood Culture (routine x 2)     Status: None (Preliminary result)   Collection Time: 08/07/24  9:49 AM   Specimen: BLOOD RIGHT FOREARM  Result  Value Ref Range Status   Specimen Description BLOOD RIGHT FOREARM  Final   Special Requests Blood Culture adequate volume  Final   Culture   Final    NO GROWTH 2 DAYS Performed at [facility], [address] [place], [place] [address]    Report Status PENDING  Incomplete  Resp panel by RT-PCR (RSV, Flu A&B, Covid) Anterior Nasal Swab     Status: None   Collection Time: 08/07/24  9:49 AM   Specimen: Anterior Nasal Swab  Result Value Ref Range Status   SARS Coronavirus 2 by RT PCR NEGATIVE NEGATIVE Final    Comment: (NOTE) SARS-CoV-2 target nucleic acids are NOT DETECTED.  The SARS-CoV-2 RNA is generally detectable in upper respiratory specimens during the acute phase of infection. The lowest concentration of SARS-CoV-2 viral copies this assay can detect is 138 copies/mL. A negative result does not preclude SARS-Cov-2 infection and should not be used as the sole basis for treatment or other patient management decisions. A negative result may occur with  improper specimen collection/handling, submission of specimen other than nasopharyngeal swab, presence of viral mutation(s) within the areas targeted by this assay, and inadequate number of viral copies(<138 copies/mL). A negative result must be combined with clinical observations, patient history, and epidemiological information. The expected result is Negative.  Fact Sheet for Patients:  bloggercourse.com  Fact Sheet for Healthcare Providers:  seriousbroker.it  This test is no t yet approved or cleared by the [place]  [place] FDA and  has been authorized for detection and/or diagnosis of SARS-CoV-2 by FDA under an Emergency Use Authorization (EUA). This EUA will remain  in effect (meaning this test can be used) for the duration of the COVID-19 declaration under Section 564(b)(1) of the Act, 21 U.S.C.section 360bbb-3(b)(1), unless the authorization is terminated  or revoked sooner.       Influenza A by PCR NEGATIVE NEGATIVE Final   Influenza B by PCR NEGATIVE NEGATIVE Final    Comment: (NOTE) The Xpert Xpress SARS-CoV-2/FLU/RSV plus assay is intended as an aid in the diagnosis of influenza from Nasopharyngeal swab specimens and should not be used as a sole basis for treatment. Nasal washings and aspirates are unacceptable for Xpert Xpress SARS-CoV-2/FLU/RSV testing.  Fact Sheet for Patients: bloggercourse.com  Fact Sheet for Healthcare Providers: seriousbroker.it  This test is not yet approved or cleared by the [place]  FDA and has been authorized for detection and/or diagnosis of SARS-CoV-2 by FDA under an Emergency Use Authorization (EUA). This EUA will remain in effect (meaning this test can be used) for the duration of the COVID-19 declaration under Section 564(b)(1) of the Act, 21 U.S.C. section 360bbb-3(b)(1), unless the authorization is terminated or revoked.     Resp Syncytial Virus by PCR NEGATIVE NEGATIVE Final    Comment: (NOTE) Fact Sheet for Patients: bloggercourse.com  Fact Sheet for Healthcare Providers: seriousbroker.it  This test is not yet approved or cleared by the [place]  FDA and has been authorized for detection and/or diagnosis of SARS-CoV-2 by FDA under an Emergency Use Authorization (EUA). This EUA will remain in effect (meaning this test can be used) for the duration of the COVID-19 declaration under Section 564(b)(1) of the Act, 21 U.S.C. section  360bbb-3(b)(1), unless the authorization is terminated or revoked.  Performed at [facility], [address] [place], [place] [address]   Urine Culture     Status: Abnormal   Collection Time: 08/07/24 11:50 AM   Specimen: Urine, Random  Result Value  Ref Range Status   Specimen Description   Final    URINE, RANDOM Performed at [facility], [address] [place], [place] [address]    Special Requests   Final    NONE Reflexed from [id number] Performed at [facility], [address] [place], [place] [address]    Culture MULTIPLE SPECIES PRESENT, SUGGEST RECOLLECTION (A)  Final   Report Status 08/08/2024 FINAL  Final  MRSA Next Gen by PCR, Nasal     Status: None   Collection Time: 08/08/24  7:38 AM   Specimen: Nasal Mucosa; Nasal Swab  Result Value Ref Range Status   MRSA by PCR Next Gen NOT DETECTED NOT DETECTED Final    Comment: (NOTE) The GeneXpert MRSA Assay (FDA approved for NASAL specimens only), is one component of a comprehensive MRSA colonization surveillance program. It is not intended to diagnose MRSA infection nor to guide or monitor treatment for MRSA infections. Test performance is not FDA approved in patients less than 2 years old. Performed at [facility], [address], [place], [place] [address]      Radiology Studies: ECHOCARDIOGRAM COMPLETE Result Date: 08/09/2024    ECHOCARDIOGRAM REPORT   Patient Name:   JAHMAL DUNAVANT Date of Exam: 08/08/2024 Medical Rec #:  [id number]      Height:       72.0 in Accession #:    [id number]     Weight:       164.9 lb Date of Birth:  1941-05-18       BSA:          1.963 m Patient Age:    83 years       BP:           117/106 mmHg Patient Gender: M              HR:           103 bpm. Exam Location:  [facility] Procedure: 2D Echo, Cardiac Doppler and Color Doppler (Both Spectral and Color            Flow Doppler were utilized during procedure). Indications:     Acutre respiratory distress R06.03   History:         Patient has prior history of Echocardiogram examinations, most                  recent 05/01/2021. Acute MI and CAD, Prior CABG,                  Arrythmias:Atrial Fibrillation, Signs/Symptoms:Hypotension;                  Risk Factors:Hypertension, Dyslipidemia and Diabetes.  Sonographer:     Thea Norlander RCS Referring Phys:  [id number] INGE JONETTA LECHER Diagnosing Phys: Lonni Hanson MD  Sonographer Comments: Image acquisition challenging due to patient behavioral factors. IMPRESSIONS  1. Left ventricular systolic function is probably normal but is difficult to assess due to limited acoustic windows.. There is mild left ventricular hypertrophy. Left ventricular diastolic function could not be evaluated.  2. Right ventricular systolic function was not well visualized. The right ventricular size is mildly enlarged. There is normal pulmonary artery systolic pressure.  3. The mitral valve is degenerative. No evidence of mitral valve regurgitation.  4. The aortic valve was not well visualized.  5. The inferior vena cava is dilated in size with >50% respiratory variability, suggesting right atrial pressure of 8 mmHg.  6. Cannot exclude a small  PFO. FINDINGS  Left Ventricle: Left ventricular systolic function is probably normal but is difficult to assess due to limited acoustic windows. The left ventricular internal cavity size was normal in size. There is mild left ventricular hypertrophy. Left ventricular diastolic function could not be evaluated. Right Ventricle: The right ventricular size is mildly enlarged. No increase in right ventricular wall thickness. Right ventricular systolic function was not well visualized. There is normal pulmonary artery systolic pressure. The tricuspid regurgitant velocity is 2.17 m/s, and with an assumed right atrial pressure of 8 mmHg, the estimated right ventricular systolic pressure is 26.8 mmHg. Left Atrium: Left atrial size was not well visualized. Right Atrium:  Right atrial size was not well visualized. Pericardium: There is no evidence of pericardial effusion. Mitral Valve: The mitral valve is degenerative in appearance. There is mild calcification of the mitral valve leaflet(s). No evidence of mitral valve regurgitation. Tricuspid Valve: The tricuspid valve is not well visualized. Tricuspid valve regurgitation is trivial. Aortic Valve: The aortic valve was not well visualized. Pulmonic Valve: The pulmonic valve was not well visualized. Aorta: The aortic root was not well visualized. Venous: The inferior vena cava is dilated in size with greater than 50% respiratory variability, suggesting right atrial pressure of 8 mmHg. IAS/Shunts: Cannot exclude a small PFO.  LEFT VENTRICLE PLAX 2D LVIDd:         4.10 cm LVIDs:         2.90 cm LV PW:         1.00 cm LV IVS:        1.10 cm  IVC IVC diam: 2.50 cm LEFT ATRIUM         Index LA diam:    4.30 cm 2.19 cm/m  TRICUSPID VALVE TR Peak grad:   18.8 mmHg TR Vmax:        217.00 cm/s Lonni Hanson MD Electronically signed by Lonni Hanson MD Signature Date/Time: 08/09/2024/7:13:47 AM    Final    US  Venous Img Lower Bilateral (DVT) Result Date: 08/07/2024 EXAM: ULTRASOUND DUPLEX OF THE BILATERAL LOWER EXTREMITY VEINS TECHNIQUE: Duplex ultrasound using B-mode/gray scaled imaging and Doppler spectral analysis and color flow was obtained of the deep venous structures of the bilateral lower extremity. COMPARISON: None available. CLINICAL HISTORY: Swelling. FINDINGS: RIGHT: The common femoral vein, femoral vein, popliteal vein, and posterior tibial vein demonstrate normal compressibility with normal color flow and spectral analysis. Calcified plaque in the right common femoral artery and superficial femoral artery (SFA) incidentally noted. Subcutaneous edema in the right calf. LEFT: The common femoral vein, femoral vein, popliteal vein, and posterior tibial vein demonstrate normal compressibility with normal color flow and spectral  analysis. Calcified plaque in the mid left superficial femoral artery (SFA) and popliteal artery incidentally noted. IMPRESSION: 1. No evidence of DVT. 2. Subcutaneous edema in the right calf. Electronically signed by: Dayne Hassell MD 08/07/2024 02:56 PM EST RP Workstation: [id number]    Scheduled Meds:  Chlorhexidine  Gluconate Cloth  6 each Topical Daily   heparin   5,000 Units Subcutaneous Q8H   insulin  aspart  0-15 Units Subcutaneous Q4H   insulin  glargine-yfgn  7 Units Subcutaneous Daily   mouth rinse  15 mL Mouth Rinse 4 times per day   sodium chloride  flush  3 mL Intravenous Q12H   Continuous Infusions:  ampicillin -sulbactam (UNASYN ) IV 3 g (08/09/24 1230)   dextrose  50 mL/hr at 08/09/24 1200   diltiazem  (CARDIZEM ) infusion 5 mg/hr (08/09/24 1228)     LOS: 2 days  MDM:  Patient is high risk for one or more organ failure.  They necessitate ongoing hospitalization for continued IV therapies and subsequent lab monitoring. Total time spent interpreting labs and vitals, reviewing the medical record, coordinating care amongst consultants and care team members, directly assessing and discussing care with the patient and/or family: 55 min  Evaan Tidwell, DO [facility]  To contact the attending physician between 7A-7P please use Epic Chat. To contact the covering physician during after hours 7P-7A, please review Amion.  08/09/2024, 1:20 PM   *This document has been created with the assistance of dictation software. Please excuse typographical errors. *   "

## 2024-08-09 NOTE — TOC Progression Note (Signed)
 Transition of Care [facility]) - Progression Note    Patient Details  Name: Timothy Mclean MRN: [id number] Date of Birth: 01-Oct-1940  [facility] of [facility]) CM/SW Contact  K'La JINNY Ruts, LCSW Phone Number: 08/09/2024, 2:59 PM  Clinical Narrative:    Chart reviewed. Left a VM for the patient wife to complete the readmission preventative screen. Waiting for a call back.                      Expected Discharge Plan and Services                                               Social Drivers of Health (SDOH) Interventions SDOH Screenings   Food Insecurity: Patient Unable To Answer (07/28/2024)  Housing: Patient Unable To Answer (07/28/2024)  Transportation Needs: Patient Unable To Answer (07/28/2024)  Utilities: Patient Unable To Answer (07/28/2024)  Social Connections: Patient Unable To Answer (07/28/2024)  Tobacco Use: Medium Risk (08/07/2024)    Readmission Risk Interventions     No data to display

## 2024-08-09 NOTE — Plan of Care (Signed)
  Problem: Education: Goal: Knowledge of General Education information will improve Description: Including pain rating scale, medication(s)/side effects and non-pharmacologic comfort measures Outcome: Not Progressing   Problem: Health Behavior/Discharge Planning: Goal: Ability to manage health-related needs will improve Outcome: Not Progressing   Problem: Clinical Measurements: Goal: Diagnostic test results will improve Outcome: Not Progressing   Problem: Activity: Goal: Risk for activity intolerance will decrease Outcome: Not Progressing

## 2024-08-09 NOTE — Progress Notes (Signed)
"  ° °      CROSS COVER NOTE  NAME: ELPIDIO THIELEN MRN: [id number] DOB : Apr 26, 1941 ATTENDING PHYSICIAN: Awanda City, MD  Message received from RN reporting that Mr. Lober is swatting in the air, moaning groaning, yelling out at times, trying to remove his mitts, leads and overall restless.  RN requesting patient be placed on Precedex precedex.  RN reports that previous shifts had given as needed Haldol  and morphine  with no improvement.  Last Haldol  administration was around 8 AM this morning.  Increased dose IV Haldol  trialed with no improvement.  0.5 mg IV Ativan  x 1 subsequently ordered.  This document was prepared using Dragon voice recognition software and may include unintentional dictation errors.  Rockie Rams DNP, MBA, FNP-BC Nurse Practitioner [facility] Pager [phone]  "

## 2024-08-10 DIAGNOSIS — A419 Sepsis, unspecified organism: Secondary | ICD-10-CM | POA: Diagnosis not present

## 2024-08-10 DIAGNOSIS — Z515 Encounter for palliative care: Secondary | ICD-10-CM | POA: Diagnosis not present

## 2024-08-10 DIAGNOSIS — N179 Acute kidney failure, unspecified: Secondary | ICD-10-CM | POA: Diagnosis not present

## 2024-08-10 DIAGNOSIS — R6520 Severe sepsis without septic shock: Secondary | ICD-10-CM | POA: Diagnosis not present

## 2024-08-12 LAB — CULTURE, BLOOD (ROUTINE X 2)
Culture: NO GROWTH
Culture: NO GROWTH
Special Requests: ADEQUATE

## 2024-08-14 ENCOUNTER — Other Ambulatory Visit (HOSPITAL_COMMUNITY): Payer: Self-pay

## 2024-08-14 ENCOUNTER — Ambulatory Visit: Admitting: Urology

## 2024-08-20 NOTE — Progress Notes (Signed)
 " Daily Progress Note   Patient Name: Timothy Mclean       Date: 08/11/2024 DOB: [birth year]  Age: 84 y.o. MRN#: [id number] Attending Physician: No att. providers found Primary Care Physician: Windy Coy, MD Admit Date: 08/07/2024 Length of Stay: 3 days  Reason for Consultation/Follow-up: Non pain symptom management and Pain control  Subjective:   CC: Terminal agitation, end of life symptom management  Subjective:  Chart reviewed including personal review of most recent notes from Hospitalist.    Met today with son at bedside and saw and examined patient today.  MAR reviewed and he has been receiving scheduled morphine  and ativan .   Son reports that he has appeared very comfortable this morning and symptoms reported to be well managed since transition to comfort and on scheduled meds.  On exam, he is resting comfortably in no distress.    Discussed plan for comfort, signs of discomfort, and family hope for transition to [facility] as this would be closer to their home and easier for his wife to visit.  Son hopeful for transfer prior to weather event this weekend.  Review of Systems Unable to obtain Objective:   Vital Signs:  BP (!) 57/45 (BP Location: Right Arm)   Pulse (!) 115   Temp 97.8 F (36.6 C) (Oral)   Resp 15   SpO2 100%   Physical Exam: General: somnolet, resting comfortably HENT: normocephalic, atraumatic, dry mucous membranes Cardiovascular: tachycardic Respiratory: no increased work of breathing noted, no significant apenic pauses Abdomen: not distended Skin: no rashes or lesions on visible skin  Imaging: @IMAGES @  I personally reviewed recent imaging.   Assessment & Plan:   Assessment: 84 year old male with hypertension, hyperlipidemia, type 2 diabetes, A-fib, CHF, CAD status post CABG, COPD, dementia with recent hospitalization for hip fracture status post repair who presented from [facility] with hypoxia. He was subsequently diagnosed with  sepsis secondary to pneumonia thought to be from aspiration. He also has A-fib with RVR, AKI on CKD, and elevated troponin. He was in DKA on arrival.  Following meeting with family, he was transitioned to full comfort care yesterday. Recommendations/Plan: # Complex medical decision making/goals of care:  - Continue plan for full comfort care moving forward.  - Symptomatically well controlled.  Continue same  - Family hopeful for transition to [facility].  Spoke with liaison and TOC to coordinate care.    - Anticipatory guidance and education provided.  - Discussed with Dr. Leesa.  -  Code Status: Prior  Prognosis: < 2 weeks  # Symptom management: Patient is receiving these palliative interventions for symptom management with an intent to improve quality of life.   - Pain/dyspnea: Continue scheduled morphine  Q6 hours with additional breakthrough PRN.  - Agitation: Improved on scheduled ativan .  Continue same with additional haldol  or ativan  prn  - Excess secretions: Robinul  as needed  # Discharge Planning: Hospice facility  -  Discussed with: Dr. Leesa, son at bedside  Thank you for allowing the palliative care team to participate in the care Timothy Mclean.  Timothy Meissner, MD Palliative Care Provider PMT # [phone]  If patient remains symptomatic despite maximum doses, please call PMT at [phone] between 0700 and 1900. Outside of these hours, please call attending, as PMT does not have night coverage.   I personally spent a total of 35 minutes in the care of the patient today including preparing to see the patient, getting/reviewing separately obtained history, performing a medically appropriate exam/evaluation, counseling  and educating, placing orders, referring and communicating with other health care professionals, and documenting clinical information in the EHR.  "

## 2024-08-20 NOTE — Plan of Care (Signed)
  Problem: Education: Goal: Knowledge of General Education information will improve Description: Including pain rating scale, medication(s)/side effects and non-pharmacologic comfort measures Outcome: Progressing   Problem: Health Behavior/Discharge Planning: Goal: Ability to manage health-related needs will improve Outcome: Progressing   Problem: Clinical Measurements: Goal: Ability to maintain clinical measurements within normal limits will improve Outcome: Progressing Goal: Will remain free from infection Outcome: Progressing Goal: Diagnostic test results will improve Outcome: Progressing Goal: Respiratory complications will improve Outcome: Progressing Goal: Cardiovascular complication will be avoided Outcome: Progressing   Problem: Activity: Goal: Risk for activity intolerance will decrease Outcome: Progressing   Problem: Nutrition: Goal: Adequate nutrition will be maintained Outcome: Progressing   Problem: Coping: Goal: Level of anxiety will decrease Outcome: Progressing   Problem: Elimination: Goal: Will not experience complications related to bowel motility Outcome: Progressing Goal: Will not experience complications related to urinary retention Outcome: Progressing   Problem: Pain Managment: Goal: General experience of comfort will improve and/or be controlled Outcome: Progressing   Problem: Safety: Goal: Ability to remain free from injury will improve Outcome: Progressing   Problem: Skin Integrity: Goal: Risk for impaired skin integrity will decrease Outcome: Progressing   Problem: Education: Goal: Knowledge of the prescribed therapeutic regimen will improve Outcome: Progressing   Problem: Clinical Measurements: Goal: Quality of life will improve Outcome: Progressing   Problem: Respiratory: Goal: Verbalizations of increased ease of respirations will increase Outcome: Progressing   Problem: Role Relationship: Goal: Family's ability to cope with  current situation will improve Outcome: Progressing Goal: Ability to verbalize concerns, feelings, and thoughts to partner or family member will improve Outcome: Progressing   Problem: Pain Management: Goal: Satisfaction with pain management regimen will improve Outcome: Progressing

## 2024-08-20 NOTE — Discharge Summary (Signed)
 "  DEATH SUMMARY   Patient Details  Name: Timothy Mclean MRN: [id number] DOB: 1940-11-03 ERE:Aonfhmzw, Maude, MD  Admission/Discharge Information   Admit Date:  2024-08-19  Date of Death: Date of Death: 08/22/2024  Time of Death: Time of Death: 08-25-1200  Length of Stay: 3   Principle Cause of death: Sepsis 2/2 to Pneumonia   Hospital Diagnoses: Principal Problem:   Severe sepsis (HCC) Active Problems:   Type II diabetes mellitus (HCC)   Essential hypertension   UC (ulcerative colitis) (HCC)   S/P CABG x 4   Permanent atrial fibrillation (HCC): CHA2DS2-VASc Score =4, On Eliquis . Diltiazem  for rate control.   Hyperlipidemia associated with type 2 diabetes mellitus (HCC)   Emphysema of lung (HCC)   BPH (benign prostatic hyperplasia)   Hypothyroidism   Hospital Course: Timothy Mclean is an 84 year old male with hypertension, hyperlipidemia, type 2 diabetes, A-fib, CHF, CAD status post CABG, COPD, dementia with gradual weight loss, who presented to the ED with altered mentation.  Patient resides at [facility].  He was found to be hypoxic to 62% on room air, he was placed on nonrebreather and transported to ED. Patient was recently admitted to [facility] 1/7 - 1/14 with right hip fracture for which he underwent intramedullary nail on 1/8.  Hospital stay was complicated by aspiration pneumonia due to dysphagia and AKI requiring Foley catheter placement.  He was discharged to [facility] on a dysphagia diet and was recently taken from a thick liquids to nectar thick liquids. On arrival to the ED patient was tachycardic with pulse of 110, tachypneic, SpO2 95% on nonrebreather.  Labs consistent with hypokalemia, hypernatremia, mildly elevated LFTs, lactic acid 6.0, troponin 188, WBC 37.1.  RVP negative.  UA consistent with UTI. CXR: Small bilateral pleural effusions left lower lobe pneumonia Head CT: No acute intracranial abnormality.  Chronic microvascular ischemic changes CT chest abdomen  pelvis: Consolidation in lower lobes bilaterally concerning for pneumonia, possibly from aspiration.  Moderate centrilobular emphysema predominantly in upper lobes bilaterally Met sepsis criteria and was started on antibiotic therapy.  He was initially placed in the stepdown unit for management. Ultimately on 1/21 family opted to make the patient comfort measures only.  He was transitioned to comfort measures. On 08/22/24 patient expired with presumed cause of death: respiratory arrest, sepsis secondary to pneumonia.    Sepsis secondary to pneumonia Pneumonia Dysphagia Recurrent aspiration Acute hypoxic respiratory failure COPD Asthma Pulmonary fibrosis  Atrial fibrillation, with RVR  Chronic hypotension Elevated troponin AKI on CKD stage IIIa Anion gap metabolic acidosis Type 2 diabetes DKA Hypothyroidism Hypernatremia  Hypothyroidism Elevated LFTs Acute metabolic encephalopathy Dementia Ulcerative colitis Recent right hip fracture - 1/8: Intramedullary nailing     The results of significant diagnostics from this hospitalization (including imaging, microbiology, ancillary and laboratory) are listed below for reference.   Significant Diagnostic Studies: ECHOCARDIOGRAM COMPLETE Result Date: 08/09/2024    ECHOCARDIOGRAM REPORT   Patient Name:   Timothy Mclean Date of Exam: 08/08/2024 Medical Rec #:  [id number]      Height:       72.0 in Accession #:    [id number]     Weight:       164.9 lb Date of Birth:  [birth year]       BSA:          1.963 m Patient Age:    83 years       BP:           117/106  mmHg Patient Gender: M              HR:           103 bpm. Exam Location:  [facility] Procedure: 2D Echo, Cardiac Doppler and Color Doppler (Both Spectral and Color            Flow Doppler were utilized during procedure). Indications:     Acutre respiratory distress R06.03  History:         Patient has prior history of Echocardiogram examinations, most                  recent 05/01/2021. Acute MI and  CAD, Prior CABG,                  Arrythmias:Atrial Fibrillation, Signs/Symptoms:Hypotension;                  Risk Factors:Hypertension, Dyslipidemia and Diabetes.  Sonographer:     Thea Norlander RCS Referring Phys:  [id number] INGE JONETTA LECHER Diagnosing Phys: Lonni Hanson MD  Sonographer Comments: Image acquisition challenging due to patient behavioral factors. IMPRESSIONS  1. Left ventricular systolic function is probably normal but is difficult to assess due to limited acoustic windows.. There is mild left ventricular hypertrophy. Left ventricular diastolic function could not be evaluated.  2. Right ventricular systolic function was not well visualized. The right ventricular size is mildly enlarged. There is normal pulmonary artery systolic pressure.  3. The mitral valve is degenerative. No evidence of mitral valve regurgitation.  4. The aortic valve was not well visualized.  5. The inferior vena cava is dilated in size with >50% respiratory variability, suggesting right atrial pressure of 8 mmHg.  6. Cannot exclude a small PFO. FINDINGS  Left Ventricle: Left ventricular systolic function is probably normal but is difficult to assess due to limited acoustic windows. The left ventricular internal cavity size was normal in size. There is mild left ventricular hypertrophy. Left ventricular diastolic function could not be evaluated. Right Ventricle: The right ventricular size is mildly enlarged. No increase in right ventricular wall thickness. Right ventricular systolic function was not well visualized. There is normal pulmonary artery systolic pressure. The tricuspid regurgitant velocity is 2.17 m/s, and with an assumed right atrial pressure of 8 mmHg, the estimated right ventricular systolic pressure is 26.8 mmHg. Left Atrium: Left atrial size was not well visualized. Right Atrium: Right atrial size was not well visualized. Pericardium: There is no evidence of pericardial effusion. Mitral Valve: The mitral  valve is degenerative in appearance. There is mild calcification of the mitral valve leaflet(s). No evidence of mitral valve regurgitation. Tricuspid Valve: The tricuspid valve is not well visualized. Tricuspid valve regurgitation is trivial. Aortic Valve: The aortic valve was not well visualized. Pulmonic Valve: The pulmonic valve was not well visualized. Aorta: The aortic root was not well visualized. Venous: The inferior vena cava is dilated in size with greater than 50% respiratory variability, suggesting right atrial pressure of 8 mmHg. IAS/Shunts: Cannot exclude a small PFO.  LEFT VENTRICLE PLAX 2D LVIDd:         4.10 cm LVIDs:         2.90 cm LV PW:         1.00 cm LV IVS:        1.10 cm  IVC IVC diam: 2.50 cm LEFT ATRIUM         Index LA diam:    4.30 cm 2.19 cm/m  TRICUSPID VALVE TR Peak  grad:   18.8 mmHg TR Vmax:        217.00 cm/s Lonni Hanson MD Electronically signed by Lonni Hanson MD Signature Date/Time: 08/09/2024/7:13:47 AM    Final    US  Venous Img Lower Bilateral (DVT) Result Date: 08/07/2024 EXAM: ULTRASOUND DUPLEX OF THE BILATERAL LOWER EXTREMITY VEINS TECHNIQUE: Duplex ultrasound using B-mode/gray scaled imaging and Doppler spectral analysis and color flow was obtained of the deep venous structures of the bilateral lower extremity. COMPARISON: None available. CLINICAL HISTORY: Swelling. FINDINGS: RIGHT: The common femoral vein, femoral vein, popliteal vein, and posterior tibial vein demonstrate normal compressibility with normal color flow and spectral analysis. Calcified plaque in the right common femoral artery and superficial femoral artery (SFA) incidentally noted. Subcutaneous edema in the right calf. LEFT: The common femoral vein, femoral vein, popliteal vein, and posterior tibial vein demonstrate normal compressibility with normal color flow and spectral analysis. Calcified plaque in the mid left superficial femoral artery (SFA) and popliteal artery incidentally noted.  IMPRESSION: 1. No evidence of DVT. 2. Subcutaneous edema in the right calf. Electronically signed by: Katheleen Faes MD 08/07/2024 02:56 PM EST RP Workstation: [id number]   CT CHEST ABDOMEN PELVIS WO CONTRAST Result Date: 08/07/2024 EXAM: CT CHEST, ABDOMEN AND PELVIS WITHOUT CONTRAST 08/07/2024 12:26:07 PM TECHNIQUE: CT of the chest, abdomen and pelvis was performed without the administration of intravenous contrast. Multiplanar reformatted images are provided for review. Automated exposure control, iterative reconstruction, and/or weight based adjustment of the mA/kV was utilized to reduce the radiation dose to as low as reasonably achievable. COMPARISON: CT of the abdomen and pelvis dated 07/26/2024. CLINICAL HISTORY: Sepsis, respiratory failure, concern for pneumonia. Also AKI on CKD, recent urinary tract obstruction. FINDINGS: CHEST: MEDIASTINUM AND LYMPH NODES: Heart and pericardium are unremarkable. There is extensive calcific coronary artery disease present. The central airways are clear. No mediastinal, hilar or axillary lymphadenopathy. The patient is status post sternotomy. The thoracic aorta demonstrates moderate calcific atheromatous disease. LUNGS AND PLEURA: There is moderate central lobular emphysema, predominantly involving the upper lobes bilaterally. There is consolidation present dependently within the lower lobes bilaterally, significantly worse within the left lower lobe, concerning for pneumonia, possibly from aspiration. The nondependent lungs are clear. No pleural effusion. No pneumothorax. ABDOMEN AND PELVIS: LIVER: Unremarkable. GALLBLADDER AND BILE DUCTS: The patient is status post cholecystectomy. No biliary ductal dilatation. SPLEEN: No acute abnormality. PANCREAS: No acute abnormality. ADRENAL GLANDS: No acute abnormality. KIDNEYS, URETERS AND BLADDER: No stones in the kidneys or ureters. No hydronephrosis. No perinephric or periureteral stranding. A foley catheter is present within  the collapsed urinary bladder. GI AND BOWEL: Stomach demonstrates no acute abnormality. There is no bowel obstruction. There is enteric contrast within the rectum and colon. REPRODUCTIVE ORGANS: No acute abnormality. PERITONEUM AND RETROPERITONEUM: No ascites. No free air. VASCULATURE: Aorta is normal in caliber. ABDOMINAL AND PELVIS LYMPH NODES: No lymphadenopathy. BONES AND SOFT TISSUES: The patient is status post orthopedic fixation of the right femoral head and neck. There are degenerative changes present throughout the thoracolumbar spine. No focal soft tissue abnormality. IMPRESSION: 1. Consolidation in the lower lobes bilaterally, significantly worse within the left lower lobe, concerning for pneumonia, possibly from aspiration. 2. Moderate centrilobular emphysema, predominantly involving the upper lobes bilaterally. Electronically signed by: Evalene Coho MD 08/07/2024 12:58 PM EST RP Workstation: [id number]   CT HEAD WO CONTRAST ( ) Result Date: 08/07/2024 EXAM: CT HEAD WITHOUT CONTRAST 08/07/2024 12:26:07 PM TECHNIQUE: CT of the head was performed without the administration of intravenous contrast. Automated  exposure control, iterative reconstruction, and/or weight based adjustment of the mA/kV was utilized to reduce the radiation dose to as low as reasonably achievable. COMPARISON: CT head 07/26/2024 CLINICAL HISTORY: AMS FINDINGS: BRAIN AND VENTRICLES: No acute hemorrhage. No evidence of acute infarct. No hydrocephalus. No extra-axial collection. No mass effect or midline shift. Atrophy and chronic microvascular ischemic change of the white matter are similar. Small left chronic parietooccipital infarct. ORBITS: No acute abnormality. SINUSES: No acute abnormality. SOFT TISSUES AND SKULL: No acute soft tissue abnormality. No skull fracture. IMPRESSION: 1. No acute intracranial abnormality. 2. Similar chronic microvascular ischemic change. Electronically signed by: Glendia Molt MD 08/07/2024 12:43  PM EST RP Workstation: [id number]   DG Chest Port 1 View Result Date: 08/07/2024 CLINICAL DATA:  Questionable sepsis. EXAM: PORTABLE CHEST 1 VIEW COMPARISON:  07/30/2024 and CT chest 01/16/2014. FINDINGS: Patient is rotated. Heart size stable. Mixed interstitial and airspace opacification in the left lower lobe. There may be mild hazy opacification in the right perihilar region. Small bilateral pleural effusions. Lungs are emphysematous. IMPRESSION: 1. Mixed interstitial and airspace opacification in the left lower lobe and possibly hazy opacification in the right perihilar region, findings which may be due to pneumonia. Aspiration not excluded. 2. Small bilateral pleural effusions. Electronically Signed   By: Newell Eke M.D.   On: 08/07/2024 10:20   DG Swallowing Func-Speech Pathology Result Date: 07/31/2024 Table formatting from the original result was not included. Modified Barium Swallow Study Patient Details Name: ANTARIO YASUDA MRN: [id number] Date of Birth: October 28, 1940 Today's Date: 07/31/2024 HPI/PMH: HPI: Patient is an 84 yo male presenting to the ED status post fall on 07/26/24.  Found to have R intertrochanteric femoral fracture, R IM nail completed on 1/8. SLP ordered 1/12 for swallow eval due to concern for PNA (R mid to lower lung field per CXR). Pt had a previous MBS in 2022 after having COVID with pharyngeal residue as well as penetration and aspiration (PAS 7, 8) with thin and nectar thick liquids. Dys 3 diet/honey thick liquids by cup recommended. PMH includes: dementia, Barrett's esophagus, COPD, GERD, hearing loss, CAD s/p CABG, HTN, chronic A. fib, DMII, asthma, hypothyroidism, CKD stage II Clinical Impression: Pt presents with an oropharyngeal dysphagia as well as suspected underlying esophageal component. He exhibits reduced airway protection (silent aspiration) and efficiency. He is further impacted by posture and benefits from a slightly reclined posture, so that his neck and pharynx are  in a more vertical position for intake. With this positioning, still recommend Dys 2 (finely chopped) diet and honey thick liquids. Pt has oral deficits that include a lot of repetitive lingual motion to attempt posterior transit, leaving collections of residue diffusely throughout his oral cavity. No overt anterior loss was noted though, and mastication seems to be fairly appropriate. Pharyngeally he has reduced base of tongue retraction, pharyngeal squeeze, and hyolaryngeal movement. His epiglottis has very little movement backwards, and no downward inversion. He has incomplete airway protection but also incomplete pharyngeal clearance, leaving collections of residue diffusely in his pharynx as well. When also factoring in his kyphotic posture, with pharynx almost completely horizontal, his positioning further allows for barium to enter his airway during the swallow and after. Aspiration is observed with thin and nectar thick liquids, but also suspected with initial sips of honey thick liquids (PAS 8, silent aspiration). This is difficult to differentiate given presence of persistent barium from thinner consistencies, but once he could cough and clear his laryngeal vestibule further, there were no  other instances of aspiration with honey thick liquids or solids. Pt did his relative best when positioned slightly reclined, so that his pharynx was in a more vertical position. Even in this position there is trace penetration of honey thick liquids and purees, but a cued cough is effective at clearing his laryngeal vestibule. Note that his esophagus had retained barium throughout the distal portion upon brief esophageal sweep. Factors that may increase risk of adverse event in presence of aspiration Noe & Lianne 2021): Factors that may increase risk of adverse event in presence of aspiration Noe & Lianne 2021): Respiratory or GI disease; Reduced cognitive function; Limited mobility; Frail or deconditioned;  Dependence for feeding and/or oral hygiene; Aspiration of thick, dense, and/or acidic materials Recommendations/Plan: Swallowing Evaluation Recommendations Swallowing Evaluation Recommendations Recommendations: PO diet PO Diet Recommendation: Dysphagia 2 (Finely chopped); Moderately thick liquids (Level 3, honey thick) Liquid Administration via: Straw; Cup Medication Administration: Crushed with puree Supervision: Staff to assist with self-feeding; Full supervision/cueing for swallowing strategies Swallowing strategies  : Slow rate; Small bites/sips; Hard cough after swallowing Postural changes: Stay upright 30-60 min after meals; Partially reclined for meals (partially recline pt until his neck is in a vertical position) Oral care recommendations: Oral care BID (2x/day) Caregiver Recommendations: Avoid jello, ice cream, thin soups, popsicles; Remove water  pitcher Treatment Plan Treatment Plan Treatment recommendations: Therapy as outlined in treatment plan below Follow-up recommendations: Skilled nursing-short term rehab (<3 hours/day) Functional status assessment: Patient has had a recent decline in their functional status and demonstrates the ability to make significant improvements in function in a reasonable and predictable amount of time. Treatment frequency: Min 2x/week Treatment duration: 2 weeks Interventions: Aspiration precaution training; Oropharyngeal exercises; Compensatory techniques; Patient/family education; Trials of upgraded texture/liquids; Diet toleration management by SLP; Respiratory muscle strength training Recommendations Recommendations for follow up therapy are one component of a multi-disciplinary discharge planning process, led by the attending physician.  Recommendations may be updated based on patient status, additional functional criteria and insurance authorization. Assessment: Orofacial Exam: Orofacial Exam Oral Cavity: Oral Hygiene: WFL Oral Cavity - Dentition: Edentulous Anatomy:  Anatomy: Other (Comment) (pt with kyphotic posture with pharynx horizontal at baseline) Boluses Administered: Boluses Administered Boluses Administered: Thin liquids (Level 0); Mildly thick liquids (Level 2, nectar thick); Moderately thick liquids (Level 3, honey thick); Puree; Solid  Oral Impairment Domain: Oral Impairment Domain Lip Closure: -- (difficult to visualize lips in frame but no overt anterior loss) Tongue control during bolus hold: Not tested Bolus preparation/mastication: Timely and efficient chewing and mashing Bolus transport/lingual motion: Repetitive/disorganized tongue motion Oral residue: Residue collection on oral structures Location of oral residue : Floor of mouth; Lateral sulci; Tongue Initiation of pharyngeal swallow : Pyriform sinuses  Pharyngeal Impairment Domain: Pharyngeal Impairment Domain Soft palate elevation: No bolus between soft palate (SP)/pharyngeal wall (PW) Laryngeal elevation: Partial superior movement of thyroid  cartilage/partial approximation of arytenoids to epiglottic petiole Anterior hyoid excursion: Partial anterior movement Epiglottic movement: Partial inversion Laryngeal vestibule closure: Incomplete, narrow column air/contrast in laryngeal vestibule Pharyngeal stripping wave : Present - diminished Pharyngeal contraction (A/P view only): N/A Pharyngoesophageal segment opening: Partial distention/partial duration, partial obstruction of flow Tongue base retraction: Narrow column of contrast or air between tongue base and PPW Pharyngeal residue: Collection of residue within or on pharyngeal structures Location of pharyngeal residue: Valleculae; Pyriform sinuses; Aryepiglottic folds; Pharyngeal wall; Diffuse (>3 areas)  Esophageal Impairment Domain: Esophageal Impairment Domain Esophageal clearance upright position: Esophageal retention Pill: Pill Consistency administered: -- (deferred) Penetration/Aspiration Scale  Score: Penetration/Aspiration Scale Score 1.  Material  does not enter airway: Solid 3.  Material enters airway, remains ABOVE vocal cords and not ejected out: Puree 8.  Material enters airway, passes BELOW cords without attempt by patient to eject out (silent aspiration) : Thin liquids (Level 0); Mildly thick liquids (Level 2, nectar thick); Moderately thick liquids (Level 3, honey thick) Compensatory Strategies: Compensatory Strategies Compensatory strategies: Yes Reclining posture: Effective; Ineffective Effective Reclining Posture: Moderately thick liquid (Level 3, honey thick); Puree; Solid Ineffective Reclining Posture: Mildly thick liquid (Level 2, nectar thick)   General Information: Caregiver present: No  Diet Prior to this Study: Regular; Thin liquids (Level 0)   Temperature : Normal   Respiratory Status: WFL   Supplemental O2: None (Room air)   History of Recent Intubation: Yes  Behavior/Cognition: Alert; Cooperative; Pleasant mood; Other (Comment) (HOH, does not have hearing aid here) Self-Feeding Abilities: Needs assist with self-feeding Baseline vocal quality/speech: Normal Volitional Cough: Able to elicit Volitional Swallow: Able to elicit Exam Limitations: Poor positioning; Limited visibility Goal Planning: Prognosis for improved oropharyngeal function: Good Barriers to Reach Goals: Cognitive deficits No data recorded Patient/Family Stated Goal: none stated Consulted and agree with results and recommendations: Patient Pain: Pain Assessment Pain Assessment: Faces Faces Pain Scale: 0 Breathing: 1 Negative Vocalization: 2 Facial Expression: 2 Body Language: 0 Consolability: 1 PAINAD Score: 6 End of Session: Start Time:SLP Start Time (ACUTE ONLY): 1505 Stop Time: SLP Stop Time (ACUTE ONLY): 1524 Time Calculation:SLP Time Calculation (min) (ACUTE ONLY): 19 min Charges: SLP Evaluations $ SLP Speech Visit: 1 Visit SLP Evaluations $BSS Swallow: 1 Procedure $MBS Swallow: 1 Procedure SLP visit diagnosis: SLP Visit Diagnosis: Dysphagia, oropharyngeal phase (R13.12)  Past Medical History: Past Medical History: Diagnosis Date  Allergy   Anxiety   Asthma   since 2013  Barrett's esophagus 03/09/2014  Bilateral lumbar radiculopathy   since 2013  CAD S/P percutaneous coronary angioplasty 1987  a. 1987 angioplasty and BMS- Cx; b. 1997 CABG x 4;  c. MYOVIEW  1/'05: ? Ischemia vs. artifact --> sent for cath - patent grafts.  Chronic atrial fibrillation (HCC)   a. Prev on amiodarone -->d/c 08/2016 in setting of recurrent AF, LFT abnormalities, and pulmonary symptoms;  b. CHA2DS2VASc = 4-->eliquis .  Chronic bronchitis (HCC)   since 2007  COPD (chronic obstructive pulmonary disease) (HCC)   DDD (degenerative disc disease)   Diverticulosis   Dyslipidemia, goal LDL below 70   Doing better off of Zocor, currently on Lescol   ED (erectile dysfunction)   Elevated LFTs   Esophageal reflux   GERD (gastroesophageal reflux disease)   Gout, unspecified   Hearing loss   Helicobacter pylori gastritis 07/2009  Herpes zoster 2009  Hypothyroidism   since 2010  Iron deficiency anemia, unspecified   Leg weakness   a. 09/2016 ABI's: R 1.05, L 0.98.  Other and unspecified hyperlipidemia   Other B-complex deficiencies   Personal history of colonic polyps   Prostatitis   chronic ulcerative  Pulmonary fibrosis (HCC)   and bronchiectasis, since 2015  Renal stone   Right  S/P CABG x 4 1997  a) LIMA-LAD, SVG to OM, SVG-dRCA-RPL; b) FALSE + MYOVIEW  --> CATH 1/'05: 100% LAD after widelly patent D1, all Cx OM branches 100%, pRCA 100%, SVG-PDA patent w/ retrograde filling of RPL, SVG-OM widely patent ~ normal OM, LIMA-LAD patent.  Spinal stenosis   ST elevation myocardial infarction (STEMI) of inferior wall, subsequent episode of care (HCC) 1987, 1997  a) PTCA of Cx;  b) CABG   Type II or unspecified type diabetes mellitus without mention of complication, not stated as uncontrolled   no meds  Ulcerative colitis, unspecified   Unspecified essential hypertension   Vitamin D  deficiency  Past Surgical History: Past Surgical  History: Procedure Laterality Date  BLEPHAROPLASTY Bilateral   for ptosis  CARDIAC CATHETERIZATION  08/17/2003  False positive stress test  grafts patent; RCA proximal 100% LAD 100% occlusion after normal D1 with 80% ostial as SP1; circumflex patent but OM1 on to all occluded; EF 50-55%  CARDIAC CATHETERIZATION  11/1995  Preop CABG: LAD 90% at D1, circumflex 100 and OM a 90% proximal, 80% distal  Carotid Duplex Doppler  12/23/2009  Right&Left ICAs 0-49%, mildly abnormal study,   CHOLECYSTECTOMY    COLONOSCOPY    CORONARY ARTERY BYPASS GRAFT  11/1995  LIMA-LAD, SVG to OM, SVG-dRCA-RPL  ESOPHAGOGASTRODUODENOSCOPY    FLEXIBLE SIGMOIDOSCOPY    INTRAMEDULLARY (IM) NAIL INTERTROCHANTERIC Right 07/27/2024  Procedure: FIXATION, FRACTURE, INTERTROCHANTERIC, WITH INTRAMEDULLARY ROD RIGHT HIP;  Surgeon: Celena Sharper, MD;  Location: [facility] OR;  Service: Orthopedics;  Laterality: Right;  NM MYOCAR PERF EJECTION FRACTION  07/03/2003  FALSE POSITIVE TEST; Bruce protocol, negative test with scintigraphic evidence of inferoapical scar, diaphragmatic attenuation, moderate ischemia.  NM MYOVIEW  LTD  2005  Questionable ischemia that is likely either infarct versus artifact; normal  TRANSTHORACIC ECHOCARDIOGRAM  12/2017  EF 55-60%.  Unable to assess diastolic function due to A. fib.  Moderate LA/RA dilation.  Mild MR. Leita SAILOR., M.A. CCC-SLP Acute Rehabilitation Services Office: [phone] Secure chat preferred 07/31/2024, 4:27 PM  DG CHEST PORT 1 VIEW Result Date: 07/30/2024 CLINICAL DATA:  Cough. EXAM: PORTABLE CHEST 1 VIEW COMPARISON:  07/26/2024, 01/16/2014. FINDINGS: The heart size and mediastinal contours are within normal limits. There is atherosclerotic calcification of the aorta. Emphysematous changes and interstitial prominence is present bilaterally. Airspace disease is noted in the mid to lower lung field on the right. Mild atelectasis is noted at the left lung base. There is a small right pleural effusion. No pneumothorax  is seen. No acute osseous abnormality. IMPRESSION: 1. Airspace disease in the mid to lower lung field on the right, suspicious for pneumonia. 2. Small right pleural effusion. 3. Emphysematous changes. Electronically Signed   By: Leita Birmingham M.D.   On: 07/30/2024 18:20   DG FEMUR PORT, MIN 2 VIEWS RIGHT Result Date: 07/27/2024 CLINICAL DATA:  Operative fixation of a right intertrochanteric fracture. EXAM: RIGHT FEMUR PORTABLE 2 VIEW COMPARISON:  C-arm images obtained earlier today and pelvis radiographs and CT dated 07/26/2024. FINDINGS: Intramedullary rod and screw fixation of the previously demonstrated right intertrochanteric fracture. There is valgus angulation as well as displacement of the greater and lesser trochanter fragments. Mild right hip degenerative changes. No new fractures or dislocation seen. Diffuse osteopenia. Extensive atheromatous arterial calcifications. Previously noted right knee degenerative changes. IMPRESSION: Intramedullary rod and screw fixation of the previously demonstrated right intertrochanteric fracture with valgus angulation and displacement of the greater and lesser trochanter fragments. Electronically Signed   By: Elspeth Bathe M.D.   On: 07/27/2024 14:06   DG FEMUR, MIN 2 VIEWS RIGHT Result Date: 07/27/2024 CLINICAL DATA:  Operative fixation of a right intertrochanteric fracture. EXAM: RIGHT FEMUR 2 VIEWS COMPARISON:  Right knee dated 07/26/2024. Portable pelvis radiographs dated 07/26/2024. FINDINGS: Eight C-arm images of the right femur demonstrate intramedullary rod and screw fixation of the previously demonstrated intertrochanteric fracture. Mild residual fracture displacement. IMPRESSION: Internal fixation of the previously demonstrated right intertrochanteric  fracture. Electronically Signed   By: Elspeth Bathe M.D.   On: 07/27/2024 14:03   DG C-Arm 1-60 Min-No Report Result Date: 07/27/2024 Fluoroscopy was utilized by the requesting physician.  No radiographic  interpretation.   DG C-Arm 1-60 Min-No Report Result Date: 07/27/2024 Fluoroscopy was utilized by the requesting physician.  No radiographic interpretation.   DG Knee Right Port Result Date: 07/26/2024 EXAM: 1 or 2 VIEW(S) XRAY OF THE RIGHT KNEE 07/26/2024 10:17:57 PM COMPARISON: 08/03/2011 CLINICAL HISTORY: [id number] Closed fracture of shaft of right femur, unspecified fracture morphology, initial encounter [facility]) [id number] FINDINGS: BONES AND JOINTS: No acute fracture. No malalignment. No significant joint effusion. Moderate to advanced tricompartmental degenerative changes. SOFT TISSUES: Diffuse vascular calcifications. IMPRESSION: 1. No acute findings. 2. Moderate to advanced tricompartmental degenerative changes. Electronically signed by: Franky Crease MD MD 07/26/2024 10:22 PM EST RP Workstation: [id number]   CT HEAD WO CONTRAST Result Date: 07/26/2024 CLINICAL DATA:  Trauma fall altered EXAM: CT HEAD WITHOUT CONTRAST CT CERVICAL SPINE WITHOUT CONTRAST TECHNIQUE: Multidetector CT imaging of the head and cervical spine was performed following the standard protocol without intravenous contrast. Multiplanar CT image reconstructions of the cervical spine were also generated. RADIATION DOSE REDUCTION: This exam was performed according to the departmental dose-optimization program which includes automated exposure control, adjustment of the mA and/or kV according to patient size and/or use of iterative reconstruction technique. COMPARISON:  CT brain 12/08/2023 FINDINGS: CT HEAD FINDINGS Brain: No acute territorial infarction, hemorrhage or intracranial mass. Atrophy and chronic small vessel ischemic changes of the white matter. Small left chronic parietooccipital infarct. The ventricles are non enlarged. Vascular: No hyperdense vessels. Vertebral and carotid vascular calcification Skull: None no fracture Sinuses/Orbits: No acute finding. Other: None CT CERVICAL SPINE FINDINGS Alignment: Trace anterolisthesis C4 on  C5, C5 on C6 and C7 on T1. Facet alignment is normal Skull base and vertebrae: No acute fracture. No primary bone lesion or focal pathologic process. Soft tissues and spinal canal: No prevertebral fluid or swelling. No visible canal hematoma. Disc levels: Multilevel degenerative changes. Advanced disc space narrowing C5-C6 and C6-C7 with moderate disc space narrowing at C7-T1. Multilevel hypertrophic facet degenerative changes with foraminal narrowing. Upper chest: Apical emphysema Other: None IMPRESSION: 1. No CT evidence for acute intracranial abnormality. Atrophy and chronic small vessel ischemic changes of the white matter. 2. Multilevel degenerative changes of the cervical spine. No acute osseous abnormality. Electronically Signed   By: Luke Bun M.D.   On: 07/26/2024 16:34   CT CERVICAL SPINE WO CONTRAST Result Date: 07/26/2024 CLINICAL DATA:  Trauma fall altered EXAM: CT HEAD WITHOUT CONTRAST CT CERVICAL SPINE WITHOUT CONTRAST TECHNIQUE: Multidetector CT imaging of the head and cervical spine was performed following the standard protocol without intravenous contrast. Multiplanar CT image reconstructions of the cervical spine were also generated. RADIATION DOSE REDUCTION: This exam was performed according to the departmental dose-optimization program which includes automated exposure control, adjustment of the mA and/or kV according to patient size and/or use of iterative reconstruction technique. COMPARISON:  CT brain 12/08/2023 FINDINGS: CT HEAD FINDINGS Brain: No acute territorial infarction, hemorrhage or intracranial mass. Atrophy and chronic small vessel ischemic changes of the white matter. Small left chronic parietooccipital infarct. The ventricles are non enlarged. Vascular: No hyperdense vessels. Vertebral and carotid vascular calcification Skull: None no fracture Sinuses/Orbits: No acute finding. Other: None CT CERVICAL SPINE FINDINGS Alignment: Trace anterolisthesis C4 on C5, C5 on C6 and C7  on T1. Facet alignment is normal Skull base and vertebrae:  No acute fracture. No primary bone lesion or focal pathologic process. Soft tissues and spinal canal: No prevertebral fluid or swelling. No visible canal hematoma. Disc levels: Multilevel degenerative changes. Advanced disc space narrowing C5-C6 and C6-C7 with moderate disc space narrowing at C7-T1. Multilevel hypertrophic facet degenerative changes with foraminal narrowing. Upper chest: Apical emphysema Other: None IMPRESSION: 1. No CT evidence for acute intracranial abnormality. Atrophy and chronic small vessel ischemic changes of the white matter. 2. Multilevel degenerative changes of the cervical spine. No acute osseous abnormality. Electronically Signed   By: Luke Bun M.D.   On: 07/26/2024 16:34   CT ABDOMEN PELVIS W CONTRAST Result Date: 07/26/2024 EXAM: CT ABDOMEN AND PELVIS WITH CONTRAST 07/26/2024 04:11:00 PM TECHNIQUE: CT of the abdomen and pelvis was performed with the administration of 75 mL of iohexol  (OMNIPAQUE ) 350 MG/ML injection. Multiplanar reformatted images are provided for review. Automated exposure control, iterative reconstruction, and/or weight-based adjustment of the mA/kV was utilized to reduce the radiation dose to as low as reasonably achievable. COMPARISON: CT abdomen and pelvis 01/32/2018. CLINICAL HISTORY: Polytrauma, blunt. FINDINGS: LOWER CHEST: No acute abnormality. LIVER: The liver is unremarkable. GALLBLADDER AND BILE DUCTS: Gallbladder surgically absent. No biliary ductal dilatation. SPLEEN: No acute abnormality. PANCREAS: No acute abnormality. ADRENAL GLANDS: No acute abnormality. KIDNEYS, URETERS AND BLADDER: Moderate bilateral hydroureteronephrosis to the level of the bladder without obstructing calculus. The bladder is markedly distended. There are cysts of the right kidney measuring up to 2.2 cm. Per consensus, no follow-up is needed for simple Bosniak type 1 and 2 renal cysts, unless the patient has a  malignancy history or risk factors. No perinephric or periureteral stranding. GI AND BOWEL: Stomach demonstrates no acute abnormality. There is a large amount of stool throughout the colon. The appendix is not visualized. There is no bowel obstruction. PERITONEUM AND RETROPERITONEUM: No ascites. No free air. VASCULATURE: Aorta is normal in caliber. There are atherosclerotic calcifications of the aorta and iliac arteries. LYMPH NODES: No lymphadenopathy. REPRODUCTIVE ORGANS: No acute abnormality. BONES AND SOFT TISSUES: The bones are diffusely osteopenic. There is a comminuted right femoral intertrochanteric fracture and proximal diaphyseal fracture which appears nondisplaced. No focal soft tissue abnormality. IMPRESSION: 1. Comminuted nondisplaced right femoral intertrochanteric fracture with associated proximal diaphyseal fracture. 2. Markedly distended bladder with moderate bilateral hydroureteronephrosis, likely related to urinary retention. 3. Large colonic stool burden. 4. Diffuse osteopenia. Electronically signed by: Greig Pique MD MD 07/26/2024 04:31 PM EST RP Workstation: [id number]   DG Chest Portable 1 View Result Date: 07/26/2024 CLINICAL DATA:  Trauma EXAM: PORTABLE CHEST 1 VIEW COMPARISON:  12/08/2023 FINDINGS: Post sternotomy changes. Emphysema and bronchitic changes. No acute airspace disease, pleural effusion or pneumothorax. Stable cardiomediastinal silhouette with aortic atherosclerosis. IMPRESSION: No active disease. Emphysema and bronchitic changes. Electronically Signed   By: Luke Bun M.D.   On: 07/26/2024 15:49   DG Forearm Left Result Date: 07/26/2024 CLINICAL DATA:  Mechanical fall EXAM: LEFT FOREARM - 2 VIEW COMPARISON:  None Available. FINDINGS: No fracture or malalignment.  No significant elbow effusion IMPRESSION: No acute osseous abnormality. Electronically Signed   By: Luke Bun M.D.   On: 07/26/2024 15:49   DG Pelvis Portable Result Date: 07/26/2024 CLINICAL DATA:  Hip  pain post fall EXAM: PORTABLE PELVIS 1-2 VIEWS COMPARISON:  08/24/2021 FINDINGS: SI joints are non widened. Pubic symphysis and rami appear intact. Femoral heads project in joint. Mild bilateral hip degenerative changes. Acute minimally displaced right intertrochanteric proximal femur fracture. IMPRESSION: Acute minimally displaced right  intertrochanteric proximal femur fracture. Electronically Signed   By: Luke Bun M.D.   On: 07/26/2024 15:48    Microbiology: Recent Results (from the past 240 hours)  Blood Culture (routine x 2)     Status: None (Preliminary result)   Collection Time: 08/07/24  9:13 AM   Specimen: BLOOD LEFT HAND  Result Value Ref Range Status   Specimen Description BLOOD LEFT HAND  Final   Special Requests   Final    Blood Culture results may not be optimal due to an inadequate volume of blood received in culture bottles   Culture   Final    NO GROWTH 3 DAYS Performed at [facility], [address], [place], [place] [address]    Report Status PENDING  Incomplete  Blood Culture (routine x 2)     Status: None (Preliminary result)   Collection Time: 08/07/24  9:49 AM   Specimen: BLOOD RIGHT FOREARM  Result Value Ref Range Status   Specimen Description BLOOD RIGHT FOREARM  Final   Special Requests Blood Culture adequate volume  Final   Culture   Final    NO GROWTH 3 DAYS Performed at [facility], [address], [place], [place] [address]    Report Status PENDING  Incomplete  Resp panel by RT-PCR (RSV, Flu A&B, Covid) Anterior Nasal Swab     Status: None   Collection Time: 08/07/24  9:49 AM   Specimen: Anterior Nasal Swab  Result Value Ref Range Status   SARS Coronavirus 2 by RT PCR NEGATIVE NEGATIVE Final    Comment: (NOTE) SARS-CoV-2 target nucleic acids are NOT DETECTED.  The SARS-CoV-2 RNA is generally detectable in upper respiratory specimens during the acute phase of infection. The lowest concentration of SARS-CoV-2 viral copies  this assay can detect is 138 copies/mL. A negative result does not preclude SARS-Cov-2 infection and should not be used as the sole basis for treatment or other patient management decisions. A negative result may occur with  improper specimen collection/handling, submission of specimen other than nasopharyngeal swab, presence of viral mutation(s) within the areas targeted by this assay, and inadequate number of viral copies(<138 copies/mL). A negative result must be combined with clinical observations, patient history, and epidemiological information. The expected result is Negative.  Fact Sheet for Patients:  bloggercourse.com  Fact Sheet for Healthcare Providers:  seriousbroker.it  This test is no t yet approved or cleared by the [place]  FDA and  has been authorized for detection and/or diagnosis of SARS-CoV-2 by FDA under an Emergency Use Authorization (EUA). This EUA will remain  in effect (meaning this test can be used) for the duration of the COVID-19 declaration under Section 564(b)(1) of the Act, 21 U.S.C.section 360bbb-3(b)(1), unless the authorization is terminated  or revoked sooner.       Influenza A by PCR NEGATIVE NEGATIVE Final   Influenza B by PCR NEGATIVE NEGATIVE Final    Comment: (NOTE) The Xpert Xpress SARS-CoV-2/FLU/RSV plus assay is intended as an aid in the diagnosis of influenza from Nasopharyngeal swab specimens and should not be used as a sole basis for treatment. Nasal washings and aspirates are unacceptable for Xpert Xpress SARS-CoV-2/FLU/RSV testing.  Fact Sheet for Patients: bloggercourse.com  Fact Sheet for Healthcare Providers: seriousbroker.it  This test is not yet approved or cleared by the [place]  FDA and has been authorized for detection and/or diagnosis of SARS-CoV-2 by FDA under an Emergency Use Authorization (EUA). This EUA  will remain in effect (meaning this  test can be used) for the duration of the COVID-19 declaration under Section 564(b)(1) of the Act, 21 U.S.C. section 360bbb-3(b)(1), unless the authorization is terminated or revoked.     Resp Syncytial Virus by PCR NEGATIVE NEGATIVE Final    Comment: (NOTE) Fact Sheet for Patients: bloggercourse.com  Fact Sheet for Healthcare Providers: seriousbroker.it  This test is not yet approved or cleared by the [place]  FDA and has been authorized for detection and/or diagnosis of SARS-CoV-2 by FDA under an Emergency Use Authorization (EUA). This EUA will remain in effect (meaning this test can be used) for the duration of the COVID-19 declaration under Section 564(b)(1) of the Act, 21 U.S.C. section 360bbb-3(b)(1), unless the authorization is terminated or revoked.  Performed at [facility], [address] [place], [place] [address]   Urine Culture     Status: Abnormal   Collection Time: 08/07/24 11:50 AM   Specimen: Urine, Random  Result Value Ref Range Status   Specimen Description   Final    URINE, RANDOM Performed at [facility], [address], [place], [place] [address]    Special Requests   Final    NONE Reflexed from [id number] Performed at [facility], [address] [place], [place] [address]    Culture MULTIPLE SPECIES PRESENT, SUGGEST RECOLLECTION (A)  Final   Report Status 08/08/2024 FINAL  Final  MRSA Next Gen by PCR, Nasal     Status: None   Collection Time: 08/08/24  7:38 AM   Specimen: Nasal Mucosa; Nasal Swab  Result Value Ref Range Status   MRSA by PCR Next Gen NOT DETECTED NOT DETECTED Final    Comment: (NOTE) The GeneXpert MRSA Assay (FDA approved for NASAL specimens only), is one component of a comprehensive MRSA colonization surveillance program. It is not intended to diagnose MRSA infection nor to guide or monitor treatment for MRSA  infections. Test performance is not FDA approved in patients less than 28 years old. Performed at [facility], [address], [place], [place] [address]     Time spent: 35 minutes  Signed: Suad Autrey, DO 09-09-24   "

## 2024-08-20 NOTE — Progress Notes (Unsigned)
 "  07-Sep-2024 7:35 AM   Timothy Mclean Jun 22, 1941 [id number]   HPI: 84 y.o. male here for initial evaluation of ***    PMH: Past Medical History:  Diagnosis Date   Allergy    Anxiety    Asthma    since 2013   Barrett's esophagus 03/09/2014   Bilateral lumbar radiculopathy    since 2013   CAD S/P percutaneous coronary angioplasty 1987   a. 1987 angioplasty and BMS- Cx; b. 1997 CABG x 4;  c. MYOVIEW  1/'05: ? Ischemia vs. artifact --> sent for cath - patent grafts.   Chronic atrial fibrillation (HCC)    a. Prev on amiodarone -->d/c 08/2016 in setting of recurrent AF, LFT abnormalities, and pulmonary symptoms;  b. CHA2DS2VASc = 4-->eliquis .   Chronic bronchitis (HCC)    since 2007   COPD (chronic obstructive pulmonary disease) (HCC)    DDD (degenerative disc disease)    Diverticulosis    Dyslipidemia, goal LDL below 70    Doing better off of Zocor, currently on Lescol    ED (erectile dysfunction)    Elevated LFTs    Esophageal reflux    GERD (gastroesophageal reflux disease)    Gout, unspecified    Hearing loss    Helicobacter pylori gastritis 07/2009   Herpes zoster 2009   Hypothyroidism    since 2010   Iron deficiency anemia, unspecified    Leg weakness    a. 09/2016 ABI's: R 1.05, L 0.98.   Other and unspecified hyperlipidemia    Other B-complex deficiencies    Personal history of colonic polyps    Prostatitis    chronic ulcerative   Pulmonary fibrosis (HCC)    and bronchiectasis, since 2015   Renal stone    Right   S/P CABG x 4 1997   a) LIMA-LAD, SVG to OM, SVG-dRCA-RPL; b) FALSE + MYOVIEW  --> CATH 1/'05: 100% LAD after widelly patent D1, all Cx OM branches 100%, pRCA 100%, SVG-PDA patent w/ retrograde filling of RPL, SVG-OM widely patent ~ normal OM, LIMA-LAD patent.   Spinal stenosis    ST elevation myocardial infarction (STEMI) of inferior wall, subsequent episode of care (HCC) 1987, 1997   a) PTCA of Cx; b) CABG    Type II or unspecified type diabetes  mellitus without mention of complication, not stated as uncontrolled    no meds   Ulcerative colitis, unspecified    Unspecified essential hypertension    Vitamin D  deficiency     Surgical History: Past Surgical History:  Procedure Laterality Date   BLEPHAROPLASTY Bilateral    for ptosis   CARDIAC CATHETERIZATION  08/17/2003   False positive stress test  grafts patent; RCA proximal 100% LAD 100% occlusion after normal D1 with 80% ostial as SP1; circumflex patent but OM1 on to all occluded; EF 50-55%   CARDIAC CATHETERIZATION  11/1995   Preop CABG: LAD 90% at D1, circumflex 100 and OM a 90% proximal, 80% distal   Carotid Duplex Doppler  12/23/2009   Right&Left ICAs 0-49%, mildly abnormal study,    CHOLECYSTECTOMY     COLONOSCOPY     CORONARY ARTERY BYPASS GRAFT  11/1995   LIMA-LAD, SVG to OM, SVG-dRCA-RPL   ESOPHAGOGASTRODUODENOSCOPY     FLEXIBLE SIGMOIDOSCOPY     INTRAMEDULLARY (IM) NAIL INTERTROCHANTERIC Right 07/27/2024   Procedure: FIXATION, FRACTURE, INTERTROCHANTERIC, WITH INTRAMEDULLARY ROD RIGHT HIP;  Surgeon: Celena Sharper, MD;  Location: [facility] OR;  Service: Orthopedics;  Laterality: Right;   NM MYOCAR PERF EJECTION FRACTION  07/03/2003   FALSE POSITIVE TEST; Bruce protocol, negative test with scintigraphic evidence of inferoapical scar, diaphragmatic attenuation, moderate ischemia.   NM MYOVIEW  LTD  2005   Questionable ischemia that is likely either infarct versus artifact; normal   TRANSTHORACIC ECHOCARDIOGRAM  12/2017   EF 55-60%.  Unable to assess diastolic function due to A. fib.  Moderate LA/RA dilation.  Mild MR.    Family History: Family History  Problem Relation Age of Onset   Diabetes Mother    Congestive Heart Failure Mother    Dementia Father    Pneumonia Father        died of this at age 54, hx of bronchiectasis   Heart disease Sister    Diabetes Sister    Lung cancer Brother        died at age 75   Hypertension Sister    CVA Sister    Colon cancer Neg  Hx    Esophageal cancer Neg Hx    Rectal cancer Neg Hx    Stomach cancer Neg Hx     Social History:  reports that he quit smoking about 39 years ago. His smoking use included cigarettes. He started smoking about 72 years ago. He has a 32 pack-year smoking history. He has never used smokeless tobacco. He reports that he does not drink alcohol  and does not use drugs.      Physical Exam: There were no vitals taken for this visit.   Constitutional:  Alert and oriented, No acute distress. Cardiovascular: No clubbing, cyanosis, or edema. Respiratory: Normal respiratory effort, no increased work of breathing. GI: Nondistended GU: *** Skin: No rashes, bruises or suspicious lesions. Neurologic: Grossly intact, no focal deficits, moving all 4 extremities. Psychiatric: Normal mood and affect.  Laboratory Data: ***   Pertinent Imaging: I have personally viewed and interpreted the ***.    Assessment & Plan:    There are no diagnoses linked to this encounter.    Timothy Skye, MD 2024-08-27  [facility] [address] [place], [place] [address] [phone] "

## 2024-08-20 DEATH — deceased
